# Patient Record
Sex: Female | Born: 1935
Health system: Southern US, Community
[De-identification: ages and names within clinical notes are randomized; demographics above are authoritative.]

## PROBLEM LIST (undated history)

## (undated) DIAGNOSIS — G40309 Generalized idiopathic epilepsy and epileptic syndromes, not intractable, without status epilepticus: Secondary | ICD-10-CM

## (undated) DIAGNOSIS — R000 Tachycardia, unspecified: Secondary | ICD-10-CM

## (undated) DIAGNOSIS — R42 Dizziness and giddiness: Secondary | ICD-10-CM

## (undated) DIAGNOSIS — C801 Malignant (primary) neoplasm, unspecified: Secondary | ICD-10-CM

## (undated) DIAGNOSIS — C439 Malignant melanoma of skin, unspecified: Secondary | ICD-10-CM

## (undated) DIAGNOSIS — R569 Unspecified convulsions: Secondary | ICD-10-CM

## (undated) DIAGNOSIS — G4700 Insomnia, unspecified: Secondary | ICD-10-CM

## (undated) DIAGNOSIS — F1010 Alcohol abuse, uncomplicated: Secondary | ICD-10-CM

## (undated) DIAGNOSIS — F329 Major depressive disorder, single episode, unspecified: Secondary | ICD-10-CM

## (undated) DIAGNOSIS — F17200 Nicotine dependence, unspecified, uncomplicated: Secondary | ICD-10-CM

## (undated) DIAGNOSIS — E785 Hyperlipidemia, unspecified: Secondary | ICD-10-CM

## (undated) DIAGNOSIS — C169 Malignant neoplasm of stomach, unspecified: Secondary | ICD-10-CM

## (undated) DIAGNOSIS — N951 Menopausal and female climacteric states: Secondary | ICD-10-CM

## (undated) DIAGNOSIS — R410 Disorientation, unspecified: Secondary | ICD-10-CM

## (undated) DIAGNOSIS — R609 Edema, unspecified: Secondary | ICD-10-CM

## (undated) DIAGNOSIS — I749 Embolism and thrombosis of unspecified artery: Secondary | ICD-10-CM

## (undated) DIAGNOSIS — K219 Gastro-esophageal reflux disease without esophagitis: Secondary | ICD-10-CM

## (undated) DIAGNOSIS — R3915 Urgency of urination: Secondary | ICD-10-CM

## (undated) DIAGNOSIS — J449 Chronic obstructive pulmonary disease, unspecified: Secondary | ICD-10-CM

## (undated) DIAGNOSIS — I10 Essential (primary) hypertension: Secondary | ICD-10-CM

## (undated) DIAGNOSIS — M869 Osteomyelitis, unspecified: Secondary | ICD-10-CM

## (undated) DIAGNOSIS — F419 Anxiety disorder, unspecified: Secondary | ICD-10-CM

## (undated) DIAGNOSIS — I739 Peripheral vascular disease, unspecified: Secondary | ICD-10-CM

## (undated) DIAGNOSIS — I4891 Unspecified atrial fibrillation: Secondary | ICD-10-CM

## (undated) HISTORY — DX: Chronic obstructive pulmonary disease, unspecified: J44.9

## (undated) HISTORY — DX: Tachycardia, unspecified: R00.0

## (undated) HISTORY — DX: Disorientation, unspecified: R41.0

## (undated) HISTORY — DX: Major depressive disorder, single episode, unspecified: F32.9

## (undated) HISTORY — DX: Alcohol abuse, uncomplicated: F10.10

## (undated) HISTORY — DX: Urgency of urination: R39.15

## (undated) HISTORY — DX: Generalized idiopathic epilepsy and epileptic syndromes, not intractable, without status epilepticus: G40.309

## (undated) HISTORY — PX: BLADDER REPAIR: SHX76

## (undated) HISTORY — DX: Malignant neoplasm of stomach, unspecified: C16.9

## (undated) HISTORY — DX: Menopausal and female climacteric states: N95.1

## (undated) HISTORY — DX: Insomnia, unspecified: G47.00

## (undated) HISTORY — PX: HEMORRHOID SURGERY: SHX153

## (undated) HISTORY — DX: Dizziness and giddiness: R42

## (undated) HISTORY — DX: Nicotine dependence, unspecified, uncomplicated: F17.200

## (undated) HISTORY — PX: OTHER SURGICAL HISTORY: SHX169

## (undated) HISTORY — DX: Edema, unspecified: R60.9

## (undated) HISTORY — DX: Unspecified atrial fibrillation: I48.91

## (undated) HISTORY — PX: VOCAL CORD INJECTION: SHX2663

---

## 1961-09-29 HISTORY — PX: ABDOMINAL HYSTERECTOMY: SHX81

## 1993-09-29 HISTORY — PX: OTHER SURGICAL HISTORY: SHX169

## 1993-09-29 HISTORY — PX: COLON RESECTION: SHX5231

## 1994-09-29 DIAGNOSIS — I749 Embolism and thrombosis of unspecified artery: Secondary | ICD-10-CM

## 1994-09-29 HISTORY — PX: OTHER SURGICAL HISTORY: SHX169

## 1994-09-29 HISTORY — DX: Embolism and thrombosis of unspecified artery: I74.9

## 1995-09-30 HISTORY — PX: OTHER SURGICAL HISTORY: SHX169

## 2004-05-07 HISTORY — PX: OTHER SURGICAL HISTORY: SHX169

## 2004-09-06 HISTORY — PX: OTHER SURGICAL HISTORY: SHX169

## 2004-09-29 HISTORY — PX: OTHER SURGICAL HISTORY: SHX169

## 2004-12-05 HISTORY — PX: OTHER SURGICAL HISTORY: SHX169

## 2005-03-27 ENCOUNTER — Emergency Department (HOSPITAL_COMMUNITY): Admission: EM | Admit: 2005-03-27 | Discharge: 2005-03-27 | Payer: Self-pay | Admitting: Emergency Medicine

## 2005-03-27 HISTORY — PX: OTHER SURGICAL HISTORY: SHX169

## 2005-04-09 ENCOUNTER — Encounter: Admission: RE | Admit: 2005-04-09 | Discharge: 2005-04-09 | Payer: Self-pay | Admitting: Orthopedic Surgery

## 2005-04-10 ENCOUNTER — Ambulatory Visit (HOSPITAL_BASED_OUTPATIENT_CLINIC_OR_DEPARTMENT_OTHER): Admission: RE | Admit: 2005-04-10 | Discharge: 2005-04-10 | Payer: Self-pay | Admitting: Orthopedic Surgery

## 2005-04-10 ENCOUNTER — Ambulatory Visit (HOSPITAL_COMMUNITY): Admission: RE | Admit: 2005-04-10 | Discharge: 2005-04-10 | Payer: Self-pay | Admitting: Orthopedic Surgery

## 2005-04-24 DIAGNOSIS — R42 Dizziness and giddiness: Secondary | ICD-10-CM

## 2005-04-24 HISTORY — DX: Dizziness and giddiness: R42

## 2007-10-04 HISTORY — PX: OTHER SURGICAL HISTORY: SHX169

## 2008-12-22 ENCOUNTER — Observation Stay (HOSPITAL_COMMUNITY): Admission: EM | Admit: 2008-12-22 | Discharge: 2008-12-22 | Payer: Self-pay | Admitting: Emergency Medicine

## 2008-12-22 DIAGNOSIS — M869 Osteomyelitis, unspecified: Secondary | ICD-10-CM

## 2008-12-22 HISTORY — DX: Osteomyelitis, unspecified: M86.9

## 2009-01-02 ENCOUNTER — Encounter: Admission: RE | Admit: 2009-01-02 | Discharge: 2009-01-02 | Payer: Self-pay | Admitting: Neurology

## 2009-03-21 ENCOUNTER — Emergency Department (HOSPITAL_COMMUNITY): Admission: EM | Admit: 2009-03-21 | Discharge: 2009-03-22 | Payer: Self-pay | Admitting: Emergency Medicine

## 2009-03-22 ENCOUNTER — Inpatient Hospital Stay (HOSPITAL_COMMUNITY): Admission: RE | Admit: 2009-03-22 | Discharge: 2009-03-22 | Payer: Self-pay | Admitting: *Deleted

## 2009-03-22 ENCOUNTER — Ambulatory Visit: Payer: Self-pay | Admitting: *Deleted

## 2010-02-05 ENCOUNTER — Emergency Department (HOSPITAL_COMMUNITY): Admission: EM | Admit: 2010-02-05 | Discharge: 2010-02-05 | Payer: Self-pay | Admitting: Emergency Medicine

## 2010-04-01 ENCOUNTER — Emergency Department (HOSPITAL_COMMUNITY): Admission: EM | Admit: 2010-04-01 | Discharge: 2010-04-01 | Payer: Self-pay | Admitting: Emergency Medicine

## 2010-06-08 ENCOUNTER — Emergency Department (HOSPITAL_COMMUNITY)
Admission: EM | Admit: 2010-06-08 | Discharge: 2010-06-08 | Payer: Self-pay | Source: Home / Self Care | Admitting: Emergency Medicine

## 2010-08-16 ENCOUNTER — Emergency Department (HOSPITAL_COMMUNITY): Admission: EM | Admit: 2010-08-16 | Discharge: 2010-08-16 | Payer: Self-pay | Admitting: Emergency Medicine

## 2010-08-21 ENCOUNTER — Emergency Department (HOSPITAL_COMMUNITY): Admission: EM | Admit: 2010-08-21 | Discharge: 2010-08-21 | Payer: Self-pay | Admitting: Emergency Medicine

## 2010-10-23 ENCOUNTER — Other Ambulatory Visit: Payer: Self-pay | Admitting: General Surgery

## 2010-10-23 ENCOUNTER — Other Ambulatory Visit
Admission: RE | Admit: 2010-10-23 | Discharge: 2010-10-23 | Payer: Self-pay | Source: Home / Self Care | Admitting: General Surgery

## 2010-10-29 ENCOUNTER — Other Ambulatory Visit (HOSPITAL_COMMUNITY): Payer: Self-pay | Admitting: General Surgery

## 2010-10-29 DIAGNOSIS — C439 Malignant melanoma of skin, unspecified: Secondary | ICD-10-CM

## 2010-10-31 ENCOUNTER — Other Ambulatory Visit (HOSPITAL_COMMUNITY): Payer: Self-pay | Admitting: General Surgery

## 2010-10-31 ENCOUNTER — Encounter (HOSPITAL_COMMUNITY): Payer: Self-pay

## 2010-10-31 ENCOUNTER — Ambulatory Visit (HOSPITAL_COMMUNITY)
Admission: RE | Admit: 2010-10-31 | Discharge: 2010-10-31 | Disposition: A | Discharge status: Home / Self Care | Payer: Medicare Other | Source: Ambulatory Visit | Attending: General Surgery | Admitting: General Surgery

## 2010-10-31 DIAGNOSIS — Z85038 Personal history of other malignant neoplasm of large intestine: Secondary | ICD-10-CM | POA: Insufficient documentation

## 2010-10-31 DIAGNOSIS — C439 Malignant melanoma of skin, unspecified: Secondary | ICD-10-CM

## 2010-10-31 DIAGNOSIS — C4370 Malignant melanoma of unspecified lower limb, including hip: Secondary | ICD-10-CM | POA: Insufficient documentation

## 2010-10-31 DIAGNOSIS — C774 Secondary and unspecified malignant neoplasm of inguinal and lower limb lymph nodes: Secondary | ICD-10-CM | POA: Insufficient documentation

## 2010-10-31 DIAGNOSIS — Z9221 Personal history of antineoplastic chemotherapy: Secondary | ICD-10-CM | POA: Insufficient documentation

## 2010-10-31 DIAGNOSIS — J984 Other disorders of lung: Secondary | ICD-10-CM | POA: Insufficient documentation

## 2010-10-31 HISTORY — DX: Malignant (primary) neoplasm, unspecified: C80.1

## 2010-10-31 LAB — GLUCOSE, CAPILLARY: Glucose-Capillary: 100 mg/dL — ABNORMAL HIGH (ref 70–99)

## 2010-10-31 MED ORDER — FLUDEOXYGLUCOSE F - 18 (FDG) INJECTION: 18.1000 | Freq: Once | INTRAVENOUS | Status: AC | PRN

## 2010-11-11 DIAGNOSIS — F329 Major depressive disorder, single episode, unspecified: Secondary | ICD-10-CM

## 2010-11-11 DIAGNOSIS — F1010 Alcohol abuse, uncomplicated: Secondary | ICD-10-CM

## 2010-11-11 DIAGNOSIS — C169 Malignant neoplasm of stomach, unspecified: Secondary | ICD-10-CM

## 2010-11-11 DIAGNOSIS — N951 Menopausal and female climacteric states: Secondary | ICD-10-CM

## 2010-11-11 DIAGNOSIS — I4891 Unspecified atrial fibrillation: Secondary | ICD-10-CM

## 2010-11-11 DIAGNOSIS — G4700 Insomnia, unspecified: Secondary | ICD-10-CM

## 2010-11-11 HISTORY — DX: Malignant neoplasm of stomach, unspecified: C16.9

## 2010-11-11 HISTORY — DX: Menopausal and female climacteric states: N95.1

## 2010-11-11 HISTORY — DX: Unspecified atrial fibrillation: I48.91

## 2010-11-11 HISTORY — DX: Major depressive disorder, single episode, unspecified: F32.9

## 2010-11-11 HISTORY — DX: Insomnia, unspecified: G47.00

## 2010-11-11 HISTORY — DX: Alcohol abuse, uncomplicated: F10.10

## 2010-11-15 HISTORY — PX: OTHER SURGICAL HISTORY: SHX169

## 2010-12-10 LAB — URINALYSIS, ROUTINE W REFLEX MICROSCOPIC
Glucose, UA: NEGATIVE mg/dL
Ketones, ur: NEGATIVE mg/dL
Nitrite: NEGATIVE
pH: 6.5 (ref 5.0–8.0)

## 2010-12-10 LAB — DIFFERENTIAL
Basophils Relative: 0 % (ref 0–1)
Eosinophils Absolute: 0 10*3/uL (ref 0.0–0.7)
Eosinophils Relative: 0 % (ref 0–5)
Lymphs Abs: 1.2 10*3/uL (ref 0.7–4.0)
Monocytes Absolute: 0.4 10*3/uL (ref 0.1–1.0)
Monocytes Relative: 4 % (ref 3–12)
Neutrophils Relative %: 86 % — ABNORMAL HIGH (ref 43–77)

## 2010-12-10 LAB — BASIC METABOLIC PANEL
BUN: 10 mg/dL (ref 6–23)
Chloride: 102 mEq/L (ref 96–112)
Creatinine, Ser: 0.79 mg/dL (ref 0.4–1.2)
GFR calc Af Amer: >60 mL/min (ref >60)
GFR calc non Af Amer: >60 mL/min (ref >60)
Potassium: 4.2 mEq/L (ref 3.5–5.1)

## 2010-12-10 LAB — CBC
HCT: 44.2 % (ref 36.0–46.0)
Hemoglobin: 14.8 g/dL (ref 12.0–15.0)
MCH: 30.7 pg (ref 26.0–34.0)
MCHC: 33.5 g/dL (ref 30.0–36.0)
MCV: 91.4 fL (ref 78.0–100.0)
RBC: 4.83 MIL/uL (ref 3.87–5.11)

## 2010-12-12 LAB — URINALYSIS, ROUTINE W REFLEX MICROSCOPIC
Bilirubin Urine: NEGATIVE
Glucose, UA: NEGATIVE mg/dL
Hgb urine dipstick: NEGATIVE
Ketones, ur: NEGATIVE mg/dL
Protein, ur: NEGATIVE mg/dL
Urobilinogen, UA: 1 mg/dL (ref 0.0–1.0)

## 2010-12-12 LAB — COMPREHENSIVE METABOLIC PANEL
ALT: 13 U/L (ref 0–35)
AST: 19 U/L (ref 0–37)
Albumin: 4.1 g/dL (ref 3.5–5.2)
CO2: 26 mEq/L (ref 19–32)
Calcium: 9.2 mg/dL (ref 8.4–10.5)
Chloride: 104 mEq/L (ref 96–112)
Creatinine, Ser: 0.87 mg/dL (ref 0.4–1.2)
GFR calc Af Amer: >60 mL/min (ref >60)
GFR calc non Af Amer: >60 mL/min (ref >60)
Sodium: 139 mEq/L (ref 135–145)
Total Bilirubin: 0.6 mg/dL (ref 0.3–1.2)

## 2010-12-12 LAB — CBC
Hemoglobin: 15.1 g/dL — ABNORMAL HIGH (ref 12.0–15.0)
MCH: 30.3 pg (ref 26.0–34.0)
Platelets: 265 10*3/uL (ref 150–400)
RBC: 4.98 MIL/uL (ref 3.87–5.11)

## 2010-12-12 LAB — URINE CULTURE: Colony Count: 10000

## 2010-12-12 LAB — DIFFERENTIAL
Eosinophils Absolute: 0 10*3/uL (ref 0.0–0.7)
Eosinophils Relative: 0 % (ref 0–5)
Lymphocytes Relative: 14 % (ref 12–46)
Lymphs Abs: 1.3 10*3/uL (ref 0.7–4.0)
Monocytes Absolute: 0.5 10*3/uL (ref 0.1–1.0)

## 2010-12-12 LAB — CULTURE, BLOOD (ROUTINE X 2): Culture: NO GROWTH

## 2010-12-12 LAB — LACTIC ACID, PLASMA: Lactic Acid, Venous: 1.3 mmol/L (ref 0.5–2.2)

## 2010-12-12 LAB — APTT: aPTT: 27 seconds (ref 24–37)

## 2010-12-17 LAB — POCT I-STAT, CHEM 8
BUN: 15 mg/dL (ref 6–23)
Calcium, Ion: 1.16 mmol/L (ref 1.12–1.32)
Chloride: 105 mEq/L (ref 96–112)
Creatinine, Ser: 0.9 mg/dL (ref 0.4–1.2)
Glucose, Bld: 109 mg/dL — ABNORMAL HIGH (ref 70–99)
HCT: 43 % (ref 36.0–46.0)
Potassium: 4 mEq/L (ref 3.5–5.1)

## 2011-01-06 LAB — RAPID URINE DRUG SCREEN, HOSP PERFORMED
Amphetamines: NOT DETECTED
Benzodiazepines: POSITIVE — AB
Cocaine: NOT DETECTED
Tetrahydrocannabinol: NOT DETECTED

## 2011-01-06 LAB — CBC
HCT: 45.4 % (ref 36.0–46.0)
MCV: 90.8 fL (ref 78.0–100.0)
Platelets: 282 10*3/uL (ref 150–400)
RDW: 15.2 % (ref 11.5–15.5)

## 2011-01-06 LAB — URINALYSIS, ROUTINE W REFLEX MICROSCOPIC
Glucose, UA: NEGATIVE mg/dL
Ketones, ur: NEGATIVE mg/dL
Protein, ur: NEGATIVE mg/dL
Urobilinogen, UA: 0.2 mg/dL (ref 0.0–1.0)

## 2011-01-06 LAB — DIFFERENTIAL
Basophils Absolute: 0.1 10*3/uL (ref 0.0–0.1)
Eosinophils Absolute: 0 10*3/uL (ref 0.0–0.7)
Eosinophils Relative: 0 % (ref 0–5)
Lymphocytes Relative: 23 % (ref 12–46)

## 2011-01-06 LAB — BASIC METABOLIC PANEL
BUN: 7 mg/dL (ref 6–23)
Creatinine, Ser: 1.01 mg/dL (ref 0.4–1.2)
GFR calc non Af Amer: 54 mL/min — ABNORMAL LOW (ref >60)
Glucose, Bld: 102 mg/dL — ABNORMAL HIGH (ref 70–99)
Potassium: 4 mEq/L (ref 3.5–5.1)

## 2011-01-09 LAB — CBC
Platelets: 267 10*3/uL (ref 150–400)
WBC: 9.1 10*3/uL (ref 4.0–10.5)

## 2011-01-09 LAB — DIFFERENTIAL
Lymphocytes Relative: 22 % (ref 12–46)
Lymphs Abs: 2 10*3/uL (ref 0.7–4.0)
Neutro Abs: 6.2 10*3/uL (ref 1.7–7.7)
Neutrophils Relative %: 68 % (ref 43–77)

## 2011-01-09 LAB — SEDIMENTATION RATE: Sed Rate: 11 mm/hr (ref 0–22)

## 2011-01-31 ENCOUNTER — Emergency Department (INDEPENDENT_AMBULATORY_CARE_PROVIDER_SITE_OTHER): Payer: Medicare Other

## 2011-01-31 ENCOUNTER — Emergency Department (HOSPITAL_BASED_OUTPATIENT_CLINIC_OR_DEPARTMENT_OTHER)
Admission: EM | Admit: 2011-01-31 | Discharge: 2011-01-31 | Disposition: A | Discharge status: Home / Self Care | Payer: Medicare Other | Attending: Emergency Medicine | Admitting: Emergency Medicine

## 2011-01-31 DIAGNOSIS — G319 Degenerative disease of nervous system, unspecified: Secondary | ICD-10-CM | POA: Insufficient documentation

## 2011-01-31 DIAGNOSIS — Z79899 Other long term (current) drug therapy: Secondary | ICD-10-CM | POA: Insufficient documentation

## 2011-01-31 DIAGNOSIS — R569 Unspecified convulsions: Secondary | ICD-10-CM | POA: Insufficient documentation

## 2011-01-31 LAB — BASIC METABOLIC PANEL
BUN: 10 mg/dL (ref 6–23)
CO2: 24 mEq/L (ref 19–32)
Chloride: 102 mEq/L (ref 96–112)
Creatinine, Ser: 0.7 mg/dL (ref 0.4–1.2)
Glucose, Bld: 125 mg/dL — ABNORMAL HIGH (ref 70–99)

## 2011-01-31 LAB — CBC
HCT: 39.7 % (ref 36.0–46.0)
Hemoglobin: 13.2 g/dL (ref 12.0–15.0)
MCH: 28.1 pg (ref 26.0–34.0)
MCHC: 33.2 g/dL (ref 30.0–36.0)

## 2011-01-31 LAB — URINALYSIS, ROUTINE W REFLEX MICROSCOPIC
Glucose, UA: NEGATIVE mg/dL
Hgb urine dipstick: NEGATIVE
Ketones, ur: NEGATIVE mg/dL
Protein, ur: NEGATIVE mg/dL

## 2011-01-31 LAB — GLUCOSE, CAPILLARY: Glucose-Capillary: 130 mg/dL — ABNORMAL HIGH (ref 70–99)

## 2011-01-31 LAB — DIFFERENTIAL
Basophils Absolute: 0 10*3/uL (ref 0.0–0.1)
Eosinophils Relative: 0 % (ref 0–5)
Lymphocytes Relative: 7 % — ABNORMAL LOW (ref 12–46)
Neutro Abs: 8.7 10*3/uL — ABNORMAL HIGH (ref 1.7–7.7)

## 2011-02-01 LAB — URINE CULTURE

## 2011-02-14 NOTE — Op Note (Signed)
NAME:  Jessica [name], Jessica [name]          ACCOUNT NO.:  [id number]   MEDICAL RECORD NO.:  [id number]          PATIENT TYPE:  AMB   LOCATION:  DSC                          FACILITY:  [facility]   PHYSICIAN:  John L. Rendall, M.D.  DATE OF BIRTH:  [birth year]   DATE OF PROCEDURE:  04/10/2005  DATE OF DISCHARGE:                                 OPERATIVE REPORT   PREOPERATIVE DIAGNOSIS:  Displaced greater tuberosity fracture, left  shoulder.   SURGICAL PROCEDURE:  Open reduction internal fixation greater tuberosity  fracture.   POSTOPERATIVE DIAGNOSIS:  Open reduction internal fixation greater  tuberosity fracture.   SURGEON:  John L. Rendall, M.D.   ASSISTANT:  Rexene Edison, [facility]   ANESTHESIA:  General.   PATHOLOGY:  The patient is 2 weeks status post displaced greater tuberosity  fracture with dislocation of the shoulder. The shoulder dislocation was  reduced but the dislocation remained fairly significant. Consequently after  2 weeks of cleaning up her lungs from her two pack a day cigarette habit and  getting her checked medically, she is brought to the OR for open reduction  internal fixation.   PROCEDURE:  Under general anesthesia, the patient is placed semi-sitting  with a bump under the shoulder with the shoulder at the edge of the table. C-  arm is positioned ahead of time to determine optimal viewing. The shoulder  was then prepared with DuraPrep and draped as a sterile field. An  approximate 6 cm incision was made from the anterolateral acromion distally.  Dissection was carried through 4.5 cm of deltoid muscle. This was measured  and the deltoid fibers were spread. The fragment was immediately  encountered. It is displaced, abducted and posteriorly. It is elevated where  it scarring down in place at 2 weeks and the crater from which it came was  exposed and curetted. Fragment was then reduced and held with a Ace  cannulated distally-threaded K-wire. A second distally-threaded K-wire  is  placed and the two hold the fragment nicely reduced. A screw 85 mm distally-  threaded with washer was then placed in the superior portion of the fragment  and another one approximately 65 cm is used for the more distal K-wire.  These two hold the fragment solidly in place and excellent position and  alignment are seen on the C-arm pictures. At this point the deltoid was  reapproximated with 0 Vicryl after washing out the wound. The subcu was  closed with 2-0 Vicryl and skin with clips. Operative time approximately 1  hour. The patient tolerated the procedure well and returned to recovery in  good condition.       JLR/MEDQ  D:  04/10/2005  T:  04/10/2005  Job:  [id number]

## 2011-02-14 NOTE — Discharge Summary (Signed)
NAMEMarland [name]  Jessica [name] NO.:  [id number]   MEDICAL RECORD NO.:  [id number]          PATIENT TYPE:  IPS   LOCATION:  0304                          FACILITY:  [facility]   PHYSICIAN:  Jasmine Pang, M.D. DATE OF BIRTH:  12/07/1931   DATE OF ADMISSION:  03/22/2009  DATE OF DISCHARGE:  03/22/2009                               DISCHARGE SUMMARY   IDENTIFYING INFORMATION:  This is a 77 year old female, married.  This  is a voluntary admission.   HISTORY OF PRESENT ILLNESS:  This is a brief discharge summary for this  89 year old wife and mother of three who has been living with her  husband at a local retirement center.  She was brought in after  ambulance was called yesterday out of fears that she was agitated and  may harm herself.  She reports that she has been having some chronic  difficulties with adjusting to her husband's diagnosis of Alzheimer's  disease in the early stages.  Feels that he is at times difficult to get  along, admits they had been having some stress.  On the day of  admission, she apparently got frustrated, went off in the car for a  while, drove around, returned home.  Apparently she raised her voice to  him, was rather agitated.  He called an ambulance who came to the  retirement complex.  She admitted at that point that she had taken four  1 mg tablets in order to control her anxiety and agitation, and she was  referred for admission.  She was medically cleared in the emergency  room.  Today, she does endorse some anxiety, irritability.  Adamantly  denies any suicidal thoughts.  Admits that she was upset, had not been  sleeping real well and did take 4 mg of Ativan.  It is her understanding  that she is prescribed up to 3 mg per day.  She is compliant with her  medications.  Denies any desire to harm anyone.  Is interested in  pursuing outpatient counseling that she has been attending at the  [facility] and is scheduled for individual  appointment with Isabel Caprice at 10:00 a.m. on the morning of June 25.  No homicidal thoughts.   PAST PSYCHIATRIC HISTORY:  No prior admissions.  She has been treated  for some depression by her primary care physician and is currently  taking sertraline 50 mg daily.  Is prescribed Lamictal 200 mg b.i.d. for  seizures after being transitioned from Tegretol XR 600 mg daily by her  neurologist.  No history of suicide attempts.  No prior hospitalizations  or inpatient treatment.   SOCIAL HISTORY:  Married female, 77 years of age.  She has three grown  children.  Two live out of town and one lives here in the area.  Married  52 years.  Has previously worked in the past as a Catering manager, and for a  few years while her children were in college, she worked for [employer].  Husband is independent in ADLs.  No history of  substance abuse.   FAMILY HISTORY:  She denies  a family history of mental illness or  substance abuse.   PRIMARY CARE PRACTITIONER:  Dr. Benedetto Goad, MD at [facility]  [facility]icine.  She is followed by Dr. Alvester Morin at [facility] for her  neurologist for seizure disorder.   MEDICAL PROBLEMS:  1. History of seizures.  2. Hypertension.   CURRENT MEDICATIONS:  1. Diovan 80 mg daily.  2. Rhinocort Aqua 1-2 puffs daily p.r.n. for allergies.  3. Estraderm 0.05 mg transdermal two times weekly.  4. Nexium 40 mg b.i.d.  5. Zocor 40 mg daily.  6. Patanol one drop twice a day as needed for eye allergies.  7. Tegretol has been previously discontinued.  8. She is on Lamictal 200 mg b.i.d.  9. Ativan 1 mg, one half to one tablet p.o. b.i.d. p.r.n. for anxiety.   DRUG ALLERGIES:  ASPIRIN, EFFEXOR, LEVAQUIN AND PENICILLIN.   MENTAL STATUS EXAM:  Today reveals a fully alert female, appropriate,  good eye contact, fully alert, in full contact with reality.  Speech is  normal.  Gives a coherent history.  Insight good.  Asking to go home.  Denies any  dangerous thoughts.  Admits that she got upset yesterday.  Insight is adequate.  No suicidal or homicidal thought.  Mood is neutral  somewhat ashamed of her actions yesterday.  Cognition is fully  preserved.  Memory intact.   DISCHARGE DIAGNOSES:  AXIS I:  Depressive disorder NOS.  AXIS II:  Deferred.  AXIS III:  Hypertension by history.  AXIS IV:  Moderate chronic marital and caregiver issues.  AXIS V:  Current 60, past year 78.   PLAN:  Plan is to discharge her today.  We have spoken with her husband  who will drive over here and pick her up.  He feels that she is safe to  come home, would like to have her home.  She is interested in being  discharged today so she can keep her appointment in the morning with  Isabel Caprice scheduled at 10:00 a.m. [facility], her  psychotherapist.  No medication changes made here.  Discharge  medications are the same as admitting medications.      [name], N.P.      Jasmine Pang, M.D.  Electronically Signed    MAS/MEDQ  D:  03/26/2009  T:  03/26/2009  Job:  [id number]

## 2011-09-26 ENCOUNTER — Encounter (HOSPITAL_COMMUNITY): Payer: Self-pay | Admitting: Emergency Medicine

## 2011-09-26 ENCOUNTER — Emergency Department (HOSPITAL_COMMUNITY): Payer: Medicare Other

## 2011-09-26 ENCOUNTER — Other Ambulatory Visit: Payer: Self-pay

## 2011-09-26 ENCOUNTER — Inpatient Hospital Stay (HOSPITAL_COMMUNITY)
Admission: EM | Admit: 2011-09-26 | Discharge: 2011-09-27 | DRG: 203 | Disposition: A | Discharge status: Home / Self Care | Payer: Medicare Other | Attending: Internal Medicine | Admitting: Internal Medicine

## 2011-09-26 DIAGNOSIS — Z85820 Personal history of malignant melanoma of skin: Secondary | ICD-10-CM

## 2011-09-26 DIAGNOSIS — K219 Gastro-esophageal reflux disease without esophagitis: Secondary | ICD-10-CM | POA: Diagnosis present

## 2011-09-26 DIAGNOSIS — J449 Chronic obstructive pulmonary disease, unspecified: Secondary | ICD-10-CM | POA: Diagnosis present

## 2011-09-26 DIAGNOSIS — I739 Peripheral vascular disease, unspecified: Secondary | ICD-10-CM | POA: Diagnosis present

## 2011-09-26 DIAGNOSIS — E785 Hyperlipidemia, unspecified: Secondary | ICD-10-CM | POA: Diagnosis present

## 2011-09-26 DIAGNOSIS — Z720 Tobacco use: Secondary | ICD-10-CM | POA: Diagnosis present

## 2011-09-26 DIAGNOSIS — F17200 Nicotine dependence, unspecified, uncomplicated: Secondary | ICD-10-CM | POA: Diagnosis present

## 2011-09-26 DIAGNOSIS — J40 Bronchitis, not specified as acute or chronic: Principal | ICD-10-CM | POA: Diagnosis present

## 2011-09-26 DIAGNOSIS — Z66 Do not resuscitate: Secondary | ICD-10-CM | POA: Diagnosis present

## 2011-09-26 DIAGNOSIS — I10 Essential (primary) hypertension: Secondary | ICD-10-CM | POA: Diagnosis present

## 2011-09-26 DIAGNOSIS — R0602 Shortness of breath: Secondary | ICD-10-CM

## 2011-09-26 DIAGNOSIS — R509 Fever, unspecified: Secondary | ICD-10-CM | POA: Diagnosis present

## 2011-09-26 DIAGNOSIS — J4489 Other specified chronic obstructive pulmonary disease: Secondary | ICD-10-CM | POA: Diagnosis present

## 2011-09-26 DIAGNOSIS — R197 Diarrhea, unspecified: Secondary | ICD-10-CM | POA: Diagnosis present

## 2011-09-26 DIAGNOSIS — Z85038 Personal history of other malignant neoplasm of large intestine: Secondary | ICD-10-CM

## 2011-09-26 HISTORY — DX: Anxiety disorder, unspecified: F41.9

## 2011-09-26 HISTORY — DX: Embolism and thrombosis of unspecified artery: I74.9

## 2011-09-26 HISTORY — DX: Malignant melanoma of skin, unspecified: C43.9

## 2011-09-26 HISTORY — DX: Hyperlipidemia, unspecified: E78.5

## 2011-09-26 HISTORY — DX: Osteomyelitis, unspecified: M86.9

## 2011-09-26 HISTORY — DX: Unspecified convulsions: R56.9

## 2011-09-26 HISTORY — DX: Gastro-esophageal reflux disease without esophagitis: K21.9

## 2011-09-26 HISTORY — DX: Essential (primary) hypertension: I10

## 2011-09-26 HISTORY — DX: Peripheral vascular disease, unspecified: I73.9

## 2011-09-26 LAB — PROCALCITONIN: Procalcitonin: <0.1 ng/mL

## 2011-09-26 LAB — BASIC METABOLIC PANEL
Chloride: 101 mEq/L (ref 96–112)
Creatinine, Ser: 0.7 mg/dL (ref 0.50–1.10)
GFR calc Af Amer: >90 mL/min (ref >90)
Potassium: 4.1 mEq/L (ref 3.5–5.1)

## 2011-09-26 LAB — INFLUENZA PANEL BY PCR (TYPE A & B)
Influenza A By PCR: POSITIVE — AB
Influenza B By PCR: NEGATIVE

## 2011-09-26 LAB — CBC
HCT: 42.1 % (ref 36.0–46.0)
Hemoglobin: 13.8 g/dL (ref 12.0–15.0)
RDW: 14.8 % (ref 11.5–15.5)
WBC: 6.5 10*3/uL (ref 4.0–10.5)

## 2011-09-26 LAB — EXPECTORATED SPUTUM ASSESSMENT W GRAM STAIN, RFLX TO RESP C

## 2011-09-26 LAB — PROTIME-INR: INR: 1.01 (ref 0.00–1.49)

## 2011-09-26 LAB — TROPONIN I: Troponin I: <0.3 ng/mL (ref <0.30)

## 2011-09-26 LAB — LACTIC ACID, PLASMA: Lactic Acid, Venous: 1.5 mmol/L (ref 0.5–2.2)

## 2011-09-26 MED ORDER — PANTOPRAZOLE SODIUM 40 MG PO TBEC
40.0000 mg | DELAYED_RELEASE_TABLET | Freq: Every day | ORAL | Status: DC
  Administered 2011-09-26 – 2011-09-27 (×2): 40 mg via ORAL
  Filled 2011-09-26 (×3): qty 1

## 2011-09-26 MED ORDER — VITAMIN D3 25 MCG (1000 UNIT) PO TABS
2000.0000 [IU] | ORAL_TABLET | Freq: Every day | ORAL | Status: DC
  Administered 2011-09-26: 2000 [IU] via ORAL
  Filled 2011-09-26 (×2): qty 2

## 2011-09-26 MED ORDER — LORAZEPAM 1 MG PO TABS
1.0000 mg | ORAL_TABLET | Freq: Two times a day (BID) | ORAL | Status: DC
  Administered 2011-09-26: 1 mg via ORAL
  Filled 2011-09-26 (×2): qty 1

## 2011-09-26 MED ORDER — ONDANSETRON HCL 4 MG/2ML IJ SOLN: 4.0000 mg | Freq: Four times a day (QID) | INTRAMUSCULAR | Status: DC | PRN

## 2011-09-26 MED ORDER — OSELTAMIVIR PHOSPHATE 75 MG PO CAPS
75.0000 mg | ORAL_CAPSULE | Freq: Two times a day (BID) | ORAL | Status: DC
  Filled 2011-09-26 (×5): qty 1

## 2011-09-26 MED ORDER — METHYLPREDNISOLONE SODIUM SUCC 125 MG IJ SOLR
INTRAMUSCULAR | Status: AC
  Administered 2011-09-26: 11:00:00
  Filled 2011-09-26: qty 2

## 2011-09-26 MED ORDER — IPRATROPIUM BROMIDE 0.02 % IN SOLN
RESPIRATORY_TRACT | Status: AC
  Administered 2011-09-26: 11:00:00
  Filled 2011-09-26: qty 2.5

## 2011-09-26 MED ORDER — ALBUTEROL SULFATE (5 MG/ML) 0.5% IN NEBU
INHALATION_SOLUTION | RESPIRATORY_TRACT | Status: AC
  Administered 2011-09-26: 11:00:00
  Filled 2011-09-26: qty 2

## 2011-09-26 MED ORDER — ACETAMINOPHEN 325 MG PO TABS: 650.0000 mg | ORAL_TABLET | Freq: Four times a day (QID) | ORAL | Status: DC | PRN

## 2011-09-26 MED ORDER — SODIUM CHLORIDE 0.9 % IV SOLN: INTRAVENOUS | Status: AC

## 2011-09-26 MED ORDER — CALCIUM CARBONATE 1250 (500 CA) MG PO TABS
500.0000 mg | ORAL_TABLET | Freq: Every day | ORAL | Status: DC
  Administered 2011-09-26: 500 mg via ORAL
  Filled 2011-09-26 (×2): qty 1

## 2011-09-26 MED ORDER — OLMESARTAN 10 MG HALF TABLET
10.0000 mg | ORAL_TABLET | Freq: Every day | ORAL | Status: DC
  Administered 2011-09-26 – 2011-09-27 (×2): 10 mg via ORAL
  Filled 2011-09-26 (×3): qty 1

## 2011-09-26 MED ORDER — METOPROLOL SUCCINATE ER 50 MG PO TB24
50.0000 mg | ORAL_TABLET | Freq: Every day | ORAL | Status: DC
  Administered 2011-09-26 – 2011-09-27 (×2): 50 mg via ORAL
  Filled 2011-09-26 (×3): qty 1

## 2011-09-26 MED ORDER — SIMVASTATIN 20 MG PO TABS
20.0000 mg | ORAL_TABLET | Freq: Every day | ORAL | Status: DC
  Administered 2011-09-26: 20 mg via ORAL
  Filled 2011-09-26 (×3): qty 1

## 2011-09-26 MED ORDER — OXYCODONE HCL 5 MG PO TABS: 5.0000 mg | ORAL_TABLET | ORAL | Status: DC | PRN

## 2011-09-26 MED ORDER — LEVOFLOXACIN IN D5W 500 MG/100ML IV SOLN
500.0000 mg | INTRAVENOUS | Status: DC
  Administered 2011-09-26: 500 mg via INTRAVENOUS
  Filled 2011-09-26 (×2): qty 100

## 2011-09-26 MED ORDER — LAMOTRIGINE 150 MG PO TABS
150.0000 mg | ORAL_TABLET | Freq: Every day | ORAL | Status: DC
  Administered 2011-09-27: 150 mg via ORAL
  Filled 2011-09-26 (×2): qty 1

## 2011-09-26 MED ORDER — ZONISAMIDE 100 MG PO CAPS
100.0000 mg | ORAL_CAPSULE | Freq: Every day | ORAL | Status: DC
  Administered 2011-09-26 – 2011-09-27 (×2): 100 mg via ORAL
  Filled 2011-09-26 (×3): qty 1

## 2011-09-26 MED ORDER — ONDANSETRON HCL 4 MG PO TABS: 4.0000 mg | ORAL_TABLET | Freq: Four times a day (QID) | ORAL | Status: DC | PRN

## 2011-09-26 MED ORDER — ENOXAPARIN SODIUM 40 MG/0.4ML ~~LOC~~ SOLN
40.0000 mg | SUBCUTANEOUS | Status: DC
  Filled 2011-09-26 (×3): qty 0.4

## 2011-09-26 MED ORDER — SODIUM CHLORIDE 0.9 % IV SOLN
INTRAVENOUS | Status: DC
  Administered 2011-09-26: 18:00:00 via INTRAVENOUS
  Administered 2011-09-27: 1000 mL via INTRAVENOUS

## 2011-09-26 MED ORDER — OLOPATADINE HCL 0.1 % OP SOLN
1.0000 [drp] | Freq: Every day | OPHTHALMIC | Status: DC
  Administered 2011-09-26: 1 [drp] via OPHTHALMIC
  Filled 2011-09-26 (×2): qty 5

## 2011-09-26 MED ORDER — MIRTAZAPINE 15 MG PO TABS
15.0000 mg | ORAL_TABLET | Freq: Every day | ORAL | Status: DC
  Filled 2011-09-26 (×3): qty 1

## 2011-09-26 MED ORDER — LAMOTRIGINE 200 MG PO TABS
200.0000 mg | ORAL_TABLET | Freq: Every day | ORAL | Status: DC
  Administered 2011-09-26: 200 mg via ORAL
  Filled 2011-09-26 (×3): qty 1

## 2011-09-26 MED ORDER — ACETAMINOPHEN 650 MG RE SUPP: 650.0000 mg | Freq: Four times a day (QID) | RECTAL | Status: DC | PRN

## 2011-09-26 MED ORDER — METHYLPREDNISOLONE SODIUM SUCC 40 MG IJ SOLR
40.0000 mg | Freq: Two times a day (BID) | INTRAMUSCULAR | Status: DC
  Administered 2011-09-27 (×2): 40 mg via INTRAVENOUS
  Filled 2011-09-26 (×3): qty 1

## 2011-09-26 NOTE — ED Notes (Signed)
Changed from O2- mask at 4 to [place] at 2l/m

## 2011-09-26 NOTE — H&P (Addendum)
PCP:    Chief Complaint:  Sob, cough and fever since Monday.  HPI: 77 year old lady from [facility] came in for worsening sob, cough with clear sputum, fever. Initially she was hypoxic requiring 50% of oxygen, currently she is on nasal oxygen saturating 98%. cxr does not show pneumonia. She also has diarrhea for more than a week, loose watery,non foul smelling. No associated nausea or vomiting or abdominal pain. Denies any chest pain, headache, blurry vision, dizziness. Denies any burning micturition.   Review of Systems:  The patient denies anorexia, weight loss,, vision loss, decreased hearing, hoarseness, chest pain, syncope,  peripheral edema, balance deficits, hemoptysis, abdominal pain, melena, hematochezia, severe indigestion/heartburn, hematuria, incontinence, genital sores, muscle weakness, suspicious skin lesions, transient blindness, difficulty walking, depression, unusual weight change, abnormal bleeding, enlarged lymph nodes, angioedema, and breast masses.  Past Medical History: Past Medical History  Diagnosis Date  . Seizures   . GERD (gastroesophageal reflux disease)   . Peripheral vascular disease   . Anxiety   . Hyperlipidemia   . Hypertension   . Cancer     colon cancer-hx of chemo  . Embolism - blood clot 1996    left breast  . Bone infection of left hand 12/22/2008    secondary to injury  . Melanoma     metastatic  . Hemorrhoids    Past Surgical History  Procedure Date  . C-sections 1957, 1959, 1961    x3  . Abdominal hysterectomy 1963  . Colon resection 1995  . Vocal cord injection 1996, 1997  . Breast biopsies     x2 on left, x 1 on right  . Hemorrhoid surgery   . Porta cath placement 1995  . Porta cath removal 1996  . Heart ablation 1997  . Bladder repair   . Left femoral artery stent 05/07/2004  . Melanoma removed from left leg 09/06/2004  . Melanoma removed from left groin 09/2004  . Skin graft to left leg 12/05/2004  . Orif left humerus fracture  03/27/2005  . Laser surgery to right eye 10/04/2007  . Removal of tumor in left knee 11/15/2010    Medications: Prior to Admission medications   Medication Sig Start Date End Date Taking? Authorizing Provider  cholecalciferol (VITAMIN D) 1000 UNITS tablet Take 2,000 Units by mouth daily.     Yes Historical Provider, MD  esomeprazole (NEXIUM) 40 MG capsule Take 40 mg by mouth 2 (two) times daily.     Yes Historical Provider, MD  estradiol (VIVELLE-DOT) 0.05 MG/24HR Place 1 patch onto the skin once a week.     Yes Historical Provider, MD  lamoTRIgine (LAMICTAL) 150 MG tablet Take 150-200 mg by mouth 2 (two) times daily. Take 150mg  every morning and 200mg  every afternoon.    Yes Historical Provider, MD  LORazepam (ATIVAN) 1 MG tablet Take 1 mg by mouth 2 (two) times daily.     Yes Historical Provider, MD  metoprolol (TOPROL-XL) 50 MG 24 hr tablet Take 50 mg by mouth daily.     Yes Historical Provider, MD  olopatadine (PATANOL) 0.1 % ophthalmic solution Place 1 drop into both eyes daily.     Yes Historical Provider, MD  Ethelda Chick (OYSTER CALCIUM PO) Take 1,500 mg by mouth 2 (two) times daily.     Yes Historical Provider, MD  simvastatin (ZOCOR) 20 MG tablet Take 20 mg by mouth at bedtime.     Yes Historical Provider, MD  valsartan (DIOVAN) 160 MG tablet Take 160 mg  by mouth daily.     Yes Historical Provider, MD  zonisamide (ZONEGRAN) 100 MG capsule Take 100 mg by mouth daily.     Yes Historical Provider, MD    Allergies:   Allergies  Allergen Reactions  . Aspirin   . Venlafaxine Other (See Comments)    seizures  . Penicillins Rash  . Zithromax (Azithromycin) Rash    Social History:  reports that she has been smoking.  She has never used smokeless tobacco. She reports that she does not drink alcohol or use illicit drugs.  Family History: History reviewed. No pertinent family history.  Physical Exam: Filed Vitals:   09/26/11 1125  BP: 152/68  Pulse: 108  Temp: 98.5 F (36.9 C)    TempSrc: Axillary  Resp: 28  SpO2: 100%   Constitutional: Vital signs reviewed.  Patient is a slightly malnutrition ed, in no acute distress and cooperative with exam. Alert and oriented x3.  Head: Normocephalic and atraumatic Mouth: no erythema or exudates, dry mucous membranes Eyes: PERRL, EOMI, conjunctivae normal, No scleral icterus.  Neck: Supple, Trachea midline normal ROM, No JVD, mass,  Cardiovascular: RRR, S1 normal, S2 normal, no MRG, pulses symmetric and intact bilaterally Pulmonary/Chest: bilateral rhonchi present. Abdominal: Soft. Non-tender, non-distended, bowel sounds are normal, no masses, organomegaly, or guarding present.  Musculoskeletal: No joint deformities, erythema, or stiffness, ROM full and no nontender Neurological: A&O x3, Strenght is normal and symmetric bilaterally, cranial nerve II-XII are grossly intact, no focal motor deficit, sensory intact to light touch bilaterally.  Skin: Warm, dry and intact. No rash, cyanosis, or clubbing.    Labs on Admission:    [facility] 09/26/11 1155  NA 136  K 4.1  CL 101  CO2 23  GLUCOSE 113*  BUN 18  CREATININE 0.70  CALCIUM 8.8  MG --  PHOS --   No results found for this basename: AST:2,ALT:2,ALKPHOS:2,BILITOT:2,PROT:2,ALBUMIN:2 in the last 72 hours No results found for this basename: LIPASE:2,AMYLASE:2 in the last 72 hours  Basename 09/26/11 1155  WBC 6.5  NEUTROABS --  HGB 13.8  HCT 42.1  MCV 86.8  PLT 130*    Basename 09/26/11 1255  CKTOTAL --  CKMB --  CKMBINDEX --  TROPONINI <0.30   No results found for this basename: TSH,T4TOTAL,FREET3,T3FREE,THYROIDAB in the last 72 hours No results found for this basename: VITAMINB12:2,FOLATE:2,FERRITIN:2,TIBC:2,IRON:2,RETICCTPCT:2 in the last 72 hours  Radiological Exams on Admission: Dg Chest 1 View  09/26/2011  *RADIOLOGY REPORT*  Clinical Data: Shortness of breath.  Cough.  CHEST - 1 VIEW  Comparison: Chest x-ray 08/21/2010 and PET scan 10/31/2010.   Findings: The heart size is normal.  A granuloma in the right upper lobe is stable.  Mild interstitial coarsening is chronic.  No focal airspace disease is evident.  IMPRESSION:  1.  No acute cardiopulmonary disease or significant interval change.  Original Report Authenticated By: Jamesetta Orleans. MATTERN, M.D.    Assessment/Plan Present on Admission:  .SOB/ fever/c ough with clear sputum, cxr does not show pneumonia: will treat her for copd bronchitis with levaquin and bronchodilator therapy. She is not wheezing on my exam. She received 125mg  of iv solumdrol once.  Influenza pcr is pending.  Marland KitchenCOPD (chronic obstructive pulmonary disease): nebs, oxygen, short trial of steroids.  .Hypertension: controlled. Continue with her home meds.  .Hyperlipemia: fasting lipid panel.  .Tobacco abuse: refused nicotinepatch, ordered tobacco counseling.  .Fever and chills: influenza pcr pending.  .Diarrhea: r/o c diff. Stool cultures ordered.   Advanced Directives: pt is DNR.this has  been confirmed tonight.   Riaan Toledo 09/26/2011, 5:56 PM

## 2011-09-26 NOTE — ED Notes (Addendum)
Shortness of breath started yesterday, lives in an independent  living area at [facility], Nurse checked on hr troday, found her gasping for air. On 2nd treatment from EMS-- continues to be in resp distress, wheezes audible througout,  Arrived via [facility], breathing treatment in progress, 2nd treatment, duo-neb--has also received Solu-Medrol 125mg  IV

## 2011-09-26 NOTE — ED Provider Notes (Signed)
Medical screening examination/treatment/procedure(s) were conducted as a shared visit with non-physician practitioner(s) and myself.  I personally evaluated the patient during the encounter  Nataliya Graig, MD 09/26/11 2245 

## 2011-09-26 NOTE — ED Notes (Signed)
WUJ:WJ19<JY> Expected date:09/26/11<BR> Expected time:10:59 AM<BR> Means of arrival:Ambulance<BR> Comments:<BR>

## 2011-09-26 NOTE — ED Provider Notes (Signed)
History     CSN: [id number]  Arrival date & time 09/26/11  1112   First MD Initiated Contact with Patient 09/26/11 1119      Chief Complaint  Patient presents with  . Shortness of Breath  . Fever    (Consider location/radiation/quality/duration/timing/severity/associated sxs/prior treatment) Patient is a 77 y.o. female presenting with shortness of breath. The history is provided by the patient.  Shortness of Breath  The current episode started today. The onset was gradual. The problem occurs continuously. The problem has been gradually worsening. The problem is severe. The symptoms are relieved by nothing. The symptoms are aggravated by nothing. Associated symptoms include rhinorrhea, cough, shortness of breath and wheezing. Pertinent negatives include no chest pain, no chest pressure, no fever and no sore throat. Her past medical history does not include asthma. Past medical history comments: emphysema, granulomatous lesion to right lung. Urine output has been normal. Recently, medical care has been given by the PCP.  Pt seen by PCP yesterday as she has been feeling ill with flu sx x 4 days, was dx with bronchitis and influenza. Significantly increased SOB overnight. Treated prior to arrival with albuterol/atrovent neb x 2 and solu-medrol 125 by EMS.  Past Medical History  Diagnosis Date  . Cancer   . Seizures   . GERD (gastroesophageal reflux disease)   . Peripheral vascular disease     History reviewed. No pertinent past surgical history.  History reviewed. No pertinent family history.  History  Substance Use Topics  . Smoking status: Not on file  . Smokeless tobacco: Not on file  . Alcohol Use: No     Review of Systems  Constitutional: Positive for chills. Negative for fever.  HENT: Positive for congestion and rhinorrhea. Negative for sore throat, neck pain, neck stiffness and tinnitus.   Eyes: Negative for pain and visual disturbance.  Respiratory: Positive for  cough, shortness of breath and wheezing. Negative for chest tightness.   Cardiovascular: Negative for chest pain and leg swelling.  Gastrointestinal: Negative for nausea, vomiting and abdominal pain.  Genitourinary: Negative for dysuria and hematuria.  Musculoskeletal: Negative for back pain and joint swelling.  Skin: Negative for rash and wound.  Neurological: Negative for seizures, syncope, weakness and headaches.  Psychiatric/Behavioral: Negative for confusion.    Allergies  Namenda; Venlafaxine; Penicillins; and Zithromax  Home Medications  No current outpatient prescriptions on file.  BP 152/68  Pulse 108  Temp(Src) 98.5 F (36.9 C) (Axillary)  Resp 28  SpO2 100%  Physical Exam  Nursing note and vitals reviewed. Constitutional: She is oriented to person, place, and time. She appears well-developed and well-nourished. She appears distressed.       Disheveled appearance  HENT:  Head: Normocephalic and atraumatic.  Right Ear: External ear normal.  Left Ear: External ear normal.  Mouth/Throat: Oropharynx is clear and moist.  Eyes: Conjunctivae and EOM are normal. Pupils are equal, round, and reactive to light.  Neck: Normal range of motion. Neck supple.  Cardiovascular: Regular rhythm.        tachycardia  Pulmonary/Chest: Accessory muscle usage present. Tachypnea noted. She is in respiratory distress. She has decreased breath sounds. She has wheezes.  Abdominal: Soft. Bowel sounds are normal. She exhibits no distension. There is no tenderness.  Musculoskeletal: Normal range of motion. She exhibits no edema and no tenderness.  Neurological: She is alert and oriented to person, place, and time. No cranial nerve deficit. Coordination normal.  Skin: Skin is warm and dry. Rash noted.  No pallor.    ED Course  Procedures (including critical care time)  Labs Reviewed  BASIC METABOLIC PANEL - Abnormal; Notable for the following:    Glucose, Bld 113 (*)    GFR calc non Af Amer  81 (*)    All other components within normal limits  CBC - Abnormal; Notable for the following:    Platelets 130 (*)    All other components within normal limits  BLOOD GAS, VENOUS - Abnormal; Notable for the following:    pCO2, Ven 52.7 (*)    Bicarbonate 24.8 (*)    All other components within normal limits  LACTIC ACID, PLASMA  PROCALCITONIN  TROPONIN I  CULTURE, BLOOD (ROUTINE X 2)  CULTURE, BLOOD (ROUTINE X 2)  INFLUENZA PANEL BY PCR   Dg Chest 1 View  09/26/2011  *RADIOLOGY REPORT*  Clinical Data: Shortness of breath.  Cough.  CHEST - 1 VIEW  Comparison: Chest x-ray 08/21/2010 and PET scan 10/31/2010.  Findings: The heart size is normal.  A granuloma in the right upper lobe is stable.  Mild interstitial coarsening is chronic.  No focal airspace disease is evident.  IMPRESSION:  1.  No acute cardiopulmonary disease or significant interval change.  Original Report Authenticated By: Jamesetta Orleans. MATTERN, M.D.    Date: 09/26/2011  Rate: 104  Rhythm: sinus tachycardia  QRS Axis: normal  Intervals: normal  ST/T Wave abnormalities: normal  Conduction Disutrbances:none  Narrative Interpretation:   Old EKG Reviewed: unchanged    1. Influenza   2. Shortness of breath       MDM  11:40 Pt with recent dx bronchitis and influenza, feeling generally ill x 4 days. Increased WOB on exam with wheezing and decreased air movement. Orders placed. Will continue to monitor with possible bipap as needed. Pt is full code per her request.    12:30 PM Pt with decreased work of breathing. O2 saturation on 2L via [place] is at 96%. Increased air movement with almost absent wheezes on auscultation. Awaiting further test results.    3:45 PM Labs and CXR reviewed. Patient to be admitted as inpatient with dx: influenza, shortness of breath. Based on her age and significant SOB on presentation, she is not a good candidate for outpatient management.  Elwyn Reach [place], [place] 09/26/11 [phone]

## 2011-09-26 NOTE — ED Notes (Signed)
Pt repositioned in bed to be able to eat.  Ham sandwich and coke given.  Family at b/s.

## 2011-09-27 ENCOUNTER — Inpatient Hospital Stay (HOSPITAL_COMMUNITY): Payer: Medicare Other

## 2011-09-27 LAB — URINALYSIS, ROUTINE W REFLEX MICROSCOPIC
Glucose, UA: 250 mg/dL — AB
Leukocytes, UA: NEGATIVE
Nitrite: NEGATIVE
Protein, ur: 30 mg/dL — AB
Urobilinogen, UA: 1 mg/dL (ref 0.0–1.0)

## 2011-09-27 LAB — COMPREHENSIVE METABOLIC PANEL
ALT: 13 U/L (ref 0–35)
AST: 17 U/L (ref 0–37)
Alkaline Phosphatase: 89 U/L (ref 39–117)
CO2: 27 mEq/L (ref 19–32)
GFR calc Af Amer: >90 mL/min (ref >90)
GFR calc non Af Amer: 80 mL/min — ABNORMAL LOW (ref >90)
Glucose, Bld: 135 mg/dL — ABNORMAL HIGH (ref 70–99)
Potassium: 3.8 mEq/L (ref 3.5–5.1)
Sodium: 141 mEq/L (ref 135–145)

## 2011-09-27 LAB — CBC
HCT: 38.9 % (ref 36.0–46.0)
Hemoglobin: 12.8 g/dL (ref 12.0–15.0)
MCV: 87 fL (ref 78.0–100.0)
RDW: 15 % (ref 11.5–15.5)
WBC: 6.6 10*3/uL (ref 4.0–10.5)

## 2011-09-27 LAB — URINE MICROSCOPIC-ADD ON

## 2011-09-27 MED ORDER — LEVOFLOXACIN 500 MG PO TABS: 750.0000 mg | ORAL_TABLET | Freq: Every day | ORAL | Status: DC

## 2011-09-27 MED ORDER — OSELTAMIVIR PHOSPHATE 75 MG PO CAPS: 75.0000 mg | ORAL_CAPSULE | Freq: Two times a day (BID) | ORAL | Status: AC

## 2011-09-27 MED ORDER — METHYLPREDNISOLONE SODIUM SUCC 40 MG IJ SOLR
40.0000 mg | Freq: Two times a day (BID) | INTRAMUSCULAR | Status: DC
Start: 2011-09-27 — End: 2011-09-27
  Filled 2011-09-27: qty 1

## 2011-09-27 NOTE — Plan of Care (Signed)
Problem: Discharge Progression Outcomes Goal: Dyspnea controlled Outcome: Adequate for Discharge Patients dyspnea is controlled at rest but does get dyspneic with ambulation however she is a DNR and is adamant that she wants discharge  Goal: O2 sats > or equal 90% or at baseline Outcome: Adequate for Discharge Patients sats 83% on room air, up to 88-91% on 3liters.  Home O2 ordered and set up.   Goal: Home O2 if indicated Outcome: Adequate for Discharge Home O2 set up through [facility]  Goal: Pneumonia vaccine received if indicated Outcome: Not Applicable Date Met:  09/27/11 Pneumonia vaccine up to date

## 2011-09-27 NOTE — Plan of Care (Signed)
Problem: Phase I Progression Outcomes Goal: Flu/PneumoVaccines if indicated Outcome: Not Applicable Date Met:  09/27/11 Pt refuses pneumonia and flu vaccine, states she does not believe in taking them, has never had a flu vaccine, informed pt her flu screening was positive.

## 2011-09-27 NOTE — Progress Notes (Signed)
   CARE MANAGEMENT NOTE 09/27/2011  Patient:  Jessica [name], Jessica [name]   Account Number:  [id number]  Date Initiated:  09/27/2011  Documentation initiated by:  Tera Mater  Subjective/Objective Assessment:   DX:  SOB, fever.     Action/Plan:   Pt. lives at [facility] with her spouse.   Anticipated DC Date:  09/27/2011   Anticipated DC Plan:  HOME W HOME HEALTH SERVICES      DC Planning Services  CM consult      Choice offered to / List presented to:     DME arranged  [facility]      DME agency  [facility]        Status of service:  Completed, signed off Medicare Important Message given?  NO (If response is "NO", the following Medicare IM given date fields will be blank) Date Medicare IM given:   Date Additional Medicare IM given:    Discharge Disposition:  HOME W HOME HEALTH SERVICES  Per UR Regulation:    Comments:  09/27/11 1645--Spoke with pt. about HH services and pt. stated at the [facility], where she resides, they can provide PT/OT if needed.  This CM provided this information on the discharge instructions for staff at  [facility] to provide PT/OT and to evaluate and treat.  TC to [place], with [facility] to make referral for home continuous oxygen and to make aware of discharge today.  Tera Mater, RN, BSN Case Manager [phone]

## 2011-09-27 NOTE — Plan of Care (Signed)
Problem: Phase II Progression Outcomes Goal: Progress activity as tolerated unless otherwise ordered Outcome: Progressing O2 sats dropped to 80% on 3L O2 during ambulation with PT. Pt is currently demonstrating poor activity tolerance with limited mobility.

## 2011-09-27 NOTE — Progress Notes (Signed)
Patients oxygen saturation dropped to 83% on room air at rest.  When applying Oxygen at 3 liters per nasal cannula oxygen saturation climbed to 92-93%.  On ambulation with oxygen at 3 liters applied oxygen saturation maintained 89-91%.

## 2011-09-27 NOTE — Progress Notes (Signed)
Went over discharge instructions with patient and daughter, questions answered.  Verbalized understanding of instructions.  Stressed to patient that she could not smoke with oxygen on or even in the same room as oxygen is highly flammable.  Stressed this to patient and her daughter who both verbalized understanding and the patient reassured everyone that she would not smoke with oxygen on.

## 2011-09-27 NOTE — Discharge Summary (Signed)
DISCHARGE SUMMARY  Jessica [name]  MR#: [id number]  DOB:04-Apr-1932  Date of Admission: 09/26/2011 Date of Discharge: 09/27/2011  Attending Physician:Argil Mahl  Patient's PCP:No primary provider on file.  Consults: nonE  Discharge Diagnoses: Present on Admission:  .Bronchitis .COPD (chronic obstructive pulmonary disease) .Hypertension .Hyperlipemia .Tobacco abuse .Fever and chills .Diarrhea RESOLVED.     Discharge Medication List as of 09/27/2011  5:16 PM    START taking these medications   Details  levofloxacin (LEVAQUIN) 500 MG tablet Take 1.5 tablets (750 mg total) by mouth daily., Starting 09/27/2011, Until Tue 10/07/11, Print    oseltamivir (TAMIFLU) 75 MG capsule Take 1 capsule (75 mg total) by mouth 2 (two) times daily., Starting 09/27/2011, Until Tue 10/07/11, Print      CONTINUE these medications which have NOT CHANGED   Details  cholecalciferol (VITAMIN D) 1000 UNITS tablet Take 2,000 Units by mouth daily.  , Until Discontinued, Historical Med    esomeprazole (NEXIUM) 40 MG capsule Take 40 mg by mouth 2 (two) times daily.  , Until Discontinued, Historical Med    estradiol (VIVELLE-DOT) 0.05 MG/24HR Place 1 patch onto the skin once a week.  , Until Discontinued, Historical Med    lamoTRIgine (LAMICTAL) 150 MG tablet Take 150-200 mg by mouth 2 (two) times daily. Take 150mg  every morning and 200mg  every afternoon. , Until Discontinued, Historical Med    LORazepam (ATIVAN) 1 MG tablet Take 1 mg by mouth 2 (two) times daily.  , Until Discontinued, Historical Med    metoprolol (TOPROL-XL) 50 MG 24 hr tablet Take 50 mg by mouth daily.  , Until Discontinued, Historical Med    olopatadine (PATANOL) 0.1 % ophthalmic solution Place 1 drop into both eyes daily.  , Until Discontinued, Historical Med    [employer] (OYSTER CALCIUM PO) Take 1,500 mg by mouth 2 (two) times daily.  , Until Discontinued, Historical Med    simvastatin (ZOCOR) 20 MG tablet Take 20 mg  by mouth at bedtime.  , Until Discontinued, Historical Med    valsartan (DIOVAN) 160 MG tablet Take 160 mg by mouth daily.  , Until Discontinued, Historical Med    zonisamide (ZONEGRAN) 100 MG capsule Take 100 mg by mouth daily.  , Until Discontinued, Historical Med      STOP taking these medications     mirtazapine (REMERON) 15 MG tablet       Brief Hospital Course:   77 year old lady from [facility] came in for worsening sob, cough with clear sputum, fever. Initially she was hypoxic requiring 50% of oxygen, currently she is on nasal oxygen saturating 98%. cxr does not show pneumonia. She was admitted for evaluation of iinfluenza and bronchitis.    Hospital Course: Present on Admission:  . .SOB/ fever/c ough with clear sputum, cxr does not show pneumonia:treated her for copd bronchitis with levaquin and bronchodilator therapy. She is not wheezing on my exam. She received 125mg  of iv solumdrol once in ED and 2 doses of ov solumedrol. Influenza pcr is positive, but patient refused tamiflu. She also required oxygen on ambulation. Her sats dropped to 8% on ambulation without oxygen. But patient wanted to go home and she was very adamant. Called her daughter, but they wanted to take her home anyway. I have discussed the risks of taking her home without proper treatment , they verbalised understanding and said they would come to ED if her sob gets worse. Called case manager and arranged for home o2 and home PT.  SHE  IS ALSO recommended to follow up with PCP in one week.  Marland KitchenCOPD (chronic obstructive pulmonary disease): nebs, oxygen, short trial of steroids, responded very well. She refused oral prednisone on discharge.  .Hypertension: controlled. Continue with her home meds.  .Hyperlipemia: fasting lipid panel.  .Tobacco abuse: refused nicotinepatch, .Diarrhea:resolved          Day of Discharge BP 158/65  Pulse 73  Temp(Src) 97.8 F (36.6 C) (Oral)  Resp 20  Ht 5\' 4"  (1.626 m)   Wt 62.8 kg (138 lb 7.2 oz)  BMI 23.76 kg/m2  SpO2 92%  Physical Exam:     Cardiovascular: RRR, S1 normal, S2 normal, no MRG, Pulmonary/Chest: good air entry. No wheezing. Scattered rhonchi.  Abdominal: Soft. Non-tender, non-distended, bowel sounds are normal, no masses, organomegaly, or guarding present.  Musculoskeletal: No joint deformities, erythema, or stiffness, ROM full and no nontender  Results for orders placed during the hospital encounter of 09/26/11 (from the past 24 hour(s))  URINALYSIS, ROUTINE W REFLEX MICROSCOPIC     Status: Abnormal   Collection Time   09/27/11  1:22 AM      Component Value Range   Color, Urine YELLOW  YELLOW    APPearance CLOUDY (*) CLEAR    Specific Gravity, Urine 1.028  1.005 - 1.030    pH 6.0  5.0 - 8.0    Glucose, UA 250 (*) NEGATIVE (mg/dL)   Hgb urine dipstick NEGATIVE  NEGATIVE    Bilirubin Urine NEGATIVE  NEGATIVE    Ketones, ur 40 (*) NEGATIVE (mg/dL)   Protein, ur 30 (*) NEGATIVE (mg/dL)   Urobilinogen, UA 1.0  0.0 - 1.0 (mg/dL)   Nitrite NEGATIVE  NEGATIVE    Leukocytes, UA NEGATIVE  NEGATIVE   URINE MICROSCOPIC-ADD ON     Status: Abnormal   Collection Time   09/27/11  1:22 AM      Component Value Range   Squamous Epithelial / LPF FEW (*) RARE    WBC, UA 0-2  <3 (WBC/hpf)   RBC / HPF 0-2  <3 (RBC/hpf)   Bacteria, UA MANY (*) RARE    Crystals CA OXALATE CRYSTALS (*) NEGATIVE   COMPREHENSIVE METABOLIC PANEL     Status: Abnormal   Collection Time   09/27/11  6:45 AM      Component Value Range   Sodium 141  135 - 145 (mEq/L)   Potassium 3.8  3.5 - 5.1 (mEq/L)   Chloride 107  96 - 112 (mEq/L)   CO2 27  19 - 32 (mEq/L)   Glucose, Bld 135 (*) 70 - 99 (mg/dL)   BUN 18  6 - 23 (mg/dL)   Creatinine, Ser 4.09  0.50 - 1.10 (mg/dL)   Calcium 9.3  8.4 - 81.1 (mg/dL)   Total Protein 6.3  6.0 - 8.3 (g/dL)   Albumin 3.0 (*) 3.5 - 5.2 (g/dL)   AST 17  0 - 37 (U/L)   ALT 13  0 - 35 (U/L)   Alkaline Phosphatase 89  39 - 117 (U/L)    Total Bilirubin 0.2 (*) 0.3 - 1.2 (mg/dL)   GFR calc non Af Amer 80 (*) >90 (mL/min)   GFR calc Af Amer >90  >90 (mL/min)  CBC     Status: Normal   Collection Time   09/27/11  6:45 AM      Component Value Range   WBC 6.6  4.0 - 10.5 (K/uL)   RBC 4.47  3.87 - 5.11 (MIL/uL)   Hemoglobin 12.8  12.0 - 15.0 (g/dL)   HCT 16.1  09.6 - 04.5 (%)   MCV 87.0  78.0 - 100.0 (fL)   MCH 28.6  26.0 - 34.0 (pg)   MCHC 32.9  30.0 - 36.0 (g/dL)   RDW 40.9  81.1 - 91.4 (%)   Platelets 181  150 - 400 (K/uL)    Disposition: Home with home PT   Follow-up Appts: Discharge Orders    Future Orders Please Complete By Expires   Diet - low sodium heart healthy      Increase activity slowly      Discharge instructions      Comments:   Follow up with PCP in one to two weeks.      Follow-up with Dr.pcp in one week.     Time spent in discharge (includes decision making & examination of pt): 42 min  Signed: Cynde Menard 09/27/2011, 7:52 PM

## 2011-09-27 NOTE — Progress Notes (Signed)
Pt refusing lovenox, purpose and importance explained, pt still refusing. Pt also refuses tamiflu due to seizure disorder, states she has been told not to take it.

## 2011-09-27 NOTE — Progress Notes (Signed)
Physical Therapy Evaluation Patient Details Name: Jessica [name] MRN: [id number] DOB: 03-13-1932 Today's Date: 09/27/2011 Time: 4098-1191  Eval II Problem List:  Patient Active Problem List  Diagnoses  . Bronchitis  . COPD (chronic obstructive pulmonary disease)  . Hypertension  . Hyperlipemia  . Tobacco abuse  . Fever and chills  . Diarrhea    Past Medical History:  Past Medical History  Diagnosis Date  . Seizures   . GERD (gastroesophageal reflux disease)   . Peripheral vascular disease   . Anxiety   . Hyperlipidemia   . Hypertension   . Cancer     colon cancer-hx of chemo  . Embolism - blood clot 1996    left breast  . Bone infection of left hand 12/22/2008    secondary to injury  . Melanoma     metastatic  . Hemorrhoids    Past Surgical History:  Past Surgical History  Procedure Date  . C-sections 1957, 1959, 1961    x3  . Abdominal hysterectomy 1963  . Colon resection 1995  . Vocal cord injection 1996, 1997  . Breast biopsies     x2 on left, x 1 on right  . Hemorrhoid surgery   . Porta cath placement 1995  . Porta cath removal 1996  . Heart ablation 1997  . Bladder repair   . Left femoral artery stent 05/07/2004  . Melanoma removed from left leg 09/06/2004  . Melanoma removed from left groin 09/2004  . Skin graft to left leg 12/05/2004  . Orif left humerus fracture 03/27/2005  . Laser surgery to right eye 10/04/2007  . Removal of tumor in left knee 11/15/2010    PT Assessment/Plan/Recommendation PT Assessment Clinical Impression Statement: Pt presents with diagnosis of bronchitis. Pt will benefit from skilled PT in the acute care setting to improve general strength, activity tolerance, and gait in preperation for D/C.  PT Recommendation/Assessment: Patient will need skilled PT in the acute care venue PT Problem List: Decreased strength;Decreased activity tolerance;Decreased balance;Decreased mobility Barriers to Discharge: Decreased caregiver  support PT Therapy Diagnosis : Difficulty walking;Generalized weakness PT Plan PT Frequency: Min 3X/week PT Treatment/Interventions: Gait training;Functional mobility training;Therapeutic activities;Therapeutic exercise;Patient/family education PT Recommendation Recommendations for Other Services: OT consult Follow Up Recommendations: Skilled nursing facility (unless pt progresses well with activity tolerance and can reach Mod I level) Equipment Recommended:  (to be determined) PT Goals  Acute Rehab PT Goals PT Goal Formulation: With patient Time For Goal Achievement: 7 days Pt will go Sit to Stand: with modified independence PT Goal: Sit to Stand - Progress: Not met Pt will Ambulate: 51 - 150 feet;with least restrictive assistive device PT Goal: Ambulate - Progress: Not met  PT Evaluation Precautions/Restrictions  Precautions Precautions: Fall Prior Functioning  Home Living Lives With: Spouse Type of Home: Independent living facility ([facility]) per pt Home Layout: One level Home Access: Level entry Prior Function Level of Independence: Independent with basic ADLs;Independent with gait Cognition Cognition Arousal/Alertness: Awake/[name] Overall Cognitive Status: Appears within functional limits for tasks assessed Orientation Level: Oriented X4 Sensation/Coordination Sensation Light Touch: Appears Intact Coordination Gross Motor Movements are Fluid and Coordinated: Yes Extremity Assessment RLE Assessment RLE Assessment: Within Functional Limits LLE Assessment LLE Assessment: Within Functional Limits Mobility (including Balance) Bed Mobility Bed Mobility: Yes Supine to Sit: 6: Modified independent (Device/Increase time) Transfers Transfers: Yes Sit to Stand: 5: Supervision Stand to Sit: 5: Supervision Ambulation/Gait Ambulation/Gait: Yes Ambulation/Gait Assistance: 4: Min-guard Ambulation/Gait Assistance Details (indicate cue type and reason):  Min-guard assist  with pt holding onto IV pole and handrail initially. Slow gait speed. Poor activity tolerance. Unsteady with gait. O2 sats dropped to 80% on 3L O2 during ambulation. Ambulation Distance (Feet): 90 Feet. Standing rest break needed during ambulation. Assistive device: None Gait Pattern: Step-through pattern;Trunk flexed  Posture/Postural Control Posture/Postural Control: No significant limitations Balance Balance Assessed:  (Unsteady with gait, especially initially) Exercise    End of Session PT - End of Session Equipment Utilized During Treatment: Gait belt (O2) Activity Tolerance: Patient limited by fatigue Patient left: in bed;with call bell in reach;with bed alarm set (with RN giving out meds) General Behavior During Session: [facility] for tasks performed Cognition: [facility] for tasks performed  Jessica [name] [facility] 09/27/2011, 11:38 AM [phone]

## 2011-09-28 LAB — URINE CULTURE
Colony Count: >=100000
Culture  Setup Time: 201212290140

## 2011-09-29 LAB — CULTURE, RESPIRATORY W GRAM STAIN: Culture: NORMAL

## 2011-10-01 DIAGNOSIS — J4489 Other specified chronic obstructive pulmonary disease: Secondary | ICD-10-CM

## 2011-10-01 DIAGNOSIS — J449 Chronic obstructive pulmonary disease, unspecified: Secondary | ICD-10-CM

## 2011-10-01 HISTORY — DX: Other specified chronic obstructive pulmonary disease: J44.89

## 2011-10-01 HISTORY — DX: Chronic obstructive pulmonary disease, unspecified: J44.9

## 2011-10-01 LAB — BLOOD GAS, VENOUS
Acid-base deficit: 2 mmol/L (ref 0.0–2.0)
pCO2, Ven: 52.7 mmHg — ABNORMAL HIGH (ref 45.0–50.0)
pO2, Ven: 41.1 mmHg (ref 30.0–45.0)

## 2011-10-02 LAB — CULTURE, BLOOD (ROUTINE X 2)
Culture  Setup Time: 201212281726
Culture: NO GROWTH

## 2011-10-03 ENCOUNTER — Emergency Department (HOSPITAL_COMMUNITY): Payer: Medicare Other

## 2011-10-03 ENCOUNTER — Encounter (HOSPITAL_COMMUNITY): Payer: Self-pay | Admitting: *Deleted

## 2011-10-03 ENCOUNTER — Inpatient Hospital Stay (HOSPITAL_COMMUNITY)
Admission: EM | Admit: 2011-10-03 | Discharge: 2011-10-20 | DRG: 208 | Disposition: A | Discharge status: Skilled Nursing Facility | Payer: Medicare Other | Attending: Internal Medicine | Admitting: Internal Medicine

## 2011-10-03 DIAGNOSIS — R4182 Altered mental status, unspecified: Secondary | ICD-10-CM

## 2011-10-03 DIAGNOSIS — J449 Chronic obstructive pulmonary disease, unspecified: Secondary | ICD-10-CM

## 2011-10-03 DIAGNOSIS — E876 Hypokalemia: Secondary | ICD-10-CM | POA: Diagnosis present

## 2011-10-03 DIAGNOSIS — R1310 Dysphagia, unspecified: Secondary | ICD-10-CM | POA: Diagnosis not present

## 2011-10-03 DIAGNOSIS — J441 Chronic obstructive pulmonary disease with (acute) exacerbation: Principal | ICD-10-CM | POA: Diagnosis present

## 2011-10-03 DIAGNOSIS — K219 Gastro-esophageal reflux disease without esophagitis: Secondary | ICD-10-CM | POA: Diagnosis present

## 2011-10-03 DIAGNOSIS — R197 Diarrhea, unspecified: Secondary | ICD-10-CM

## 2011-10-03 DIAGNOSIS — R7309 Other abnormal glucose: Secondary | ICD-10-CM | POA: Diagnosis not present

## 2011-10-03 DIAGNOSIS — Z9221 Personal history of antineoplastic chemotherapy: Secondary | ICD-10-CM

## 2011-10-03 DIAGNOSIS — Z720 Tobacco use: Secondary | ICD-10-CM

## 2011-10-03 DIAGNOSIS — I10 Essential (primary) hypertension: Secondary | ICD-10-CM | POA: Diagnosis present

## 2011-10-03 DIAGNOSIS — F17200 Nicotine dependence, unspecified, uncomplicated: Secondary | ICD-10-CM | POA: Diagnosis present

## 2011-10-03 DIAGNOSIS — G40309 Generalized idiopathic epilepsy and epileptic syndromes, not intractable, without status epilepticus: Secondary | ICD-10-CM | POA: Diagnosis not present

## 2011-10-03 DIAGNOSIS — E46 Unspecified protein-calorie malnutrition: Secondary | ICD-10-CM | POA: Diagnosis present

## 2011-10-03 DIAGNOSIS — F411 Generalized anxiety disorder: Secondary | ICD-10-CM | POA: Diagnosis present

## 2011-10-03 DIAGNOSIS — Z85038 Personal history of other malignant neoplasm of large intestine: Secondary | ICD-10-CM

## 2011-10-03 DIAGNOSIS — R5381 Other malaise: Secondary | ICD-10-CM | POA: Diagnosis not present

## 2011-10-03 DIAGNOSIS — T380X5A Adverse effect of glucocorticoids and synthetic analogues, initial encounter: Secondary | ICD-10-CM | POA: Diagnosis not present

## 2011-10-03 DIAGNOSIS — E785 Hyperlipidemia, unspecified: Secondary | ICD-10-CM | POA: Diagnosis present

## 2011-10-03 DIAGNOSIS — G9340 Encephalopathy, unspecified: Secondary | ICD-10-CM | POA: Diagnosis present

## 2011-10-03 DIAGNOSIS — J9600 Acute respiratory failure, unspecified whether with hypoxia or hypercapnia: Secondary | ICD-10-CM | POA: Diagnosis present

## 2011-10-03 DIAGNOSIS — R569 Unspecified convulsions: Secondary | ICD-10-CM

## 2011-10-03 DIAGNOSIS — R000 Tachycardia, unspecified: Secondary | ICD-10-CM | POA: Diagnosis not present

## 2011-10-03 DIAGNOSIS — J09X1 Influenza due to identified novel influenza A virus with pneumonia: Secondary | ICD-10-CM | POA: Diagnosis present

## 2011-10-03 DIAGNOSIS — Z86718 Personal history of other venous thrombosis and embolism: Secondary | ICD-10-CM

## 2011-10-03 DIAGNOSIS — I739 Peripheral vascular disease, unspecified: Secondary | ICD-10-CM | POA: Diagnosis present

## 2011-10-03 DIAGNOSIS — E870 Hyperosmolality and hypernatremia: Secondary | ICD-10-CM | POA: Diagnosis present

## 2011-10-03 DIAGNOSIS — D72829 Elevated white blood cell count, unspecified: Secondary | ICD-10-CM | POA: Diagnosis not present

## 2011-10-03 DIAGNOSIS — G40909 Epilepsy, unspecified, not intractable, without status epilepticus: Secondary | ICD-10-CM

## 2011-10-03 DIAGNOSIS — J40 Bronchitis, not specified as acute or chronic: Secondary | ICD-10-CM

## 2011-10-03 DIAGNOSIS — R509 Fever, unspecified: Secondary | ICD-10-CM

## 2011-10-03 LAB — DIFFERENTIAL
Basophils Relative: 0 % (ref 0–1)
Eosinophils Absolute: 0 10*3/uL (ref 0.0–0.7)
Monocytes Absolute: 1.1 10*3/uL — ABNORMAL HIGH (ref 0.1–1.0)
Monocytes Relative: 7 % (ref 3–12)

## 2011-10-03 LAB — CULTURE, BLOOD (ROUTINE X 2)
Culture  Setup Time: 201301050214
Culture: NO GROWTH

## 2011-10-03 LAB — URINALYSIS, ROUTINE W REFLEX MICROSCOPIC
Ketones, ur: 15 mg/dL — AB
Nitrite: NEGATIVE
Urobilinogen, UA: 1 mg/dL (ref 0.0–1.0)

## 2011-10-03 LAB — POCT I-STAT, CHEM 8
Calcium, Ion: 1.17 mmol/L (ref 1.12–1.32)
Glucose, Bld: 122 mg/dL — ABNORMAL HIGH (ref 70–99)
HCT: 41 % (ref 36.0–46.0)
Hemoglobin: 13.9 g/dL (ref 12.0–15.0)
Potassium: 3.3 mEq/L — ABNORMAL LOW (ref 3.5–5.1)

## 2011-10-03 LAB — CBC
HCT: 39.3 % (ref 36.0–46.0)
Hemoglobin: 13.2 g/dL (ref 12.0–15.0)
MCH: 28.1 pg (ref 26.0–34.0)
MCHC: 33.6 g/dL (ref 30.0–36.0)
MCV: 83.6 fL (ref 78.0–100.0)

## 2011-10-03 MED ORDER — OLMESARTAN MEDOXOMIL 20 MG PO TABS
20.0000 mg | ORAL_TABLET | Freq: Every day | ORAL | Status: DC
  Administered 2011-10-04 – 2011-10-20 (×14): 20 mg via ORAL
  Filled 2011-10-03 (×18): qty 1

## 2011-10-03 MED ORDER — ACETAMINOPHEN 650 MG RE SUPP: 650.0000 mg | Freq: Four times a day (QID) | RECTAL | Status: DC | PRN

## 2011-10-03 MED ORDER — METOPROLOL SUCCINATE ER 50 MG PO TB24
50.0000 mg | ORAL_TABLET | Freq: Every day | ORAL | Status: DC
  Administered 2011-10-04 – 2011-10-06 (×3): 50 mg via ORAL
  Filled 2011-10-03 (×4): qty 1

## 2011-10-03 MED ORDER — ALUM & MAG HYDROXIDE-SIMETH 200-200-20 MG/5ML PO SUSP: 30.0000 mL | Freq: Four times a day (QID) | ORAL | Status: DC | PRN

## 2011-10-03 MED ORDER — ENOXAPARIN SODIUM 40 MG/0.4ML ~~LOC~~ SOLN
40.0000 mg | Freq: Every day | SUBCUTANEOUS | Status: DC
  Administered 2011-10-03 – 2011-10-09 (×7): 40 mg via SUBCUTANEOUS
  Filled 2011-10-03 (×8): qty 0.4

## 2011-10-03 MED ORDER — PANTOPRAZOLE SODIUM 40 MG PO TBEC
40.0000 mg | DELAYED_RELEASE_TABLET | Freq: Every day | ORAL | Status: DC
  Administered 2011-10-04 – 2011-10-09 (×6): 40 mg via ORAL
  Filled 2011-10-03 (×7): qty 1

## 2011-10-03 MED ORDER — ONDANSETRON HCL 4 MG PO TABS: 4.0000 mg | ORAL_TABLET | Freq: Four times a day (QID) | ORAL | Status: DC | PRN

## 2011-10-03 MED ORDER — POTASSIUM CHLORIDE IN NACL 20-0.9 MEQ/L-% IV SOLN
INTRAVENOUS | Status: DC
  Administered 2011-10-03 – 2011-10-08 (×8): via INTRAVENOUS
  Filled 2011-10-03 (×13): qty 1000

## 2011-10-03 MED ORDER — LORAZEPAM 2 MG/ML IJ SOLN
0.5000 mg | Freq: Once | INTRAMUSCULAR | Status: DC
  Filled 2011-10-03: qty 1

## 2011-10-03 MED ORDER — ZONISAMIDE 100 MG PO CAPS
100.0000 mg | ORAL_CAPSULE | Freq: Every day | ORAL | Status: DC
  Administered 2011-10-04: 100 mg via ORAL
  Filled 2011-10-03: qty 1

## 2011-10-03 MED ORDER — LAMOTRIGINE 150 MG PO TABS: 150.0000 mg | ORAL_TABLET | Freq: Every day | ORAL | Status: DC

## 2011-10-03 MED ORDER — LAMOTRIGINE 200 MG PO TABS
200.0000 mg | ORAL_TABLET | Freq: Every day | ORAL | Status: DC
  Administered 2011-10-03: 200 mg via ORAL
  Filled 2011-10-03 (×3): qty 1

## 2011-10-03 MED ORDER — HYDROCODONE-ACETAMINOPHEN 5-325 MG PO TABS: 1.0000 | ORAL_TABLET | ORAL | Status: DC | PRN

## 2011-10-03 MED ORDER — LAMOTRIGINE 150 MG PO TABS
150.0000 mg | ORAL_TABLET | Freq: Every day | ORAL | Status: DC
  Administered 2011-10-04: 150 mg via ORAL
  Filled 2011-10-03: qty 1

## 2011-10-03 MED ORDER — SODIUM CHLORIDE 0.9 % IV SOLN: Freq: Once | INTRAVENOUS | Status: DC

## 2011-10-03 MED ORDER — SODIUM CHLORIDE 0.9 % IV BOLUS (SEPSIS)
500.0000 mL | Freq: Once | INTRAVENOUS | Status: AC
  Administered 2011-10-03: 500 mL via INTRAVENOUS

## 2011-10-03 MED ORDER — ACETAMINOPHEN 325 MG PO TABS
650.0000 mg | ORAL_TABLET | Freq: Four times a day (QID) | ORAL | Status: DC | PRN
  Administered 2011-10-08: 650 mg via ORAL
  Filled 2011-10-03: qty 2

## 2011-10-03 MED ORDER — ONDANSETRON HCL 4 MG/2ML IJ SOLN
4.0000 mg | Freq: Four times a day (QID) | INTRAMUSCULAR | Status: DC | PRN
  Administered 2011-10-04: 4 mg via INTRAVENOUS
  Filled 2011-10-03: qty 2

## 2011-10-03 MED ORDER — LAMOTRIGINE 200 MG PO TABS: 200.0000 mg | ORAL_TABLET | Freq: Every day | ORAL | Status: DC

## 2011-10-03 MED ORDER — OLOPATADINE HCL 0.1 % OP SOLN
1.0000 [drp] | Freq: Every day | OPHTHALMIC | Status: DC
  Administered 2011-10-05 – 2011-10-20 (×15): 1 [drp] via OPHTHALMIC
  Filled 2011-10-03 (×2): qty 5

## 2011-10-03 NOTE — ED Provider Notes (Signed)
History     CSN: [id number]  Arrival date & time 10/03/11  1341   First MD Initiated Contact with Patient 10/03/11 1355      No chief complaint on file.   (Consider location/radiation/quality/duration/timing/severity/associated sxs/prior treatment) Patient is a 78 y.o. female presenting with altered mental status. The history is provided by the nursing home.  Altered Mental Status This is a new problem. The current episode started today. The problem occurs constantly. The problem has been unchanged. Associated symptoms comments: She was seen on 09-26-11 and diagnosed with influenza. She refused admission at that time and was discharged by the hospitalist back to [facility] where she lives in independent living quarters. Per nursing home staff, she was moved to an infirmary bed 2 days ago when daughter advised staff she was not doing well, with persistent diarrhea, loss of appetite and refusing to drink anything. She is transported today after staff found her to have an altered mental status in that she was confused, "eyes rolling back" and began to have vomiting. Per staff she is usually alert, oriented, able to give full detailed medical history and live independently in a safe manor.. Exacerbated by: No known falls today and patient has been observed continuously.    Past Medical History  Diagnosis Date  . Seizures   . GERD (gastroesophageal reflux disease)   . Peripheral vascular disease   . Anxiety   . Hyperlipidemia   . Hypertension   . Cancer     colon cancer-hx of chemo  . Embolism - blood clot 1996    left breast  . Bone infection of left hand 12/22/2008    secondary to injury  . Melanoma     metastatic  . Hemorrhoids     Past Surgical History  Procedure Date  . C-sections 1957, 1959, 1961    x3  . Abdominal hysterectomy 1963  . Colon resection 1995  . Vocal cord injection 1996, 1997  . Breast biopsies     x2 on left, x 1 on right  . Hemorrhoid surgery   .  Porta cath placement 1995  . Porta cath removal 1996  . Heart ablation 1997  . Bladder repair   . Left femoral artery stent 05/07/2004  . Melanoma removed from left leg 09/06/2004  . Melanoma removed from left groin 09/2004  . Skin graft to left leg 12/05/2004  . Orif left humerus fracture 03/27/2005  . Laser surgery to right eye 10/04/2007  . Removal of tumor in left knee 11/15/2010    No family history on file.  History  Substance Use Topics  . Smoking status: Current Everyday Smoker -- 1.0 packs/day for 60 years  . Smokeless tobacco: Never Used  . Alcohol Use: No    OB History    Grav Para Term Preterm Abortions TAB SAB Ect Mult Living                  Review of Systems  Unable to perform ROS Psychiatric/Behavioral: Positive for altered mental status.    Allergies  Aspirin; Venlafaxine; Penicillins; and Zithromax  Home Medications   Current Outpatient Rx  Name Route Sig Dispense Refill  . VITAMIN D 1000 UNITS PO TABS Oral Take 2,000 Units by mouth daily.      Marland Kitchen ESOMEPRAZOLE MAGNESIUM 40 MG PO CPDR Oral Take 40 mg by mouth 2 (two) times daily.      Marland Kitchen ESTRADIOL 0.05 MG/24HR TD PTTW Transdermal Place 1 patch onto the skin once  a week.      Marland Kitchen LAMOTRIGINE 150 MG PO TABS Oral Take 150-200 mg by mouth daily. Take 150mg  every morning and 200mg  every afternoon.    Marland Kitchen LEVOFLOXACIN 500 MG PO TABS Oral Take 750 mg by mouth daily. Completed therapy on 10/01/11     . METOPROLOL SUCCINATE ER 50 MG PO TB24 Oral Take 50 mg by mouth daily.      . OLOPATADINE HCL 0.1 % OP SOLN Both Eyes Place 1 drop into both eyes daily.      . OSELTAMIVIR PHOSPHATE 75 MG PO CAPS Oral Take 1 capsule (75 mg total) by mouth 2 (two) times daily. 10 capsule 0  . OYSTER CALCIUM PO Oral Take 1,500 mg by mouth 2 (two) times daily.      Marland Kitchen SIMVASTATIN 20 MG PO TABS Oral Take 20 mg by mouth at bedtime.      Marland Kitchen VALSARTAN 160 MG PO TABS Oral Take 160 mg by mouth daily.      Marland Kitchen ZONISAMIDE 100 MG PO CAPS Oral Take 100 mg by  mouth daily.      Marland Kitchen LORAZEPAM 1 MG PO TABS Oral Take 0.5-1 mg by mouth 2 (two) times daily as needed. For anxiety      BP 150/65  Pulse 81  Temp(Src) 98.8 F (37.1 C) (Oral)  Resp 18  SpO2 98%  Physical Exam  Constitutional: She appears well-developed and well-nourished.       Quiet, cachectic appearing female, awake and responsive.  HENT:  Head: Normocephalic and atraumatic.       Oral mucosa extremely dry.  Eyes: Conjunctivae are normal. Pupils are equal, round, and reactive to light.  Neck: Normal range of motion. Neck supple.  Cardiovascular: Normal rate and regular rhythm.   Pulmonary/Chest: Effort normal. She has rales.       Rales bilaterally. No increased work of breathing. No respiratory distress. No active coughing.  Abdominal: Soft. Bowel sounds are normal. She exhibits no distension. There is no tenderness. There is no rebound and no guarding.  Musculoskeletal: Normal range of motion. She exhibits no edema.  Neurological: She is alert. No cranial nerve deficit.       The patient answers with "yes" or "no" only, inappropriate responses for questions asked.   Skin: Skin is warm and dry. No rash noted.  Psychiatric: She has a normal mood and affect.    ED Course  Procedures (including critical care time)   Labs Reviewed  CBC  DIFFERENTIAL  I-STAT, CHEM 8  LACTIC ACID, PLASMA  CULTURE, BLOOD (ROUTINE X 2)  CULTURE, BLOOD (ROUTINE X 2)   Dg Chest Portable 1 View  10/03/2011  *RADIOLOGY REPORT*  Clinical Data: Cough, chest pain, shortness of breath  PORTABLE CHEST - 1 VIEW  Comparison: 09/27/2011  Findings: Chronic interstitial markings/emphysematous changes. Stable calcified granuloma in the lateral right upper lobe. No pleural effusion or pneumothorax.  Cardiomediastinal silhouette is within normal limits.  Prior fixation of the left proximal humerus.  IMPRESSION: No evidence of acute cardiopulmonary disease.  Chronic interstitial markings/emphysematous changes.   Original Report Authenticated By: Charline Bills, M.D.     No diagnosis found.    MDM  On re-exam. Patient continues to be altered mentally. Lab studies are inconclusive. Discussed with [facility], Dr. Joneen Roach who will admit.        Rodena Medin, PA 10/03/11 1921

## 2011-10-03 NOTE — ED Notes (Signed)
Admission nurse at the bedside.

## 2011-10-03 NOTE — ED Notes (Signed)
Family at bedside. 

## 2011-10-03 NOTE — ED Notes (Signed)
Pt refused to have ativan administered. States that she does not want to have anything to make her "sleepy" or "relaxed". Pt alert x 3.

## 2011-10-03 NOTE — H&P (Addendum)
PCP:  unknown  Chief Complaint:  encephalopathy  HPI: This is a 78 year old female resident of independent living center, who was diagnosed with influenza 09/26/2011. At bedtime she was also Tamiflu and admission however she refused admission and went back to the independent living center. The patient has not been doing well at home and 2 days ago she was moved to a more monitored bed. Today she became confused and was transported to the ER. History obtained mainly from the ER physician. During my interview patient is alert but she is combative in her answers and refuses to give any information. There is evidence of for shortness of breath or wheezing. The patient does have a history of seizures and but is a report of any seizure activity. That being said there are some reports of her eyes rolled back of her head and vomiting.  Review of Systems: Positives bolded The patient denies anorexia, fever, weight loss,, vision loss, decreased hearing, hoarseness, chest pain, syncope, dyspnea on exertion, peripheral edema, balance deficits, hemoptysis, abdominal pain, melena, hematochezia, severe indigestion/heartburn, hematuria, incontinence, genital sores, muscle weakness, suspicious skin lesions, transient blindness, difficulty walking, depression, unusual weight change, abnormal bleeding, enlarged lymph nodes, angioedema, and breast masses.  Past Medical History: Past Medical History  Diagnosis Date  . Seizures   . GERD (gastroesophageal reflux disease)   . Peripheral vascular disease   . Anxiety   . Hyperlipidemia   . Hypertension   . Cancer     colon cancer-hx of chemo  . Embolism - blood clot 1996    left breast  . Bone infection of left hand 12/22/2008    secondary to injury  . Melanoma     metastatic  . Hemorrhoids    Past Surgical History  Procedure Date  . C-sections 1957, 1959, 1961    x3  . Abdominal hysterectomy 1963  . Colon resection 1995  . Vocal cord injection 1996, 1997    . Breast biopsies     x2 on left, x 1 on right  . Hemorrhoid surgery   . Porta cath placement 1995  . Porta cath removal 1996  . Heart ablation 1997  . Bladder repair   . Left femoral artery stent 05/07/2004  . Melanoma removed from left leg 09/06/2004  . Melanoma removed from left groin 09/2004  . Skin graft to left leg 12/05/2004  . Orif left humerus fracture 03/27/2005  . Laser surgery to right eye 10/04/2007  . Removal of tumor in left knee 11/15/2010    Medications: Prior to Admission medications   Medication Sig Start Date End Date Taking? Authorizing Provider  cholecalciferol (VITAMIN D) 1000 UNITS tablet Take 2,000 Units by mouth daily.     Yes Historical Provider, MD  esomeprazole (NEXIUM) 40 MG capsule Take 40 mg by mouth 2 (two) times daily.     Yes Historical Provider, MD  estradiol (VIVELLE-DOT) 0.05 MG/24HR Place 1 patch onto the skin once a week.     Yes Historical Provider, MD  lamoTRIgine (LAMICTAL) 150 MG tablet Take 150-200 mg by mouth daily. Take 150mg  every morning and 200mg  every afternoon.   Yes Historical Provider, MD  levofloxacin (LEVAQUIN) 500 MG tablet Take 750 mg by mouth daily. Completed therapy on 10/01/11  09/27/11 10/07/11 Yes Vijaya Akula  metoprolol (TOPROL-XL) 50 MG 24 hr tablet Take 50 mg by mouth daily.     Yes Historical Provider, MD  olopatadine (PATANOL) 0.1 % ophthalmic solution Place 1 drop into both eyes daily.  Yes Historical Provider, MD  oseltamivir (TAMIFLU) 75 MG capsule Take 1 capsule (75 mg total) by mouth 2 (two) times daily. 09/27/11 10/07/11 Yes Vijaya Akula  Oyster Shell (OYSTER CALCIUM PO) Take 1,500 mg by mouth 2 (two) times daily.     Yes Historical Provider, MD  simvastatin (ZOCOR) 20 MG tablet Take 20 mg by mouth at bedtime.     Yes Historical Provider, MD  valsartan (DIOVAN) 160 MG tablet Take 160 mg by mouth daily.     Yes Historical Provider, MD  zonisamide (ZONEGRAN) 100 MG capsule Take 100 mg by mouth daily.     Yes Historical  Provider, MD  LORazepam (ATIVAN) 1 MG tablet Take 0.5-1 mg by mouth 2 (two) times daily as needed. For anxiety    Historical Provider, MD    Allergies:   Allergies  Allergen Reactions  . Aspirin   . Venlafaxine Other (See Comments)    seizures  . Penicillins Rash  . Zithromax (Azithromycin) Rash    Social History:  reports that she has been smoking.  She has never used smokeless tobacco. She reports that she does not drink alcohol or use illicit drugs.  Family History: History reviewed. No pertinent family history.  Physical Exam: Filed Vitals:   10/03/11 1343 10/03/11 1618 10/03/11 1917  BP: 150/65 152/67 188/83  Pulse: 81 82 102  Temp: 98.8 F (37.1 C) 98.3 F (36.8 C) 98.5 F (36.9 C)  TempSrc: Oral Oral Oral  Resp: 18 21 20   SpO2: 98% 93% 95%    General:  Alert, well developed and nourished, no acute distress Eyes: PERRLA, pink conjunctiva, no scleral icterus ENT: Moist oral mucosa, neck supple, no thyromegaly Lungs: clear to ascultation, no wheeze, no crackles, no use of accessory muscles Cardiovascular: regular rate and rhythm, no regurgitation, no gallops, no murmurs. No carotid bruits, no JVD Abdomen: soft, positive BS, non-tender, non-distended, no organomegaly, not an acute abdomen GU: not examined Neuro: CN II - XII grossly intact, sensation intact Musculoskeletal: strength 5/5 all extremities, no clubbing, cyanosis or edema Skin: no rash, no subcutaneous crepitation, no decubitus Psych: Verbally combative patient   Labs on Admission:   Basename 10/03/11 1520  NA 139  K 3.3*  CL 104  CO2 --  GLUCOSE 122*  BUN 17  CREATININE 0.90  CALCIUM --  MG --  PHOS --  Results for OSHA, RANE (MRN [id number]) as of 10/03/2011 21:28  Ref. Range 10/03/2011 18:22  Color, Urine Latest Range: YELLOW  YELLOW  APPearance Latest Range: CLEAR  CLEAR  Specific Gravity, Urine Latest Range: 1.005-1.030  1.021  pH Latest Range: 5.0-8.0  6.0  Glucose, UA  Latest Range: NEGATIVE mg/dL NEGATIVE  Bilirubin Urine Latest Range: NEGATIVE  SMALL (A)  Ketones, ur Latest Range: NEGATIVE mg/dL 15 (A)  Protein Latest Range: NEGATIVE mg/dL NEGATIVE  Urobilinogen, UA Latest Range: 0.0-1.0 mg/dL 1.0  Nitrite Latest Range: NEGATIVE  NEGATIVE  Leukocytes, UA Latest Range: NEGATIVE  NEGATIVE   No results found for this basename: AST:2,ALT:2,ALKPHOS:2,BILITOT:2,PROT:2,ALBUMIN:2 in the last 72 hours No results found for this basename: LIPASE:2,AMYLASE:2 in the last 72 hours  Basename 10/03/11 1520 10/03/11 1500  WBC -- 15.0*  NEUTROABS -- 13.1*  HGB 13.9 13.2  HCT 41.0 39.3  MCV -- 83.6  PLT -- 270   No results found for this basename: CKTOTAL:3,CKMB:3,CKMBINDEX:3,TROPONINI:3 in the last 72 hours No components found with this basename: POCBNP:3 No results found for this basename: DDIMER:2 in the last 72 hours No  results found for this basename: HGBA1C:2 in the last 72 hours No results found for this basename: CHOL:2,HDL:2,LDLCALC:2,TRIG:2,CHOLHDL:2,LDLDIRECT:2 in the last 72 hours No results found for this basename: TSH,T4TOTAL,FREET3,T3FREE,THYROIDAB in the last 72 hours No results found for this basename: VITAMINB12:2,FOLATE:2,FERRITIN:2,TIBC:2,IRON:2,RETICCTPCT:2 in the last 72 hours  Micro Results: Recent Results (from the past 240 hour(s))  CULTURE, BLOOD (ROUTINE X 2)     Status: Normal   Collection Time   09/26/11 11:55 AM      Component Value Range Status Comment   Specimen Description BLOOD LEFT ANTECUBITAL   Final    Special Requests BOTTLES DRAWN AEROBIC AND ANAEROBIC 44CC   Final    Setup Time [id number]   Final    Culture NO GROWTH 5 DAYS   Final    Report Status 10/02/2011 FINAL   Final   CULTURE, BLOOD (ROUTINE X 2)     Status: Normal   Collection Time   09/26/11 12:07 PM      Component Value Range Status Comment   Specimen Description BLOOD LEFT HAND   Final    Special Requests BOTTLES DRAWN AEROBIC AND ANAEROBIC 4CC   Final     Setup Time [id number]   Final    Culture NO GROWTH 5 DAYS   Final    Report Status 10/02/2011 FINAL   Final   CULTURE, SPUTUM-ASSESSMENT     Status: Normal   Collection Time   09/26/11  6:29 PM      Component Value Range Status Comment   Specimen Description SPUTUM   Final    Special Requests NONE   Final    Sputum evaluation     Final    Value: THIS SPECIMEN IS ACCEPTABLE. RESPIRATORY CULTURE REPORT TO FOLLOW.   Report Status 09/26/2011 FINAL   Final   CULTURE, RESPIRATORY     Status: Normal   Collection Time   09/26/11  6:29 PM      Component Value Range Status Comment   Specimen Description SPUTUM   Final    Special Requests NONE   Final    Gram Stain     Final    Value: FEW WBC PRESENT, PREDOMINANTLY PMN     NO SQUAMOUS EPITHELIAL CELLS SEEN     FEW GRAM POSITIVE COCCI     IN PAIRS   Culture NORMAL OROPHARYNGEAL FLORA   Final    Report Status 09/29/2011 FINAL   Final   URINE CULTURE     Status: Normal   Collection Time   09/26/11  6:30 PM      Component Value Range Status Comment   Specimen Description URINE, CLEAN CATCH   Final    Special Requests NONE   Final    Setup Time [id number]   Final    Colony Count >=100,000 COLONIES/ML   Final    Culture ENTEROCOCCUS SPECIES   Final    Report Status 09/28/2011 FINAL   Final    Organism ID, Bacteria ENTEROCOCCUS SPECIES   Final      Radiological Exams on Admission: Ct Head Wo Contrast  10/03/2011  *RADIOLOGY REPORT*  Clinical Data: Sudden onset altered mental status.  History of hypertension.  History of melanoma.  CT HEAD WITHOUT CONTRAST  Technique:  Contiguous axial images were obtained from the base of the skull through the vertex without contrast.  Comparison: 01/31/2011.  Findings: There is no evidence for acute infarction, intracranial hemorrhage, mass lesion, hydrocephalus, or extra-axial fluid.  Mild atrophy is present.  There is  mild chronic microvascular ischemic change in the periventricular and subcortical  white matter.  I see no areas of edema suspicious for metastatic disease.  The calvarium is intact.  There is advanced vascular calcification.  Acute maxillary sinusitis on the right is observed.  Mastoid air cells are clear.  IMPRESSION: Stable appearing atrophy and small vessel disease from May 2012. No visible acute intracranial findings.  No evidence for metastatic disease on this noncontrast examination.  Original Report Authenticated By: Elsie Stain, M.D.   Dg Chest Portable 1 View  10/03/2011  *RADIOLOGY REPORT*  Clinical Data: Cough, chest pain, shortness of breath  PORTABLE CHEST - 1 VIEW  Comparison: 09/27/2011  Findings: Chronic interstitial markings/emphysematous changes. Stable calcified granuloma in the lateral right upper lobe. No pleural effusion or pneumothorax.  Cardiomediastinal silhouette is within normal limits.  Prior fixation of the left proximal humerus.  IMPRESSION: No evidence of acute cardiopulmonary disease.  Chronic interstitial markings/emphysematous changes.  Original Report Authenticated By: Charline Bills, M.D.    Assessment/Plan Present on Admission:  .Encephalopathy Influenza  Unclear etiology for encephalopathy except her recent flu We'll bring patient in for observation  UA chest x-rays are negative and no leukocytosis Will check an RPR, ammonia level, TSH, vitamin B 12 and folate levels  Patient not on any except his excessive medications that would result in encephalopathy from polypharmacy  On the 20th patient had a positive urine culture she has been on Levaquin, for UA today's negative .COPD (chronic obstructive pulmonary disease) .Hyperlipemia .Hypertension .Tobacco abuse Stable resume home medications  H/o Seizures Will order EEG for the a.m.  DVT prophylaxis Team 3/Dr. Juliann Pares, Shinika Estelle 10/03/2011, 9:24 PM

## 2011-10-03 NOTE — ED Notes (Signed)
HQI:ON62<XB> Expected date:10/03/11<BR> Expected time: 1:05 PM<BR> Means of arrival:Ambulance<BR> Comments:<BR> M62. 78 yo f. Sick. Febrile, n/v x 2 wks. Seen last week at your facility. req re-eval. 12-15 min eta.

## 2011-10-04 LAB — CBC
HCT: 39.1 % (ref 36.0–46.0)
Hemoglobin: 13 g/dL (ref 12.0–15.0)
MCH: 27.9 pg (ref 26.0–34.0)
MCHC: 33.2 g/dL (ref 30.0–36.0)
RBC: 4.66 MIL/uL (ref 3.87–5.11)

## 2011-10-04 LAB — BASIC METABOLIC PANEL
BUN: 13 mg/dL (ref 6–23)
Chloride: 103 mEq/L (ref 96–112)
GFR calc Af Amer: >90 mL/min (ref >90)
Glucose, Bld: 94 mg/dL (ref 70–99)
Potassium: 3.1 mEq/L — ABNORMAL LOW (ref 3.5–5.1)

## 2011-10-04 MED ORDER — ALBUTEROL SULFATE (5 MG/ML) 0.5% IN NEBU
2.5000 mg | INHALATION_SOLUTION | RESPIRATORY_TRACT | Status: DC | PRN
  Administered 2011-10-07: 2.5 mg via RESPIRATORY_TRACT
  Filled 2011-10-04 (×6): qty 0.5

## 2011-10-04 MED ORDER — PREDNISONE 20 MG PO TABS
40.0000 mg | ORAL_TABLET | Freq: Every day | ORAL | Status: DC
  Administered 2011-10-04 – 2011-10-05 (×2): 40 mg via ORAL
  Filled 2011-10-04 (×3): qty 2

## 2011-10-04 MED ORDER — HYDRALAZINE HCL 20 MG/ML IJ SOLN
10.0000 mg | Freq: Four times a day (QID) | INTRAMUSCULAR | Status: DC | PRN
  Administered 2011-10-13: 10 mg via INTRAVENOUS
  Filled 2011-10-04: qty 1

## 2011-10-04 MED ORDER — ZONISAMIDE 100 MG PO CAPS: 100.0000 mg | ORAL_CAPSULE | Freq: Every day | ORAL | Status: DC

## 2011-10-04 MED ORDER — ALBUTEROL SULFATE (5 MG/ML) 0.5% IN NEBU
2.5000 mg | INHALATION_SOLUTION | Freq: Four times a day (QID) | RESPIRATORY_TRACT | Status: DC
  Administered 2011-10-04 (×3): 2.5 mg via RESPIRATORY_TRACT
  Filled 2011-10-04 (×4): qty 0.5

## 2011-10-04 MED ORDER — OSELTAMIVIR PHOSPHATE 75 MG PO CAPS
75.0000 mg | ORAL_CAPSULE | Freq: Two times a day (BID) | ORAL | Status: AC
  Administered 2011-10-04 – 2011-10-06 (×6): 75 mg via ORAL
  Filled 2011-10-04 (×6): qty 1

## 2011-10-04 MED ORDER — LAMOTRIGINE 200 MG PO TABS
200.0000 mg | ORAL_TABLET | Freq: Every day | ORAL | Status: DC
  Administered 2011-10-04 – 2011-10-10 (×6): 200 mg via ORAL
  Filled 2011-10-04 (×4): qty 1

## 2011-10-04 MED ORDER — LAMOTRIGINE 150 MG PO TABS
150.0000 mg | ORAL_TABLET | Freq: Every day | ORAL | Status: DC
  Administered 2011-10-05 – 2011-10-14 (×9): 150 mg via ORAL
  Filled 2011-10-04 (×2): qty 1

## 2011-10-04 MED ORDER — POTASSIUM CHLORIDE CRYS ER 20 MEQ PO TBCR
40.0000 meq | EXTENDED_RELEASE_TABLET | Freq: Two times a day (BID) | ORAL | Status: DC
  Administered 2011-10-04: 40 meq via ORAL
  Filled 2011-10-04 (×2): qty 2

## 2011-10-04 MED ORDER — LEVOFLOXACIN 500 MG PO TABS
500.0000 mg | ORAL_TABLET | Freq: Every day | ORAL | Status: AC
  Administered 2011-10-04 – 2011-10-08 (×5): 500 mg via ORAL
  Filled 2011-10-04 (×5): qty 1

## 2011-10-04 NOTE — Progress Notes (Signed)
PCP: Pamelia Hoit, MD, MD  Lives at [facility]  Brief HPI:  This is a 78 year old female resident of independent living center, who was diagnosed with influenza 09/26/2011. She presented to the Ed at that time and she refused admission and went back to the independent living center. She apparently also refused tamiflu. But it appears it was initiated at the SNF. The patient has not been doing well and 2 days ago she was moved to a more monitored bed. On the day of admission she became confused and was transported to the ER. History obtained mainly from the ER physician. During initial interview patient was alert but she was combative in her answers and refused to give any information. The patient does have a history of seizures. That being said there are some reports of her eyes rolled back of her head and vomiting.   Past medical history: Seizures, GERD (gastroesophageal reflux disease), Peripheral vascular disease, Anxiety, Hyperlipidemia, Hypertension, Cancer colon cancer-hx of chemo, Embolism - blood clot 1996 left breast, Bone infection of left hand 12/22/2008 secondary to injury, Melanoma metastatic, Hemorrhoids   Consultants: None so far  Procedures: None so far  Subjective: Patient denies any complaints at this time. Seems to think she is at her SNF. Knew who the president was. Did not know the date or month or year. Denies SOB, N/V/D or pain.  Objective: Vital signs in last 24 hours: Temp:  [98.3 F (36.8 C)-98.8 F (37.1 C)] 98.6 F (37 C) (01/05 0500) Pulse Rate:  [81-102] 93  (01/05 0500) Resp:  [18-21] 20  (01/05 0500) BP: (150-188)/(65-97) 170/80 mmHg (01/05 0500) SpO2:  [91 %-98 %] 93 % (01/05 0500) Weight:  [58.6 kg (129 lb 3 oz)] 129 lb 3 oz (58.6 kg) (01/04 2059) Weight change:  Last BM Date: 10/03/11  Intake/Output from previous day: 01/04 0701 - 01/05 0700 In: 579 [I.V.:579] Out: 300 [Urine:300] Intake/Output this shift:    General appearance:  alert, cooperative, appears stated age and no distress Head: Normocephalic, without obvious abnormality, atraumatic Eyes: negative Throat: lips, mucosa, and tongue normal; teeth and gums normal Neck: no adenopathy, no carotid bruit, no JVD, supple, symmetrical, trachea midline and thyroid not enlarged, symmetric, no tenderness/mass/nodules Resp: Few wheezes bilaterally, no definite crackles. Cardio: regular rate and rhythm, S1, S2 normal, no murmur, click, rub or gallop GI: soft, non-tender; bowel sounds normal; no masses,  no organomegaly Extremities: extremities normal, atraumatic, no cyanosis or edema Pulses: 2+ and symmetric Skin: Skin color, texture, turgor normal. No rashes or lesions or post surgical changes see in left leg Lymph nodes: Cervical, supraclavicular, and axillary nodes normal. Neurologic: Mental status: alertness: alert, orientation: president, affect: normal Cranial nerves: normal Motor: grossly normal  Lab Results:  Basename 10/04/11 0356 10/03/11 1520 10/03/11 1500  WBC 13.1* -- 15.0*  HGB 13.0 13.9 --  HCT 39.1 41.0 --  PLT 283 -- 270   BMET  Basename 10/04/11 0356 10/03/11 1520  NA 135 139  K 3.1* 3.3*  CL 103 104  CO2 22 --  GLUCOSE 94 122*  BUN 13 17  CREATININE 0.70 0.90  CALCIUM 8.6 --    Studies/Results: Ct Head Wo Contrast  10/03/2011  *RADIOLOGY REPORT*  Clinical Data: Sudden onset altered mental status.  History of hypertension.  History of melanoma.  CT HEAD WITHOUT CONTRAST  Technique:  Contiguous axial images were obtained from the base of the skull through the vertex without contrast.  Comparison: 01/31/2011.  Findings: There is no  evidence for acute infarction, intracranial hemorrhage, mass lesion, hydrocephalus, or extra-axial fluid.  Mild atrophy is present.  There is mild chronic microvascular ischemic change in the periventricular and subcortical white matter.  I see no areas of edema suspicious for metastatic disease.  The calvarium is  intact.  There is advanced vascular calcification.  Acute maxillary sinusitis on the right is observed.  Mastoid air cells are clear.  IMPRESSION: Stable appearing atrophy and small vessel disease from May 2012. No visible acute intracranial findings.  No evidence for metastatic disease on this noncontrast examination.  Original Report Authenticated By: Elsie Stain, M.D.   Dg Chest Portable 1 View  10/03/2011  *RADIOLOGY REPORT*  Clinical Data: Cough, chest pain, shortness of breath  PORTABLE CHEST - 1 VIEW  Comparison: 09/27/2011  Findings: Chronic interstitial markings/emphysematous changes. Stable calcified granuloma in the lateral right upper lobe. No pleural effusion or pneumothorax.  Cardiomediastinal silhouette is within normal limits.  Prior fixation of the left proximal humerus.  IMPRESSION: No evidence of acute cardiopulmonary disease.  Chronic interstitial markings/emphysematous changes.  Original Report Authenticated By: Charline Bills, M.D.    Medications:  Scheduled:   . enoxaparin  40 mg Subcutaneous Daily  . lamoTRIgine  150 mg Oral Daily  . lamoTRIgine  200 mg Oral q1800  . LORazepam  0.5 mg Intravenous Once  . metoprolol  50 mg Oral Daily  . olmesartan  20 mg Oral Daily  . olopatadine  1 drop Both Eyes Daily  . pantoprazole  40 mg Oral Daily  . sodium chloride  500 mL Intravenous Once  . zonisamide  100 mg Oral Daily  . DISCONTD: sodium chloride   Intravenous Once  . DISCONTD: lamoTRIgine  150-225 mg Oral Daily  . DISCONTD: lamoTRIgine  200 mg Oral q1800    Assessment/Plan:  Principal Problem:  *Encephalopathy Active Problems:  COPD (chronic obstructive pulmonary disease)  Hypertension  Hyperlipemia  Tobacco abuse  Seizure  PVD (peripheral vascular disease)  Personal history of colon cancer    1. Acute Encephalopathy: Probably from acute illness (influenza), dehydration. Seems better now. Reorient multiple times during the day.   2. Recent Influenza:  unclear if course of tamiflu completed or not. Will give for 3 more days.  3. Wheezing/COPD: No infiltrate on CXR. Patient reports history of smoking. Will initiate nebs, steroids, abx.  4. History of Seizure: Unclear if she had seizure at [facility]. EEG pending. Continue lamictal and zonisamide.  5. Hypokalemia: Replete  6. Low TSH: Check Free T4.  7. H/O HTN: Stable  8. Hypercholesterolemia: Statin  9. H/O GERD  10. Full Code. Does have Living Will.  11. DVT Prophylaxis   LOS: 1 day   [facility] Pager [phone] 10/04/2011, 9:15 AM

## 2011-10-04 NOTE — ED Provider Notes (Signed)
Medical screening examination/treatment/procedure(s) were conducted as a shared visit with non-physician practitioner(s) and myself.  I personally evaluated the patient during the encounter  Recent ED visit 12/28 for COPD and influenza, patient was hypoxic but refused admission. Now sent from ECF with AMS, anorexia, weakness.  Alert, follows some commands, give short answers. Bilateral rhonchi.  Glynn Octave, MD 10/04/11 1032

## 2011-10-05 LAB — BASIC METABOLIC PANEL
BUN: 7 mg/dL (ref 6–23)
CO2: 23 mEq/L (ref 19–32)
GFR calc non Af Amer: 82 mL/min — ABNORMAL LOW (ref >90)
Glucose, Bld: 114 mg/dL — ABNORMAL HIGH (ref 70–99)
Potassium: 3.6 mEq/L (ref 3.5–5.1)

## 2011-10-05 LAB — CBC
HCT: 36.5 % (ref 36.0–46.0)
Hemoglobin: 12.1 g/dL (ref 12.0–15.0)
MCHC: 33.2 g/dL (ref 30.0–36.0)
RBC: 4.34 MIL/uL (ref 3.87–5.11)

## 2011-10-05 LAB — TSH: TSH: 0.641 u[IU]/mL (ref 0.350–4.500)

## 2011-10-05 MED ORDER — ZONISAMIDE 100 MG PO CAPS
100.0000 mg | ORAL_CAPSULE | Freq: Every day | ORAL | Status: DC
  Administered 2011-10-05 – 2011-10-14 (×9): 100 mg via ORAL
  Filled 2011-10-05 (×11): qty 1

## 2011-10-05 MED ORDER — AMLODIPINE BESYLATE 5 MG PO TABS
5.0000 mg | ORAL_TABLET | Freq: Every day | ORAL | Status: DC
  Administered 2011-10-05 – 2011-10-06 (×2): 5 mg via ORAL
  Filled 2011-10-05 (×4): qty 1

## 2011-10-05 MED ORDER — HALOPERIDOL LACTATE 5 MG/ML IJ SOLN
1.0000 mg | Freq: Once | INTRAMUSCULAR | Status: AC
  Administered 2011-10-05: 1 mg via INTRAVENOUS

## 2011-10-05 MED ORDER — ALBUTEROL SULFATE (5 MG/ML) 0.5% IN NEBU
2.5000 mg | INHALATION_SOLUTION | Freq: Four times a day (QID) | RESPIRATORY_TRACT | Status: DC
  Administered 2011-10-05 – 2011-10-06 (×3): 2.5 mg via RESPIRATORY_TRACT
  Filled 2011-10-05 (×2): qty 0.5

## 2011-10-05 MED ORDER — POTASSIUM CHLORIDE CRYS ER 20 MEQ PO TBCR
20.0000 meq | EXTENDED_RELEASE_TABLET | Freq: Once | ORAL | Status: AC
  Administered 2011-10-05: 20 meq via ORAL
  Filled 2011-10-05: qty 1

## 2011-10-05 MED ORDER — ENSURE IMMUNE HEALTH PO LIQD
237.0000 mL | Freq: Every day | ORAL | Status: DC
  Administered 2011-10-05 – 2011-10-11 (×6): 237 mL via ORAL

## 2011-10-05 MED ORDER — HALOPERIDOL LACTATE 5 MG/ML IJ SOLN
1.0000 mg | Freq: Four times a day (QID) | INTRAMUSCULAR | Status: DC | PRN
  Administered 2011-10-06 – 2011-10-09 (×4): 1 mg via INTRAVENOUS
  Filled 2011-10-05 (×5): qty 1

## 2011-10-05 MED ORDER — QUETIAPINE 12.5 MG HALF TABLET
12.5000 mg | ORAL_TABLET | Freq: Every day | ORAL | Status: DC
  Administered 2011-10-05 – 2011-10-10 (×5): 12.5 mg via ORAL
  Filled 2011-10-05 (×7): qty 1

## 2011-10-05 NOTE — Progress Notes (Signed)
PCP: Pamelia Hoit, MD, MD  Lives at [facility]  Brief HPI:  This is a 78 year old female resident of independent living center, who was diagnosed with influenza 09/26/2011. She presented to the Ed at that time and she refused admission and went back to the independent living center. She apparently also refused tamiflu. But it appears it was initiated at the SNF. The patient has not been doing well and 2 days ago she was moved to a more monitored bed. On the day of admission she became confused and was transported to the ER. History obtained mainly from the ER physician. During initial interview patient was alert but she was combative in her answers and refused to give any information. The patient does have a history of seizures. That being said there are some reports of her eyes rolled back of her head and vomiting.   Past medical history: Seizures, GERD (gastroesophageal reflux disease), Peripheral vascular disease, Anxiety, Hyperlipidemia, Hypertension, Cancer colon cancer-hx of chemo, Embolism - blood clot 1996 left breast, Bone infection of left hand 12/22/2008 secondary to injury, Melanoma metastatic, Hemorrhoids   Consultants: None so far  Procedures: None so far  Subjective: Patient denies any complaints at this time. Seems to be more confused this morning.   Objective: Vital signs in last 24 hours: Temp:  [98.1 F (36.7 C)-99.2 F (37.3 C)] 99.2 F (37.3 C) (01/06 0534) Pulse Rate:  [99-113] 113  (01/06 0534) Resp:  [18-20] 18  (01/06 0534) BP: (157-188)/(64-92) 188/64 mmHg (01/06 0534) SpO2:  [91 %-99 %] 91 % (01/06 0534) Weight change:  Last BM Date: 10/04/11  Intake/Output from previous day: 01/05 0701 - 01/06 0700 In: 2963.3 [P.O.:800; I.V.:2163.3] Out: 851 [Urine:850; Stool:1] Intake/Output this shift:    General appearance: alert, distracted today, appears stated age and no distress Head: Normocephalic, without obvious abnormality, atraumatic Eyes:  negative Neck: no adenopathy, no carotid bruit, no JVD, supple, symmetrical, trachea midline and thyroid not enlarged, symmetric, no tenderness/mass/nodules Resp: No wheezing today. Cardio: regular rate and rhythm, S1, S2 normal, no murmur, click, rub or gallop GI: soft, non-tender; bowel sounds normal; no masses,  no organomegaly Pulses: 2+ and symmetric Skin: Skin color, texture, turgor normal. No rashes or lesions or post surgical changes see in left leg Neurologic: Mental status: alertness: alert, orientation: none, affect: normal. Distracted. Cranial nerves: normal Motor: grossly normal  Lab Results:  Basename 10/05/11 0422 10/04/11 0356  WBC 20.9* 13.1*  HGB 12.1 13.0  HCT 36.5 39.1  PLT 258 283   BMET  Basename 10/05/11 0422 10/04/11 0356  NA 138 135  K 3.6 3.1*  CL 105 103  CO2 23 22  GLUCOSE 114* 94  BUN 7 13  CREATININE 0.68 0.70  CALCIUM 9.0 8.6    Studies/Results: Ct Head Wo Contrast  10/03/2011  *RADIOLOGY REPORT*  Clinical Data: Sudden onset altered mental status.  History of hypertension.  History of melanoma.  CT HEAD WITHOUT CONTRAST  Technique:  Contiguous axial images were obtained from the base of the skull through the vertex without contrast.  Comparison: 01/31/2011.  Findings: There is no evidence for acute infarction, intracranial hemorrhage, mass lesion, hydrocephalus, or extra-axial fluid.  Mild atrophy is present.  There is mild chronic microvascular ischemic change in the periventricular and subcortical white matter.  I see no areas of edema suspicious for metastatic disease.  The calvarium is intact.  There is advanced vascular calcification.  Acute maxillary sinusitis on the right is observed.  Mastoid air  cells are clear.  IMPRESSION: Stable appearing atrophy and small vessel disease from May 2012. No visible acute intracranial findings.  No evidence for metastatic disease on this noncontrast examination.  Original Report Authenticated By: Elsie Stain, M.D.   Dg Chest Portable 1 View  10/03/2011  *RADIOLOGY REPORT*  Clinical Data: Cough, chest pain, shortness of breath  PORTABLE CHEST - 1 VIEW  Comparison: 09/27/2011  Findings: Chronic interstitial markings/emphysematous changes. Stable calcified granuloma in the lateral right upper lobe. No pleural effusion or pneumothorax.  Cardiomediastinal silhouette is within normal limits.  Prior fixation of the left proximal humerus.  IMPRESSION: No evidence of acute cardiopulmonary disease.  Chronic interstitial markings/emphysematous changes.  Original Report Authenticated By: Charline Bills, M.D.    Medications:  Scheduled:    . albuterol  2.5 mg Nebulization Q6H WA  . enoxaparin  40 mg Subcutaneous Daily  . lamoTRIgine  150 mg Oral Daily  . lamoTRIgine  200 mg Oral QPC supper  . levofloxacin  500 mg Oral Daily  . LORazepam  0.5 mg Intravenous Once  . metoprolol  50 mg Oral Daily  . olmesartan  20 mg Oral Daily  . olopatadine  1 drop Both Eyes Daily  . oseltamivir  75 mg Oral BID  . pantoprazole  40 mg Oral Daily  . potassium chloride  40 mEq Oral BID  . predniSONE  40 mg Oral Q breakfast  . zonisamide  100 mg Oral Daily  . DISCONTD: albuterol  2.5 mg Nebulization Q6H  . DISCONTD: lamoTRIgine  150 mg Oral Daily  . DISCONTD: lamoTRIgine  200 mg Oral q1800  . DISCONTD: zonisamide  100 mg Oral Daily    Assessment/Plan:  Principal Problem:  *Encephalopathy Active Problems:  COPD (chronic obstructive pulmonary disease)  Hypertension  Hyperlipemia  Tobacco abuse  Seizure  PVD (peripheral vascular disease)  Personal history of colon cancer    1. Acute Encephalopathy: Slight worsening today probably due to steroids. Also from acute illness (influenza), dehydration. Reorient multiple times during the day. Will try to taper steroids quickly. Seroqul and haldol PRn. QTc normal. Other work up negative so far.  2. Recent Influenza: unclear if course of tamiflu completed or not.  Will give for 3 more days. Leukocytosis probably from steroids. Afebrile. Monitor.  3. Wheezing/COPD: Improvement with nebs, steroids, abx. No infiltrate on CXR. Patient reports history of smoking.   4. History of Seizure: Unclear if she had seizure at SNF. EEG pending. Continue lamictal and zonisamide.  5. Hypokalemia: Repleted  6. Low TSH: Free T4 pending.  7. H/O HTN: BP running high. ? Sec to steroids. Add norvasc.  8. Hypercholesterolemia: Statin  9. H/O GERD  10. Full Code. Does have Living Will.  11. DVT Prophylaxis  Back to SNF when stable. PT/OT.   LOS: 2 days   [facility] Pager [phone] 10/05/2011, 8:27 AM

## 2011-10-05 NOTE — Progress Notes (Signed)
INITIAL ADULT NUTRITION ASSESSMENT Date: 10/05/2011   Time: 10:07 AM Reason for Assessment: Consult for weight loss  ASSESSMENT: Female 78 y.o.  Patient in a state of confusion at time of RD visit. Patient break fast tray present, ate all of bagel and sausage but none of the oatmeal or fruit.  Spoke with RN, RN stated patient confused a lot. Discussed sending Ensure once daily, if patients likes will send more daily if PO intake remains poor.  Patient appears thin.   Dx: Encephalopathy  Hx:  Past Medical History  Diagnosis Date  . Seizures   . GERD (gastroesophageal reflux disease)   . Peripheral vascular disease   . Anxiety   . Hyperlipidemia   . Hypertension   . Cancer     colon cancer-hx of chemo  . Embolism - blood clot 1996    left breast  . Bone infection of left hand 12/22/2008    secondary to injury  . Melanoma     metastatic  . Hemorrhoids    Related Meds:  Scheduled Meds:   . albuterol  2.5 mg Nebulization Q6H WA  . amLODipine  5 mg Oral Daily  . enoxaparin  40 mg Subcutaneous Daily  . haloperidol lactate  1 mg Intravenous Once  . lamoTRIgine  150 mg Oral Daily  . lamoTRIgine  200 mg Oral QPC supper  . levofloxacin  500 mg Oral Daily  . LORazepam  0.5 mg Intravenous Once  . metoprolol  50 mg Oral Daily  . olmesartan  20 mg Oral Daily  . olopatadine  1 drop Both Eyes Daily  . oseltamivir  75 mg Oral BID  . pantoprazole  40 mg Oral Daily  . potassium chloride  20 mEq Oral Once  . predniSONE  40 mg Oral Q breakfast  . QUEtiapine  12.5 mg Oral QHS  . zonisamide  100 mg Oral Daily  . DISCONTD: albuterol  2.5 mg Nebulization Q6H  . DISCONTD: lamoTRIgine  150 mg Oral Daily  . DISCONTD: lamoTRIgine  200 mg Oral q1800  . DISCONTD: potassium chloride  40 mEq Oral BID  . DISCONTD: zonisamide  100 mg Oral Daily  . DISCONTD: zonisamide  100 mg Oral Daily   Continuous Infusions:   . 0.9 % NaCl with KCl 20 mEq / L 75 mL/hr at 10/05/11 0537   PRN  Meds:.acetaminophen, acetaminophen, albuterol, alum & mag hydroxide-simeth, haloperidol lactate, hydrALAZINE, HYDROcodone-acetaminophen, ondansetron (ZOFRAN) IV, ondansetron  Ht: 5\' 4"  (162.6 cm)  Wt: 129 lb 3 oz (58.6 kg)  Ideal Wt: 54.7 kg  % Ideal Wt: 107.5%  Body mass index is 22.18 kg/(m^2).  Food/Nutrition Related Hx: unknown  Labs:  CMP     Component Value Date/Time   NA 138 10/05/2011 0422   K 3.6 10/05/2011 0422   CL 105 10/05/2011 0422   CO2 23 10/05/2011 0422   GLUCOSE 114* 10/05/2011 0422   BUN 7 10/05/2011 0422   CREATININE 0.68 10/05/2011 0422   CALCIUM 9.0 10/05/2011 0422   PROT 6.3 09/27/2011 0645   ALBUMIN 3.0* 09/27/2011 0645   AST 17 09/27/2011 0645   ALT 13 09/27/2011 0645   ALKPHOS 89 09/27/2011 0645   BILITOT 0.2* 09/27/2011 0645   GFRNONAA 82* 10/05/2011 0422   GFRAA >90 10/05/2011 0422   CBC    Component Value Date/Time   WBC 20.9* 10/05/2011 0422   RBC 4.34 10/05/2011 0422   HGB 12.1 10/05/2011 0422   HCT 36.5 10/05/2011 0422   PLT 258  10/05/2011 0422   MCV 84.1 10/05/2011 0422   MCH 27.9 10/05/2011 0422   MCHC 33.2 10/05/2011 0422   RDW 14.7 10/05/2011 0422   LYMPHSABS 0.8 10/03/2011 1500   MONOABS 1.1* 10/03/2011 1500   EOSABS 0.0 10/03/2011 1500   BASOSABS 0.0 10/03/2011 1500    Intake/Output Summary (Last 24 hours) at 10/05/11 1010 Last data filed at 10/05/11 0537  Gross per 24 hour  Intake 2403.3 ml  Output    700 ml  Net 1703.3 ml    Diet Order: Cardiac  Supplements/Tube Feeding: None at this time  IVF:    0.9 % NaCl with KCl 20 mEq / L Last Rate: 75 mL/hr at 10/05/11 0537    Estimated Nutritional Needs:   Kcal: 1450-1750 kcal Protein: 70-82 g protein Fluid: 1 ml per kcal  NUTRITION DIAGNOSIS: -Inadequate oral intake (NI-2.1).  Status: Ongoing  RELATED TO: health and mental status affecting food intake  AS EVIDENCE BY: patient with recent unintentional weight loss greater than 10 pounds within the last month and poor PO intake documented 15% at meals.     MONITORING/EVALUATION(Goals): Weight trends, labs, PO intake  1. Promote weight maintenance/ prevent weight loss 2. PO intake greater than or equal to 75% at meals and snacks.   EDUCATION NEEDS: -Education not appropriate at this time  INTERVENTION: 1. Provide Vanilla Ensure once daily. Provides 250 kcal and 9 grams of protein.  *will increase daily amount if patient likes this supplement  Dietitian #: 03/07/34  DOCUMENTATION CODES Per approved criteria  -Not Applicable    Iven Finn [facility] 10/05/2011, 10:07 AM

## 2011-10-06 ENCOUNTER — Inpatient Hospital Stay (HOSPITAL_COMMUNITY)
Admit: 2011-10-06 | Discharge: 2011-10-06 | Disposition: A | Discharge status: Home / Self Care | Payer: Medicare Other | Attending: Family Medicine | Admitting: Family Medicine

## 2011-10-06 ENCOUNTER — Inpatient Hospital Stay (HOSPITAL_COMMUNITY): Payer: Medicare Other

## 2011-10-06 LAB — GLUCOSE, CAPILLARY: Glucose-Capillary: 126 mg/dL — ABNORMAL HIGH (ref 70–99)

## 2011-10-06 LAB — CBC
HCT: 35.9 % — ABNORMAL LOW (ref 36.0–46.0)
Hemoglobin: 12 g/dL (ref 12.0–15.0)
MCH: 28.2 pg (ref 26.0–34.0)
MCV: 84.5 fL (ref 78.0–100.0)
Platelets: 257 10*3/uL (ref 150–400)
RBC: 4.25 MIL/uL (ref 3.87–5.11)
WBC: 21 10*3/uL — ABNORMAL HIGH (ref 4.0–10.5)

## 2011-10-06 LAB — BLOOD GAS, ARTERIAL
Acid-base deficit: 1.6 mmol/L (ref 0.0–2.0)
Bicarbonate: 21.7 mEq/L (ref 20.0–24.0)
O2 Saturation: 86.8 %
pO2, Arterial: 45.8 mmHg — ABNORMAL LOW (ref 80.0–100.0)

## 2011-10-06 MED ORDER — FLUTICASONE-SALMETEROL 250-50 MCG/DOSE IN AEPB
1.0000 | INHALATION_SPRAY | Freq: Two times a day (BID) | RESPIRATORY_TRACT | Status: DC
  Administered 2011-10-06 – 2011-10-09 (×7): 1 via RESPIRATORY_TRACT
  Filled 2011-10-06: qty 14

## 2011-10-06 MED ORDER — ALBUTEROL SULFATE (5 MG/ML) 0.5% IN NEBU
2.5000 mg | INHALATION_SOLUTION | RESPIRATORY_TRACT | Status: DC
  Administered 2011-10-06 – 2011-10-11 (×26): 2.5 mg via RESPIRATORY_TRACT
  Filled 2011-10-06 (×27): qty 0.5

## 2011-10-06 MED ORDER — METHYLPREDNISOLONE SODIUM SUCC 125 MG IJ SOLR
60.0000 mg | Freq: Four times a day (QID) | INTRAMUSCULAR | Status: DC
  Administered 2011-10-06 – 2011-10-09 (×10): 60 mg via INTRAVENOUS
  Filled 2011-10-06 (×18): qty 0.96

## 2011-10-06 MED ORDER — INSULIN ASPART 100 UNIT/ML ~~LOC~~ SOLN
0.0000 [IU] | Freq: Three times a day (TID) | SUBCUTANEOUS | Status: DC
  Administered 2011-10-08: 2 [IU] via SUBCUTANEOUS
  Administered 2011-10-08: 3 [IU] via SUBCUTANEOUS
  Administered 2011-10-08: 2 [IU] via SUBCUTANEOUS
  Administered 2011-10-09: 1 [IU] via SUBCUTANEOUS
  Administered 2011-10-09 – 2011-10-10 (×2): 2 [IU] via SUBCUTANEOUS
  Filled 2011-10-06: qty 3

## 2011-10-06 NOTE — Progress Notes (Signed)
Physical Therapy Evaluation Patient Details Name: Jessica [name] MRN: [id number] DOB: 04-21-1932 Today's Date: 10/06/2011 11:50-12:15 EVII  Pt limited today by AMS and decreased O2 sats with activity.  Anticipate she will need SNF at D/C  Problem List:  Patient Active Problem List  Diagnoses  . Bronchitis  . COPD (chronic obstructive pulmonary disease)  . Hypertension  . Hyperlipemia  . Tobacco abuse  . Fever and chills  . Diarrhea  . Encephalopathy  . Seizure  . PVD (peripheral vascular disease)  . Personal history of colon cancer    Past Medical History:  Past Medical History  Diagnosis Date  . Seizures   . GERD (gastroesophageal reflux disease)   . Peripheral vascular disease   . Anxiety   . Hyperlipidemia   . Hypertension   . Cancer     colon cancer-hx of chemo  . Embolism - blood clot 1996    left breast  . Bone infection of left hand 12/22/2008    secondary to injury  . Melanoma     metastatic  . Hemorrhoids    Past Surgical History:  Past Surgical History  Procedure Date  . C-sections 1957, 1959, 1961    x3  . Abdominal hysterectomy 1963  . Colon resection 1995  . Vocal cord injection 1996, 1997  . Breast biopsies     x2 on left, x 1 on right  . Hemorrhoid surgery   . Porta cath placement 1995  . Porta cath removal 1996  . Heart ablation 1997  . Bladder repair   . Left femoral artery stent 05/07/2004  . Melanoma removed from left leg 09/06/2004  . Melanoma removed from left groin 09/2004  . Skin graft to left leg 12/05/2004  . Orif left humerus fracture 03/27/2005  . Laser surgery to right eye 10/04/2007  . Removal of tumor in left knee 11/15/2010    PT Assessment/Plan/Recommendation PT Assessment Clinical Impression Statement: Pt limited by AMS and decreased O2 sats even with 4Liters of supplemental O2. Anticipate she may have a prolonged recovery and will benefit from continued PT at SNF at D/C prior to return home with husband PT  Recommendation/Assessment: Patient will need skilled PT in the acute care venue PT Problem List: Decreased strength;Decreased activity tolerance;Decreased balance;Decreased mobility;Decreased cognition;Decreased safety awareness PT Therapy Diagnosis : Difficulty walking;Generalized weakness;Altered mental status PT Plan PT Frequency: Min 3X/week PT Treatment/Interventions: Gait training;DME instruction;Functional mobility training;Therapeutic activities;Therapeutic exercise;Balance training;Cognitive remediation PT Recommendation Recommendations for Other Services: OT consult Follow Up Recommendations: Skilled nursing facility Equipment Recommended: Defer to next venue PT Goals  Acute Rehab PT Goals PT Goal Formulation: Patient unable to participate in goal setting Time For Goal Achievement: 2 weeks Pt will go Sit to Stand: with supervision PT Goal: Sit to Stand - Progress: Partly met Pt will Ambulate: >150 feet;with supervision;with least restrictive assistive device PT Goal: Ambulate - Progress: Not met  PT Evaluation Precautions/Restrictions  Precautions Precautions: Fall Prior Functioning  Home Living Lives With: Spouse Type of Home: Independent living facility ([facility]) Home Layout: One level Home Access: Level entry Additional Comments: pt was independent priot to 12/28.  she was d/c'd to home with spouse with HHPT and was readmitted to [facility] 10/03/11 Prior Function Level of Independence: Independent with basic ADLs;Independent with gait Cognition Cognition Arousal/Alertness: Lethargic Overall Cognitive Status: Impaired Orientation Level: Disoriented X4 Following Commands: Follows one step commands inconsistently Safety/Judgement: Decreased awareness of safety precautions Decreased Safety/Judgement: Decreased awareness of need for assistance Awareness of Errors:  Decreased awareness of errors made Awareness of Deficits: Decreased awareness of deficits Executive  Functioning: unable to address problems  Sensation/Coordination   Extremity Assessment RLE Assessment RLE Assessment: Within Functional Limits LLE Assessment LLE Assessment: Within Functional Limits Mobility (including Balance) Bed Mobility Bed Mobility: Yes Supine to Sit: 4: Min assist Supine to Sit Details (indicate cue type and reason): Pt moving restlessly  in the bed, needs assist to guide patient safely to sit on edge of bed Transfers Sit to Stand: 4: Min assist Sit to Stand Details (indicate cue type and reason): multimodal cues for safe standing with RW Stand to Sit: 4: Min assist Ambulation/Gait Ambulation/Gait: Yes Ambulation/Gait Assistance: 3: Mod assist Ambulation/Gait Assistance Details (indicate cue type and reason): pt has diffuculty progressing gait with walker, unable to weight shift and advance feet, but is able to stand with RW.  Pt impulsive Assistive device: Rolling walker Gait Pattern: Decreased step length - right;Trunk flexed (pt keeps head down, not sure if she is focusing eyes) Gait velocity: slow  Posture/Postural Control Posture/Postural Control: Postural limitations Postural Limitations: thoracic kyphosis, forward head Exercise    End of Session PT - End of Session Equipment Utilized During Treatment:  (RW) Patient left: in bed (pt returned to be for EEG) Nurse Communication: Mobility status for transfers;Mobility status for ambulation (pt impusive, unsafe to be in chair unsupervised) General Behavior During Session: Restless Cognition: Impaired  Donnetta Hail 10/06/2011, 2:01 PM

## 2011-10-06 NOTE — Progress Notes (Signed)
PCP: Pamelia Hoit, MD, MD  Lives at [facility]  Brief HPI:  This is a 78 year old female resident of independent living center, who was diagnosed with influenza 09/26/2011. She presented to the Ed at that time and she refused admission and went back to the independent living center. She apparently also refused tamiflu. But it appears it was initiated at the SNF. The patient has not been doing well and 2 days ago she was moved to a more monitored bed. On the day of admission she became confused and was transported to the ER. History obtained mainly from the ER physician. During initial interview patient was alert but she was combative in her answers and refused to give any information. The patient does have a history of seizures. That being said there are some reports of her eyes rolled back of her head and vomiting.   Past medical history: Seizures, GERD (gastroesophageal reflux disease), Peripheral vascular disease, Anxiety, Hyperlipidemia, Hypertension, Cancer colon cancer-hx of chemo, Embolism - blood clot 1996 left breast, Bone infection of left hand 12/22/2008 secondary to injury, Melanoma metastatic, Hemorrhoids   Consultants: None so far  Procedures: None so far  Subjective: Overnight events noted. Patient short of breath. Confused. Denies pain.  Objective: Vital signs in last 24 hours: Temp:  [97.1 F (36.2 C)-98.3 F (36.8 C)] 97.1 F (36.2 C) (01/07 0422) Pulse Rate:  [92-129] 129  (01/07 0422) Resp:  [18-21] 21  (01/07 0422) BP: (124-178)/(60-88) 137/81 mmHg (01/07 0422) SpO2:  [93 %-95 %] 93 % (01/07 0422) Weight change:  Last BM Date: 10/04/11  Intake/Output from previous day: 01/06 0701 - 01/07 0700 In: 2127.2 [P.O.:940; I.V.:1187.2] Out: 350 [Urine:350] Intake/Output this shift:    General appearance:Confused Head: Normocephalic, without obvious abnormality, atraumatic Eyes: negative Resp: Diffuse end-exp wheezing with few crackles left base.    Cardio: Tachycardic, regular. no murmur, click, rub or gallop GI: soft, non-tender; bowel sounds normal; no masses,  no organomegaly Pulses: 2+ and symmetric Skin: Skin color, texture, turgor normal. No rashes or lesions or post surgical changes see in left leg Neurologic: Alert, awake. Confused. No focal deficits.  Lab Results:  [facility] 10/06/11 0534 10/05/11 0422  WBC 21.0* 20.9*  HGB 12.0 12.1  HCT 35.9* 36.5  PLT 257 258   BMET  Basename 10/05/11 0422 10/04/11 0356  NA 138 135  K 3.6 3.1*  CL 105 103  CO2 23 22  GLUCOSE 114* 94  BUN 7 13  CREATININE 0.68 0.70  CALCIUM 9.0 8.6    Studies/Results: No results found.  Medications:  Scheduled:    . albuterol  2.5 mg Nebulization Q4H  . amLODipine  5 mg Oral Daily  . enoxaparin  40 mg Subcutaneous Daily  . feeding supplement  237 mL Oral Q1200  . Fluticasone-Salmeterol  1 puff Inhalation BID  . haloperidol lactate  1 mg Intravenous Once  . insulin aspart  0-9 Units Subcutaneous TID WC  . lamoTRIgine  150 mg Oral Daily  . lamoTRIgine  200 mg Oral QPC supper  . levofloxacin  500 mg Oral Daily  . LORazepam  0.5 mg Intravenous Once  . methylPREDNISolone (SOLU-MEDROL) injection  60 mg Intravenous Q6H  . metoprolol  50 mg Oral Daily  . olmesartan  20 mg Oral Daily  . olopatadine  1 drop Both Eyes Daily  . oseltamivir  75 mg Oral BID  . pantoprazole  40 mg Oral Daily  . potassium chloride  20 mEq Oral Once  .  QUEtiapine  12.5 mg Oral QHS  . zonisamide  100 mg Oral Daily  . DISCONTD: albuterol  2.5 mg Nebulization Q6H WA  . DISCONTD: potassium chloride  40 mEq Oral BID  . DISCONTD: predniSONE  40 mg Oral Q breakfast  . DISCONTD: zonisamide  100 mg Oral Daily    Assessment/Plan:  Principal Problem:  *Encephalopathy Active Problems:  COPD (chronic obstructive pulmonary disease)  Hypertension  Hyperlipemia  Tobacco abuse  Seizure  PVD (peripheral vascular disease)  Personal history of colon  cancer    1. Worsening Dyspnea: Likely Acute COPD Exacerbation: Repeat CXR. Check ABG. Increase frequency of nebs. Increase steroids. Check swallow eval to r/o dysphagia/aspiration.  2. Acute Encephalopathy: Likely due to acute illness, hospital stay, meds. Monitor closely. Reorient multiple times during the day. Seroquel and haldol PRN. QTc normal. Other work up negative so far.  3. Recent Influenza: Completed course of tamiflu.   4. Leukocytosis probably from steroids. Afebrile. Monitor.  5. History of Seizure: Unclear if she had seizure at SNF. EEG pending. Continue lamictal and zonisamide.  6. Low TSH: Free T4 normal.  7. H/O HTN: BP better now. Monitor.  8. Hypercholesterolemia: Statin  9. H/O GERD  10. Full Code. Does have Living Will.  11. DVT Prophylaxis  Back to SNF when stable. PT/OT.   LOS: 3 days   [facility] Pager [phone] 10/06/2011, 8:34 AM

## 2011-10-06 NOTE — Procedures (Signed)
HISTORY:  A 78 year old female with altered mental status and history of seizures.  MEDICATIONS:  Valsartan, Toprol, Benicar, Lovenox, Lamictal, Norvasc and Apresoline.  CONDITIONS OF RECORDING:  This is a 16-channel EEG carried out with the patient in the awake and drowsy state.  DESCRIPTION:  The patient was uncooperative throughout the majority of the tracing and agitated.  Due to this fact,  follow awake muscle and movement artifact dominate the tracing.  There was some question of some underlying slowing over the right hemisphere with some polymorphic delta activity, but again muscle and movement artifact was prominent.  A mixture of frequencies was noted.  With drowse, there was noted some slowing of the background with some resolution of artifact.  No stage 2 sleep was noted.  Hypoventilation and intermittent photic stimulation were not performed.  IMPRESSION:  This is a technically difficult record secondary to the predominance of muscle and movement artifact.  No epileptiform activity was noted.  There was some question of a right hemispheric slowing, but clinical correlation is recommended, since artifact was prominent throughout the majority of the tracing.          ______________________________ Thana Farr, MD    [id number] D:  10/06/2011 18:00:13  T:  10/06/2011 21:13:38  Job #:  [id number]

## 2011-10-06 NOTE — Progress Notes (Signed)
Patient became very confused, restless, and agitated at approximately 0315 this morning. Patient trying to climb out of bed and has notable chest congestion and expiratory wheezing. PRN nebulizer give with fair effect. Haldol 1 mg IV give at approximately 0334 with fair effect. Still trying to get out of bed. O2 sats dropped to low 80's during this period. O2 currently at 2 liters but this was increased to 3 liters until o2 sats came back to the 90's after which it was decreased to 2 liters. Patient currently resting. Will closely monitor patient's oxygen level and for increased agitation.

## 2011-10-06 NOTE — Progress Notes (Signed)
*  PRELIMINARY RESULTS* EEG has been performed.  Jessica [name] 10/06/2011, 1:20 PM

## 2011-10-06 NOTE — Progress Notes (Signed)
Bed alarm on because pt continues to try and get out of bed by herself. Her mood changes frequently sometimes being friendly and cooperative to anxious and uncooperative, trying to get out of bed. She had a EEG and a swallow evaluation and CXR today.

## 2011-10-06 NOTE — Progress Notes (Signed)
Speech Language/Pathology Clinical/Bedside Swallow Evaluation Patient Details  Name: Jessica [name] MRN: [id number] DOB: [birth year] Today's Date: 10/06/2011 1401-1430 Past Medical History:  Past Medical History  Diagnosis Date  . Seizures   . GERD (gastroesophageal reflux disease)   . Peripheral vascular disease   . Anxiety   . Hyperlipidemia   . Hypertension   . Cancer     colon cancer-hx of chemo  . Embolism - blood clot 1996    left breast  . Bone infection of left hand 12/22/2008    secondary to injury  . Melanoma     metastatic  . Hemorrhoids    Past Surgical History:  Past Surgical History  Procedure Date  . C-sections 1957, 1959, 1961    x3  . Abdominal hysterectomy 1963  . Colon resection 1995  . Vocal cord injection 1996, 1997  . Breast biopsies     x2 on left, x 1 on right  . Hemorrhoid surgery   . Porta cath placement 1995  . Porta cath removal 1996  . Heart ablation 1997  . Bladder repair   . Left femoral artery stent 05/07/2004  . Melanoma removed from left leg 09/06/2004  . Melanoma removed from left groin 09/2004  . Skin graft to left leg 12/05/2004  . Orif left humerus fracture 03/27/2005  . Laser surgery to right eye 10/04/2007  . Removal of tumor in left knee 11/15/2010    Assessment/Recommendations/Treatment Plan    SLP Assessment Clinical Impression Statement: From minimal po intake SLP observed of pt consuming lunch, no clinical s/s of aspiration observed, nor increased work of breathing.  Pt does acknowledge h/o infrequent "choking" during meals requiring her to leave the meal table and expectorate secretions.  Pt denies recent pneumonias and reports not being worried about dysphagia.  Known h/o vocal fold surgery 1990, lipo injection and fasi lata injection 1997 from an otolaryngologist in [place] per pt.   Pt is on a PPI and acknowledges this manages her reflux symptoms.  SLP educated pt to tips to maximize airway protection given her COPD, h/o GERD  and vocal fold surgeries.  No further SLP warranted at this time and pt asked SLP not to come back as she "had answered all of these .  Please reorder if desire.  Thanks.  Other Related Risk Factors: History of GERD;History of pneumonia;Decreased respiratory status  Swallow Recommendations Solid Consistency: Regular (avoid particulate foods and mixed consistencies) Liquid Consistency: Thin Supervision: Patient able to self feed Compensations: Slow rate;Small sips/bites (take rest breaks if short of breathing or coughing with po) Postural Changes and/or Swallow Maneuvers: Seated upright 90 degrees;Upright 30-60 min after meal Oral Care Recommendations: Oral care BID  Treatment Plan Treatment Plan Recommendations: No treatment recommended at this time     Individuals Consulted Consulted and Agree with Results and Recommendations: Patient   Swallow Study Prior Functional Status  Type of Home: Independent living facility ([facility]) Lives With: Spouse Pt on a regular diet/thin prior to admission.   General   Pt admitted to [facility], tested + for influenza.  PMH + for COPD, seizures *pt sees Dr Sandria Manly, neuro*, melanoma metastatic, anxiety, colon ca s/p chemo, bone infection 2010, hemorrhoids.  MD ordered swallow eval due to concerns pt may be aspirating, coughing on liquids.    Dg Chest Port 1 View  10/06/2011  *RADIOLOGY REPORT*  Clinical Data: Cough.  Shortness of breath.  Confusion.  PORTABLE CHEST - 1 VIEW  Comparison: 10/03/2011  Findings: Emphysema  noted with accentuated interstitium and scattered increased nodularity particularly in the left lower lobe and left midlung.  Calcified granuloma noted above the minor fissure.  Biapical pleuroparenchymal scarring noted.  The heart size is within normal limits.  There is atherosclerotic calcification of the aorta.  Cannulated screw fixation of the left proximal humerus noted.  IMPRESSION:  1.  Increased interstitial accentuation with faint  nodularity in the left midlung and left lower lobe.  This could reflect atypical pneumonia or early pneumonia. Edema is considered less likely given the lack of significant cardiomegaly. 2.  Atherosclerosis. 3.  Emphysema.  Original Report Authenticated By: Dellia Cloud, M.D.     Oral Motor/Sensory Function  Overall Oral Motor/Sensory Function: Appears within functional limits for tasks assessed  Consistency Results  Ice Chips Ice chips: Not tested  Thin Liquid Thin Liquid: Within functional limits Presentation: Straw;Self Fed  Nectar Thick Liquid Nectar Thick Liquid: Not tested  Honey Thick Liquid Honey Thick Liquid: Not tested  Puree Puree: Not tested  Solid Solid: Within functional limits Presentation: Self Fed   Chales Abrahams 10/06/2011,2:51 PM

## 2011-10-06 NOTE — Progress Notes (Signed)
UR COMPLETED  

## 2011-10-07 DIAGNOSIS — J441 Chronic obstructive pulmonary disease with (acute) exacerbation: Secondary | ICD-10-CM | POA: Diagnosis not present

## 2011-10-07 LAB — CBC
HCT: 31.4 % — ABNORMAL LOW (ref 36.0–46.0)
Hemoglobin: 10.6 g/dL — ABNORMAL LOW (ref 12.0–15.0)
RDW: 15.1 % (ref 11.5–15.5)
WBC: 20.7 10*3/uL — ABNORMAL HIGH (ref 4.0–10.5)

## 2011-10-07 LAB — BASIC METABOLIC PANEL
BUN: 8 mg/dL (ref 6–23)
Chloride: 107 mEq/L (ref 96–112)
GFR calc Af Amer: >90 mL/min (ref >90)
Potassium: 3.6 mEq/L (ref 3.5–5.1)
Sodium: 138 mEq/L (ref 135–145)

## 2011-10-07 LAB — GLUCOSE, CAPILLARY: Glucose-Capillary: 140 mg/dL — ABNORMAL HIGH (ref 70–99)

## 2011-10-07 MED ORDER — AMLODIPINE BESYLATE 10 MG PO TABS
10.0000 mg | ORAL_TABLET | Freq: Every day | ORAL | Status: DC
  Administered 2011-10-07 – 2011-10-09 (×3): 10 mg via ORAL
  Filled 2011-10-07 (×4): qty 1

## 2011-10-07 NOTE — Progress Notes (Addendum)
Patient from [facility]. Family would like patient to go to SNF there. Patient will need auth from [facility] for SNF. Patient's Spouse lives in the Assisted living as well. When patient is cleared for discharge the social worker at the SNF is Jim Desanctis, [phone].  Jessica [name] MSW, LCSW [phone] Psychosocial assessment completed and placed in shadow chart. Fl-2 completed. CSW spoke with patients daughter, Jessica [name], she is agreeable to SNF at [facility].

## 2011-10-07 NOTE — Progress Notes (Addendum)
PCP: Pamelia Hoit, MD, MD  Lives at [facility]  Brief HPI:  This is a 78 year old female resident of independent living center, who was diagnosed with influenza 09/26/2011. She presented to the Ed at that time and she refused admission and went back to the independent living center. She apparently also refused tamiflu. But it appears it was initiated at the SNF. The patient has not been doing well and 2 days ago she was moved to a more monitored bed. On the day of admission she became confused and was transported to the ER. History obtained mainly from the ER physician. During initial interview patient was alert but she was combative in her answers and refused to give any information. The patient does have a history of seizures. That being said there were some reports of her eyes rolled back of her head and vomiting.   Past medical history: Seizures, GERD (gastroesophageal reflux disease), Peripheral vascular disease, Anxiety, Hyperlipidemia, Hypertension, Cancer colon cancer-hx of chemo, Embolism - blood clot 1996 left breast, Bone infection of left hand 12/22/2008 secondary to injury, Melanoma metastatic, Hemorrhoids   Consultants: None so far  Procedures: None so far  Subjective: Overnight events noted. Patient short of breath. Confused. Denies pain.  Objective: Vital signs in last 24 hours: Temp:  [97.5 F (36.4 C)-98.2 F (36.8 C)] 98.2 F (36.8 C) (01/07 2152) Pulse Rate:  [96-105] 105  (01/07 2152) Resp:  [18] 18  (01/07 2152) BP: (134-140)/(62-72) 138/72 mmHg (01/07 2152) SpO2:  [89 %-93 %] 92 % (01/08 0514) Weight change:  Last BM Date: 10/04/11  Intake/Output from previous day: 01/07 0701 - 01/08 0700 In: 240 [P.O.:240] Out: 900 [Urine:900] Intake/Output this shift:    General appearance:Confused but able to focus some. Head: Normocephalic, without obvious abnormality, atraumatic Eyes: negative Resp: reduced wheezing bilaterally. With few crackles left  base. Improved. Cardio: Less Tachycardic, regular. no murmur, click, rub or gallop GI: soft, non-tender; bowel sounds normal; no masses,  no organomegaly Pulses: 2+ and symmetric Skin: Skin color, texture, turgor normal. No rashes or lesions or post surgical changes see in left leg Neurologic: Alert, awake. Confused. No focal deficits.  Lab Results:  [facility] 10/07/11 0402 10/06/11 0534  WBC 20.7* 21.0*  HGB 10.6* 12.0  HCT 31.4* 35.9*  PLT 266 257   BMET  Basename 10/07/11 0402 10/05/11 0422  NA 138 138  K 3.6 3.6  CL 107 105  CO2 21 23  GLUCOSE 160* 114*  BUN 8 7  CREATININE 0.59 0.68  CALCIUM 8.9 9.0    Studies/Results: Dg Chest Port 1 View  10/06/2011  *RADIOLOGY REPORT*  Clinical Data: Cough.  Shortness of breath.  Confusion.  PORTABLE CHEST - 1 VIEW  Comparison: 10/03/2011  Findings: Emphysema noted with accentuated interstitium and scattered increased nodularity particularly in the left lower lobe and left midlung.  Calcified granuloma noted above the minor fissure.  Biapical pleuroparenchymal scarring noted.  The heart size is within normal limits.  There is atherosclerotic calcification of the aorta.  Cannulated screw fixation of the left proximal humerus noted.  IMPRESSION:  1.  Increased interstitial accentuation with faint nodularity in the left midlung and left lower lobe.  This could reflect atypical pneumonia or early pneumonia. Edema is considered less likely given the lack of significant cardiomegaly. 2.  Atherosclerosis. 3.  Emphysema.  Original Report Authenticated By: Dellia Cloud, M.D.    Medications:  Scheduled:    . albuterol  2.5 mg Nebulization Q4H  .  amLODipine  10 mg Oral Daily  . enoxaparin  40 mg Subcutaneous Daily  . feeding supplement  237 mL Oral Q1200  . Fluticasone-Salmeterol  1 puff Inhalation BID  . insulin aspart  0-9 Units Subcutaneous TID WC  . lamoTRIgine  150 mg Oral Daily  . lamoTRIgine  200 mg Oral QPC supper  .  levofloxacin  500 mg Oral Daily  . LORazepam  0.5 mg Intravenous Once  . methylPREDNISolone (SOLU-MEDROL) injection  60 mg Intravenous Q6H  . olmesartan  20 mg Oral Daily  . olopatadine  1 drop Both Eyes Daily  . oseltamivir  75 mg Oral BID  . pantoprazole  40 mg Oral Daily  . QUEtiapine  12.5 mg Oral QHS  . zonisamide  100 mg Oral Daily  . DISCONTD: amLODipine  5 mg Oral Daily  . DISCONTD: metoprolol  50 mg Oral Daily    Assessment/Plan:  Principal Problem:  *Acute exacerbation of chronic obstructive pulmonary disease (COPD) Active Problems:  COPD (chronic obstructive pulmonary disease)  Hypertension  Hyperlipemia  Tobacco abuse  Encephalopathy  Seizure  PVD (peripheral vascular disease)  Personal history of colon cancer    1. Acute COPD Exacerbation: Improved this morning. Continue current treatment for now. Can taper steroids from 1/9 if better. No evidence for dysphagia per SLP. Consider stopping metoprolol due to wheezing.  2. Acute Encephalopathy: Still with occ agitation. Likely due to acute illness, hospital stay, meds. Monitor closely. Reorient multiple times during the day. Seroquel and haldol PRN. QTc normal. Other work up negative so far. EEG: no seizure activity.  3. Recent Influenza: Completed course of tamiflu.   4. Leukocytosis probably from steroids. Afebrile. Monitor.  5. History of Seizure: Unclear if she had seizure at SNF. EEG report in Epic. Continue lamictal and zonisamide.  6. Low TSH: Free T4 normal.  7. H/O HTN: BP better now. Monitor. Increase dose of Amlodipine as I will stop BB.  8. Hypercholesterolemia: Statin  9. H/O GERD  10. Full Code. Does have Living Will.  11. DVT Prophylaxis  Back to SNF when stable. PT/OT.  Discussed with daughter Lynnell Chad at [phone]. Please call her daily with updates. She lives in [place].   LOS: 4 days   [facility] Pager [phone] 10/07/2011, 9:54  AM

## 2011-10-07 NOTE — Progress Notes (Signed)
Occupational Therapy Evaluation Patient Details Name: Jessica [name] MRN: [id number] DOB: Apr 21, 1932 Today's Date: 10/07/2011  Problem List:  Patient Active Problem List  Diagnoses  . Bronchitis  . COPD (chronic obstructive pulmonary disease)  . Hypertension  . Hyperlipemia  . Tobacco abuse  . Fever and chills  . Diarrhea  . Encephalopathy  . Seizure  . PVD (peripheral vascular disease)  . Personal history of colon cancer  . Acute exacerbation of chronic obstructive pulmonary disease (COPD)    Past Medical History:  Past Medical History  Diagnosis Date  . Seizures   . GERD (gastroesophageal reflux disease)   . Peripheral vascular disease   . Anxiety   . Hyperlipidemia   . Hypertension   . Cancer     colon cancer-hx of chemo  . Embolism - blood clot 1996    left breast  . Bone infection of left hand 12/22/2008    secondary to injury  . Melanoma     metastatic  . Hemorrhoids    Past Surgical History:  Past Surgical History  Procedure Date  . C-sections 1957, 1959, 1961    x3  . Abdominal hysterectomy 1963  . Colon resection 1995  . Vocal cord injection 1996, 1997  . Breast biopsies     x2 on left, x 1 on right  . Hemorrhoid surgery   . Porta cath placement 1995  . Porta cath removal 1996  . Heart ablation 1997  . Bladder repair   . Left femoral artery stent 05/07/2004  . Melanoma removed from left leg 09/06/2004  . Melanoma removed from left groin 09/2004  . Skin graft to left leg 12/05/2004  . Orif left humerus fracture 03/27/2005  . Laser surgery to right eye 10/04/2007  . Removal of tumor in left knee 11/15/2010    OT Assessment/Plan/Recommendation OT Assessment Clinical Impression Statement: This 78 y.o. female presents to OT with generalized weakness, balance deficits, and confusion.  Pt. will benefit from OT to maximize safety and independcnce with BADLs to allow pt. to return home with husband at supervision level after SNF level rehab OT  Recommendation/Assessment: Patient will need skilled OT in the acute care venue OT Problem List: Decreased strength;Decreased activity tolerance;Impaired balance (sitting and/or standing);Decreased cognition;Decreased safety awareness;Decreased knowledge of use of DME or AE;Cardiopulmonary status limiting activity Barriers to Discharge: None OT Therapy Diagnosis : Generalized weakness;Cognitive deficits OT Plan OT Frequency: Min 1X/week OT Treatment/Interventions: Self-care/ADL training;DME and/or AE instruction;Therapeutic activities;Cognitive remediation/compensation;Patient/family education;Balance training OT Recommendation Follow Up Recommendations: Skilled nursing facility Equipment Recommended: Defer to next venue Individuals Consulted Consulted and Agree with Results and Recommendations: Patient OT Goals Acute Rehab OT Goals OT Goal Formulation: With patient Time For Goal Achievement: 2 weeks ADL Goals Pt Will Perform Grooming: with min assist;Standing at sink ADL Goal: Grooming - Progress: Not met Pt Will Perform Upper Body Bathing: with supervision;Unsupported;Sitting, edge of bed ADL Goal: Upper Body Bathing - Progress: Not met Pt Will Perform Lower Body Bathing: with min assist;Sit to stand from bed ADL Goal: Lower Body Bathing - Progress: Not met Pt Will Transfer to Toilet: with min assist;Ambulation;Comfort height toilet ADL Goal: Toilet Transfer - Progress: Not met Pt Will Perform Toileting - Clothing Manipulation: with min assist;Standing (min guard assist) ADL Goal: Toileting - Clothing Manipulation - Progress: Not met  OT Evaluation Precautions/Restrictions  Precautions Precautions: Fall Restrictions Weight Bearing Restrictions: No Prior Functioning Home Living Lives With: Spouse Type of Home: Independent living facility Home Layout: One level Home  Access: Level entry Bathroom Shower/Tub: Health visitor: Handicapped height Bathroom  Accessibility: Yes How Accessible: Accessible via wheelchair Home Adaptive Equipment: Grab bars in shower;Grab bars around toilet Prior Function Level of Independence: Independent with basic ADLs;Independent with gait Driving: Yes ADL ADL Eating/Feeding: Simulated;Set up Where Assessed - Eating/Feeding: Bed level Grooming: Performed;Wash/dry face;Wash/dry hands;Brushing hair;Minimal assistance (for sitting balance EOB) Where Assessed - Grooming: Unsupported;Sitting, bed Upper Body Bathing: Simulated;Minimal assistance Where Assessed - Upper Body Bathing: Unsupported;Sitting, bed Lower Body Bathing: Simulated;Moderate assistance Where Assessed - Lower Body Bathing: Sit to stand from bed Upper Body Dressing: Simulated;Moderate assistance Where Assessed - Upper Body Dressing: Unsupported;Sitting, bed Lower Body Dressing: Simulated;Moderate assistance (donned socks with mod A for Lt. LE) Where Assessed - Lower Body Dressing: Sit to stand from bed Toilet Transfer: Simulated;Moderate assistance Toilet Transfer Method: Stand pivot Toilet Transfer Equipment: Bedside commode Toileting - Clothing Manipulation: Simulated;Moderate assistance Where Assessed - Toileting Clothing Manipulation: Standing Toileting - Hygiene: Simulated;Moderate assistance Where Assessed - Toileting Hygiene: Standing ADL Comments: Pt. moved to EOB with min guard assist.  Pt. loses balance to Lt. when performing ADL tasks EOB.  Pt. requires min A at times to recover.  Pt. confused throughout session.   Vision/Perception  Vision - Assessment Vision Assessment: Vision not tested Cognition Cognition Arousal/Alertness: Lethargic Overall Cognitive Status: Impaired Attention: Impaired Current Attention Level: Sustained Orientation Level: Oriented to person;Oriented to place;Disoriented to time;Disoriented to situation Following Commands: Follows one step commands consistently Safety/Judgement: Decreased safety judgement  for tasks assessed Decreased Safety/Judgement: Decreased awareness of need for assistance Awareness of Errors: Decreased awareness of errors made Decreased Awareness of Errors: Assistance required to correct errors made;Assistance required to identify errors made Awareness of Deficits: Decreased awareness of deficits Problem Solving: Requires assistance for problem solving Sensation/Coordination Sensation Light Touch: Appears Intact Coordination Gross Motor Movements are Fluid and Coordinated: Yes Fine Motor Movements are Fluid and Coordinated: Yes Extremity Assessment RUE Assessment RUE Assessment: Within Functional Limits LUE Assessment LUE Assessment: Within Functional Limits Mobility  Bed Mobility Bed Mobility: Yes Supine to Sit: 4: Min assist (min guard assist) Transfers Transfers: Yes Sit to Stand: 4: Min assist Sit to Stand Details (indicate cue type and reason): Pt. with wide BOS, and leans against bed.  Pt. with posterior bias and loss of balance posteriorly. Pt. becomes argumentative when asked to step away from bed. Stand to Sit: 4: Min assist Exercises   End of Session OT - End of Session Activity Tolerance: Patient limited by fatigue Patient left: in bed;with call bell in reach General Behavior During Session: Other (comment) (irritable) Cognition: Impaired   Seniah Lawrence, Ursula Alert M 10/07/2011, 4:05 PM

## 2011-10-08 ENCOUNTER — Inpatient Hospital Stay (HOSPITAL_COMMUNITY): Payer: Medicare Other

## 2011-10-08 LAB — GLUCOSE, CAPILLARY
Glucose-Capillary: 161 mg/dL — ABNORMAL HIGH (ref 70–99)
Glucose-Capillary: 151 mg/dL — ABNORMAL HIGH (ref 70–99)
Glucose-Capillary: 172 mg/dL — ABNORMAL HIGH (ref 70–99)
Glucose-Capillary: 158 mg/dL — ABNORMAL HIGH (ref 70–99)

## 2011-10-08 MED ORDER — SODIUM CHLORIDE 0.9 % IV SOLN
250.0000 mg | Freq: Four times a day (QID) | INTRAVENOUS | Status: DC
  Administered 2011-10-08 – 2011-10-11 (×11): 250 mg via INTRAVENOUS
  Filled 2011-10-08 (×16): qty 250

## 2011-10-08 MED ORDER — VANCOMYCIN HCL 500 MG IV SOLR
500.0000 mg | Freq: Two times a day (BID) | INTRAVENOUS | Status: DC
  Administered 2011-10-08 – 2011-10-11 (×6): 500 mg via INTRAVENOUS
  Filled 2011-10-08 (×8): qty 500

## 2011-10-08 NOTE — Progress Notes (Signed)
ANTIBIOTIC CONSULT NOTE - INITIAL  Pharmacy Consult for Primaxin/Vancomycin Indication: rule out pneumonia  Allergies  Allergen Reactions  . Aspirin   . Venlafaxine Other (See Comments)    seizures  . Penicillins Rash  . Zithromax (Azithromycin) Rash    Patient Measurements: Height: 5\' 4"  (162.6 cm) Weight: 129 lb 3 oz (58.6 kg) IBW/kg (Calculated) : 54.7   Vital Signs: Temp: 98.3 F (36.8 C) (01/09 1358) Temp src: Oral (01/09 1358) BP: 126/61 mmHg (01/09 1358) Pulse Rate: 91  (01/09 1358)  Labs:  Basename 10/07/11 0402 10/06/11 0534  WBC 20.7* 21.0*  HGB 10.6* 12.0  PLT 266 257  LABCREA -- --  CREATININE 0.59 --   Estimated Creatinine Clearance: 50.9 ml/min (by C-G formula based on Cr of 0.59).   Microbiology: Recent Results (from the past 720 hour(s))  CULTURE, BLOOD (ROUTINE X 2)     Status: Normal   Collection Time   09/26/11 11:55 AM      Component Value Range Status Comment   Specimen Description BLOOD LEFT ANTECUBITAL   Final    Special Requests BOTTLES DRAWN AEROBIC AND ANAEROBIC 44CC   Final    Setup Time [id number]   Final    Culture NO GROWTH 5 DAYS   Final    Report Status 10/02/2011 FINAL   Final   CULTURE, BLOOD (ROUTINE X 2)     Status: Normal   Collection Time   09/26/11 12:07 PM      Component Value Range Status Comment   Specimen Description BLOOD LEFT HAND   Final    Special Requests BOTTLES DRAWN AEROBIC AND ANAEROBIC 4CC   Final    Setup Time [id number]   Final    Culture NO GROWTH 5 DAYS   Final    Report Status 10/02/2011 FINAL   Final   CULTURE, SPUTUM-ASSESSMENT     Status: Normal   Collection Time   09/26/11  6:29 PM      Component Value Range Status Comment   Specimen Description SPUTUM   Final    Special Requests NONE   Final    Sputum evaluation     Final    Value: THIS SPECIMEN IS ACCEPTABLE. RESPIRATORY CULTURE REPORT TO FOLLOW.   Report Status 09/26/2011 FINAL   Final   CULTURE, RESPIRATORY     Status: Normal   Collection Time   09/26/11  6:29 PM      Component Value Range Status Comment   Specimen Description SPUTUM   Final    Special Requests NONE   Final    Gram Stain     Final    Value: FEW WBC PRESENT, PREDOMINANTLY PMN     NO SQUAMOUS EPITHELIAL CELLS SEEN     FEW GRAM POSITIVE COCCI     IN PAIRS   Culture NORMAL OROPHARYNGEAL FLORA   Final    Report Status 09/29/2011 FINAL   Final   URINE CULTURE     Status: Normal   Collection Time   09/26/11  6:30 PM      Component Value Range Status Comment   Specimen Description URINE, CLEAN CATCH   Final    Special Requests NONE   Final    Setup Time [id number]   Final    Colony Count >=100,000 COLONIES/ML   Final    Culture ENTEROCOCCUS SPECIES   Final    Report Status 09/28/2011 FINAL   Final    Organism ID, Bacteria ENTEROCOCCUS SPECIES   Final  CULTURE, BLOOD (ROUTINE X 2)     Status: Normal (Preliminary result)   Collection Time   10/03/11  2:55 PM      Component Value Range Status Comment   Specimen Description BLOOD LEFT AC   Final    Special Requests BOTTLES DRAWN AEROBIC AND ANAEROBIC 5 CC EACH   Final    Setup Time [id number]   Final    Culture     Final    Value:        BLOOD CULTURE RECEIVED NO GROWTH TO DATE CULTURE WILL BE HELD FOR 5 DAYS BEFORE ISSUING A FINAL NEGATIVE REPORT   Report Status PENDING   Incomplete   CULTURE, BLOOD (ROUTINE X 2)     Status: Normal (Preliminary result)   Collection Time   10/03/11  3:00 PM      Component Value Range Status Comment   Specimen Description BLOOD LEFT HAND   Final    Special Requests BOTTLES DRAWN AEROBIC AND ANAEROBIC 5 CC EACH   Final    Setup Time [id number]   Final    Culture     Final    Value:        BLOOD CULTURE RECEIVED NO GROWTH TO DATE CULTURE WILL BE HELD FOR 5 DAYS BEFORE ISSUING A FINAL NEGATIVE REPORT   Report Status PENDING   Incomplete     Medical History: Past Medical History  Diagnosis Date  . Seizures   . GERD (gastroesophageal reflux disease)   .  Peripheral vascular disease   . Anxiety   . Hyperlipidemia   . Hypertension   . Cancer     colon cancer-hx of chemo  . Embolism - blood clot 1996    left breast  . Bone infection of left hand 12/22/2008    secondary to injury  . Melanoma     metastatic  . Hemorrhoids     Medications:  Scheduled:    . albuterol  2.5 mg Nebulization Q4H  . amLODipine  10 mg Oral Daily  . enoxaparin  40 mg Subcutaneous Daily  . feeding supplement  237 mL Oral Q1200  . Fluticasone-Salmeterol  1 puff Inhalation BID  . insulin aspart  0-9 Units Subcutaneous TID WC  . lamoTRIgine  150 mg Oral Daily  . lamoTRIgine  200 mg Oral QPC supper  . levofloxacin  500 mg Oral Daily  . LORazepam  0.5 mg Intravenous Once  . methylPREDNISolone (SOLU-MEDROL) injection  60 mg Intravenous Q6H  . olmesartan  20 mg Oral Daily  . olopatadine  1 drop Both Eyes Daily  . pantoprazole  40 mg Oral Daily  . QUEtiapine  12.5 mg Oral QHS  . zonisamide  100 mg Oral Daily   Infusions:    . 0.9 % NaCl with KCl 20 mEq / L 75 mL/hr at 10/08/11 0416   Assessment: 79YOF to begin Primaxin and Vancomycin for suspected pneumonia.  Patient is afebrile with leukocytosis. SCr stable, CrCl(CG)~67ml/min, CrCl(N)~53 ml/ml - using SCr 1.0 for age>65yo.  1/4 Blood cultures ngtd.  Patient has a documented allergy to penicillin causing rash (severity: low).  Will continue to order for Primaxin per MD and f/u for any problems/reactions.  Goal of Therapy:  Vancomycin trough level 15-20 mcg/ml  Plan:  Vancomycin 500mg  IV q12h.  Will obtain trough at steady state. Primaxin 250mg  IV q6h. Follow SCr for dose adjustments. F/u culture results.   Clance Boll 10/08/2011,6:55 PM

## 2011-10-08 NOTE — Progress Notes (Signed)
Subjective: Patient seen and examined,confused and trying to get out of bed and pull her IV.  Objective: Vital signs in last 24 hours: Temp:  [98.1 F (36.7 C)-98.5 F (36.9 C)] 98.3 F (36.8 C) (01/09 1358) Pulse Rate:  [91-108] 91  (01/09 1358) Resp:  [20-22] 20  (01/09 1358) BP: (123-126)/(54-61) 126/61 mmHg (01/09 1358) SpO2:  [90 %-91 %] 91 % (01/09 1458) Weight change:  Last BM Date: 10/04/11  Intake/Output from previous day: 01/08 0701 - 01/09 0700 In: 120 [P.O.:120] Out: 125 [Urine:125] Total I/O In: 600 [P.O.:600] Out: -    Physical Exam: General: Alert, awake, confused,in no acute distress. HEENT: No bruits, no goiter. Heart: Regular rate and rhythm, without murmurs, rubs, gallops. Lungs:Scattered crackles,no wheezing.. Abdomen: Soft, nontender, nondistended, positive bowel sounds. Extremities: No clubbing cyanosis or edema with positive pedal pulses. Neuro: confused ,moving all extermities     Lab Results: Results for orders placed during the hospital encounter of 10/03/11 (from the past 24 hour(s))  GLUCOSE, CAPILLARY     Status: Abnormal   Collection Time   10/07/11  9:37 PM      Component Value Range   Glucose-Capillary 151 (*) 70 - 99 (mg/dL)   Comment 1 Notify RN    GLUCOSE, CAPILLARY     Status: Abnormal   Collection Time   10/08/11  7:33 AM      Component Value Range   Glucose-Capillary 172 (*) 70 - 99 (mg/dL)   Comment 1 Documented in Chart     Comment 2 Notify RN    GLUCOSE, CAPILLARY     Status: Abnormal   Collection Time   10/08/11 11:52 AM      Component Value Range   Glucose-Capillary 206 (*) 70 - 99 (mg/dL)   Comment 1 Documented in Chart     Comment 2 Notify RN    GLUCOSE, CAPILLARY     Status: Abnormal   Collection Time   10/08/11  5:26 PM      Component Value Range   Glucose-Capillary 182 (*) 70 - 99 (mg/dL)   Comment 1 Notify RN      Studies/Results: No results found.  Medications:    . albuterol  2.5 mg Nebulization Q4H  .  amLODipine  10 mg Oral Daily  . enoxaparin  40 mg Subcutaneous Daily  . feeding supplement  237 mL Oral Q1200  . Fluticasone-Salmeterol  1 puff Inhalation BID  . insulin aspart  0-9 Units Subcutaneous TID WC  . lamoTRIgine  150 mg Oral Daily  . lamoTRIgine  200 mg Oral QPC supper  . levofloxacin  500 mg Oral Daily  . LORazepam  0.5 mg Intravenous Once  . methylPREDNISolone (SOLU-MEDROL) injection  60 mg Intravenous Q6H  . olmesartan  20 mg Oral Daily  . olopatadine  1 drop Both Eyes Daily  . pantoprazole  40 mg Oral Daily  . QUEtiapine  12.5 mg Oral QHS  . zonisamide  100 mg Oral Daily    acetaminophen, acetaminophen, albuterol, alum & mag hydroxide-simeth, haloperidol lactate, hydrALAZINE, HYDROcodone-acetaminophen, ondansetron (ZOFRAN) IV, ondansetron     . 0.9 % NaCl with KCl 20 mEq / L 75 mL/hr at 10/08/11 0416    Assessment/Plan: Encephalopathy:etiology unclear  Still with occasional  agitation.  Seroquel and haldol PRN. CT head showed no acute events  Other  EEG: no seizure activity.will order MRI brain.order safety sitter Acute COPD Exacerbation: Improved.. Continue current treatment for now. taper steroids down Recent Influenza: Completed course of  tamiflu.  Leukocytosis ? from steroids. Afebrile. CXR 1/4 showed possible PNA ,will cover empirically with antibiotics and repeat CXR 2 VIEWS  History of Seizure:  Continue lamictal and zonisamide.   H/O WGN:[id number] . Disposition:Back to SNF when stable. PT/OT.    LOS: 5 days   Koleson Reifsteck 10/08/2011, 6:32 PM

## 2011-10-08 NOTE — Progress Notes (Signed)
Physical Therapy Treatment Patient Details Name: LYCIA SACHDEVA MRN: [id number] DOB: [birth year] Today's Date: 10/08/2011 Time: 7829-5621 Charge: Leonia Reeves PT Assessment/Plan  PT - Assessment/Plan Comments on Treatment Session: Pt not able to tolerate too much ambulation 2* SOB, fatigue, and decreased SaO2.  Pt required increased verbal cues to breathe in through nose.  Pt with decreased cognition today. PT Plan: Discharge plan remains appropriate;Frequency remains appropriate Follow Up Recommendations: Skilled nursing facility Equipment Recommended: Defer to next venue PT Goals  Acute Rehab PT Goals Pt will go Sit to Stand: with modified independence PT Goal: Sit to Stand - Progress: Updated due to goal met PT Goal: Ambulate - Progress: Progressing toward goal  PT Treatment Precautions/Restrictions  Precautions Precautions: Fall Restrictions Weight Bearing Restrictions: No Mobility (including Balance) Bed Mobility Bed Mobility: Yes Supine to Sit: 5: Supervision;HOB elevated (Comment degrees) Supine to Sit Details (indicate cue type and reason): verbal cues for safety Sit to Supine: 5: Supervision;HOB elevated (comment degrees) Sit to Supine - Details (indicate cue type and reason): verbal cues for safety Transfers Transfers: Yes Sit to Stand: 4: Min assist;With upper extremity assist;From bed Sit to Stand Details (indicate cue type and reason): min/guard for safety, verbal cues for technique Stand to Sit: 4: Min assist;With upper extremity assist;To bed Stand to Sit Details: min/guard, verbal cues for technique Ambulation/Gait Ambulation/Gait: Yes Ambulation/Gait Assistance: 4: Min assist Ambulation/Gait Assistance Details (indicate cue type and reason): min/guard, verbal cues to avoid objects, SaO2 on 4L pre-gait 86% so increased to 6L with ambulation and SaO2 83% upon return to sitting on bed, SaO2 89% on 4L upon leaving room, limited distance 2* fatigue and SOB, verbal cues  for posture and looking up Ambulation Distance (Feet): 50 Feet Assistive device: Rolling walker Gait Pattern: Step-through pattern;Trunk flexed    Exercise    End of Session PT - End of Session Equipment Utilized During Treatment: Gait belt Activity Tolerance: Patient limited by fatigue Patient left: in bed;with call bell in reach;with bed alarm set General Behavior During Session: [facility] for tasks performed Cognition: Impaired Cognitive Impairment: Pt with increased confusion and not orientated to person, place or situation.  Pt also talking nonsensical.  Pt trying to put [place] in nose with tubing and  already present in nose.  Pt seemed to be less confused on return to bed (less fidgeting with lines and saying "have a good rest of your day".)  Chavis Tessler,KATHrine E 10/08/2011, 4:06 PM Pager: [phone]

## 2011-10-09 ENCOUNTER — Encounter (HOSPITAL_COMMUNITY): Payer: Self-pay | Admitting: Anesthesiology

## 2011-10-09 ENCOUNTER — Inpatient Hospital Stay (HOSPITAL_COMMUNITY): Payer: Medicare Other

## 2011-10-09 DIAGNOSIS — J189 Pneumonia, unspecified organism: Secondary | ICD-10-CM

## 2011-10-09 DIAGNOSIS — J9600 Acute respiratory failure, unspecified whether with hypoxia or hypercapnia: Secondary | ICD-10-CM

## 2011-10-09 LAB — BLOOD GAS, ARTERIAL
Bicarbonate: 26.4 mEq/L — ABNORMAL HIGH (ref 20.0–24.0)
FIO2: 0.21 %
Patient temperature: 98.6
TCO2: 23.7 mmol/L (ref 0–100)
pCO2 arterial: 36.4 mmHg (ref 35.0–45.0)
pH, Arterial: 7.474 — ABNORMAL HIGH (ref 7.350–7.400)

## 2011-10-09 LAB — COMPREHENSIVE METABOLIC PANEL
Alkaline Phosphatase: 81 U/L (ref 39–117)
BUN: 20 mg/dL (ref 6–23)
CO2: 25 mEq/L (ref 19–32)
Chloride: 112 mEq/L (ref 96–112)
Creatinine, Ser: 0.61 mg/dL (ref 0.50–1.10)
GFR calc non Af Amer: 85 mL/min — ABNORMAL LOW (ref >90)
Glucose, Bld: 162 mg/dL — ABNORMAL HIGH (ref 70–99)
Potassium: 3.1 mEq/L — ABNORMAL LOW (ref 3.5–5.1)
Total Bilirubin: 0.4 mg/dL (ref 0.3–1.2)

## 2011-10-09 LAB — CBC
HCT: 30.8 % — ABNORMAL LOW (ref 36.0–46.0)
Hemoglobin: 10.5 g/dL — ABNORMAL LOW (ref 12.0–15.0)
MCV: 81.7 fL (ref 78.0–100.0)
RBC: 3.77 MIL/uL — ABNORMAL LOW (ref 3.87–5.11)
WBC: 25.4 10*3/uL — ABNORMAL HIGH (ref 4.0–10.5)

## 2011-10-09 LAB — GLUCOSE, CAPILLARY: Glucose-Capillary: 194 mg/dL — ABNORMAL HIGH (ref 70–99)

## 2011-10-09 MED ORDER — METHYLPREDNISOLONE SODIUM SUCC 125 MG IJ SOLR
60.0000 mg | Freq: Three times a day (TID) | INTRAMUSCULAR | Status: DC
  Filled 2011-10-09 (×2): qty 0.96

## 2011-10-09 MED ORDER — FUROSEMIDE 10 MG/ML IJ SOLN
20.0000 mg | Freq: Once | INTRAMUSCULAR | Status: AC
  Administered 2011-10-09: 20 mg via INTRAVENOUS
  Filled 2011-10-09: qty 2

## 2011-10-09 MED ORDER — MIDAZOLAM HCL 5 MG/ML IJ SOLN
1.0000 mg | INTRAMUSCULAR | Status: DC | PRN
  Administered 2011-10-09 – 2011-10-11 (×2): 2 mg via INTRAVENOUS
  Filled 2011-10-09 (×3): qty 1

## 2011-10-09 MED ORDER — GUAIFENESIN ER 600 MG PO TB12
600.0000 mg | ORAL_TABLET | Freq: Two times a day (BID) | ORAL | Status: DC | PRN
  Administered 2011-10-09: 600 mg via ORAL
  Filled 2011-10-09: qty 1

## 2011-10-09 MED ORDER — FENTANYL CITRATE 0.05 MG/ML IJ SOLN
25.0000 ug | INTRAMUSCULAR | Status: DC | PRN
  Administered 2011-10-09: 50 ug via INTRAVENOUS
  Administered 2011-10-09: 22:00:00 via INTRAVENOUS
  Administered 2011-10-10 (×2): 50 ug via INTRAVENOUS
  Filled 2011-10-09 (×3): qty 2

## 2011-10-09 MED ORDER — SUCCINYLCHOLINE CHLORIDE 20 MG/ML IJ SOLN
INTRAMUSCULAR | Status: DC | PRN
  Administered 2011-10-09: 80 mg via INTRAVENOUS

## 2011-10-09 MED ORDER — METHYLPREDNISOLONE SODIUM SUCC 125 MG IJ SOLR
80.0000 mg | Freq: Two times a day (BID) | INTRAMUSCULAR | Status: DC
  Administered 2011-10-10 – 2011-10-12 (×5): 80 mg via INTRAVENOUS
  Filled 2011-10-09 (×7): qty 1.28

## 2011-10-09 MED ORDER — POLYETHYLENE GLYCOL 3350 17 G PO PACK
17.0000 g | PACK | Freq: Every day | ORAL | Status: DC
  Administered 2011-10-10 – 2011-10-20 (×7): 17 g via ORAL
  Filled 2011-10-09 (×14): qty 1

## 2011-10-09 MED ORDER — PROPOFOL 10 MG/ML IV EMUL
5.0000 ug/kg/min | INTRAVENOUS | Status: DC
  Administered 2011-10-09: 5 ug/kg/min via INTRAVENOUS
  Administered 2011-10-09: 30 ug/kg/min via INTRAVENOUS
  Administered 2011-10-10: 70 ug/kg/min via INTRAVENOUS
  Administered 2011-10-10 (×2): 30 ug/kg/min via INTRAVENOUS
  Administered 2011-10-10: 70 ug/kg/min via INTRAVENOUS
  Administered 2011-10-10: 20 ug/kg/min via INTRAVENOUS
  Administered 2011-10-10: 70 ug/kg/min via INTRAVENOUS
  Administered 2011-10-11 (×2): 50 ug/kg/min via INTRAVENOUS
  Administered 2011-10-11 (×2): 60 ug/kg/min via INTRAVENOUS
  Filled 2011-10-09: qty 100

## 2011-10-09 MED ORDER — METHYLPREDNISOLONE SODIUM SUCC 125 MG IJ SOLR
60.0000 mg | Freq: Three times a day (TID) | INTRAMUSCULAR | Status: DC
  Administered 2011-10-09: 60 mg via INTRAVENOUS
  Filled 2011-10-09 (×6): qty 0.96

## 2011-10-09 MED ORDER — FENTANYL CITRATE 0.05 MG/ML IJ SOLN
50.0000 ug | Freq: Once | INTRAMUSCULAR | Status: AC
  Administered 2011-10-09: 50 ug via INTRAVENOUS

## 2011-10-09 MED ORDER — BISACODYL 10 MG RE SUPP
10.0000 mg | Freq: Once | RECTAL | Status: AC
  Administered 2011-10-09: 10 mg via RECTAL
  Filled 2011-10-09: qty 1

## 2011-10-09 MED ORDER — PANTOPRAZOLE SODIUM 40 MG PO PACK
40.0000 mg | PACK | Freq: Every day | ORAL | Status: DC
  Administered 2011-10-10 – 2011-10-11 (×2): 40 mg
  Filled 2011-10-09 (×4): qty 20

## 2011-10-09 MED ORDER — PROPOFOL 10 MG/ML IV EMUL
INTRAVENOUS | Status: AC
  Administered 2011-10-09: 5 ug/kg/min via INTRAVENOUS
  Filled 2011-10-09: qty 50

## 2011-10-09 MED ORDER — HEPARIN SODIUM (PORCINE) 5000 UNIT/ML IJ SOLN
5000.0000 [IU] | Freq: Three times a day (TID) | INTRAMUSCULAR | Status: DC
  Administered 2011-10-10 – 2011-10-20 (×32): 5000 [IU] via SUBCUTANEOUS
  Filled 2011-10-09 (×38): qty 1

## 2011-10-09 MED ORDER — POTASSIUM CHLORIDE 10 MEQ/100ML IV SOLN
10.0000 meq | INTRAVENOUS | Status: AC
  Administered 2011-10-09 (×4): 10 meq via INTRAVENOUS
  Filled 2011-10-09 (×4): qty 100

## 2011-10-09 MED ORDER — PROPOFOL 10 MG/ML IV BOLUS
INTRAVENOUS | Status: DC | PRN
  Administered 2011-10-09: 110 mg via INTRAVENOUS

## 2011-10-09 MED ORDER — FENTANYL CITRATE 0.05 MG/ML IJ SOLN
INTRAMUSCULAR | Status: AC
  Filled 2011-10-09: qty 2

## 2011-10-09 MED ORDER — PROPOFOL 10 MG/ML IV EMUL
INTRAVENOUS | Status: AC
  Filled 2011-10-09: qty 50

## 2011-10-09 NOTE — Progress Notes (Signed)
10/09/2011 At 1525 RN assessed patient saturation she was sating 87 to 88% on 4 liters [place]. RN notified MD about patient status and order were given for stat  blood gas on Room air. Biochemist, clinical.

## 2011-10-09 NOTE — Progress Notes (Addendum)
Subjective: I was informed by RN that patient is short of breath ,tachycardiac and hypoxemic with o2 sat in the 80s Rapid response was cllaed and they gave her neb treatment with improvement ,her o2 sat is now 88-89% and patient already feeling better .Her mental status is significantly better when compared to yesterday ,she is currently A,O X3 .   Objective: Vital signs in last 24 hours: Temp:  [98.1 F (36.7 C)-98.3 F (36.8 C)] 98.1 F (36.7 C) (01/10 0500) Pulse Rate:  [91-114] 109  (01/10 0500) Resp:  [16-22] 22  (01/10 0500) BP: (126-142)/(61-67) 142/67 mmHg (01/10 0500) SpO2:  [88 %-94 %] 94 % (01/10 1127) Weight change:  Last BM Date: 10/04/11  Intake/Output from previous day: 01/09 0701 - 01/10 0700 In: 600 [P.O.:600] Out: 250 [Urine:250] Total I/O In: 240 [P.O.:240] Out: -    Physical Exam: General: Alert, awake, oriented x3, in MILD  acute distress. Heart: Regular rate and rhythm, without murmurs, rubs, gallops. Lungs:coarse BS and scattered ronchi . Abdomen: Soft, nontender, nondistended, positive bowel sounds. Extremities: No clubbing cyanosis or edema with positive pedal pulses. Neuro: Grossly intact, nonfocal.    Lab Results: Results for orders placed during the hospital encounter of 10/03/11 (from the past 24 hour(s))  GLUCOSE, CAPILLARY     Status: Abnormal   Collection Time   10/08/11 11:52 AM      Component Value Range   Glucose-Capillary 206 (*) 70 - 99 (mg/dL)   Comment 1 Documented in Chart     Comment 2 Notify RN    GLUCOSE, CAPILLARY     Status: Abnormal   Collection Time   10/08/11  5:26 PM      Component Value Range   Glucose-Capillary 182 (*) 70 - 99 (mg/dL)   Comment 1 Notify RN    GLUCOSE, CAPILLARY     Status: Abnormal   Collection Time   10/08/11 10:37 PM      Component Value Range   Glucose-Capillary 158 (*) 70 - 99 (mg/dL)   Comment 1 Notify RN    CBC     Status: Abnormal   Collection Time   10/09/11  4:27 AM      Component Value  Range   WBC 25.4 (*) 4.0 - 10.5 (K/uL)   RBC 3.77 (*) 3.87 - 5.11 (MIL/uL)   Hemoglobin 10.5 (*) 12.0 - 15.0 (g/dL)   HCT 40.9 (*) 81.1 - 46.0 (%)   MCV 81.7  78.0 - 100.0 (fL)   MCH 27.9  26.0 - 34.0 (pg)   MCHC 34.1  30.0 - 36.0 (g/dL)   RDW 91.4  78.2 - 95.6 (%)   Platelets 322  150 - 400 (K/uL)  COMPREHENSIVE METABOLIC PANEL     Status: Abnormal   Collection Time   10/09/11  4:27 AM      Component Value Range   Sodium 145  135 - 145 (mEq/L)   Potassium 3.1 (*) 3.5 - 5.1 (mEq/L)   Chloride 112  96 - 112 (mEq/L)   CO2 25  19 - 32 (mEq/L)   Glucose, Bld 162 (*) 70 - 99 (mg/dL)   BUN 20  6 - 23 (mg/dL)   Creatinine, Ser 2.13  0.50 - 1.10 (mg/dL)   Calcium 8.9  8.4 - 08.6 (mg/dL)   Total Protein 5.9 (*) 6.0 - 8.3 (g/dL)   Albumin 2.2 (*) 3.5 - 5.2 (g/dL)   AST 23  0 - 37 (U/L)   ALT 31  0 - 35 (  U/L)   Alkaline Phosphatase 81  39 - 117 (U/L)   Total Bilirubin 0.4  0.3 - 1.2 (mg/dL)   GFR calc non Af Amer 85 (*) >90 (mL/min)   GFR calc Af Amer >90  >90 (mL/min)  GLUCOSE, CAPILLARY     Status: Abnormal   Collection Time   10/09/11  7:20 AM      Component Value Range   Glucose-Capillary 142 (*) 70 - 99 (mg/dL)    Studies/Results: Dg Chest 2 View  10/08/2011  *RADIOLOGY REPORT*  Clinical Data: Shortness of breath and weakness.  CHEST - 2 VIEW  Comparison: 10/06/2011  Findings: 1957 hours. The cardiopericardial silhouette is enlarged. Interstitial markings are diffusely coarsened with chronic features.  Worsening patchy airspace disease in the left mid and lower lung.  Probable small pleural effusions. Bones are diffusely demineralized.  IMPRESSION: Worsening left mid and lower lung airspace disease suggest progressing pneumonia.  Asymmetric edema is considered less likely.  Original Report Authenticated By: ERIC A. MANSELL, M.D.    Medications:    . albuterol  2.5 mg Nebulization Q4H  . amLODipine  10 mg Oral Daily  . bisacodyl  10 mg Rectal Once  . enoxaparin  40 mg  Subcutaneous Daily  . feeding supplement  237 mL Oral Q1200  . Fluticasone-Salmeterol  1 puff Inhalation BID  . furosemide  20 mg Intravenous Once  . imipenem-cilastatin  250 mg Intravenous Q6H  . insulin aspart  0-9 Units Subcutaneous TID WC  . lamoTRIgine  150 mg Oral Daily  . lamoTRIgine  200 mg Oral QPC supper  . levofloxacin  500 mg Oral Daily  . LORazepam  0.5 mg Intravenous Once  . methylPREDNISolone (SOLU-MEDROL) injection  60 mg Intravenous Q6H  . olmesartan  20 mg Oral Daily  . olopatadine  1 drop Both Eyes Daily  . pantoprazole  40 mg Oral Daily  . polyethylene glycol  17 g Oral Daily  . QUEtiapine  12.5 mg Oral QHS  . vancomycin  500 mg Intravenous Q12H  . zonisamide  100 mg Oral Daily    acetaminophen, acetaminophen, albuterol, alum & mag hydroxide-simeth, guaiFENesin, haloperidol lactate, hydrALAZINE, HYDROcodone-acetaminophen, ondansetron (ZOFRAN) IV, ondansetron     . 0.9 % NaCl with KCl 20 mEq / L 75 mL/hr at 10/08/11 2130    Assessment/Plan:  Encephalopathy:  significantly improved after starting antibiotics yesterday ,? Secondary to PNA ,patient is currently A,O X3 and neurological exam is unremarkable Acute COPD Exacerbation: hypoxemic today as above ,will  Check ABG ,continue with nebs and steroids ,I will also D/C IVF and give one dose of lasix  HCAP: continue antibiotics,nebsprn ,will also ask SPL to reevaluate since noticed by RN to have coughing episodes sometimes while taking po. .  Recent Influenza: Completed course of tamiflu.  Leukocytosis possibly secondary to PNA and  steroids ,afebrile ,cont to monitor  History of Seizure: Continue lamictal and zonisamide.  H/O AVW:[id number] .  HYPOKALEMIA:Replet,check mag Disposition:Back to SNF when stable. PT/OT. Code status: as listed in the chart as a full code ,that was clarified with patient again today and she wishes to stay as a full code . I called daughter and left voice message for her to call back  for updates.    LOS: 6 days   Micheal Murad 10/09/2011, 11:42 AM

## 2011-10-09 NOTE — Significant Event (Signed)
Rapid Response Event Note  Overview:      Initial Focused Assessment:   Interventions:   Event Summary:   at      at      [facility] to room 1342 by bedside RN, pt questionably lethargic and somewhat out of it. Pt admitted with new encephalopathy of unknown origin following a case of the flu, for which she was moved from independent living to a monitored bed at her facility.  VS 126/69 HR 114 ST RR 30 spo2 89% 4 L [place], lung sound diminished, and pt wheezing. RT initiated nebulizer treatment, and the pt began to become more alert, and stated "she did not want to live with artificial means' and requesting we remove the monitors and nebulizer.  Pt c/a/o x 3, responding more appropriately.  Vs 128/72 HR 104 RR 22, spo2 94% after nebulizer. Dr Cleotis Lema came to bedside and assessed pt and decided to leave her in her current room.    Rhina Brackett

## 2011-10-09 NOTE — Progress Notes (Signed)
Request for authorization for SNF sent to medicare blue. Evanee Lubrano C. Ran Tullis MSW, LCSW [phone]

## 2011-10-09 NOTE — Consult Note (Signed)
Name: Jessica [name] MRN: [id number] DOB: 02-01-1932    LOS: 6  PCCM CONSULTATION  NOTE  History of Present Illness: 70 yowf active smoker  [facility] with POS INFLU A rx with tamiflu and levaquin pta 1/4 with dx of pneumonia and worse mental status/ hypoxemia pm 1/10 so PCCM service asked to eval  Lines / Drains: None  Cultures: BC x 2  1/4 >>>  Antibiotics: Primaxin (?HCAP) 1/9 >>> Vanc (?HCAP) 1/9 >>>    Tests / Events:      Past Medical History  Diagnosis Date  . Seizures   . GERD (gastroesophageal reflux disease)   . Peripheral vascular disease   . Anxiety   . Hyperlipidemia   . Hypertension   . Cancer     colon cancer-hx of chemo  . Embolism - blood clot 1996    left breast  . Bone infection of left hand 12/22/2008    secondary to injury  . Melanoma     metastatic  . Hemorrhoids    Past Surgical History  Procedure Date  . C-sections 1957, 1959, 1961    x3  . Abdominal hysterectomy 1963  . Colon resection 1995  . Vocal cord injection 1996, 1997  . Breast biopsies     x2 on left, x 1 on right  . Hemorrhoid surgery   . Porta cath placement 1995  . Porta cath removal 1996  . Heart ablation 1997  . Bladder repair   . Left femoral artery stent 05/07/2004  . Melanoma removed from left leg 09/06/2004  . Melanoma removed from left groin 09/2004  . Skin graft to left leg 12/05/2004  . Orif left humerus fracture 03/27/2005  . Laser surgery to right eye 10/04/2007  . Removal of tumor in left knee 11/15/2010   Prior to Admission medications   Medication Sig Start Date End Date Taking? Authorizing Provider  cholecalciferol (VITAMIN D) 1000 UNITS tablet Take 2,000 Units by mouth daily.     Yes Historical Provider, MD  esomeprazole (NEXIUM) 40 MG capsule Take 40 mg by mouth 2 (two) times daily.     Yes Historical Provider, MD  estradiol (VIVELLE-DOT) 0.05 MG/24HR Place 1 patch onto the skin once a week.     Yes Historical Provider,  MD  lamoTRIgine (LAMICTAL) 150 MG tablet Take 150-200 mg by mouth daily. Take 150mg  every morning and 200mg  every afternoon.   Yes Historical Provider, MD  metoprolol (TOPROL-XL) 50 MG 24 hr tablet Take 50 mg by mouth daily.     Yes Historical Provider, MD  olopatadine (PATANOL) 0.1 % ophthalmic solution Place 1 drop into both eyes daily.     Yes Historical Provider, MD  Ethelda Chick (OYSTER CALCIUM PO) Take 1,500 mg by mouth 2 (two) times daily.     Yes Historical Provider, MD  simvastatin (ZOCOR) 20 MG tablet Take 20 mg by mouth at bedtime.     Yes Historical Provider, MD  valsartan (DIOVAN) 160 MG tablet Take 160 mg by mouth daily.     Yes Historical Provider, MD  zonisamide (ZONEGRAN) 100 MG capsule Take 100 mg by mouth daily.     Yes Historical Provider, MD  LORazepam (ATIVAN) 1 MG tablet Take 0.5-1 mg by mouth 2 (two) times daily as needed. For anxiety    Historical Provider, MD   Allergies Allergies  Allergen Reactions  . Aspirin   . Venlafaxine Other (See Comments)    seizures  .  Penicillins Rash  . Zithromax (Azithromycin) Rash    Family History History reviewed. No pertinent family history.  Social History  reports that she has been smoking.  She has never used smokeless tobacco. She reports that she does not drink alcohol or use illicit drugs.  Review Of Systems  11 points review of systems is negative with an exception of listed in HPI.  Vital Signs: Temp:  [97.7 F (36.5 C)-98.3 F (36.8 C)] 97.7 F (36.5 C) (01/10 1622) Pulse Rate:  [109-118] 118  (01/10 1622) Resp:  [16-22] 20  (01/10 1622) BP: (132-142)/(61-71) 137/71 mmHg (01/10 1622) SpO2:  [77 %-94 %] 85 % (01/10 1622) I/O last 3 completed shifts: In: 600 [P.O.:600] Out: 375 [Urine:375]   Physical Examination: General:  Chronically ill elderly wf moans ? Pain, not localizing Neuro: somnolent  HEENT:  Dry mucosa Neck:  Veins flat, no nodes   Cardiovascular:  St at 120, no s3 Lungs:  Rhonchi  bilaterally Abdomen:  Soft, not distended Musculoskeletal:  No deformities Skin:  No obvious rash or breakdown     CXR 10/08/11 Worsening left mid and lower lung airspace disease suggest  progressing pneumonia. Asymmetric edema is considered less likely.     Labs    Lab 10/09/11 0427 10/07/11 0402 10/05/11 0422  NA 145 138 138  K 3.1* 3.6 3.6  CL 112 107 105  CO2 25 21 23   BUN 20 8 7   CREATININE 0.61 0.59 0.68  GLUCOSE 162* 160* 114*    Lab 10/09/11 0427 10/07/11 0402 10/06/11 0534  HGB 10.5* 10.6* 12.0  HCT 30.8* 31.4* 35.9*  WBC 25.4* 20.7* 21.0*  PLT 322 266 257       Assessment and Plan:  HCAP/ acute resp failure in setting of INFLUENZA A, agree with broad coverage and 02  check pct/ esr/ probnp to be complete Stop norvasc since can theoretically make hypoxemia worse  Altered Mental status ? Baseline ? Component of TME  Best practices / Disposition:  -->full code though does have living will -->Heparin for DVT Px -->Protonix for GI Px    The patient is critically ill with multiple organ systems failure and requires high complexity decision making for assessment and support, frequent evaluation and titration of therapies, application of advanced monitoring technologies and extensive interpretation of multiple databases. Critical Care Time devoted to patient care services described in this note is 45 minutes.  Daughter Trula Ore lives in [place], [place], updated  Sandrea Hughs, MD Pulmonary and Critical Care Medicine [facility] Cell [phone]

## 2011-10-09 NOTE — Progress Notes (Signed)
eLink Physician-Brief Progress Note Patient Name: MERIAL MORITZ DOB: [birth year] MRN: [id number]  Date of Service  10/09/2011   HPI/Events of Note   WOB remains high on NIV  eICU Interventions  Proceed with intubation Hypoxia out of proportion to infiltrates, CT angio to r/o PE   Intervention Category Major Interventions: Respiratory failure - evaluation and management  Reynaldo Rossman V. 10/09/2011, 9:40 PM

## 2011-10-09 NOTE — Progress Notes (Signed)
Noitfied pt's daughter Wynona Canes and explained to her that her mother was being moved to stepdown for closer observation and management r/t her respiratory status. She verbalized her understanding and appreciation. She stated she lives in [place] and will be heading to the hospital later and to please call if status becomes more critical. Information passed off to [place], [place] in ICU.

## 2011-10-09 NOTE — Progress Notes (Addendum)
10/09/2011 RECEIVED CRITICAL BLOOD GAS FROM RESPIRATORY. RN DID NOTIFY MD ABOUT BLOOD GAS RESULT. MD DID NOT CALL BACK. RN DID LET NURSE TAKING OVER PATIENT CARE WHAT RESULT OF BLOOD GAS WAS. Kyrus Hyde RN

## 2011-10-09 NOTE — Progress Notes (Signed)
Pt continues to be arousable with touch. Unable to answer serial questions. Pt is confused. Currently on 4L/[place] with sats of 83%. Respiratory at bedside. Md aware of abg results and Orders given from Cleotis Lema, MD to transfer pt to stepdown as pt is still a full code.

## 2011-10-09 NOTE — Progress Notes (Addendum)
10/09/2011 patient was very lethargica, very sleepy this morning. At 11:00 am this morning she was still lethargic. Vital signs 145/62, 97.2 22, 84 on  4 liters [place] , 124. Rapid response and respiratory was called. Patient did received a breathing treating her saturation increase to 92% on 4 liters. MD was notified and aware.Biochemist, clinical.

## 2011-10-10 ENCOUNTER — Inpatient Hospital Stay (HOSPITAL_COMMUNITY): Payer: Medicare Other

## 2011-10-10 ENCOUNTER — Encounter (HOSPITAL_COMMUNITY): Payer: Self-pay | Admitting: Radiology

## 2011-10-10 LAB — PRO B NATRIURETIC PEPTIDE: Pro B Natriuretic peptide (BNP): 1039 pg/mL — ABNORMAL HIGH (ref 0–450)

## 2011-10-10 LAB — GLUCOSE, CAPILLARY
Glucose-Capillary: 157 mg/dL — ABNORMAL HIGH (ref 70–99)
Glucose-Capillary: 215 mg/dL — ABNORMAL HIGH (ref 70–99)
Glucose-Capillary: 130 mg/dL — ABNORMAL HIGH (ref 70–99)
Glucose-Capillary: 151 mg/dL — ABNORMAL HIGH (ref 70–99)

## 2011-10-10 LAB — BASIC METABOLIC PANEL
Chloride: 112 mEq/L (ref 96–112)
GFR calc Af Amer: >90 mL/min (ref >90)
GFR calc non Af Amer: 82 mL/min — ABNORMAL LOW (ref >90)
Potassium: 3.3 mEq/L — ABNORMAL LOW (ref 3.5–5.1)
Sodium: 147 mEq/L — ABNORMAL HIGH (ref 135–145)

## 2011-10-10 LAB — MAGNESIUM: Magnesium: 1.9 mg/dL (ref 1.5–2.5)

## 2011-10-10 LAB — BLOOD GAS, ARTERIAL
Acid-Base Excess: 1.3 mmol/L (ref 0.0–2.0)
Bicarbonate: 26.3 mEq/L — ABNORMAL HIGH (ref 20.0–24.0)
FIO2: 0.6 %
O2 Saturation: 96 %
pO2, Arterial: 84.1 mmHg (ref 80.0–100.0)

## 2011-10-10 LAB — CBC
HCT: 31.4 % — ABNORMAL LOW (ref 36.0–46.0)
Hemoglobin: 10.5 g/dL — ABNORMAL LOW (ref 12.0–15.0)
MCHC: 33.4 g/dL (ref 30.0–36.0)
RDW: 15.6 % — ABNORMAL HIGH (ref 11.5–15.5)
WBC: 24.2 10*3/uL — ABNORMAL HIGH (ref 4.0–10.5)

## 2011-10-10 LAB — LEGIONELLA ANTIGEN, URINE: Legionella Antigen, Urine: NEGATIVE

## 2011-10-10 LAB — STREP PNEUMONIAE URINARY ANTIGEN: Strep Pneumo Urinary Antigen: NEGATIVE

## 2011-10-10 LAB — PROCALCITONIN: Procalcitonin: 0.21 ng/mL

## 2011-10-10 LAB — SEDIMENTATION RATE: Sed Rate: 72 mm/hr — ABNORMAL HIGH (ref 0–22)

## 2011-10-10 MED ORDER — PROPOFOL 10 MG/ML IV EMUL
INTRAVENOUS | Status: AC
  Administered 2011-10-10: 70 ug/kg/min via INTRAVENOUS
  Filled 2011-10-10: qty 50

## 2011-10-10 MED ORDER — POTASSIUM CHLORIDE 10 MEQ/100ML IV SOLN
10.0000 meq | INTRAVENOUS | Status: AC
  Administered 2011-10-10 (×4): 10 meq via INTRAVENOUS
  Filled 2011-10-10: qty 400

## 2011-10-10 MED ORDER — CHLORHEXIDINE GLUCONATE 0.12 % MT SOLN
15.0000 mL | Freq: Two times a day (BID) | OROMUCOSAL | Status: DC
  Administered 2011-10-10 – 2011-10-16 (×14): 15 mL via OROMUCOSAL
  Filled 2011-10-10 (×16): qty 15

## 2011-10-10 MED ORDER — PROPOFOL 10 MG/ML IV EMUL
INTRAVENOUS | Status: AC
  Administered 2011-10-10: 30 ug/kg/min
  Filled 2011-10-10: qty 50

## 2011-10-10 MED ORDER — PROPOFOL 10 MG/ML IV EMUL
INTRAVENOUS | Status: AC
  Filled 2011-10-10: qty 50

## 2011-10-10 MED ORDER — SODIUM CHLORIDE 0.9 % IV SOLN
INTRAVENOUS | Status: DC
  Administered 2011-10-11: 10 mL/h via INTRAVENOUS

## 2011-10-10 MED ORDER — BIOTENE DRY MOUTH MT LIQD
15.0000 mL | Freq: Four times a day (QID) | OROMUCOSAL | Status: DC
  Administered 2011-10-10 – 2011-10-16 (×24): 15 mL via OROMUCOSAL

## 2011-10-10 MED ORDER — INSULIN ASPART 100 UNIT/ML ~~LOC~~ SOLN
0.0000 [IU] | SUBCUTANEOUS | Status: DC
  Administered 2011-10-10: 0 [IU] via SUBCUTANEOUS
  Administered 2011-10-10: 3 [IU] via SUBCUTANEOUS
  Administered 2011-10-10: 2 [IU] via SUBCUTANEOUS
  Administered 2011-10-11 – 2011-10-14 (×10): 1 [IU] via SUBCUTANEOUS
  Administered 2011-10-14: 2 [IU] via SUBCUTANEOUS

## 2011-10-10 MED ORDER — IOHEXOL 300 MG/ML  SOLN
100.0000 mL | Freq: Once | INTRAMUSCULAR | Status: AC | PRN
  Administered 2011-10-10: 100 mL via INTRAVENOUS

## 2011-10-10 NOTE — Progress Notes (Addendum)
PT Cancellation Note  _X__Treatment cancelled today due to medical issues with patient which prohibited therapy -- Pt intubated yesterday.  Will cancel tx today and check back on status of pt early next week.  ___ Treatment cancelled today due to patient receiving procedure or test   ___ Treatment cancelled today due to patient's refusal to participate   ___ Treatment cancelled today due to   Signature: Tamala Ser, PT, DPT Pager: [phone] 10/10/2011

## 2011-10-10 NOTE — Progress Notes (Signed)
eLink Physician-Brief Progress Note Patient Name: Jessica [name] DOB: [birth year] MRN: [id number]  Date of Service  10/10/2011   HPI/Events of Note  Routine Changes in Care   eICU Interventions  Change to ICU status NS at [facility]   Intervention Category Minor Interventions: Routine modifications to care plan (e.g. PRN medications for pain, fever)  Bich Mchaney 10/10/2011, 1:12 AM

## 2011-10-10 NOTE — Progress Notes (Signed)
Patient SOB and labored breathing worsened throughout the night.  Dr. Vassie Loll and Respiratory thought patient would need to be intubated.  Nurse called patients daughter and updated her on patients current condition.  Daughter came to hospital and was with patient at bedside shortly after intubation.  Daughter Jessica [name]) will try to talk with the doctors in morning.  Stephanie Coup, RN 10/10/11

## 2011-10-10 NOTE — Progress Notes (Addendum)
Nutrition Follow-up  Diet Order:  NPO  Pt has mostly remained confused with variable intake, 10-75% of meals.  She has been drinking Ensure vanilla, daily since Sunday, however yesterday developed worsening mental status, hypoxemia and was intubated. Tm:  36.9 MVe:  9.4   Diprivan @ 7 mL/hr providing 184 kcal/day  Needs re-estimated due to change in status.  New needs estimate:  1190-1320 kcal, 58-70 g protein  Meds: Scheduled Meds:   . albuterol  2.5 mg Nebulization Q4H  . antiseptic oral rinse  15 mL Mouth Rinse QID  . chlorhexidine  15 mL Mouth Rinse BID  . feeding supplement  237 mL Oral Q1200  . fentaNYL      . fentaNYL  50 mcg Intravenous Once  . furosemide  20 mg Intravenous Once  . heparin subcutaneous  5,000 Units Subcutaneous Q8H  . imipenem-cilastatin  250 mg Intravenous Q6H  . insulin aspart  0-9 Units Subcutaneous Q4H  . lamoTRIgine  150 mg Oral Daily  . lamoTRIgine  200 mg Oral QPC supper  . LORazepam  0.5 mg Intravenous Once  . methylPREDNISolone (SOLU-MEDROL) injection  80 mg Intravenous Q12H  . olmesartan  20 mg Oral Daily  . olopatadine  1 drop Both Eyes Daily  . pantoprazole sodium  40 mg Per Tube Q1200  . polyethylene glycol  17 g Oral Daily  . potassium chloride  10 mEq Intravenous Q1 Hr x 4  . potassium chloride  10 mEq Intravenous Q1 Hr x 4  . propofol      . propofol      . propofol      . QUEtiapine  12.5 mg Oral QHS  . vancomycin  500 mg Intravenous Q12H  . zonisamide  100 mg Oral Daily  . DISCONTD: amLODipine  10 mg Oral Daily  . DISCONTD: enoxaparin  40 mg Subcutaneous Daily  . DISCONTD: Fluticasone-Salmeterol  1 puff Inhalation BID  . DISCONTD: insulin aspart  0-9 Units Subcutaneous TID WC  . DISCONTD: methylPREDNISolone (SOLU-MEDROL) injection  60 mg Intravenous Q6H  . DISCONTD: methylPREDNISolone (SOLU-MEDROL) injection  60 mg Intravenous Q8H  . DISCONTD: methylPREDNISolone (SOLU-MEDROL) injection  60 mg Intravenous Q8H  . DISCONTD:  pantoprazole  40 mg Oral Daily   Continuous Infusions:   . sodium chloride 30 mL/hr at 10/10/11 0600  . propofol 20 mcg/kg/min (10/10/11 0939)  . DISCONTD: 0.9 % NaCl with KCl 20 mEq / L 75 mL/hr at 10/08/11 2130   PRN Meds:.acetaminophen, acetaminophen, albuterol, alum & mag hydroxide-simeth, fentaNYL, haloperidol lactate, hydrALAZINE, HYDROcodone-acetaminophen, iohexol, midazolam, ondansetron (ZOFRAN) IV, ondansetron, DISCONTD: guaiFENesin  Labs:  CMP     Component Value Date/Time   NA 147* 10/10/2011 0310   K 3.3* 10/10/2011 0310   CL 112 10/10/2011 0310   CO2 27 10/10/2011 0310   GLUCOSE 142* 10/10/2011 0310   BUN 22 10/10/2011 0310   CREATININE 0.68 10/10/2011 0310   CALCIUM 8.6 10/10/2011 0310   PROT 5.9* 10/09/2011 0427   ALBUMIN 2.2* 10/09/2011 0427   AST 23 10/09/2011 0427   ALT 31 10/09/2011 0427   ALKPHOS 81 10/09/2011 0427   BILITOT 0.4 10/09/2011 0427   GFRNONAA 82* 10/10/2011 0310   GFRAA >90 10/10/2011 0310     Intake/Output Summary (Last 24 hours) at 10/10/11 0953 Last data filed at 10/10/11 0600  Gross per 24 hour  Intake    953 ml  Output    595 ml  Net    358 ml    Weight  Status:  Stable, 129 lbs  Nutrition Dx:  Inadequate oral intake, ongoing  Intervention:   1.  Enteral nutrition; Initiate Jevity 1.2 @ 20 mL/hr continuous OGT.  Advance by 10 mL q 4 hrs to 40 mL/hr goal to provide 1320 kcal, 53g protein, 787 mL free water with current diprivan rate.  Monitor:   1.  Enteral nutrition; initiation with tolerance if pt to remain intubated >24 hrs.  Pt to meet 90-100% of needs while on diprivan   Hoyt Koch Pager #:  [phone]

## 2011-10-10 NOTE — Progress Notes (Signed)
Name: Jessica [name] MRN: [id number] DOB: 30-Oct-1931    LOS: 7  PCCM Progress   Note  Brief Profile:  79 yowf active smoker  [facility] with POS INFLU A rx with tamiflu and levaquin pta 1/4 with dx of pneumonia and worse mental status/ hypoxemia pm 1/10 so PCCM service asked to eval and required ET pm 1/10  Lines / Drains: Oral ET  1/10 >>>  Cultures: BC x 2  1/4 >>> MRSA Screen 1/10 >> Urine legionella 1/11>> Urine Strep 1/11 >>>  Antibiotics: Primaxin (?HCAP) 1/9 >>> Vanc (?HCAP) 1/9 >>>    Tests / Events: CT chest 1/11>> Emphysema with septal thickening compatible with pulmonary  edema and CHF.  3. Bilateral lower lobe collapse / consolidation and airspace  disease within the lingula which may represent alveolar edema or  pneumonia. Overall findings are compatible with multifocal  pneumonia.   Overnight Required et placement, no excessive secretions, sedated propofol    Vital Signs: Temp:  [97.4 F (36.3 C)-98.9 F (37.2 C)] 97.4 F (36.3 C) (01/11 0400) Pulse Rate:  [98-126] 104  (01/11 0500) Resp:  [16-35] 22  (01/11 0500) BP: (94-161)/(47-91) 122/57 mmHg (01/11 0500) SpO2:  [77 %-98 %] 91 % (01/11 0500) FiO2 (%):  [40 %-50.2 %] 50.2 % (01/11 0500) Weight:  [129 lb 3 oz (58.6 kg)] 129 lb 3 oz (58.6 kg) (01/10 2100) I/O last 3 completed shifts: In: 1493 [P.O.:240; I.V.:153; IV Piggyback:1100] Out: 845 [Urine:845]   Physical Examination: General:  Chronically ill elderly wf  Sedated on vent Neuro: moves all 4 ext spont  HEENT:  Dry mucosa Neck:  Veins flat, no nodes   Cardiovascular:  St at 120, no s3 Lungs:  Rhonchi bilaterally Abdomen:  Soft, not distended Musculoskeletal:  No deformities Skin:  No obvious rash or breakdown     CXR  1/11 Findings: Endotracheal tube and NG tube are unchanged. Mild  hyperinflation of the lungs. Patchy opacities throughout the left  lung, most confluent in the left lung base  again noted, unchanged.  The small left pleural effusion. Heart is normal size   Vent Mode:  [-] PRVC FiO2 (%):  [40 %-50.2 %] 50.2 % Set Rate:  [15 bmp] 15 bmp Vt Set:  [440 mL] 440 mL PEEP:  [5 cmH20] 5 cmH20 Plateau Pressure:  [14 cmH20] 14 cmH20  Labs    Lab 10/10/11 0310 10/09/11 0427 10/07/11 0402  NA 147* 145 138  K 3.3* 3.1* 3.6  CL 112 112 107  CO2 27 25 21   BUN 22 20 8   CREATININE 0.68 0.61 0.59  GLUCOSE 142* 162* 160*    Lab 10/10/11 0310 10/09/11 0427 10/07/11 0402  HGB 10.5* 10.5* 10.6*  HCT 31.4* 30.8* 31.4*  WBC 24.2* 25.4* 20.7*  PLT 343 322 266       Assessment and Plan:  HCAP/ acute resp failure in setting of INFLUENZA A, agree with broad coverage and 02 PCT 1/10 0.21 ESR 1/10 72   PBNP 1/10 1039 Pattern suggests post viral boop, continue solumedrol though originally started by [facility] for copd exac    Altered Mental status ? Baseline ? Component of TME  Best practices / Disposition:  -->full code though does have living will per daughter POA -->Heparin for DVT Px -->Protonix for GI Px   Hypokalemia   - Treated     The patient is critically ill with multiple organ systems failure and requires high complexity decision  making for assessment and support, frequent evaluation and titration of therapies, application of advanced monitoring technologies and extensive interpretation of multiple databases. Critical Care Time devoted to patient care services described in this note is 45 minutes.     Sandrea Hughs, MD Pulmonary and Critical Care Medicine [facility] Cell [phone]

## 2011-10-10 NOTE — Progress Notes (Signed)
eLink Physician-Brief Progress Note Patient Name: Jessica [name] DOB: [birth year] MRN: [id number]  Date of Service  10/10/2011   HPI/Events of Note   hypokalemia  eICU Interventions  Potassium replaced   Intervention Category Intermediate Interventions: Electrolyte abnormality - evaluation and management  DETERDING,ELIZABETH 10/10/2011, 5:33 AM

## 2011-10-10 NOTE — Progress Notes (Signed)
CSW met with daughter to offer support due to Pt's condition and admission to ICU. Daughter coping well, has good support to assist her. CSW provided supportive listening and will follow. Pt was to go to SNF at [facility] today.  Vennie Homans, [place] 10/10/2011 1:18 PM [phone]

## 2011-10-11 DIAGNOSIS — J441 Chronic obstructive pulmonary disease with (acute) exacerbation: Secondary | ICD-10-CM

## 2011-10-11 DIAGNOSIS — R4182 Altered mental status, unspecified: Secondary | ICD-10-CM

## 2011-10-11 LAB — GLUCOSE, CAPILLARY
Glucose-Capillary: 103 mg/dL — ABNORMAL HIGH (ref 70–99)
Glucose-Capillary: 119 mg/dL — ABNORMAL HIGH (ref 70–99)
Glucose-Capillary: 122 mg/dL — ABNORMAL HIGH (ref 70–99)
Glucose-Capillary: 124 mg/dL — ABNORMAL HIGH (ref 70–99)
Glucose-Capillary: 135 mg/dL — ABNORMAL HIGH (ref 70–99)

## 2011-10-11 MED ORDER — FENTANYL CITRATE 0.05 MG/ML IJ SOLN
25.0000 ug | INTRAMUSCULAR | Status: DC | PRN
  Administered 2011-10-11: 50 ug via INTRAVENOUS
  Filled 2011-10-11 (×2): qty 2

## 2011-10-11 MED ORDER — MIDAZOLAM HCL 5 MG/ML IJ SOLN
INTRAMUSCULAR | Status: AC
  Administered 2011-10-11: 14:00:00
  Filled 2011-10-11: qty 1

## 2011-10-11 MED ORDER — MIDAZOLAM HCL 5 MG/ML IJ SOLN
INTRAMUSCULAR | Status: AC
  Administered 2011-10-11: 2 mg via INTRAVENOUS
  Filled 2011-10-11: qty 1

## 2011-10-11 MED ORDER — PROPOFOL 10 MG/ML IV EMUL
INTRAVENOUS | Status: AC
  Administered 2011-10-11: 50 ug/kg/min via INTRAVENOUS
  Filled 2011-10-11: qty 50

## 2011-10-11 MED ORDER — HALOPERIDOL LACTATE 5 MG/ML IJ SOLN
2.0000 mg | INTRAMUSCULAR | Status: DC | PRN
  Administered 2011-10-11: 2 mg via INTRAVENOUS
  Filled 2011-10-11: qty 1

## 2011-10-11 MED ORDER — PROPOFOL 10 MG/ML IV EMUL: 5.0000 ug/kg/min | INTRAVENOUS | Status: DC

## 2011-10-11 MED ORDER — LEVOFLOXACIN IN D5W 750 MG/150ML IV SOLN
750.0000 mg | INTRAVENOUS | Status: DC
  Administered 2011-10-11 – 2011-10-14 (×4): 750 mg via INTRAVENOUS
  Filled 2011-10-11 (×5): qty 150

## 2011-10-11 MED ORDER — ALBUTEROL SULFATE (5 MG/ML) 0.5% IN NEBU
2.5000 mg | INHALATION_SOLUTION | Freq: Four times a day (QID) | RESPIRATORY_TRACT | Status: DC
  Administered 2011-10-11 – 2011-10-18 (×26): 2.5 mg via RESPIRATORY_TRACT
  Filled 2011-10-11 (×28): qty 0.5

## 2011-10-11 MED ORDER — MIDAZOLAM HCL 2 MG/2ML IJ SOLN: 1.0000 mg | INTRAMUSCULAR | Status: DC | PRN

## 2011-10-11 MED ORDER — ACETAMINOPHEN 160 MG/5ML PO SOLN: 650.0000 mg | Freq: Four times a day (QID) | ORAL | Status: DC | PRN

## 2011-10-11 NOTE — Progress Notes (Signed)
Jessica [name] is a 78 y.o. female smoker, resident of independent living center admitted on 10/03/2011 with pneumonia, hypoxemia, change in mental status.  Dx 12/28 with positive Influenza A.  Developed VDRF 1/10.  Has hx of seizures.  Line/tube: ETT 1/10 >>>  Cultures: 12/28 Influenza PCR>>Flu A positive BC x 2 1/4 >>>negative MRSA Screen 1/10 >>Negative Urine legionella 1/11>>  Urine Strep 1/11 >>>negative  Antibiotics: Primaxin (?HCAP) 1/9 >>>1/11  Vanc (?HCAP) 1/9 >>>1/11 Levaquin 1/11>>  Tests/events: 1/4 CT head>>no acute findings 1/7 EEG>>no epileptiform acitivity 1/9 MRI brain>>no acute findings 1/11 CT chest>>no PE, Lt > Rt lower lobe ASD/ATX, extensive changes of emphysema, interstitial edema pattern, mild mediastinal/hilar adenopathy 1/11 ESR>>72, BNP>>1039  SUBJECTIVE: Tolerating SBT.  Remains unresponsive.  OBJECTIVE:  Blood pressure 126/56, pulse 98, temperature 97.2 F (36.2 C), temperature source Axillary, resp. rate 16, height 5\' 4"  (1.626 m), weight 127 lb 10.3 oz (57.9 kg), SpO2 95.00%. Wt Readings from Last 3 Encounters:  10/11/11 127 lb 10.3 oz (57.9 kg)  09/26/11 138 lb 7.2 oz (62.8 kg)   Body mass index is 21.91 kg/(m^2).  I/O last 3 completed shifts: In: 1766 [I.V.:566; IV Piggyback:1200] Out: 1770 [Urine:1770]  Vent Mode:  [-] CPAP;PSV FiO2 (%):  [29.8 %-50.2 %] 30 % Set Rate:  [15 bmp] 15 bmp Vt Set:  [440 mL] 440 mL PEEP:  [4.5 cmH20-5 cmH20] 5 cmH20 Pressure Support:  [5 cmH20] 5 cmH20 Plateau Pressure:  [15 cmH20-16 cmH20] 15 cmH20  General - no distress HEENT - ETT in place Cardiac - s1s2 regular, no murmur Chest - prolonged exhalation, scattered rhonchi, no wheeze Abd - soft, nontender, + bowel sounds GU - foley in place Ext - no edema Neuro - has minimal withdrawal response to pain  BMET    Component Value Date/Time   NA 147* 10/10/2011 0310   K 3.3* 10/10/2011 0310   CL 112 10/10/2011 0310   CO2 27 10/10/2011 0310   GLUCOSE 142* 10/10/2011 0310   BUN 22 10/10/2011 0310   CREATININE 0.68 10/10/2011 0310   CALCIUM 8.6 10/10/2011 0310   GFRNONAA 82* 10/10/2011 0310   GFRAA >90 10/10/2011 0310    CBC    Component Value Date/Time   WBC 24.2* 10/10/2011 0310   RBC 3.80* 10/10/2011 0310   HGB 10.5* 10/10/2011 0310   HCT 31.4* 10/10/2011 0310   PLT 343 10/10/2011 0310   MCV 82.6 10/10/2011 0310   MCH 27.6 10/10/2011 0310   MCHC 33.4 10/10/2011 0310   RDW 15.6* 10/10/2011 0310   LYMPHSABS 0.8 10/03/2011 1500   MONOABS 1.1* 10/03/2011 1500   EOSABS 0.0 10/03/2011 1500   BASOSABS 0.0 10/03/2011 1500       Ct Angio Chest W/cm &/or Wo Cm  10/10/2011  *RADIOLOGY REPORT*  Clinical Data: Pulmonary embolism.  Hypoxemia.  Pneumonia.  CT ANGIOGRAPHY CHEST  Technique:  Multidetector CT imaging of the chest using the standard protocol during bolus administration of intravenous contrast. Multiplanar reconstructed images including MIPs were obtained and reviewed to evaluate the vascular anatomy.  Comparison: 10/09/2011.  Findings: Technically adequate study.  No pulmonary embolus.  Heart appears within normal limits.  No pericardial or pleural effusion. Coronary artery atherosclerosis is present. If office based assessment of coronary risk factors has not been performed, it is now recommended.  Incidental imaging the upper abdomen is within normal limits.  The stomach is decompressed with a nasogastric tube.  There is bilateral lower lobe collapse / consolidation.  Emphysema is present.  There is septal thickening extending to the apices, compatible with interstitial pulmonary edema superimposed on emphysema.  Mediastinal and bilateral hilar adenopathy is present which may be congestive or reactive.  Neoplasm is not completely excluded but not favored based on multiplicity of other findings.  Calcified granuloma is present in the inferior right upper lobe measuring 7 mm (image 42 series 7).  Tiny left pleural effusion is present.  Aortic  branch vessel atherosclerosis.  Thyroid gland grossly appears normal.  Hardware is present in the proximal left humerus compatible with prior ORIF for fracture.  No aggressive osseous lesions are identified.  Thoracic spondylosis with discal calcification.  IMPRESSION:  1.  Technically adequate study without pulmonary embolus.  No acute aortic abnormality. Atherosclerosis and coronary artery disease. 2.  Emphysema with septal thickening compatible with pulmonary edema and CHF. 3.  Bilateral lower lobe collapse / consolidation and airspace disease within the lingula which may represent alveolar edema or pneumonia.  Overall findings are compatible with multifocal pneumonia. 4.  Old granulomatous disease. 5.  Mediastinal and hilar adenopathy is probably reactive or congestive rather than neoplastic. 6.  Small left pleural effusion. 7.  Endotracheal tube and nasogastric tube appear in good position.  Original Report Authenticated By: Andreas Newport, M.D.   Mr Brain Wo Contrast  10/10/2011  *RADIOLOGY REPORT*  Clinical Data: 78 year old female in the ICU with encephalopathy. Altered mental status.  Pneumonia.  MRI HEAD WITHOUT CONTRAST  Technique:  Multiplanar, multiecho pulse sequences of the brain and surrounding structures were obtained according to standard protocol without intravenous contrast.  Comparison: Head CTs 10/03/2011 and earlier.  Findings: No restricted diffusion to suggest acute infarction.  No midline shift, mass effect, evidence of mass lesion, ventriculomegaly, extra-axial collection or acute intracranial hemorrhage.  Cervicomedullary junction and pituitary are within normal limits.  Major intracranial vascular flow voids are preserved.  Mild for age periventricular white matter T2 and FLAIR hyperintensity.  Mild for age T2 heterogeneity in the deep gray matter nuclei and brain stem.  Minimal T2 heterogeneity in the cerebellum. Probable incidental developmental venous anomaly in the right  cerebellar hemisphere.  Negative visualized cervical spine. Normal bone marrow signal.  Postoperative changes to the globes.  Minimal paranasal sinus mucosal thickening.  Minimal fluid signal in the mastoids. Negative scalp soft tissues.  IMPRESSION: No acute intracranial abnormality.  Mild for age nonspecific white and deep gray matter signal changes.  Original Report Authenticated By: Harley Hallmark, M.D.   Dg Chest Port 1 View  10/10/2011  *RADIOLOGY REPORT*  Clinical Data: Pneumonia.  PORTABLE CHEST - 1 VIEW  Comparison: 10/09/2011  Findings: Endotracheal tube and NG tube are unchanged.  Mild hyperinflation of the lungs.  Patchy opacities throughout the left lung, most confluent in the left lung base again noted, unchanged. The small left pleural effusion.  Heart is normal size.  IMPRESSION: No significant change.  Original Report Authenticated By: Cyndie Chime, M.D.   Dg Chest Port 1 View  10/09/2011  *RADIOLOGY REPORT*  Clinical Data: Endotracheal tube placement.  Enteric tube placement.  PORTABLE CHEST - 1 VIEW  Comparison: 10/09/2011.  Findings: Endotracheal tube is now present, with the tip 58 mm from the carina.  ORIF hardware is present in the proximal left humerus. Cardiopericardial silhouette is unchanged.  There is progressive airspace disease in the left hemithorax.  New left pleural effusion is present layering inferiorly.  The right basilar atelectasis with possible airspace disease.  IMPRESSION:  1.  Interval placement of endotracheal  tube and enteric tube. Proximal side port of the enteric tube is seen in the proximal gastric fundus. 2.  Development of left greater than right lung airspace disease and small left pleural effusion.  Original Report Authenticated By: Andreas Newport, M.D.   Dg Chest Port 1 View  10/09/2011  *RADIOLOGY REPORT*  Clinical Data: Follow-up pneumonia, weakness  PORTABLE CHEST - 1 VIEW  Comparison: Plain film 10/08/2011, PET CT 10/31/2010 of the  Findings: Stable  cardiac silhouette.  There are is a interstitial lung disease similar to prior.  There are nodules within the left upper lobe and right lower lobe may be increased compared to prior. Difficult to determine on the background of interstitial lung disease.  No acute pneumonia.  IMPRESSION:  1.  No clear acute cardiopulmonary process. 2.  Chronic interstitial lung disease. 3.  Question increased nodularity in the left upper lobe and right lower lobe.  Consider non emergent CT thorax to compared to prior PET CT scan 10/31/2010.  Original Report Authenticated By: Genevive Bi, M.D.    ASSESSMENT/PLAN:  Acute respiratory failure in setting of PNA with positive influenza A -Completed course of tamiflu -will narrow abx>>change to levaquin -wean on pressure support as tolerated>>mental status barrier to extubation -continue solumedrol for ?of post-viral BOOP -f/u CXR intermittently  AECOPD -continue solumedrol, scheduled bronchodilators  TME with hx of seizures -monitor off sedation>>change to prn sedation -if no improvement in next 24 hours, then may need neuro eval -continue lamictal, zonegran -d/c seroquel  Hx of HTN -continue meds  Protein calorie malnutrition -tube feeds while on vent  Critical care time 40 minutes  Faust Thorington Pager:  [phone] 10/11/2011, 10:41 AM

## 2011-10-11 NOTE — Progress Notes (Signed)
eLink Physician-Brief Progress Note Patient Name: Jessica [name] DOB: 10-04-1931 MRN: [id number]  Date of Service  10/11/2011   HPI/Events of Note   Unresponsive this am, weaned well, now agitated inspite of fent/ versed  eICU Interventions  Dc versed, use low dose propofol & Iv fentnayl  in hope of extubating soon   Intervention Category Minor Interventions: Agitation / anxiety - evaluation and management  Jessica [name] V. 10/11/2011, 4:23 PM

## 2011-10-11 NOTE — Progress Notes (Signed)
2220-Pt taken off BIPAP and placed on NRB mask.  Pt tolerating well at this time, RT to monitor and assess as needed.  MD aware of this change

## 2011-10-11 NOTE — Plan of Care (Signed)
Problem: Phase I Progression Outcomes Goal: Patient tolerating weaning plan Outcome: Progressing Patient weaned at 5/5 for approximately 4 hours this morning 10/11/11. She tolerated it well.

## 2011-10-12 ENCOUNTER — Inpatient Hospital Stay (HOSPITAL_COMMUNITY): Payer: Medicare Other

## 2011-10-12 DIAGNOSIS — R4182 Altered mental status, unspecified: Secondary | ICD-10-CM

## 2011-10-12 DIAGNOSIS — J9600 Acute respiratory failure, unspecified whether with hypoxia or hypercapnia: Secondary | ICD-10-CM

## 2011-10-12 DIAGNOSIS — J441 Chronic obstructive pulmonary disease with (acute) exacerbation: Secondary | ICD-10-CM

## 2011-10-12 DIAGNOSIS — J189 Pneumonia, unspecified organism: Secondary | ICD-10-CM

## 2011-10-12 LAB — BASIC METABOLIC PANEL
Chloride: 109 mEq/L (ref 96–112)
GFR calc Af Amer: >90 mL/min (ref >90)
Potassium: 3.7 mEq/L (ref 3.5–5.1)

## 2011-10-12 LAB — CBC
HCT: 34.9 % — ABNORMAL LOW (ref 36.0–46.0)
Hemoglobin: 11.4 g/dL — ABNORMAL LOW (ref 12.0–15.0)
RBC: 4.05 MIL/uL (ref 3.87–5.11)
WBC: 17.4 10*3/uL — ABNORMAL HIGH (ref 4.0–10.5)

## 2011-10-12 LAB — GLUCOSE, CAPILLARY
Glucose-Capillary: 121 mg/dL — ABNORMAL HIGH (ref 70–99)
Glucose-Capillary: 122 mg/dL — ABNORMAL HIGH (ref 70–99)
Glucose-Capillary: 103 mg/dL — ABNORMAL HIGH (ref 70–99)
Glucose-Capillary: 117 mg/dL — ABNORMAL HIGH (ref 70–99)

## 2011-10-12 MED ORDER — PANTOPRAZOLE SODIUM 40 MG IV SOLR
40.0000 mg | INTRAVENOUS | Status: DC
  Administered 2011-10-12 – 2011-10-14 (×3): 40 mg via INTRAVENOUS
  Filled 2011-10-12 (×3): qty 40

## 2011-10-12 MED ORDER — KCL IN DEXTROSE-NACL 20-5-0.45 MEQ/L-%-% IV SOLN
INTRAVENOUS | Status: DC
  Administered 2011-10-12: 11:00:00 via INTRAVENOUS
  Administered 2011-10-13: 40 mL/h via INTRAVENOUS
  Administered 2011-10-14 (×2): via INTRAVENOUS
  Administered 2011-10-15: 30 mL/h via INTRAVENOUS
  Filled 2011-10-12 (×7): qty 1000

## 2011-10-12 MED ORDER — METHYLPREDNISOLONE SODIUM SUCC 40 MG IJ SOLR
40.0000 mg | Freq: Two times a day (BID) | INTRAMUSCULAR | Status: DC
  Administered 2011-10-12 – 2011-10-14 (×4): 40 mg via INTRAVENOUS
  Filled 2011-10-12 (×6): qty 1

## 2011-10-12 NOTE — Progress Notes (Signed)
Pt assessed and is lethargic. She is able to follow commands and nods appropriately but will not speak unless prompted several times to answer questions. Her answers are one word responses and her voice remains hoarse.  Pt was oriented to person and place. Upon her daughter arriving, pt's demeanor became much more responsive although verbal communication remained limited to short sentences. Daughter at bedside. Daughter has stated that it takes her mother much longer to clear medications from her system than normal. Medications from when patient was intubated may still be on board. Will continue to monitor patient.

## 2011-10-12 NOTE — Plan of Care (Signed)
Dr. Herma Carson called and informed that pt was unable to take BP meds and anti-seizure meds today due to difficulty swallowing and being lethargic. BP is currently 160/ 67 (91) and has remained in the 160's systolic throughout the day. Dr. Herma Carson has indicated that he is aware of this and is OK with the patient having an increased BP for the time being. Will reassess pt in the morning to see if she is able to swallow medications. If pt remains unable to swallow, MD will consider placing a NG/OG tube for medication administration.

## 2011-10-12 NOTE — Progress Notes (Addendum)
Jessica [name] is a 78 y.o. female smoker, resident of independent living center admitted on 10/03/2011 with pneumonia, hypoxemia, change in mental status.  Dx 12/28 with positive Influenza A.  Developed VDRF 1/10.  Has hx of seizures.  Line/tube: ETT 1/10 >>>1/12 (self extubation)  Cultures: 12/28 Influenza PCR>>Flu A positive BC x 2 1/4 >>>negative MRSA Screen 1/10 >>Negative Urine legionella 1/11>>  Urine Strep 1/11 >>>negative  Antibiotics: Primaxin (?HCAP) 1/9 >>>1/11  Vanc (?HCAP) 1/9 >>>1/11 Levaquin 1/11>>  Tests/events: 1/4 CT head>>no acute findings 1/7 EEG>>no epileptiform acitivity 1/9 MRI brain>>no acute findings 1/11 CT chest>>no PE, Lt > Rt lower lobe ASD/ATX, extensive changes of emphysema, interstitial edema pattern, mild mediastinal/hilar adenopathy 1/11 ESR>>72, BNP>>1039  SUBJECTIVE: Respiratory status stable after extubation.  Remains somnolent, but more easily aroused.  OBJECTIVE:  Blood pressure 155/62, pulse 86, temperature 98.4 F (36.9 C), temperature source Axillary, resp. rate 18, height 5\' 4"  (1.626 m), weight 127 lb 10.3 oz (57.9 kg), SpO2 99.00%. Wt Readings from Last 3 Encounters:  10/11/11 127 lb 10.3 oz (57.9 kg)  09/26/11 138 lb 7.2 oz (62.8 kg)   Body mass index is 21.91 kg/(m^2).  I/O last 3 completed shifts: In: 1114.9 [I.V.:664.9; IV Piggyback:450] Out: 2700 [Urine:2700]  Vent Mode:  [-] BIPAP FiO2 (%):  [29.7 %-100 %] 100 % Set Rate:  [12 bmp] 12 bmp Vt Set:  [440 mL] 440 mL PEEP:  [5 cmH20] 5 cmH20 Plateau Pressure:  [13 cmH20-15 cmH20] 13 cmH20  General - no distress HEENT - dry oral mucosa Cardiac - s1s2 regular, no murmur Chest - prolonged exhalation, scattered rhonchi, no wheeze Abd - soft, nontender, + bowel sounds GU - foley in place Ext - no edema Neuro - has minimal withdrawal response to pain, tearful at times, moves extremities but not to command  BMET    Component Value Date/Time   NA 147* 10/12/2011  0322   K 3.7 10/12/2011 0322   CL 109 10/12/2011 0322   CO2 30 10/12/2011 0322   GLUCOSE 86 10/12/2011 0322   BUN 30* 10/12/2011 0322   CREATININE 0.65 10/12/2011 0322   CALCIUM 9.0 10/12/2011 0322   GFRNONAA 83* 10/12/2011 0322   GFRAA >90 10/12/2011 0322    CBC    Component Value Date/Time   WBC 17.4* 10/12/2011 0322   RBC 4.05 10/12/2011 0322   HGB 11.4* 10/12/2011 0322   HCT 34.9* 10/12/2011 0322   PLT 336 10/12/2011 0322   MCV 86.2 10/12/2011 0322   MCH 28.1 10/12/2011 0322   MCHC 32.7 10/12/2011 0322   RDW 16.4* 10/12/2011 0322   LYMPHSABS 0.8 10/03/2011 1500   MONOABS 1.1* 10/03/2011 1500   EOSABS 0.0 10/03/2011 1500   BASOSABS 0.0 10/03/2011 1500       Mr Brain Wo Contrast  10/10/2011  *RADIOLOGY REPORT*  Clinical Data: 78 year old female in the ICU with encephalopathy. Altered mental status.  Pneumonia.  MRI HEAD WITHOUT CONTRAST  Technique:  Multiplanar, multiecho pulse sequences of the brain and surrounding structures were obtained according to standard protocol without intravenous contrast.  Comparison: Head CTs 10/03/2011 and earlier.  Findings: No restricted diffusion to suggest acute infarction.  No midline shift, mass effect, evidence of mass lesion, ventriculomegaly, extra-axial collection or acute intracranial hemorrhage.  Cervicomedullary junction and pituitary are within normal limits.  Major intracranial vascular flow voids are preserved.  Mild for age periventricular white matter T2 and FLAIR hyperintensity.  Mild for age T2 heterogeneity in the deep gray matter nuclei  and brain stem.  Minimal T2 heterogeneity in the cerebellum. Probable incidental developmental venous anomaly in the right cerebellar hemisphere.  Negative visualized cervical spine. Normal bone marrow signal.  Postoperative changes to the globes.  Minimal paranasal sinus mucosal thickening.  Minimal fluid signal in the mastoids. Negative scalp soft tissues.  IMPRESSION: No acute intracranial abnormality.  Mild for age  nonspecific white and deep gray matter signal changes.  Original Report Authenticated By: Harley Hallmark, M.D.   Dg Chest Port 1 View  10/12/2011  *RADIOLOGY REPORT*  Clinical Data: Follow up of pneumonia.  PORTABLE CHEST - 1 VIEW  Comparison: 10/10/2011  Findings: Patient rotated to the left. Normal heart size.  Small layering left pleural effusion.  Interval extubation and removal of nasogastric tube. No pneumothorax.  Interstitial thickening is increased since prior exam.  Left base air space disease is similar.  IMPRESSION:  1.  Increased interstitial thickening, suspicious for progressive pulmonary venous congestion/mild interstitial edema. 2.  Layering small left pleural effusion with adjacent atelectasis or infection, unchanged. 3.  Extubation and removal of nasogastric tube.  Original Report Authenticated By: Consuello Bossier, M.D.    ASSESSMENT/PLAN:  Acute respiratory failure in setting of PNA with positive influenza A -Completed course of tamiflu -D2/7 levaquin -titrate oxygen to keep SpO2 > 92% -solumedrol started 1/10 for possible post-viral BOOP>>will decrease to 40 mg q12h -f/u CXR intermittently  AECOPD>>improved -continue solumedrol, scheduled bronchodilators  TME with hx of seizures>>mental status slowly improving.  Family reports pt is slow to recover from sedation after previous surgeries. -continue lamictal, zonegran when able to take oral medication  Hx of HTN -continue meds when able to take oral medications  Steroid induced hyperglycemia -SSI  Protein calorie malnutrition -start diet when more alert  Hypernatremia -change to 1/2 NS for IV fluid  Change to SDU status  Kaidence Sant Pager:  [phone] 10/12/2011, 9:20 AM

## 2011-10-12 NOTE — Plan of Care (Signed)
Problem: Phase II Progression Outcomes Goal: Time pt extubated/weaned off vent Outcome: Completed/Met Date Met:  10/11/11 Pt extubated herself at 1740, 10/11/11

## 2011-10-13 LAB — GLUCOSE, CAPILLARY
Glucose-Capillary: 132 mg/dL — ABNORMAL HIGH (ref 70–99)
Glucose-Capillary: 122 mg/dL — ABNORMAL HIGH (ref 70–99)

## 2011-10-13 LAB — BASIC METABOLIC PANEL
BUN: 24 mg/dL — ABNORMAL HIGH (ref 6–23)
Calcium: 8.9 mg/dL (ref 8.4–10.5)
Chloride: 104 mEq/L (ref 96–112)
Creatinine, Ser: 0.56 mg/dL (ref 0.50–1.10)
GFR calc Af Amer: >90 mL/min (ref >90)
GFR calc non Af Amer: 88 mL/min — ABNORMAL LOW (ref >90)

## 2011-10-13 LAB — CBC
MCHC: 32.4 g/dL (ref 30.0–36.0)
Platelets: 311 10*3/uL (ref 150–400)
RDW: 16.4 % — ABNORMAL HIGH (ref 11.5–15.5)
WBC: 12.4 10*3/uL — ABNORMAL HIGH (ref 4.0–10.5)

## 2011-10-13 NOTE — Progress Notes (Signed)
Pt resting. BP has decreased after a PRN hydralazine dose was given. Pt's left arm was swollen and pink on initial assessment. Swelling remains but has decreased after blood pressure cuff was switched to right arm at 20:00 and left arm was elevated with pillows. This nurse has checked the L radial pulse and cap refill Q2hr to monitor arm for any signs of blood clots or worsening perfusion.

## 2011-10-13 NOTE — Progress Notes (Signed)
Jessica [name] is a 78 y.o. female smoker, resident of independent living center admitted on 10/03/2011 with pneumonia, hypoxemia, change in mental status.  Dx 12/28 with positive Influenza A.  Developed VDRF 1/10.  Has hx of seizures.  Line/tube: ETT 1/10 >>>1/12 (self extubation)  Cultures: 12/28 Influenza PCR>>Flu A positive BC x 2 1/4 >>>negative MRSA Screen 1/10 >>Negative Urine legionella 1/11>>  Urine Strep 1/11 >>>negative  Antibiotics: Primaxin (?HCAP) 1/9 >>>1/11  Vanc (?HCAP) 1/9 >>>1/11 Levaquin 1/11>>  Tests/events: 1/4 CT head>>no acute findings 1/7 EEG>>no epileptiform acitivity 1/9 MRI brain>>no acute findings 1/11 CT chest>>no PE, Lt > Rt lower lobe ASD/ATX, extensive changes of emphysema, interstitial edema pattern, mild mediastinal/hilar adenopathy 1/11 ESR>>72, BNP>>1039  SUBJECTIVE: Mental status much improved.  Denies chest/abd pain, dyspnea.  Feels week, and has scratchy voice.  OBJECTIVE:  Blood pressure 142/67, pulse 86, temperature 97.8 F (36.6 C), temperature source Oral, resp. rate 22, height 5\' 4"  (1.626 m), weight 123 lb 14.4 oz (56.2 kg), SpO2 95.00%. Wt Readings from Last 3 Encounters:  10/13/11 123 lb 14.4 oz (56.2 kg)  09/26/11 138 lb 7.2 oz (62.8 kg)   Body mass index is 21.27 kg/(m^2).  I/O last 3 completed shifts: In: 1317.3 [I.V.:1087.3; Other:70; IV Piggyback:160] Out: 4700 [Urine:4700]     General - no distress HEENT - dry oral mucosa Cardiac - s1s2 regular, no murmur Chest - prolonged exhalation, scattered rhonchi, no wheeze Abd - soft, nontender, + bowel sounds GU - foley in place Ext - no edema Neuro - A&O x 3, follows commands, moves all extremities  BMET    Component Value Date/Time   NA 137 10/13/2011 0315   K 3.5 10/13/2011 0315   CL 104 10/13/2011 0315   CO2 26 10/13/2011 0315   GLUCOSE 100* 10/13/2011 0315   BUN 24* 10/13/2011 0315   CREATININE 0.56 10/13/2011 0315   CALCIUM 8.9 10/13/2011 0315   GFRNONAA 88*  10/13/2011 0315   GFRAA >90 10/13/2011 0315    CBC    Component Value Date/Time   WBC 12.4* 10/13/2011 0315   RBC 4.47 10/13/2011 0315   HGB 12.4 10/13/2011 0315   HCT 38.3 10/13/2011 0315   PLT 311 10/13/2011 0315   MCV 85.7 10/13/2011 0315   MCH 27.7 10/13/2011 0315   MCHC 32.4 10/13/2011 0315   RDW 16.4* 10/13/2011 0315   LYMPHSABS 0.8 10/03/2011 1500   MONOABS 1.1* 10/03/2011 1500   EOSABS 0.0 10/03/2011 1500   BASOSABS 0.0 10/03/2011 1500       Dg Chest Port 1 View  10/12/2011  *RADIOLOGY REPORT*  Clinical Data: Follow up of pneumonia.  PORTABLE CHEST - 1 VIEW  Comparison: 10/10/2011  Findings: Patient rotated to the left. Normal heart size.  Small layering left pleural effusion.  Interval extubation and removal of nasogastric tube. No pneumothorax.  Interstitial thickening is increased since prior exam.  Left base air space disease is similar.  IMPRESSION:  1.  Increased interstitial thickening, suspicious for progressive pulmonary venous congestion/mild interstitial edema. 2.  Layering small left pleural effusion with adjacent atelectasis or infection, unchanged. 3.  Extubation and removal of nasogastric tube.  Original Report Authenticated By: Consuello Bossier, M.D.    ASSESSMENT/PLAN:  Acute respiratory failure in setting of PNA with positive influenza A -Completed course of tamiflu -D3/7 levaquin -titrate oxygen to keep SpO2 > 92% -solumedrol started 1/10 for possible post-viral BOOP>>continue solumedrol 40 mg q12h, and transition to prednisone when able to swallow -f/u CXR intermittently  AECOPD>>improved -continue solumedrol, scheduled bronchodilators  TME with hx of seizures>>mental status much improved 1/14.  Family reports pt is slow to recover from sedation after previous surgeries. -continue lamictal, zonegran when able to take oral medication  Hx of HTN -continue meds when able to take oral medications  Steroid induced hyperglycemia -SSI  Dysphagia -will ask speech  therapy to assess  Protein calorie malnutrition -start diet after eval by speech  Hypernatremia>>improved -continue D5-1/2 NS IV fluid -f/u BMET  Debility -will ask physical therapy to reassess -OOB to chair  Disposition -keep foley in today -transfer to telemetry  Seraphim Trow Pager:  [phone] 10/13/2011, 10:32 AM

## 2011-10-13 NOTE — Progress Notes (Signed)
UR complete 

## 2011-10-13 NOTE — Progress Notes (Signed)
Pt lethargic but more alert than she has been throughout the night. She has been resting but has not been able to sleep. Pt just reported seeing Beethoven on the monitor in the room when the monitor was turned off. Will continue to monitor patient.

## 2011-10-13 NOTE — Progress Notes (Signed)
Speech Language/Pathology Clinical/Bedside Swallow Evaluation Patient Details  Name: Jessica [name] MRN: [id number] DOB: 03-12-1932 Today's Date: 10/13/2011 1315-1401 Past Medical History:  Past Medical History  Diagnosis Date  . Seizures   . GERD (gastroesophageal reflux disease)   . Peripheral vascular disease   . Anxiety   . Hyperlipidemia   . Hypertension   . Embolism - blood clot 1996    left breast  . Bone infection of left hand 12/22/2008    secondary to injury  . Hemorrhoids   . Cancer     colon cancer-hx of chemo  . Melanoma     metastatic   Past Surgical History:  Past Surgical History  Procedure Date  . C-sections 1957, 1959, 1961    x3  . Abdominal hysterectomy 1963  . Colon resection 1995  . Vocal cord injection 1996, 1997  . Breast biopsies     x2 on left, x 1 on right  . Hemorrhoid surgery   . Porta cath placement 1995  . Porta cath removal 1996  . Heart ablation 1997  . Bladder repair   . Left femoral artery stent 05/07/2004  . Melanoma removed from left leg 09/06/2004  . Melanoma removed from left groin 09/2004  . Skin graft to left leg 12/05/2004  . Orif left humerus fracture 03/27/2005  . Laser surgery to right eye 10/04/2007  . Removal of tumor in left knee 11/15/2010   HPI:  78 year old female admitted to [facility] 10/03/11 with pna, mental status change, hypoxemia.  Pt required intubation due to respiratory failure 10/09/11 with self-extubation on 10/11/11.  Pt had a BSE 10/06/11 without overt clinical s/s of aspiration and pt acknowledged h/o infrequent choking on intake requiring her to expectorate secretions.  Pt CT chest 10/10/11 showed no PE, left greater than right airspace disease/atx-emphysema.  Pt had previous surgery on vocal folds 1990 for nodule with injections 1995, 1997. Pt's daughter present and reports pt had a previous swallow evaluation  but not in [facility] system and pt nor daughter recalled results.     Assessment/Recommendations/Treatment  Plan Suspected Esophageal Findings Suspected Esophageal Findings: Other (comment) (known h/o GERD)  SLP Assessment Clinical Impression Statement: Pt currently is a high aspiration risk with any po secondary to gross weakness and current level of dysphagia.  Pt's  is able to verbalize single word with decr phonatory/respiratory support and poor intelligibilty.  Pt only given tsps of thin/nectar and ice chip, delayed swallow initiation and oral stasis (right sided, ? cognitive related) with weak nonprotective cough after the swallow.  Pt's daughter present and reports pt takes aporox 3 times longer to recover from illnesses than other people.  Rec pt continue npo currently except medications she must have with SMALL amount of applesauce only.  Concern for this pt's swallow prognosis in acute setting present given current level of compromised airway protection, recent recurrent illnesses, COPD and level of dysphagia .  Rec pt have an MBS to instrumentally evaluate swallow when pt's swallow function/medical status have improved.  Daughter and pt educated and agreeable.   Risk for Aspiration: Severe Other Related Risk Factors: History of pneumonia;History of dysphagia;History of GERD;Cognitive impairment;Lethargy;Decreased management of secretions;History of esophageal-related issues;Decreased respiratory status  Swallow Recommendations General Recommendation: NPO Solid Consistency: Regular (avoid particulate foods and mixed consistencies) Liquid Consistency: Thin Supervision: Patient able to self feed Compensations: Slow rate;Small sips/bites (take rest breaks if short of breathing or coughing with po) Postural Changes and/or Swallow Maneuvers: Seated upright 90 degrees;Upright  30-60 min after meal Oral Care Recommendations: Oral care QID  Treatment Plan Treatment Plan Recommendations: No treatment recommended at this time Speech Therapy Frequency: min 2x/week Treatment Duration: 2  weeks Interventions: Patient/family education  Prognosis Prognosis for Safe Diet Advancement: Guarded Barriers to Reach Goals: Cognitive deficits;Severity of dysphagia Barriers/Prognosis Comment: respiratory deficit  Individuals Consulted Consulted and Agree with Results and Recommendations: Patient;Family member/caregiver Family Member Consulted: daugher Web designer Goals  SLP Swallowing Goals Goal #3: Pt will accept po boluses of nectar consistency with swallow initiation within 5 seconds and no s/s of aspiration to aid in determining appropriateness for MBS or dietary advancement.    Swallow Study Prior Functional Status    General  HPI: 78 year old female admitted to [facility] 10/03/11 with pna, mental status change, hypoxemia.  Pt required intubation due to respiratory failure 10/09/11 with self-extubation on 10/11/11.  Pt had a BSE 10/06/11 without overt clinical s/s of aspiration and pt acknowledged h/o infrequent choking on intake requiring her to expectorate secretions.  Pt CT chest 10/10/11 showed no PE, left greater than right airspace disease/atx-emphysema.  Pt had previous surgery on vocal folds 1990 for nodule with injections 1995, 1997. Pt's daughter present and reports pt had a previous swallow evaluation  but not in [facility] system and pt nor daughter recalled results.   Type of Study: Bedside swallow evaluation Diet Prior to this Study: NPO Respiratory Status: Supplemental O2 delivered via (comment) (2 liters) Behavior/Cognition: Lethargic Oral Cavity - Dentition: Adequate natural dentition Patient Positioning: Upright in bed Baseline Vocal Quality: Aphonic;Breathy;Low vocal intensity;Hoarse;Other (comment) (able to say single words only) Volitional Cough: Weak;Wet;Congested  Oral Motor/Sensory Function  Overall Oral Motor/Sensory Function: Other (comment) (gross weakness)  Consistency Results  Ice Chips Ice chips: Impaired Oral Phase Impairments: Reduced labial  seal;Reduced lingual movement/coordination;Poor awareness of bolus;Impaired anterior to posterior transit Oral Phase Functional Implications: Right lateral sulci pocketing;Oral holding;Oral residue Pharyngeal Phase Impairments: Delayed Swallow;Cough - Delayed Other Comments: delayed swallow up to 8 seconds with overt cue  Thin Liquid Thin Liquid: Impaired Presentation: Spoon Oral Phase Impairments: Reduced lingual movement/coordination;Impaired anterior to posterior transit;Poor awareness of bolus;Reduced labial seal Oral Phase Functional Implications: Right anterior spillage;Oral residue;Oral holding Pharyngeal  Phase Impairments: Delayed Swallow;Cough - Delayed Other Comments: cue to swallow, delayed up to 9 seconds increasing asp risk  Nectar Thick Liquid Nectar Thick Liquid: Impaired Presentation: Spoon Oral Phase Impairments: Reduced labial seal;Reduced lingual movement/coordination;Impaired anterior to posterior transit;Poor awareness of bolus Oral phase functional implications: Right anterior spillage Pharyngeal Phase Impairments: Delayed Swallow;Throat Clearing - Delayed  Honey Thick Liquid Honey Thick Liquid: Not tested  Puree Puree: Not tested  Solid Solid: Not tested  Donavan Burnet, MS [facility] SLP [phone]  Chales Abrahams 10/13/2011,2:12 PM

## 2011-10-13 NOTE — Progress Notes (Signed)
Heat pack applied to left arm. Generalized swelling has decreased, making swelling at the Left [facility] more noticeable. Swelling may be due to possible IV infiltration? Will pass on to day shift to have MD look at site.

## 2011-10-13 NOTE — Progress Notes (Signed)
CSW reviewed chart. Pt improved, off vent. Plan prior to ICU was for SNF where Pt is a resident at [facility]. CSW will await PT eval and update FL2 accordingly for d/c to SNF when able.  Vennie Homans, [place] 10/13/2011 11:37 AM [phone]

## 2011-10-13 NOTE — Progress Notes (Signed)
Dr. Marchelle Gearing notified that pt is hallucinating but refuses medications at this time. Will continue to monitor pt and contact MD if needed.

## 2011-10-13 NOTE — Progress Notes (Signed)
Pt remains lethargic but communicates with abbreviated verbal responses or head nods.

## 2011-10-13 NOTE — Progress Notes (Signed)
PT NOTE  Pt was D/C'd from PT services.  Please re-order if you would like PT eval.  Thank you.

## 2011-10-14 DIAGNOSIS — R4182 Altered mental status, unspecified: Secondary | ICD-10-CM

## 2011-10-14 DIAGNOSIS — J9600 Acute respiratory failure, unspecified whether with hypoxia or hypercapnia: Secondary | ICD-10-CM

## 2011-10-14 DIAGNOSIS — J189 Pneumonia, unspecified organism: Secondary | ICD-10-CM

## 2011-10-14 DIAGNOSIS — J441 Chronic obstructive pulmonary disease with (acute) exacerbation: Secondary | ICD-10-CM

## 2011-10-14 LAB — BASIC METABOLIC PANEL
BUN: 25 mg/dL — ABNORMAL HIGH (ref 6–23)
CO2: 28 mEq/L (ref 19–32)
Chloride: 102 mEq/L (ref 96–112)
GFR calc Af Amer: >90 mL/min (ref >90)
Potassium: 3.9 mEq/L (ref 3.5–5.1)

## 2011-10-14 LAB — GLUCOSE, CAPILLARY
Glucose-Capillary: 120 mg/dL — ABNORMAL HIGH (ref 70–99)
Glucose-Capillary: 100 mg/dL — ABNORMAL HIGH (ref 70–99)
Glucose-Capillary: 151 mg/dL — ABNORMAL HIGH (ref 70–99)

## 2011-10-14 MED ORDER — PANTOPRAZOLE SODIUM 40 MG PO TBEC
40.0000 mg | DELAYED_RELEASE_TABLET | Freq: Every day | ORAL | Status: DC
  Administered 2011-10-15 – 2011-10-20 (×6): 40 mg via ORAL
  Filled 2011-10-14 (×7): qty 1

## 2011-10-14 MED ORDER — LORAZEPAM 2 MG/ML IJ SOLN
2.0000 mg | INTRAMUSCULAR | Status: DC | PRN
  Administered 2011-10-14: 2 mg via INTRAVENOUS
  Filled 2011-10-14: qty 1

## 2011-10-14 MED ORDER — INSULIN ASPART 100 UNIT/ML ~~LOC~~ SOLN: 0.0000 [IU] | Freq: Every day | SUBCUTANEOUS | Status: DC

## 2011-10-14 MED ORDER — PREDNISONE 20 MG PO TABS
20.0000 mg | ORAL_TABLET | Freq: Every day | ORAL | Status: DC
  Administered 2011-10-14 – 2011-10-20 (×6): 20 mg via ORAL
  Filled 2011-10-14 (×8): qty 1

## 2011-10-14 MED ORDER — SODIUM CHLORIDE 0.9 % IV SOLN
1000.0000 mg | Freq: Once | INTRAVENOUS | Status: AC
  Administered 2011-10-14: 1000 mg via INTRAVENOUS
  Filled 2011-10-14: qty 10

## 2011-10-14 MED ORDER — LAMOTRIGINE 150 MG PO TABS
150.0000 mg | ORAL_TABLET | Freq: Every day | ORAL | Status: DC
  Filled 2011-10-14: qty 1

## 2011-10-14 MED ORDER — ZONISAMIDE 100 MG PO CAPS
100.0000 mg | ORAL_CAPSULE | Freq: Every day | ORAL | Status: DC
  Administered 2011-10-14 – 2011-10-20 (×7): 100 mg via ORAL
  Filled 2011-10-14 (×7): qty 1

## 2011-10-14 MED ORDER — LAMOTRIGINE 150 MG PO TABS: 150.0000 mg | ORAL_TABLET | Freq: Every day | ORAL | Status: DC

## 2011-10-14 MED ORDER — INSULIN ASPART 100 UNIT/ML ~~LOC~~ SOLN
0.0000 [IU] | Freq: Three times a day (TID) | SUBCUTANEOUS | Status: DC
  Administered 2011-10-16 (×2): 3 [IU] via SUBCUTANEOUS
  Administered 2011-10-17: 7 [IU] via SUBCUTANEOUS
  Administered 2011-10-18: 3 [IU] via SUBCUTANEOUS
  Administered 2011-10-19: 4 [IU] via SUBCUTANEOUS
  Filled 2011-10-14: qty 3

## 2011-10-14 MED ORDER — METOPROLOL TARTRATE 50 MG PO TABS
50.0000 mg | ORAL_TABLET | Freq: Two times a day (BID) | ORAL | Status: DC
  Administered 2011-10-14 – 2011-10-18 (×6): 50 mg via ORAL
  Filled 2011-10-14 (×12): qty 1

## 2011-10-14 NOTE — Progress Notes (Signed)
Speech Language/Pathology Speech Pathology: Dysphagia Treatment Note [phone]  Patient was observed with : jello, icecream, cracker and Thin liquids.  RN reports pt tolerated po medicine today with water.  Voice remains weak but with improved strength compared to previous date.  Pt agitated today when answering questions, requesting red jello.    Patient was noted to have s/s of aspiration : Yes:  Weak cough observed with and without po intake, only occurred 4 times of approximately 20 swallows, Weak cough will compromise airway protection if pt aspirates.  Pt acknowledges she "self extubated before", ? If h/o intubation.    Lung Sounds: clear Temperature: afebrile  Patient required: no verbal cues required as pt takes small bites/sips independently   Clinical Impression: Much improved swallow function and phonatory ability today compared to previous date.  Pt reports productive cough.   Pt reports ability to swallow to be baseline.   Pt also reports her physician requested she does not consume ice, mints, tomato based foods due to esophageal issues/GERD.    Recommendations:  Options:  Po diet of soft/thin with ACCEPTED aspiration risk                    Or pursue MBS to instrumentally evaluate swallow ability given h/o of known dysphagia *pt and daughter unaware of previous swallow evaluation results that were completed at [facility] approximately 10 years ago.  Risk of  aspiration will be chronic in this SLPs opinion REGARDLESS OF MBSS findings due to pt's COPD, advanced age, deconditioning with weak cough, and h/o known vocal fold procedures.   Defer decision regarding diet or MBS to [facility] MD.  Pain:   none Intervention Required:   no  Goals: Goals Partially Met  Donavan Burnet, MS [facility] SLP [phone]

## 2011-10-14 NOTE — Plan of Care (Signed)
Problem: Phase I Progression Outcomes Goal: OOB as tolerated unless otherwise ordered Outcome: Progressing PT consult ordered    Goal: Initial discharge plan identified Outcome: Progressing Pt to be transferred to Telemetry floor, waiting for open bed. Goal: pt to return to [facility]

## 2011-10-14 NOTE — Progress Notes (Signed)
eLink Physician-Brief Progress Note Patient Name: ANIYA JOLICOEUR DOB: 03/30/1932 MRN: [id number]  Date of Service  10/14/2011   HPI/Events of Note   Breakthrough seizures x 3 on lamictal - ativan given  eICU Interventions  Transfer to ICU, neuro input   Intervention Category Major Interventions: Seizures - evaluation and management  ALVA,RAKESH V. 10/14/2011, 6:40 PM

## 2011-10-14 NOTE — Progress Notes (Signed)
Pt stable and transferred to room 1419 on telemetry. Sahkayla, RN received patient.

## 2011-10-14 NOTE — Progress Notes (Signed)
Jessica [name] is a 78 y.o. female smoker, resident of independent living center admitted on 10/03/2011 with pneumonia, hypoxemia, change in mental status.  Dx 12/28 with positive Influenza A.  Developed VDRF 1/10.  Has hx of seizures.  Line/tube: ETT 1/10 >>>1/12 (self extubation)  Cultures: 12/28 Influenza PCR>>Flu A positive BC x 2 1/4 >>>negative MRSA Screen 1/10 >>Negative Urine legionella 1/11>>  Urine Strep 1/11 >>>negative  Antibiotics: Primaxin (?HCAP) 1/9 >>>1/11  Vanc (?HCAP) 1/9 >>>1/11 Levaquin 1/11>>  Tests/events: 1/4 CT head>>no acute findings 1/7 EEG>>no epileptiform acitivity 1/9 MRI brain>>no acute findings 1/11 CT chest>>no PE, Lt > Rt lower lobe ASD/ATX, extensive changes of emphysema, interstitial edema pattern, mild mediastinal/hilar adenopathy 1/11 ESR>>72, BNP>>1039 1/15 Generalized tonic/clonic seizure  SUBJECTIVE: Had tonic/clonic seizure witnessed by family this AM.  Lasted 2 to 3 minutes.  OBJECTIVE:  Blood pressure 178/67, pulse 86, temperature 98.4 F (36.9 C), temperature source Oral, resp. rate 20, height 5\' 4"  (1.626 m), weight 121 lb 4.1 oz (55 kg), SpO2 93.00%. Wt Readings from Last 3 Encounters:  10/14/11 121 lb 4.1 oz (55 kg)  09/26/11 138 lb 7.2 oz (62.8 kg)   Body mass index is 20.81 kg/(m^2).  I/O last 3 completed shifts: In: 1270 [I.V.:1120; IV Piggyback:150] Out: 2340 [Urine:2340]     General - no distress HEENT - dry oral mucosa Cardiac - s1s2 regular, tachycardic, no murmur Chest - prolonged exhalation, scattered rhonchi, no wheeze Abd - soft, nontender, + bowel sounds Ext - no edema Neuro - Sluggish, follows commands, moves all extremities  BMET    Component Value Date/Time   NA 138 10/14/2011 0507   K 3.9 10/14/2011 0507   CL 102 10/14/2011 0507   CO2 28 10/14/2011 0507   GLUCOSE 119* 10/14/2011 0507   BUN 25* 10/14/2011 0507   CREATININE 0.65 10/14/2011 0507   CALCIUM 9.1 10/14/2011 0507   GFRNONAA 83*  10/14/2011 0507   GFRAA >90 10/14/2011 0507    CBC    Component Value Date/Time   WBC 12.4* 10/13/2011 0315   RBC 4.47 10/13/2011 0315   HGB 12.4 10/13/2011 0315   HCT 38.3 10/13/2011 0315   PLT 311 10/13/2011 0315   MCV 85.7 10/13/2011 0315   MCH 27.7 10/13/2011 0315   MCHC 32.4 10/13/2011 0315   RDW 16.4* 10/13/2011 0315   LYMPHSABS 0.8 10/03/2011 1500   MONOABS 1.1* 10/03/2011 1500   EOSABS 0.0 10/03/2011 1500   BASOSABS 0.0 10/03/2011 1500       No results found.  ASSESSMENT/PLAN:  Acute respiratory failure in setting of PNA with positive influenza A -Completed course of tamiflu -D4/7 levaquin -titrate oxygen to keep SpO2 > 92% -solumedrol started 1/10 for possible post-viral BOOP>>transition to prednisone -f/u CXR intermittently  AECOPD>>improved -continue prednisone, scheduled bronchodilators  TME with hx of seizures>>mental status much improved 1/14.  Family reports pt is slow to recover from sedation after previous surgeries. -seizure episode 1/15>>resume oral lamictal, zonegran and monitor  Hx of HTN -resume oral medications  Steroid induced hyperglycemia -SSI  Dysphagia -resume diet soft with thin liquids -defer MBSS for now  Protein calorie malnutrition -start diet  Hypernatremia>>improved -continue D5-1/2 NS IV fluid, but decrease rate -f/u BMET  Debility -will ask physical therapy to reassess -OOB to chair  Disposition -telemetry -PT to evaluate  Updated family at bedside about plan.  Leah Thornberry Pager:  [phone] 10/14/2011, 2:00 PM

## 2011-10-14 NOTE — Progress Notes (Signed)
Patient alert, oriented, and much more verbal than the previous night. No longer lethargic. Pt failed swallow eval today and remains NPO.

## 2011-10-14 NOTE — Progress Notes (Signed)
Attempted to call Geralynn Rile POA at 1940 to update about transfer to [place]. No answer, left message to call hospital back. Renleigh Ouellet A CN

## 2011-10-14 NOTE — Progress Notes (Signed)
10/14/11 1522 Monitor tech notified RN that patient's HR was tachycardic.HR was 150s-160s. RN witnessed seizure.The tonic clonic seizure was similar to that witnessed by family earlier today. Post seizure the patient was very lethargic and disoriented. The vital signs were: 98.46F,HR 128,RR 20, B/P 149/ 82. Will continue to monitor patient.

## 2011-10-14 NOTE — Progress Notes (Signed)
10/14/11 1752 Patient had another seizure. Ativan IV was administered. Vitals were 98.23F 118,20, 117/71 87-91% on 4 L  [place]. MD was notified. Awaiting new orders.

## 2011-10-15 ENCOUNTER — Inpatient Hospital Stay (HOSPITAL_COMMUNITY)
Admit: 2011-10-15 | Discharge: 2011-10-15 | Disposition: A | Discharge status: Home / Self Care | Payer: Medicare Other | Attending: Pulmonary Disease | Admitting: Pulmonary Disease

## 2011-10-15 DIAGNOSIS — G40309 Generalized idiopathic epilepsy and epileptic syndromes, not intractable, without status epilepticus: Secondary | ICD-10-CM

## 2011-10-15 DIAGNOSIS — J441 Chronic obstructive pulmonary disease with (acute) exacerbation: Secondary | ICD-10-CM

## 2011-10-15 DIAGNOSIS — J189 Pneumonia, unspecified organism: Secondary | ICD-10-CM

## 2011-10-15 DIAGNOSIS — R4182 Altered mental status, unspecified: Secondary | ICD-10-CM

## 2011-10-15 LAB — GLUCOSE, CAPILLARY
Glucose-Capillary: 87 mg/dL (ref 70–99)
Glucose-Capillary: 107 mg/dL — ABNORMAL HIGH (ref 70–99)
Glucose-Capillary: 114 mg/dL — ABNORMAL HIGH (ref 70–99)
Glucose-Capillary: 106 mg/dL — ABNORMAL HIGH (ref 70–99)

## 2011-10-15 MED ORDER — LAMOTRIGINE 150 MG PO TABS
150.0000 mg | ORAL_TABLET | Freq: Every day | ORAL | Status: DC
  Administered 2011-10-15 – 2011-10-20 (×6): 150 mg via ORAL
  Filled 2011-10-15 (×6): qty 1

## 2011-10-15 MED ORDER — SODIUM CHLORIDE 0.9 % IV SOLN
500.0000 mg | Freq: Two times a day (BID) | INTRAVENOUS | Status: DC
  Filled 2011-10-15: qty 5

## 2011-10-15 MED ORDER — SODIUM CHLORIDE 0.9 % IV SOLN
1000.0000 mg | Freq: Two times a day (BID) | INTRAVENOUS | Status: DC
  Administered 2011-10-15 – 2011-10-17 (×5): 1000 mg via INTRAVENOUS
  Filled 2011-10-15 (×6): qty 10

## 2011-10-15 MED ORDER — LAMOTRIGINE 150 MG PO TABS
150.0000 mg | ORAL_TABLET | Freq: Every day | ORAL | Status: DC
  Filled 2011-10-15: qty 1

## 2011-10-15 MED ORDER — ZONISAMIDE 100 MG PO CAPS: 100.0000 mg | ORAL_CAPSULE | Freq: Every day | ORAL | Status: DC

## 2011-10-15 MED ORDER — ENSURE PUDDING PO PUDG
1.0000 | Freq: Two times a day (BID) | ORAL | Status: DC
  Administered 2011-10-15 – 2011-10-20 (×5): 1 via ORAL
  Filled 2011-10-15 (×11): qty 1

## 2011-10-15 MED ORDER — LAMOTRIGINE 25 MG PO TABS
225.0000 mg | ORAL_TABLET | Freq: Every day | ORAL | Status: DC
  Administered 2011-10-15: 225 mg via ORAL
  Filled 2011-10-15 (×2): qty 1

## 2011-10-15 MED ORDER — SODIUM CHLORIDE 0.9 % IV SOLN
50.0000 mg | Freq: Two times a day (BID) | INTRAVENOUS | Status: DC
  Filled 2011-10-15: qty 5

## 2011-10-15 MED ORDER — LAMOTRIGINE 200 MG PO TABS: 200.0000 mg | ORAL_TABLET | Freq: Every day | ORAL | Status: DC

## 2011-10-15 NOTE — Progress Notes (Signed)
Nutrition Follow-up  Diet Order:  Dys 3, thin  Pt reports hunger. Asking for food.  RN assisting with small snack- applesauce and Ensure- during medication administration.  Meds: Scheduled Meds:   . albuterol  2.5 mg Nebulization Q6H  . antiseptic oral rinse  15 mL Mouth Rinse QID  . chlorhexidine  15 mL Mouth Rinse BID  . heparin subcutaneous  5,000 Units Subcutaneous Q8H  . insulin aspart  0-20 Units Subcutaneous TID WC  . insulin aspart  0-5 Units Subcutaneous QHS  . lamoTRIgine  150 mg Oral Daily  . lamoTRIgine  225 mg Oral QHS  . levetiracetam  1,000 mg Intravenous Once  . levetiracetam  1,000 mg Intravenous Q12H  . metoprolol tartrate  50 mg Oral BID  . olmesartan  20 mg Oral Daily  . olopatadine  1 drop Both Eyes Daily  . pantoprazole  40 mg Oral Q1200  . polyethylene glycol  17 g Oral Daily  . predniSONE  20 mg Oral Q breakfast  . zonisamide  100 mg Oral Daily  . DISCONTD: insulin aspart  0-9 Units Subcutaneous Q4H  . DISCONTD: lacosamide (VIMPAT) IV  50 mg Intravenous Q12H  . DISCONTD: lamoTRIgine  150 mg Oral Daily  . DISCONTD: lamoTRIgine  150 mg Oral Daily  . DISCONTD: lamoTRIgine  150 mg Oral Daily  . DISCONTD: lamoTRIgine  150 mg Per Tube Daily  . DISCONTD: lamoTRIgine  200 mg Oral QPC supper  . DISCONTD: lamoTRIgine  200 mg Per Tube QPC supper  . DISCONTD: levetiracetam  500 mg Intravenous Q12H  . DISCONTD: levofloxacin (LEVAQUIN) IV  750 mg Intravenous Q24H  . DISCONTD: methylPREDNISolone (SOLU-MEDROL) injection  40 mg Intravenous Q12H  . DISCONTD: pantoprazole (PROTONIX) IV  40 mg Intravenous Q24H  . DISCONTD: zonisamide  100 mg Oral Daily  . DISCONTD: zonisamide  100 mg Oral Daily   Continuous Infusions:   . dextrose 5 % and 0.45 % NaCl with KCl 20 mEq/L 30 mL/hr at 10/15/11 0600   PRN Meds:.acetaminophen (TYLENOL) oral liquid 160 mg/5 mL, albuterol, hydrALAZINE, LORazepam, ondansetron (ZOFRAN) IV  Labs:  CMP     Component Value Date/Time   NA 138  10/14/2011 0507   K 3.9 10/14/2011 0507   CL 102 10/14/2011 0507   CO2 28 10/14/2011 0507   GLUCOSE 119* 10/14/2011 0507   BUN 25* 10/14/2011 0507   CREATININE 0.65 10/14/2011 0507   CALCIUM 9.1 10/14/2011 0507   PROT 5.9* 10/09/2011 0427   ALBUMIN 2.2* 10/09/2011 0427   AST 23 10/09/2011 0427   ALT 31 10/09/2011 0427   ALKPHOS 81 10/09/2011 0427   BILITOT 0.4 10/09/2011 0427   GFRNONAA 83* 10/14/2011 0507   GFRAA >90 10/14/2011 0507     Intake/Output Summary (Last 24 hours) at 10/15/11 1023 Last data filed at 10/15/11 0900  Gross per 24 hour  Intake    890 ml  Output   1940 ml  Net  -1050 ml    Weight Status:   Admission wt: 129 lbs Current wt: 121 lbs  Nutrition Dx:  Inadequate oral intake, ongoing  Intervention:   1.  General diet; pt at risk for chronic aspiration and is eating with accepted risk, however remains full code.  2.  Enteral nutrition; noted MD consideration for PEG placement for medication administration.  Pt would likely benefit nutritionally from PEG placement as coughing noted when RN administered snack this am.  **Per RN, pt has had a slight cough since admission due to  pneumonia.  Pt took oral medications well with Ensure.  Quickly falls asleep afterwards.   3.  Supplements; Ensure pudding with meds. Pt does not like applesauce.  Monitor:   1.  Food/Beverage; pt to receive trays, supplements. 2.  GOC; r/t PEG placement.  Pt noted to have AMS due to seizures with questionable medication compliance.   Hoyt Koch Pager #:  [phone]

## 2011-10-15 NOTE — Progress Notes (Signed)
CSW following for SNF. Pt from [facility] in ALF and plan has been to go to their SNF. CSW called rep at [facility] and updated them on her condition. They feel they should have a bed ready for Pt when she is ready for discharge. CSW will follow for SNF and to support Pt and family.  Vennie Homans, [place] 10/15/2011 11:40 AM [phone]

## 2011-10-15 NOTE — Progress Notes (Signed)
Jessica [name] is a 78 y.o. female smoker, resident of independent living center admitted on 10/03/2011 with pneumonia, hypoxemia, change in mental status.  Dx 12/28 with positive Influenza A.  Developed VDRF 1/10.  Has hx of seizures.  Line/tube: ETT 1/10 >>>1/12 (self extubation)  Cultures: 12/28 Influenza PCR>>Flu A positive BC x 2 1/4 >>>negative MRSA Screen 1/10 >>Negative Urine legionella 1/11>>  Urine Strep 1/11 >>>negative  Antibiotics: Primaxin (?HCAP) 1/9 >>>1/11  Vanc (?HCAP) 1/9 >>>1/11 Levaquin 1/11>>16  Tests/events: 1/4 CT head>>no acute findings 1/7 EEG>>no epileptiform acitivity 1/9 MRI brain>>no acute findings 1/11 CT chest>>no PE, Lt > Rt lower lobe ASD/ATX, extensive changes of emphysema, interstitial edema pattern, mild mediastinal/hilar adenopathy 1/11 ESR>>72, BNP>>1039 1/15 Generalized tonic/clonic seizure>>transfer back to ICU  SUBJECTIVE: Had two additional seizures yesterday, and transfer to ICU.  Received ativan x 1, and IV keppra x 1.  No further seizure activity.  OBJECTIVE:  Blood pressure 92/41, pulse 67, temperature 98.4 F (36.9 C), temperature source Oral, resp. rate 21, height 5\' 3"  (1.6 m), weight 118 lb 6.2 oz (53.7 kg), SpO2 94.00%. Wt Readings from Last 3 Encounters:  10/14/11 118 lb 6.2 oz (53.7 kg)  09/26/11 138 lb 7.2 oz (62.8 kg)   Body mass index is 20.97 kg/(m^2).  I/O last 3 completed shifts: In: 1402.7 [I.V.:1252.7; IV Piggyback:150] Out: 2590 [Urine:1840; Stool:750]  Vent Mode:  [-]  FiO2 (%):  [50 %] 50 %  General - no distress HEENT - dry oral mucosa Cardiac - s1s2 regular, tachycardic, no murmur Chest - prolonged exhalation, scattered rhonchi, no wheeze Abd - soft, nontender, + bowel sounds Ext - no edema Neuro - Somnolent, arouses with stimulation, open's eyes with stimulation, moves extremities  BMET    Component Value Date/Time   NA 138 10/14/2011 0507   K 3.9 10/14/2011 0507   CL 102 10/14/2011 0507   CO2 28 10/14/2011 0507   GLUCOSE 119* 10/14/2011 0507   BUN 25* 10/14/2011 0507   CREATININE 0.65 10/14/2011 0507   CALCIUM 9.1 10/14/2011 0507   GFRNONAA 83* 10/14/2011 0507   GFRAA >90 10/14/2011 0507    CBC    Component Value Date/Time   WBC 12.4* 10/13/2011 0315   RBC 4.47 10/13/2011 0315   HGB 12.4 10/13/2011 0315   HCT 38.3 10/13/2011 0315   PLT 311 10/13/2011 0315   MCV 85.7 10/13/2011 0315   MCH 27.7 10/13/2011 0315   MCHC 32.4 10/13/2011 0315   RDW 16.4* 10/13/2011 0315   LYMPHSABS 0.8 10/03/2011 1500   MONOABS 1.1* 10/03/2011 1500   EOSABS 0.0 10/03/2011 1500   BASOSABS 0.0 10/03/2011 1500    ASSESSMENT/PLAN:  Acute respiratory failure in setting of PNA with positive influenza A -Completed course of tamiflu -Has been on Abx for 7 days>>will d/c levaquin -titrate oxygen to keep SpO2 > 92% -solumedrol started 1/10 for possible post-viral BOOP>>transitioned to prednisone -f/u CXR intermittently  AECOPD>>improved -continue prednisone, scheduled bronchodilators  TME with hx of seizures>>mental status much improved 1/14.  Family reports pt is slow to recover from sedation after previous surgeries. -seizure episode 1/15 -continue IV keppra -d/c levaquin with concern for lowering seizure threshold -will ask for neurology to assess -continue oral lamictal, zonegran if able to swallow  Hx of HTN -continue oral medications when able to swallow  Steroid induced hyperglycemia -SSI  Dysphagia/protein calorie malnutrition -mechanical soft with thin liquids -defer MBSS for now  Hypernatremia>>improved -continue D5-1/2 NS IV fluid, but decrease rate -f/u BMET  Debility -physical  therapy to reassess -OOB to chair  Darrelle Barrell Pager:  [phone] 10/15/2011, 7:33 AM

## 2011-10-15 NOTE — Progress Notes (Signed)
Notified Elink MD of pt lethargic status. MD suggested prolonged postictal phase and instructed to continue watching.  Langley Gauss, RN

## 2011-10-15 NOTE — Consult Note (Signed)
Reason for Consult: "breakthrough seizure"  HPI: Jessica [name] is an 78 y.o. female. Who has a history of COPD, pneumonia, epilepsy on Lamictal and Zonegran previously who came in with confusion, lethargy and had 3 seizures since arrival. It is believed that she may not have been able to take her meds due to her mental status. Evaluated by speech and swallow and deemed to be a chronic aspiration risk.   Past Medical History  Diagnosis Date  . Seizures   . GERD (gastroesophageal reflux disease)   . Peripheral vascular disease   . Anxiety   . Hyperlipidemia   . Hypertension   . Embolism - blood clot 1996    left breast  . Bone infection of left hand 12/22/2008    secondary to injury  . Hemorrhoids   . Cancer     colon cancer-hx of chemo  . Melanoma     metastatic   Medications: I have reviewed the patient's current medications.  Past Surgical History  Procedure Date  . C-sections 1957, 1959, 1961    x3  . Abdominal hysterectomy 1963  . Colon resection 1995  . Vocal cord injection 1996, 1997  . Breast biopsies     x2 on left, x 1 on right  . Hemorrhoid surgery   . Porta cath placement 1995  . Porta cath removal 1996  . Heart ablation 1997  . Bladder repair   . Left femoral artery stent 05/07/2004  . Melanoma removed from left leg 09/06/2004  . Melanoma removed from left groin 09/2004  . Skin graft to left leg 12/05/2004  . Orif left humerus fracture 03/27/2005  . Laser surgery to right eye 10/04/2007  . Removal of tumor in left knee 11/15/2010   History reviewed. No pertinent family history.  Social History:  reports that she has been smoking.  She has never used smokeless tobacco. She reports that she does not drink alcohol or use illicit drugs.  Allergies:  Allergies  Allergen Reactions  . Aspirin   . Venlafaxine Other (See Comments)    seizures  . Penicillins Rash  . Zithromax (Azithromycin) Rash   ROS: as above  Blood pressure 110/42, pulse 66, temperature  98.2 F (36.8 C), temperature source Oral, resp. rate 20, height 5\' 3"  (1.6 m), weight 53.7 kg (118 lb 6.2 oz), SpO2 94.00%.  Neurological exam: AAO*3. No aphasia. Followed complex commands. Mildly inattentive and slow to respond. Cranial nerves: EOMI, PERRL. Visual fields were full. Sensation to V1 through V3 areas of the face was intact and symmetric throughout. There was no facial asymmetry. Hearing to finger rub was equal and symmetrical bilaterally. Shoulder shrug was 5/5 and symmetric bilaterally. Head rotation was 5/5 bilaterally. There was no dysarthria or palatal deviation. Motor: strength was 5/5 and symmetric throughout. Sensory: was intact throughout to light touch, pinprick. Coordination: finger-to-nose were intact and symmetric bilaterally. Reflexes: were 2+ in upper extremities and 1+ at the knees and 1+ at the ankles. Plantar response was downgoing bilaterally. Gait: deferred  Results for orders placed during the hospital encounter of 10/03/11 (from the past 48 hour(s))  GLUCOSE, CAPILLARY     Status: Abnormal   Collection Time   10/13/11 11:38 AM      Component Value Range Comment   Glucose-Capillary 122 (*) 70 - 99 (mg/dL)    Comment 1 Notify RN      Comment 2 Documented in Chart     GLUCOSE, CAPILLARY     Status: Normal  Collection Time   10/13/11  3:34 PM      Component Value Range Comment   Glucose-Capillary 93  70 - 99 (mg/dL)    Comment 1 Documented in Chart      Comment 2 Notify RN     GLUCOSE, CAPILLARY     Status: Abnormal   Collection Time   10/13/11  7:25 PM      Component Value Range Comment   Glucose-Capillary 124 (*) 70 - 99 (mg/dL)    Comment 1 Notify RN     GLUCOSE, CAPILLARY     Status: Abnormal   Collection Time   10/13/11 11:14 PM      Component Value Range Comment   Glucose-Capillary 120 (*) 70 - 99 (mg/dL)    Comment 1 Notify RN     GLUCOSE, CAPILLARY     Status: Abnormal   Collection Time   10/14/11  4:42 AM      Component Value Range Comment    Glucose-Capillary 100 (*) 70 - 99 (mg/dL)    Comment 1 Documented in Chart      Comment 2 Notify RN     BASIC METABOLIC PANEL     Status: Abnormal   Collection Time   10/14/11  5:07 AM      Component Value Range Comment   Sodium 138  135 - 145 (mEq/L)    Potassium 3.9  3.5 - 5.1 (mEq/L)    Chloride 102  96 - 112 (mEq/L)    CO2 28  19 - 32 (mEq/L)    Glucose, Bld 119 (*) 70 - 99 (mg/dL)    BUN 25 (*) 6 - 23 (mg/dL)    Creatinine, Ser 1.91  0.50 - 1.10 (mg/dL)    Calcium 9.1  8.4 - 10.5 (mg/dL)    GFR calc non Af Amer 83 (*) >90 (mL/min)    GFR calc Af Amer >90  >90 (mL/min)   GLUCOSE, CAPILLARY     Status: Abnormal   Collection Time   10/14/11  8:41 AM      Component Value Range Comment   Glucose-Capillary 135 (*) 70 - 99 (mg/dL)   GLUCOSE, CAPILLARY     Status: Abnormal   Collection Time   10/14/11 12:01 PM      Component Value Range Comment   Glucose-Capillary 151 (*) 70 - 99 (mg/dL)   GLUCOSE, CAPILLARY     Status: Normal   Collection Time   10/14/11  4:13 PM      Component Value Range Comment   Glucose-Capillary 93  70 - 99 (mg/dL)   GLUCOSE, CAPILLARY     Status: Abnormal   Collection Time   10/14/11 11:32 PM      Component Value Range Comment   Glucose-Capillary 119 (*) 70 - 99 (mg/dL)    Comment 1 Notify RN     GLUCOSE, CAPILLARY     Status: Normal   Collection Time   10/15/11  8:13 AM      Component Value Range Comment   Glucose-Capillary 87  70 - 99 (mg/dL)    Comment 1 Documented in Chart      Comment 2 Notify RN      Assessment/Plan: 78 years old woman with epilepsy who came in with pneumonia and has a chronic aspiration risk due to COPD - 3 breakthrough seizures likely due to poor compliance secondary to mental status 1) I think continuing Keppra at current dose is reasonable 2) Would attempt to restart Lamictal PO given improved  mental status bearing in mind that she is a chronic aspiration risk 3) If she can take PO Lamictal, can restart PO Zonegran and stop  Keppra tomorrow 4) IF cannot take PO, would put in NGT and give Lamictal through NGT and continue Keppra with consideration regarding a PEG tube 5) Will follow  Brinsley Wence 10/15/2011, 9:58 AM

## 2011-10-15 NOTE — Progress Notes (Signed)
Patient discussed at the Long Length of Stay Marieanne Marxen Weeks 10/15/2011  

## 2011-10-15 NOTE — Progress Notes (Signed)
*  PRELIMINARY RESULTS* EEG has been performed.  Jessica [name] 10/15/2011, 11:27 AM

## 2011-10-16 ENCOUNTER — Inpatient Hospital Stay (HOSPITAL_COMMUNITY): Payer: Medicare Other

## 2011-10-16 DIAGNOSIS — J189 Pneumonia, unspecified organism: Secondary | ICD-10-CM

## 2011-10-16 DIAGNOSIS — R4182 Altered mental status, unspecified: Secondary | ICD-10-CM

## 2011-10-16 DIAGNOSIS — J441 Chronic obstructive pulmonary disease with (acute) exacerbation: Secondary | ICD-10-CM

## 2011-10-16 DIAGNOSIS — G40309 Generalized idiopathic epilepsy and epileptic syndromes, not intractable, without status epilepticus: Secondary | ICD-10-CM

## 2011-10-16 LAB — CBC
MCH: 27.5 pg (ref 26.0–34.0)
MCV: 85.8 fL (ref 78.0–100.0)
Platelets: 256 10*3/uL (ref 150–400)
RBC: 4 MIL/uL (ref 3.87–5.11)
RDW: 16.4 % — ABNORMAL HIGH (ref 11.5–15.5)
WBC: 10.2 10*3/uL (ref 4.0–10.5)

## 2011-10-16 LAB — BASIC METABOLIC PANEL
CO2: 27 mEq/L (ref 19–32)
Calcium: 8.6 mg/dL (ref 8.4–10.5)
Creatinine, Ser: 0.76 mg/dL (ref 0.50–1.10)
GFR calc non Af Amer: 79 mL/min — ABNORMAL LOW (ref >90)
Sodium: 138 mEq/L (ref 135–145)

## 2011-10-16 LAB — GLUCOSE, CAPILLARY
Glucose-Capillary: 105 mg/dL — ABNORMAL HIGH (ref 70–99)
Glucose-Capillary: 130 mg/dL — ABNORMAL HIGH (ref 70–99)

## 2011-10-16 MED ORDER — LAMOTRIGINE 200 MG PO TABS
200.0000 mg | ORAL_TABLET | Freq: Every day | ORAL | Status: DC
  Administered 2011-10-16 – 2011-10-19 (×4): 200 mg via ORAL
  Filled 2011-10-16 (×6): qty 1

## 2011-10-16 NOTE — Consult Note (Signed)
Subjective: Patient is doing well. Can take PO meds. No seizures.   Objective: Vital signs in last 24 hours: Temp:  [97.4 F (36.3 C)-98.3 F (36.8 C)] 98.3 F (36.8 C) (01/17 0400) Pulse Rate:  [57-73] 57  (01/17 0700) Resp:  [16-22] 18  (01/17 0700) BP: (86-126)/(36-56) 95/42 mmHg (01/17 0700) SpO2:  [89 %-98 %] 95 % (01/17 0700) FiO2 (%):  [40 %-50 %] 40 % (01/16 0900)  Intake/Output from previous day: 01/16 0701 - 01/17 0700 In: 800 [I.V.:690; IV Piggyback:110] Out: 631 [Urine:631] Intake/Output this shift:   Nutritional status: Dysphagia  Neurological exam: AAO*3. No aphasia. Followed complex commands. Mildly inattentive and slow to respond. Cranial nerves: EOMI, PERRL. Visual fields were full. Sensation to V1 through V3 areas of the face was intact and symmetric throughout. There was no facial asymmetry. Hearing to finger rub was equal and symmetrical bilaterally. Shoulder shrug was 5/5 and symmetric bilaterally. Head rotation was 5/5 bilaterally. There was no dysarthria or palatal deviation. Motor: strength was 5/5 and symmetric throughout. Sensory: was intact throughout to light touch, pinprick. Coordination: finger-to-nose were intact and symmetric bilaterally. Reflexes: were 2+ in upper extremities and 1+ at the knees and 1+ at the ankles. Plantar response was downgoing bilaterally. Gait: deferred   Lab Results:  Basename 10/16/11 0310 10/14/11 0507  WBC 10.2 --  HGB 11.0* --  HCT 34.3* --  PLT 256 --  NA 138 138  K 3.4* 3.9  CL 103 102  CO2 27 28  GLUCOSE 95 119*  BUN 25* 25*  CREATININE 0.76 0.65  CALCIUM 8.6 9.1  LABA1C -- --   Lipid Panel No results found for this basename: CHOL,TRIG,HDL,CHOLHDL,VLDL,LDLCALC in the last 72 hours  Studies/Results: Dg Chest Port 1 View  10/16/2011  *RADIOLOGY REPORT*  Clinical Data: Follow up of pneumonia.  COPD.  Hypertension. Tobacco use.  PORTABLE CHEST - 1 VIEW  Comparison: 10/12/2011  Findings: Underlying COPD/chronic  bronchitis. Numerous leads and wires project over the chest.  Normal heart size.  Resolution of small left pleural effusion. No pneumothorax.  Improved to resolved interstitial edema.  Right upper lobe calcified granuloma. Improved left base aeration with mild airspace disease remaining.  IMPRESSION:  1.  COPD/chronic bronchitis with improvement to resolution of interstitial edema. 2.  Resolved left pleural effusion with adjacent decreased airspace disease.  Favor atelectasis.  Original Report Authenticated By: Consuello Bossier, M.D.   Medications: I have reviewed the patient's current medications.  Assessment/Plan: 78 years old woman who came in for respiratory related problems and a as result could take her seizure medications. Respiratory issues are now improved and he swallow is better. PO Lamictal and Zonegran were restarted at previous doses. Can stop Keppra tomorrow. Call with questions.    LOS: 13 days   Jessica [name]

## 2011-10-16 NOTE — Progress Notes (Signed)
CSW submitted [place] for SNF at d/c. Pt resting. CSW to follow for SNF.  Vennie Homans,   [place]     10/16/2011 9:47 AM [phone]

## 2011-10-16 NOTE — Progress Notes (Signed)
Jessica [name] is a 78 y.o. female smoker, resident of independent living center admitted on 10/03/2011 with pneumonia, hypoxemia, change in mental status.  Dx 12/28 with positive Influenza A.  Developed VDRF 1/10.  Has hx of seizures.  Line/tube: ETT 1/10 >>>1/12 (self extubation)  Cultures: 12/28 Influenza PCR>>Flu A positive BC x 2 1/4 >>>negative MRSA Screen 1/10 >>Negative Urine Strep 1/11 >>>negative  Antibiotics: Primaxin (?HCAP) 1/9 >>>1/11  Vanc (?HCAP) 1/9 >>>1/11 Levaquin 1/11>>16  Tests/events: 1/4 CT head>>no acute findings 1/7 EEG>>no epileptiform acitivity 1/9 MRI brain>>no acute findings 1/11 CT chest>>no PE, Lt > Rt lower lobe ASD/ATX, extensive changes of emphysema, interstitial edema pattern, mild mediastinal/hilar adenopathy 1/11 ESR>>72, BNP>>1039 1/15 Generalized tonic/clonic seizure>>transfer back to ICU  SUBJECTIVE: Now fully awake, mental status is markedly improved, no distress, no further seizure activity. OBJECTIVE:  Blood pressure 96/44, pulse 64, temperature 97.8 F (36.6 C), temperature source Oral, resp. rate 18, height 5\' 3"  (1.6 m), weight 53.7 kg (118 lb 6.2 oz), SpO2 96.00%. Wt Readings from Last 3 Encounters:  10/14/11 53.7 kg (118 lb 6.2 oz)  09/26/11 62.8 kg (138 lb 7.2 oz)   Body mass index is 20.97 kg/(m^2).  I/O last 3 completed shifts: In: 1272 [I.V.:1162; IV Piggyback:110] Out: 1101 [Urine:1101]     General - no distress HEENT - dry oral mucosa Cardiac - s1s2 regular, tachycardic, no murmur Chest - prolonged exhalation, scattered rhonchi, no wheeze Abd - soft, nontender, + bowel sounds Ext - no edema Neuro - awake oriented, has no focal deficit  Lab 10/16/11 0310 10/14/11 0507 10/13/11 0315  NA 138 138 137  K 3.4* 3.9 3.5  CL 103 102 104  CO2 27 28 26   BUN 25* 25* 24*  CREATININE 0.76 0.65 0.56  GLUCOSE 95 119* 100*    Lab 10/16/11 0310 10/13/11 0315 10/12/11 0322  HGB 11.0* 12.4 11.4*  HCT 34.3* 38.3 34.9*    WBC 10.2 12.4* 17.4*  PLT 256 311 336     ASSESSMENT/PLAN:  Acute respiratory failure in setting of PNA with positive influenza A -Completed course of tamiflu and  7 days abx -titrate oxygen to keep SpO2 > 92% -solumedrol started 1/10 for possible post-viral BOOP>>transitioned to prednisone, we'll continue slow wean -f/u CXR intermittently  AECOPD>>improved -continue prednisone, scheduled bronchodilators  Encephalopathy, acute. This was due to postictal state following seizure. Acute seizures now resolved. Appreciate neurology recommendations.  Plan -continue oral lamictal, zonegran if able to swallow -Discontinue Keppra on 10/17/2011  Hx of HTN -continue oral medications  Steroid induced hyperglycemia -SSI  Dysphagia/protein calorie malnutrition -mechanical soft with thin liquids -defer MBSS for now  Debility -physical therapy to reassess -OOB to chair  Disposition We'll transfer to the ward, continue current therapy, eventually back to skilled nursing facility likely a next 72 hours.  Reviewed above, examined patient, and agree with assessment/plan.  Mental status and respiratory status improved.  No further recurrence of seizures.  If she remains seizure free, then will stop IV keppra on 1/18.  Would observe in hospital 24 hours after stopping IV AED's before transfer back to nursing home.  Coralyn Helling, MD Pager:  [phone] 10/16/2011, 2:25 PM

## 2011-10-16 NOTE — Progress Notes (Signed)
Physical Therapy Evaluation Patient Details Name: Jessica [name] MRN: [id number] DOB: 1932-06-12 Today's Date: 10/16/2011  [id number] ReEV, TA   Pt re-evaled for PT after VDRF and seizure.  She presents with increased weakness and decreased activity tolerance with O2 sats  Decreasing to the 70's with activity.  She will need continued PT at SNF at D/C  Problem List:  Patient Active Problem List  Diagnoses  . Bronchitis  . COPD (chronic obstructive pulmonary disease)  . Hypertension  . Hyperlipemia  . Tobacco abuse  . Fever and chills  . Diarrhea  . Encephalopathy  . Seizure  . PVD (peripheral vascular disease)  . Personal history of colon cancer  . Acute exacerbation of chronic obstructive pulmonary disease (COPD)    Past Medical History:  Past Medical History  Diagnosis Date  . Seizures   . GERD (gastroesophageal reflux disease)   . Peripheral vascular disease   . Anxiety   . Hyperlipidemia   . Hypertension   . Embolism - blood clot 1996    left breast  . Bone infection of left hand 12/22/2008    secondary to injury  . Hemorrhoids   . Cancer     colon cancer-hx of chemo  . Melanoma     metastatic   Past Surgical History:  Past Surgical History  Procedure Date  . C-sections 1957, 1959, 1961    x3  . Abdominal hysterectomy 1963  . Colon resection 1995  . Vocal cord injection 1996, 1997  . Breast biopsies     x2 on left, x 1 on right  . Hemorrhoid surgery   . Porta cath placement 1995  . Porta cath removal 1996  . Heart ablation 1997  . Bladder repair   . Left femoral artery stent 05/07/2004  . Melanoma removed from left leg 09/06/2004  . Melanoma removed from left groin 09/2004  . Skin graft to left leg 12/05/2004  . Orif left humerus fracture 03/27/2005  . Laser surgery to right eye 10/04/2007  . Removal of tumor in left knee 11/15/2010    PT Assessment/Plan/Recommendation PT Assessment Clinical Impression Statement: Since time of initial eval, pt has  had seizures and VDRF.  She is much weaker and deconditioned. requiring more than 5 liters of O2 for activity.  She will definitely need continued PT and SNF stay at [facility] to regain functional mobility PT Recommendation/Assessment: Patient will need skilled PT in the acute care venue PT Problem List: Decreased strength;Decreased activity tolerance;Decreased balance;Decreased mobility;Decreased safety awareness;Cardiopulmonary status limiting activity Barriers to Discharge: Decreased caregiver support PT Therapy Diagnosis : Difficulty walking;Abnormality of gait;Generalized weakness PT Plan PT Frequency: Min 3X/week PT Treatment/Interventions: DME instruction;Gait training;Functional mobility training;Therapeutic activities;Balance training PT Recommendation Recommendations for Other Services: OT consult Follow Up Recommendations: Skilled nursing facility Equipment Recommended: Defer to next venue PT Goals  Acute Rehab PT Goals PT Goal Formulation: Patient unable to participate in goal setting Time For Goal Achievement: 2 weeks Pt will go Supine/Side to Sit: with min assist PT Goal: Supine/Side to Sit - Progress: Goal set today Pt will go Sit to Stand: with min assist PT Goal: Sit to Stand - Progress: Goal set today Pt will Ambulate: >150 feet;with min assist;with least restrictive assistive device;Other (comment) (with O2 sats < 90%) PT Goal: Ambulate - Progress: Goal set today  PT Evaluation Precautions/Restrictions  Precautions Precautions: Fall Required Braces or Orthoses: No Restrictions Weight Bearing Restrictions: No Prior Functioning      Cognition Cognition Arousal/Alertness: Awake/alert Overall  Cognitive Status: Difficult to assess Difficult to assess due to: level of arousal (pt had been sleepy, but awakens to cooperate with PT) Attention: Appears overall intact for tasks assessed Current Attention Level: Focused Orientation Level: Oriented to person;Oriented to  place;Oriented to time Following Commands: Follows one step commands consistently Safety/Judgement: Decreased awareness of safety precautions Decreased Safety/Judgement: Decreased awareness of need for assistance Awareness of Errors: Decreased awareness of errors made Decreased Awareness of Errors: Assistance required to correct errors made;Assistance required to identify errors made Problem Solving: Requires assistance for problem solving Sensation/Coordination   Extremity Assessment RLE Assessment RLE Assessment: Within Functional Limits (diffuse muscle atropphy) LLE Assessment LLE Assessment: Within Functional Limits (pt with diffuse muscle atrophy) Mobility (including Balance) Bed Mobility Bed Mobility: Yes Supine to Sit: 3: Mod assist;HOB elevated (Comment degrees) (45) Supine to Sit Details (indicate cue type and reason): pt appears weaker, needs assist to get to EOB Sit to Supine: 1: +2 Total assist;Other (comment) (pt < 50%) Sit to Supine - Details (indicate cue type and reason): pt needs assist to lift legs up onto be and to move shoulders around Transfers Transfers: Yes Sit to Stand: 4: Min assist;With upper extremity assist;From bed Sit to Stand Details (indicate cue type and reason): pt needs assist to lift hips up to standing.  Pt keeps head down, O2 sats decreae with activity Stand to Sit: 1: +2 Total assist;Other (comment) (0t<50%) Stand to Sit Details: pt needs assist to guide hips Ambulation/Gait Ambulation/Gait: No Assistive device: Rolling walker  Posture/Postural Control Posture/Postural Control: Postural limitations Postural Limitations: thoracic kyphosis, forward head Balance Balance Assessed: No Exercise    End of Session PT - End of Session Equipment Utilized During Treatment: Gait belt Activity Tolerance: Treatment limited secondary to medical complications (Comment);Patient tolerated treatment well (O2 desaturation with activity) Patient left: in  bed;with call bell in reach (nurse in room) Nurse Communication: Mobility status for transfers General Behavior During Session: [facility] for tasks performed Cognition: Impaired  Donnetta Hail 10/16/2011, 4:43 PM

## 2011-10-16 NOTE — Progress Notes (Signed)
Made Elink MD aware of borderline adequate urine output.  Langley Gauss, RN

## 2011-10-16 NOTE — Procedures (Signed)
EEG ID:  [id number].  HISTORY:  A 78 year old woman with epilepsy.  MEDICATIONS:  Keppra, Lamictal.  CONDITION OF RECORDING:  This 16-lead EEG was recorded with this patient in awake and drowsy states.  Background rhythms: background patterns and wakefulness were well organized with a well-sustained posterior dominant rhythm of 9 Hz, symmetrical and reactive to eye opening and closing. Drowsiness was associated with mild attenuation of voltage and slowing frequencies.  Abnormal potentials: no epileptiform activity or focal slowing was noted.  ACTIVATION PROCEDURES:  Hyperventilation was not performed.  Photic stimulation did not activate tracing.  EKG:  Single-channel of EKG monitoring was unremarkable.  IMPRESSION:  This is a normal awake and drowsy EEG.  A normal EEG does not rule out the clinical diagnosis of epilepsy.  If clinically warranted, a repeat extended EEG or ambulatory requiring may be obtained for prolonged recording times and sleep capture, which may increase diagnostic yield.  Clinical correlation was suggested.          ______________________________ Carmell Austria, MD    MV:HQIO D:  10/15/2011 18:41:13  T:  10/16/2011 00:46:35  Job #:  [id number]

## 2011-10-17 ENCOUNTER — Telehealth: Payer: Self-pay | Admitting: Pulmonary Disease

## 2011-10-17 LAB — GLUCOSE, CAPILLARY
Glucose-Capillary: 83 mg/dL (ref 70–99)
Glucose-Capillary: 100 mg/dL — ABNORMAL HIGH (ref 70–99)

## 2011-10-17 NOTE — Progress Notes (Signed)
[id number] SLP Cancellation Note  SLP to see pt for repeat swallow evaluation per call from [facility] today.  Pt reportedly does not like current diet of dys3/thin as it limits her choices, per discussion with  RN.  Pt was transferred to the floor, spoke to ICU nurse.  Pt currently working with PT.  Will return today or next morning.  Pt currently afebrile on 3 liters oxygen.  No intake documented for several days.    From previous evaluations and given pt's h/o vocal fold surgery and injections, COPD, pt is a chronic aspiration risk.  Also suspect pt will not be compliant with diet restrictions or strategies given reported noncompliance with seizure medications.   Thanks.      Ct Angio Chest W/cm &/or Wo Cm 10/10/2011  *RADIOLOGY REPORT*  Clinical Data: Pulmonary embolism.  Hypoxemia.  Pneumonia.  CT ANGIOGRAPHY CHEST  Technique:  Multidetector CT imaging of the chest using the standard protocol during bolus administration of intravenous contrast. Multiplanar reconstructed images including MIPs were obtained and reviewed to evaluate the vascular anatomy.  Comparison: 10/09/2011.  Findings: Technically adequate study.  No pulmonary embolus.  Heart appears within normal limits.  No pericardial or pleural effusion. Coronary artery atherosclerosis is present. If office based assessment of coronary risk factors has not been performed, it is now recommended.  Incidental imaging the upper abdomen is within normal limits.  The stomach is decompressed with a nasogastric tube.  There is bilateral lower lobe collapse / consolidation.  Emphysema is present.  There is septal thickening extending to the apices, compatible with interstitial pulmonary edema superimposed on emphysema.  Mediastinal and bilateral hilar adenopathy is present which may be congestive or reactive.  Neoplasm is not completely excluded but not favored based on multiplicity of other findings.  Calcified granuloma is present in the inferior  right upper lobe measuring 7 mm (image 42 series 7).  Tiny left pleural effusion is present.  Aortic branch vessel atherosclerosis.  Thyroid gland grossly appears normal.  Hardware is present in the proximal left humerus compatible with prior ORIF for fracture.  No aggressive osseous lesions are identified.  Thoracic spondylosis with discal calcification.  IMPRESSION:  1.  Technically adequate study without pulmonary embolus.  No acute aortic abnormality. Atherosclerosis and coronary artery disease. 2.  Emphysema with septal thickening compatible with pulmonary edema and CHF. 3.  Bilateral lower lobe collapse / consolidation and airspace disease within the lingula which may represent alveolar edema or pneumonia.  Overall findings are compatible with multifocal pneumonia.   Dg Chest Port 1 View 10/16/2011  *RADIOLOGY REPORT*  Clinical Data: Follow up of pneumonia.  COPD.  Hypertension. Tobacco use.  PORTABLE CHEST - 1 VIEW  Comparison: 10/12/2011  Findings: Underlying COPD/chronic bronchitis. Numerous leads and wires project over the chest.  Normal heart size.  Resolution of small left pleural effusion. No pneumothorax.  Improved to resolved interstitial edema.  Right upper lobe calcified granuloma. Improved left base aeration with mild airspace disease remaining.  IMPRESSION:  1.  COPD/chronic bronchitis with improvement to resolution of interstitial edema. 2.  Resolved left pleural effusion with adjacent decreased airspace disease.  Favor atelectasis.  Original Report Authenticated By: Consuello Bossier, M.D.    Donavan Burnet, MS [facility] SLP [phone]

## 2011-10-17 NOTE — Progress Notes (Signed)
Physical Therapy Treatment Patient Details Name: Jessica [name] MRN: [id number] DOB: [birth year] Today's Date: 10/17/2011 1525-1550 2GT  PT Assessment/Plan  PT - Assessment/Plan Comments on Treatment Session: Patient tolerated ambulation without signs of hypoxia or SOB.  Still cautious and self limited, however. Will benefit from SNF level of care back at [facility]. PT Plan: Discharge plan remains appropriate PT Frequency: Min 3X/week Follow Up Recommendations: Skilled nursing facility Equipment Recommended: Defer to next venue PT Goals  Acute Rehab PT Goals Pt will go Supine/Side to Sit: with supervision PT Goal: Supine/Side to Sit - Progress: Updated due to goal met Pt will go Sit to Stand: with supervision PT Goal: Sit to Stand - Progress: Updated due to goal met Pt will Ambulate: >150 feet;with least restrictive assistive device;with min assist PT Goal: Ambulate - Progress: Progressing toward goal  PT Treatment Precautions/Restrictions  Precautions Precautions: Fall Required Braces or Orthoses: No Restrictions Weight Bearing Restrictions: No Mobility (including Balance) Bed Mobility Supine to Sit: 4: Min assist;With rails Sitting - Scoot to Edge of Bed: 3: Mod assist Sitting - Scoot to Edge of Bed Details (indicate cue type and reason): assisting to scoot on pad Transfers Transfers: Yes Sit to Stand: 4: Min assist;With upper extremity assist;From toilet;From bed Stand to Sit: 4: Min assist;With upper extremity assist;With armrests;To chair/3-in-1 Stand to Sit Details: cues for positioning and hand placement Ambulation/Gait Ambulation/Gait: Yes Ambulation/Gait Assistance: 3: Mod assist;4: Min assist Ambulation/Gait Assistance Details (indicate cue type and reason): min to mod assist on 3LO2 with SpO2 90-91% throughout; cues for eyes ahead and upright posture Ambulation Distance (Feet): 65 Feet Assistive device: Rolling walker Gait Pattern: Decreased stride  length;Trunk flexed  Posture/Postural Control Posture/Postural Control: Postural limitations Postural Limitations: kyphosis with forward head Exercise    End of Session PT - End of Session Equipment Utilized During Treatment: Gait belt Activity Tolerance: Patient tolerated treatment well Patient left: in chair;with call bell in reach;with family/visitor present General Behavior During Session: [facility] for tasks performed Cognition: Impaired  Kaylany Tesoriero,CYNDI 10/17/2011, 4:38 PM

## 2011-10-17 NOTE — Progress Notes (Signed)
Jessica [name] is a 78 y.o. female smoker, resident of independent living center admitted on 10/03/2011 with pneumonia, hypoxemia, change in mental status.  Dx 12/28 with positive Influenza A.  Developed VDRF 1/10.  Has hx of seizures.   Line/tube: ETT 1/10 >>>1/12 (self extubation)  Cultures: 12/28 Influenza PCR>>Flu A positive BC x 2 1/4 >>>negative MRSA Screen 1/10 >>Negative Urine Strep 1/11 >>>negative  Antibiotics: Primaxin (?HCAP) 1/9 >>>1/11  Vanc (?HCAP) 1/9 >>>1/11 Levaquin 1/11>>16  Tests/events: 1/4 CT head>>no acute findings 1/7 EEG>>no epileptiform acitivity 1/9 MRI brain>>no acute findings 1/11 CT chest>>no PE, Lt > Rt lower lobe ASD/ATX, extensive changes of emphysema, interstitial edema pattern, mild mediastinal/hilar adenopathy 1/11 ESR>>72, BNP>>1039 1/15 Generalized tonic/clonic seizure>>transfer back to ICU  SUBJECTIVE: Now fully awake, mental status is markedly improved, no distress, no further seizure activity. OBJECTIVE:  Blood pressure 120/51, pulse 70, temperature 98.4 F (36.9 C), temperature source Oral, resp. rate 16, height 5\' 3"  (1.6 m), weight 118 lb 6.2 oz (53.7 kg), SpO2 98.00%. Wt Readings from Last 3 Encounters:  10/14/11 118 lb 6.2 oz (53.7 kg)  09/26/11 138 lb 7.2 oz (62.8 kg)   Body mass index is 20.97 kg/(m^2).  I/O last 3 completed shifts: In: 780 [I.V.:450; IV Piggyback:330] Out: 971 [Urine:971]     General - no distress HEENT - dry oral mucosa Cardiac - s1s2 regular, tachycardic, no murmur Chest - resp's even/non-labored, lungs bilaterally clear Abd - soft, nontender, + bowel sounds Ext - no edema Neuro - awake oriented, has no focal deficit  Lab 10/16/11 0310 10/14/11 0507 10/13/11 0315  NA 138 138 137  K 3.4* 3.9 3.5  CL 103 102 104  CO2 27 28 26   BUN 25* 25* 24*  CREATININE 0.76 0.65 0.56  GLUCOSE 95 119* 100*    Lab 10/16/11 0310 10/13/11 0315 10/12/11 0322  HGB 11.0* 12.4 11.4*  HCT 34.3* 38.3 34.9*    WBC 10.2 12.4* 17.4*  PLT 256 311 336     ASSESSMENT/PLAN:  Acute respiratory failure in setting of PNA with positive influenza A -Completed course of tamiflu and  7 days abx -titrate oxygen to keep SpO2 > 92% -solumedrol started 1/10 for possible post-viral BOOP>>transitioned to prednisone, we'll continue slow wean -f/u CXR intermittently  AECOPD>>improved -continue prednisone, scheduled bronchodilators  Encephalopathy, acute. This was due to postictal state following seizure. Acute seizures now resolved. Appreciate neurology recommendations.  Plan -continue oral lamictal, zonegran if able to swallow -Keppra d/c 'd  1/18  Hx of HTN -continue oral medications  Steroid induced hyperglycemia -SSI  Dysphagia/protein calorie malnutrition Assessment: hx of vocal cord surgery Plan: -mechanical soft with thin liquids -MBSS per ST   Debility -physical therapy to reassess -OOB to chair  Disposition Tx  to the floor, continue current therapy, eventually back to skilled nursing facility 1/21 if no further seizures.   Canary Brim, NP-C  Pulmonary & Critical Care Pgr: [phone]  Reviewed above, examined pt and agree with assessment/plan.  No further seizure activity.  Will need to monitor off IV seizure meds.  Continue to work with PT.  Likely will be able to transfer back to NH next week.  Will ask [facility] to assume care from 1/19.  Coralyn Helling, MD 10/17/2011, 2:14 PM Pager:  [phone]

## 2011-10-17 NOTE — Progress Notes (Signed)
Report given to [facility], Pt assessment has not changed from am.

## 2011-10-17 NOTE — Progress Notes (Signed)
Speech Language/Pathology Clinical/Bedside Swallow Evaluation Patient Details  Name: Jessica [name] MRN: [id number] DOB: 1931/12/22 Today's Date: 10/17/2011 1735-1830  Past Medical History:  Past Medical History  Diagnosis Date  . Seizures   . GERD (gastroesophageal reflux disease)   . Peripheral vascular disease   . Anxiety   . Hyperlipidemia   . Hypertension   . Embolism - blood clot 1996    left breast  . Bone infection of left hand 12/22/2008    secondary to injury  . Hemorrhoids   . Cancer     colon cancer-hx of chemo  . Melanoma     metastatic   Past Surgical History:  Past Surgical History  Procedure Date  . C-sections 1957, 1959, 1961    x3  . Abdominal hysterectomy 1963  . Colon resection 1995  . Vocal cord injection 1996, 1997  . Breast biopsies     x2 on left, x 1 on right  . Hemorrhoid surgery   . Porta cath placement 1995  . Porta cath removal 1996  . Heart ablation 1997  . Bladder repair   . Left femoral artery stent 05/07/2004  . Melanoma removed from left leg 09/06/2004  . Melanoma removed from left groin 09/2004  . Skin graft to left leg 12/05/2004  . Orif left humerus fracture 03/27/2005  . Laser surgery to right eye 10/04/2007  . Removal of tumor in left knee 11/15/2010   HPI:  78 year old female admitted to [facility] 10/03/11 with pna, mental status change, hypoxemia.  Pt required intubation due to respiratory failure 10/09/11 with self-extubation on 10/11/11.  Pt had a BSE 10/06/11 without overt clinical s/s of aspiration and pt acknowledged h/o infrequent choking on intake requiring her to expectorate secretions.  Pt CT chest 10/10/11 showed no PE, left greater than right airspace disease/atx-emphysema.  Pt had previous surgery on vocal folds 1990 for nodule with injections 1995, 1997.  Pt denies dysphagia and desires regular/thin diet.  Most recent CXR  indicates improvement:  1.  COPD/chronic bronchitis with improvement to resolution of interstitial edema. 2.   Resolved left pleural effusion with adjacent decreased airspace disease.  Favor atelectasis..  Son present for today's evaluation.   Pt states vocal cord injections were not effective and head turn left posture did not improve phonation/airway closure.  Pt appears more appropriate and lucid today than any previous date.    Assessment/Recommendations/Treatment Plan Suspected Esophageal Findings Suspected Esophageal Findings: Other (comment) (known h/o GERD)  SLP Assessment Clinical Impression Statement: Pt appears with functional swallow at this time, suspect improved level of alertness/medical/respiratory status and being several days s/p self extubation has improved airway protection, decr edema.   Pt observed with 50% of meal tray with timely swalllow and clear voice.  Educated pt and son Jessica [name] to chronicity of aspiration risk d/t COPD and vocal fold injection and tips to decr amount of episodic aspiration.  As pt with tremendous improvement, recommend advance diet to regular thin with compensatory strategies.  Do not suspect MBS would change pt's outcomes as she does not demonstrate overt signs of dysphagia.  Pt and son agreeable to plan and general precautions.  SLP will sign off at this point, please reorder if desire.  Risk for Aspiration: Severe Other Related Risk Factors: History of pneumonia;History of GERD;History of esophageal-related issues;Decreased respiratory status  Swallow Evaluation Recommendations Solid Consistency: Regular Liquid Consistency: Thin Liquid Administration via: Cup;Straw Medication Administration: Other (Comment) (as pt tolerates) Supervision: Patient able to self feed  Compensations: Slow rate (rest breaks if dyspneic) Postural Changes and/or Swallow Maneuvers: Upright 30-60 min after meal;Seated upright 90 degrees Oral Care Recommendations: Oral care BID Follow up Recommendations: None  Treatment Plan Treatment Plan Recommendations: No treatment recommended at  this time   Individuals Consulted Consulted and Agree with Results and Recommendations: Patient;Family member/caregiver Family Member Consulted: son Administrator, sports Goals  SLP Swallowing Goals Goal #3: Pt will accept po boluses of nectar consistency with swallow initiation within 5 seconds and no s/s of aspiration to aid in determining appropriateness for MBS or dietary advancement.    MET   General  HPI: 78 year old female admitted [facility] 10/03/11 with pna, mental status change, hypoxemia.  Pt required intubation due to respiratory failure 10/09/11 with self-extubation on 10/11/11.  Pt had a BSE 10/06/11 without overt clinical s/s of aspiration and pt acknowledged h/o infrequent choking on intake requiring her to expectorate secretions.  Pt CT chest 10/10/11 showed no PE, left greater than right airspace disease/atx-emphysema.  Pt had previous surgery on vocal folds 1990 for nodule with injections 1995, 1997.  Pt denies dysphagia and desires regular/thin diet.  Most recent CXR  indicates improvement:  1.  COPD/chronic bronchitis with improvement to resolution of interstitial edema. 2.  Resolved left pleural effusion with adjacent decreased airspace disease.  Favor atelectasis..  Son present for today's evaluation.   Type of Study: Bedside swallow evaluation Diet Prior to this Study: Dysphagia 3 (soft);Thin liquids Respiratory Status: Supplemental O2 delivered via (comment) (3 liters) Behavior/Cognition: Alert;Cooperative;Pleasant mood Oral Cavity - Dentition: Adequate natural dentition Vision: Functional for self-feeding Patient Positioning: Upright in chair Baseline Vocal Quality: Low vocal intensity;Hoarse (voice mildly hoarse, not at baseline strength per pt/son) Volitional Cough: Weak Volitional Swallow: Able to elicit  Oral Motor/Sensory Function  Overall Oral Motor/Sensory Function: Appears within functional limits for tasks assessed  Consistency Results  Ice Chips Ice chips: Not  tested  Thin Liquid Thin Liquid: Within functional limits Presentation: Cup;Self Fed Other Comments: 2 ounces water, 2 ounces sweet tea  Nectar Thick Liquid Nectar Thick Liquid: Not tested  Honey Thick Liquid Honey Thick Liquid: Not tested  Puree Puree: Within functional limits Presentation: Self Fed;Spoon Other Comments: mashed potatoes 3 ounces  Solid Solid: Within functional limits Presentation: Self Fed Other Comments: brocolli, pork roat chopped, pear Donavan Burnet, MS [facility] SLP [phone]

## 2011-10-17 NOTE — Telephone Encounter (Signed)
[email or web address] x1 

## 2011-10-18 LAB — GLUCOSE, CAPILLARY
Glucose-Capillary: 99 mg/dL (ref 70–99)
Glucose-Capillary: 137 mg/dL — ABNORMAL HIGH (ref 70–99)
Glucose-Capillary: 128 mg/dL — ABNORMAL HIGH (ref 70–99)

## 2011-10-18 MED ORDER — ALBUTEROL SULFATE (5 MG/ML) 0.5% IN NEBU
2.5000 mg | INHALATION_SOLUTION | Freq: Two times a day (BID) | RESPIRATORY_TRACT | Status: DC
  Administered 2011-10-19 – 2011-10-20 (×3): 2.5 mg via RESPIRATORY_TRACT
  Filled 2011-10-18 (×4): qty 0.5

## 2011-10-18 NOTE — Progress Notes (Signed)
Subjective: Patient currently has no complaints, stable on 3 L of oxygen, no shortness of breath or chest pain  Objective: Vital signs in last 24 hours: Filed Vitals:   10/17/11 2220 10/18/11 0521 10/18/11 0905 10/18/11 0944  BP: 115/42 106/55  113/48  Pulse: 79 63  64  Temp: 98.1 F (36.7 C) 98.6 F (37 C)    TempSrc: Oral Oral    Resp: 16 16    Height:      Weight:      SpO2: 97% 93% 90%     Intake/Output Summary (Last 24 hours) at 10/18/11 1004 Last data filed at 10/18/11 8295  Gross per 24 hour  Intake    720 ml  Output    600 ml  Net    120 ml    Weight change:   General - no distress  HEENT - dry oral mucosa  Cardiac - s1s2 regular, tachycardic, no murmur  Chest - prolonged exhalation, scattered rhonchi, no wheeze  Abd - soft, nontender, + bowel sounds  Ext - no edema  Neuro - awake oriented, has no focal deficit   Lab Results: Results for orders placed during the hospital encounter of 10/03/11 (from the past 24 hour(s))  GLUCOSE, CAPILLARY     Status: Abnormal   Collection Time   10/17/11 11:39 AM      Component Value Range   Glucose-Capillary 100 (*) 70 - 99 (mg/dL)   Comment 1 Notify RN     Comment 2 Documented in Chart    GLUCOSE, CAPILLARY     Status: Abnormal   Collection Time   10/17/11  5:03 PM      Component Value Range   Glucose-Capillary 237 (*) 70 - 99 (mg/dL)  GLUCOSE, CAPILLARY     Status: Abnormal   Collection Time   10/17/11 10:17 PM      Component Value Range   Glucose-Capillary 101 (*) 70 - 99 (mg/dL)   Comment 1 Notify RN    GLUCOSE, CAPILLARY     Status: Abnormal   Collection Time   10/18/11  7:30 AM      Component Value Range   Glucose-Capillary 107 (*) 70 - 99 (mg/dL)   Comment 1 Documented in Chart     Comment 2 Notify RN       Micro: Recent Results (from the past 240 hour(s))  MRSA PCR SCREENING     Status: Normal   Collection Time   10/09/11  6:00 PM      Component Value Range Status Comment   MRSA by PCR NEGATIVE   NEGATIVE  Final     Studies/Results: No results found.  Medications:  Scheduled Meds:   . albuterol  2.5 mg Nebulization Q6H  . feeding supplement  1 Container Oral BID BM  . heparin subcutaneous  5,000 Units Subcutaneous Q8H  . insulin aspart  0-20 Units Subcutaneous TID WC  . insulin aspart  0-5 Units Subcutaneous QHS  . lamoTRIgine  150 mg Oral Daily  . lamoTRIgine  200 mg Oral QHS  . metoprolol tartrate  50 mg Oral BID  . olmesartan  20 mg Oral Daily  . olopatadine  1 drop Both Eyes Daily  . pantoprazole  40 mg Oral Q1200  . polyethylene glycol  17 g Oral Daily  . predniSONE  20 mg Oral Q breakfast  . zonisamide  100 mg Oral Daily  . DISCONTD: levetiracetam  1,000 mg Intravenous Q12H   Continuous Infusions:  PRN Meds:.acetaminophen (  TYLENOL) oral liquid 160 mg/5 mL, albuterol, hydrALAZINE, LORazepam, ondansetron (ZOFRAN) IV   Assessment:   Acute respiratory failure in setting of PNA with positive influenza A  -Completed course of tamiflu and 7 days abx  -titrate oxygen to keep SpO2 > 92%  -solumedrol started 1/10 for possible post-viral BOOP>>transitioned to prednisone, we'll continue slow wean  -f/u CXR intermittently   AECOPD>>improved  -continue prednisone, scheduled bronchodilators   Encephalopathy, acute. This was due to postictal state following seizure. Acute seizures now resolved. Appreciate neurology recommendations.  Plan  -continue oral lamictal, zonegran if able to swallow  -Discontinue Keppra on 10/17/2011  Hx of HTN  -continue oral medications  Steroid induced hyperglycemia  -SSI  Dysphagia/protein calorie malnutrition  -mechanical soft with thin liquids  -defer MBSS for now  Debility  -physical therapy to reassess  -OOB to chair    Disposition  Will discharge to skilled nursing facility possibly Monday  LOS: 15 days   [facility] 10/18/2011, 10:04 AM

## 2011-10-18 NOTE — Progress Notes (Signed)
Pt. Transferred to 1501 due to cardiac monitoring order.  Report given to [facility].  No change from morning assessment.

## 2011-10-19 LAB — BASIC METABOLIC PANEL
BUN: 16 mg/dL (ref 6–23)
Chloride: 106 mEq/L (ref 96–112)
GFR calc Af Amer: 68 mL/min — ABNORMAL LOW (ref >90)
GFR calc non Af Amer: 59 mL/min — ABNORMAL LOW (ref >90)
Potassium: 3.5 mEq/L (ref 3.5–5.1)
Sodium: 143 mEq/L (ref 135–145)

## 2011-10-19 LAB — GLUCOSE, CAPILLARY
Glucose-Capillary: 159 mg/dL — ABNORMAL HIGH (ref 70–99)
Glucose-Capillary: 104 mg/dL — ABNORMAL HIGH (ref 70–99)
Glucose-Capillary: 116 mg/dL — ABNORMAL HIGH (ref 70–99)
Glucose-Capillary: 99 mg/dL (ref 70–99)

## 2011-10-19 MED ORDER — METOPROLOL TARTRATE 50 MG PO TABS
50.0000 mg | ORAL_TABLET | ORAL | Status: DC
  Administered 2011-10-20: 50 mg via ORAL
  Filled 2011-10-19: qty 1

## 2011-10-19 NOTE — Discharge Summary (Addendum)
Physician Discharge Summary  WILNA PENNIE MRN: [id number] DOB/AGE: Jul 13, 1932 78 y.o.  PCP: Pamelia Hoit, MD, MD   Admit date: 10/03/2011 Discharge date: 10/19/2011  Discharge Diagnoses:     *Acute exacerbation of chronic obstructive pulmonary disease (COPD)  Self extubated on 10/11/2011  COPD (chronic obstructive pulmonary disease)  Hypertension  Hyperlipemia  Tobacco abuse  Encephalopathy  Seizure  PVD (peripheral vascular disease)  Personal history of colon cancer   Current Discharge Medication List    CONTINUE these medications which have NOT CHANGED   Details  cholecalciferol (VITAMIN D) 1000 UNITS tablet Take 2,000 Units by mouth daily.      esomeprazole (NEXIUM) 40 MG capsule Take 40 mg by mouth 2 (two) times daily.      estradiol (VIVELLE-DOT) 0.05 MG/24HR Place 1 patch onto the skin once a week.      lamoTRIgine (LAMICTAL) 150 MG tablet Take 150-200 mg by mouth daily. Take 150mg  every morning and 200mg  every afternoon.    metoprolol (TOPROL-XL) 50 MG 24 hr tablet Take 50 mg by mouth daily.      olopatadine (PATANOL) 0.1 % ophthalmic solution Place 1 drop into both eyes daily.      Oyster Shell (OYSTER CALCIUM PO) Take 1,500 mg by mouth 2 (two) times daily.      simvastatin (ZOCOR) 20 MG tablet Take 20 mg by mouth at bedtime.      valsartan (DIOVAN) 160 MG tablet Take 160 mg by mouth daily.      zonisamide (ZONEGRAN) 100 MG capsule Take 100 mg by mouth daily.      LORazepam (ATIVAN) 1 MG tablet Take 0.5-1 mg by mouth 2 (two) times daily as needed. For anxiety  Prednisone 20 mg for one week, 15 mg for one week, 10 mg for one week, 5 mg continue   DuoNeb every 6 hours   STOP taking these medications     levofloxacin (LEVAQUIN) 500 MG tablet      oseltamivir (TAMIFLU) 75 MG capsule         Discharge Condition: Stable Disposition: Skilled nursing facility   Consults: Pulmonary and critical care  Significant Diagnostic Studies: Dg  Chest 1 View  09/26/2011  *RADIOLOGY REPORT*  Clinical Data: Shortness of breath.  Cough.  CHEST - 1 VIEW  Comparison: Chest x-ray 08/21/2010 and PET scan 10/31/2010.  Findings: The heart size is normal.  A granuloma in the right upper lobe is stable.  Mild interstitial coarsening is chronic.  No focal airspace disease is evident.  IMPRESSION:  1.  No acute cardiopulmonary disease or significant interval change.  Original Report Authenticated By: Jamesetta Orleans. MATTERN, M.D.   Dg Chest 2 View  10/08/2011  *RADIOLOGY REPORT*  Clinical Data: Shortness of breath and weakness.  CHEST - 2 VIEW  Comparison: 10/06/2011  Findings: 1957 hours. The cardiopericardial silhouette is enlarged. Interstitial markings are diffusely coarsened with chronic features.  Worsening patchy airspace disease in the left mid and lower lung.  Probable small pleural effusions. Bones are diffusely demineralized.  IMPRESSION: Worsening left mid and lower lung airspace disease suggest progressing pneumonia.  Asymmetric edema is considered less likely.  Original Report Authenticated By: ERIC A. MANSELL, M.D.   X-ray Chest Pa And Lateral   09/27/2011  *RADIOLOGY REPORT*  Clinical Data: Shortness of breath.  CHEST - 2 VIEW  Comparison: 09/26/2011, 08/21/2010, and PET CT scan dated 10/31/2010  Findings: Heart size is normal.  The pulmonary vascularity is less prominent than on the  prior study.  No infiltrates or effusions. Small granuloma in the right midzone.  The lungs are hyperinflated consistent with of the C.  No acute osseous abnormality.  IMPRESSION: No acute abnormalities.  Compared to the prior study, the pulmonary vascularity is less prominent.  Emphysema.  Original Report Authenticated By: Gwynn Burly, M.D.   Ct Head Wo Contrast  10/03/2011  *RADIOLOGY REPORT*  Clinical Data: Sudden onset altered mental status.  History of hypertension.  History of melanoma.  CT HEAD WITHOUT CONTRAST  Technique:  Contiguous axial images were  obtained from the base of the skull through the vertex without contrast.  Comparison: 01/31/2011.  Findings: There is no evidence for acute infarction, intracranial hemorrhage, mass lesion, hydrocephalus, or extra-axial fluid.  Mild atrophy is present.  There is mild chronic microvascular ischemic change in the periventricular and subcortical white matter.  I see no areas of edema suspicious for metastatic disease.  The calvarium is intact.  There is advanced vascular calcification.  Acute maxillary sinusitis on the right is observed.  Mastoid air cells are clear.  IMPRESSION: Stable appearing atrophy and small vessel disease from May 2012. No visible acute intracranial findings.  No evidence for metastatic disease on this noncontrast examination.  Original Report Authenticated By: Elsie Stain, M.D.   Ct Angio Chest W/cm &/or Wo Cm  10/10/2011  *RADIOLOGY REPORT*  Clinical Data: Pulmonary embolism.  Hypoxemia.  Pneumonia.  CT ANGIOGRAPHY CHEST  Technique:  Multidetector CT imaging of the chest using the standard protocol during bolus administration of intravenous contrast. Multiplanar reconstructed images including MIPs were obtained and reviewed to evaluate the vascular anatomy.  Comparison: 10/09/2011.  Findings: Technically adequate study.  No pulmonary embolus.  Heart appears within normal limits.  No pericardial or pleural effusion. Coronary artery atherosclerosis is present. If office based assessment of coronary risk factors has not been performed, it is now recommended.  Incidental imaging the upper abdomen is within normal limits.  The stomach is decompressed with a nasogastric tube.  There is bilateral lower lobe collapse / consolidation.  Emphysema is present.  There is septal thickening extending to the apices, compatible with interstitial pulmonary edema superimposed on emphysema.  Mediastinal and bilateral hilar adenopathy is present which may be congestive or reactive.  Neoplasm is not  completely excluded but not favored based on multiplicity of other findings.  Calcified granuloma is present in the inferior right upper lobe measuring 7 mm (image 42 series 7).  Tiny left pleural effusion is present.  Aortic branch vessel atherosclerosis.  Thyroid gland grossly appears normal.  Hardware is present in the proximal left humerus compatible with prior ORIF for fracture.  No aggressive osseous lesions are identified.  Thoracic spondylosis with discal calcification.  IMPRESSION:  1.  Technically adequate study without pulmonary embolus.  No acute aortic abnormality. Atherosclerosis and coronary artery disease. 2.  Emphysema with septal thickening compatible with pulmonary edema and CHF. 3.  Bilateral lower lobe collapse / consolidation and airspace disease within the lingula which may represent alveolar edema or pneumonia.  Overall findings are compatible with multifocal pneumonia. 4.  Old granulomatous disease. 5.  Mediastinal and hilar adenopathy is probably reactive or congestive rather than neoplastic. 6.  Small left pleural effusion. 7.  Endotracheal tube and nasogastric tube appear in good position.  Original Report Authenticated By: Andreas Newport, M.D.   Mr Brain Wo Contrast  10/10/2011  *RADIOLOGY REPORT*  Clinical Data: 78 year old female in the ICU with encephalopathy. Altered mental status.  Pneumonia.  MRI HEAD WITHOUT CONTRAST  Technique:  Multiplanar, multiecho pulse sequences of the brain and surrounding structures were obtained according to standard protocol without intravenous contrast.  Comparison: Head CTs 10/03/2011 and earlier.  Findings: No restricted diffusion to suggest acute infarction.  No midline shift, mass effect, evidence of mass lesion, ventriculomegaly, extra-axial collection or acute intracranial hemorrhage.  Cervicomedullary junction and pituitary are within normal limits.  Major intracranial vascular flow voids are preserved.  Mild for age periventricular white  matter T2 and FLAIR hyperintensity.  Mild for age T2 heterogeneity in the deep gray matter nuclei and brain stem.  Minimal T2 heterogeneity in the cerebellum. Probable incidental developmental venous anomaly in the right cerebellar hemisphere.  Negative visualized cervical spine. Normal bone marrow signal.  Postoperative changes to the globes.  Minimal paranasal sinus mucosal thickening.  Minimal fluid signal in the mastoids. Negative scalp soft tissues.  IMPRESSION: No acute intracranial abnormality.  Mild for age nonspecific white and deep gray matter signal changes.  Original Report Authenticated By: Harley Hallmark, M.D.   Dg Chest Port 1 View  10/16/2011  *RADIOLOGY REPORT*  Clinical Data: Follow up of pneumonia.  COPD.  Hypertension. Tobacco use.  PORTABLE CHEST - 1 VIEW  Comparison: 10/12/2011  Findings: Underlying COPD/chronic bronchitis. Numerous leads and wires project over the chest.  Normal heart size.  Resolution of small left pleural effusion. No pneumothorax.  Improved to resolved interstitial edema.  Right upper lobe calcified granuloma. Improved left base aeration with mild airspace disease remaining.  IMPRESSION:  1.  COPD/chronic bronchitis with improvement to resolution of interstitial edema. 2.  Resolved left pleural effusion with adjacent decreased airspace disease.  Favor atelectasis.  Original Report Authenticated By: Consuello Bossier, M.D.   Dg Chest Port 1 View  10/12/2011  *RADIOLOGY REPORT*  Clinical Data: Follow up of pneumonia.  PORTABLE CHEST - 1 VIEW  Comparison: 10/10/2011  Findings: Patient rotated to the left. Normal heart size.  Small layering left pleural effusion.  Interval extubation and removal of nasogastric tube. No pneumothorax.  Interstitial thickening is increased since prior exam.  Left base air space disease is similar.  IMPRESSION:  1.  Increased interstitial thickening, suspicious for progressive pulmonary venous congestion/mild interstitial edema. 2.  Layering  small left pleural effusion with adjacent atelectasis or infection, unchanged. 3.  Extubation and removal of nasogastric tube.  Original Report Authenticated By: Consuello Bossier, M.D.   Dg Chest Port 1 View  10/10/2011  *RADIOLOGY REPORT*  Clinical Data: Pneumonia.  PORTABLE CHEST - 1 VIEW  Comparison: 10/09/2011  Findings: Endotracheal tube and NG tube are unchanged.  Mild hyperinflation of the lungs.  Patchy opacities throughout the left lung, most confluent in the left lung base again noted, unchanged. The small left pleural effusion.  Heart is normal size.  IMPRESSION: No significant change.  Original Report Authenticated By: Cyndie Chime, M.D.   Dg Chest Port 1 View  10/09/2011  *RADIOLOGY REPORT*  Clinical Data: Endotracheal tube placement.  Enteric tube placement.  PORTABLE CHEST - 1 VIEW  Comparison: 10/09/2011.  Findings: Endotracheal tube is now present, with the tip 58 mm from the carina.  ORIF hardware is present in the proximal left humerus. Cardiopericardial silhouette is unchanged.  There is progressive airspace disease in the left hemithorax.  New left pleural effusion is present layering inferiorly.  The right basilar atelectasis with possible airspace disease.  IMPRESSION:  1.  Interval placement of endotracheal tube and enteric tube. Proximal side  port of the enteric tube is seen in the proximal gastric fundus. 2.  Development of left greater than right lung airspace disease and small left pleural effusion.  Original Report Authenticated By: Andreas Newport, M.D.   Dg Chest Port 1 View  10/09/2011  *RADIOLOGY REPORT*  Clinical Data: Follow-up pneumonia, weakness  PORTABLE CHEST - 1 VIEW  Comparison: Plain film 10/08/2011, PET CT 10/31/2010 of the  Findings: Stable cardiac silhouette.  There are is a interstitial lung disease similar to prior.  There are nodules within the left upper lobe and right lower lobe may be increased compared to prior. Difficult to determine on the background of  interstitial lung disease.  No acute pneumonia.  IMPRESSION:  1.  No clear acute cardiopulmonary process. 2.  Chronic interstitial lung disease. 3.  Question increased nodularity in the left upper lobe and right lower lobe.  Consider non emergent CT thorax to compared to prior PET CT scan 10/31/2010.  Original Report Authenticated By: Genevive Bi, M.D.   Dg Chest Port 1 View  10/06/2011  *RADIOLOGY REPORT*  Clinical Data: Cough.  Shortness of breath.  Confusion.  PORTABLE CHEST - 1 VIEW  Comparison: 10/03/2011  Findings: Emphysema noted with accentuated interstitium and scattered increased nodularity particularly in the left lower lobe and left midlung.  Calcified granuloma noted above the minor fissure.  Biapical pleuroparenchymal scarring noted.  The heart size is within normal limits.  There is atherosclerotic calcification of the aorta.  Cannulated screw fixation of the left proximal humerus noted.  IMPRESSION:  1.  Increased interstitial accentuation with faint nodularity in the left midlung and left lower lobe.  This could reflect atypical pneumonia or early pneumonia. Edema is considered less likely given the lack of significant cardiomegaly. 2.  Atherosclerosis. 3.  Emphysema.  Original Report Authenticated By: Dellia Cloud, M.D.   Dg Chest Portable 1 View  10/03/2011  *RADIOLOGY REPORT*  Clinical Data: Cough, chest pain, shortness of breath  PORTABLE CHEST - 1 VIEW  Comparison: 09/27/2011  Findings: Chronic interstitial markings/emphysematous changes. Stable calcified granuloma in the lateral right upper lobe. No pleural effusion or pneumothorax.  Cardiomediastinal silhouette is within normal limits.  Prior fixation of the left proximal humerus.  IMPRESSION: No evidence of acute cardiopulmonary disease.  Chronic interstitial markings/emphysematous changes.  Original Report Authenticated By: Charline Bills, M.D.     Microbiology: Recent Results (from the past 240 hour(s))  MRSA PCR  SCREENING     Status: Normal   Collection Time   10/09/11  6:00 PM      Component Value Range Status Comment   MRSA by PCR NEGATIVE  NEGATIVE  Final      Labs: Results for orders placed during the hospital encounter of 10/03/11 (from the past 48 hour(s))  GLUCOSE, CAPILLARY     Status: Abnormal   Collection Time   10/17/11  5:03 PM      Component Value Range Comment   Glucose-Capillary 237 (*) 70 - 99 (mg/dL)   GLUCOSE, CAPILLARY     Status: Abnormal   Collection Time   10/17/11 10:17 PM      Component Value Range Comment   Glucose-Capillary 101 (*) 70 - 99 (mg/dL)    Comment 1 Notify RN     GLUCOSE, CAPILLARY     Status: Abnormal   Collection Time   10/18/11  7:30 AM      Component Value Range Comment   Glucose-Capillary 107 (*) 70 - 99 (mg/dL)  Comment 1 Documented in Chart      Comment 2 Notify RN     GLUCOSE, CAPILLARY     Status: Normal   Collection Time   10/18/11 11:23 AM      Component Value Range Comment   Glucose-Capillary 99  70 - 99 (mg/dL)   GLUCOSE, CAPILLARY     Status: Abnormal   Collection Time   10/18/11  6:25 PM      Component Value Range Comment   Glucose-Capillary 137 (*) 70 - 99 (mg/dL)    Comment 1 Notify RN     GLUCOSE, CAPILLARY     Status: Abnormal   Collection Time   10/18/11  9:39 PM      Component Value Range Comment   Glucose-Capillary 128 (*) 70 - 99 (mg/dL)   BASIC METABOLIC PANEL     Status: Abnormal   Collection Time   10/19/11  5:46 AM      Component Value Range Comment   Sodium 143  135 - 145 (mEq/L)    Potassium 3.5  3.5 - 5.1 (mEq/L)    Chloride 106  96 - 112 (mEq/L)    CO2 31  19 - 32 (mEq/L)    Glucose, Bld 93  70 - 99 (mg/dL)    BUN 16  6 - 23 (mg/dL)    Creatinine, Ser 1.61  0.50 - 1.10 (mg/dL)    Calcium 9.0  8.4 - 10.5 (mg/dL)    GFR calc non Af Amer 59 (*) >90 (mL/min)    GFR calc Af Amer 68 (*) >90 (mL/min)   GLUCOSE, CAPILLARY     Status: Abnormal   Collection Time   10/19/11  7:47 AM      Component Value Range  Comment   Glucose-Capillary 159 (*) 70 - 99 (mg/dL)   GLUCOSE, CAPILLARY     Status: Abnormal   Collection Time   10/19/11 11:46 AM      Component Value Range Comment   Glucose-Capillary 104 (*) 70 - 99 (mg/dL)      HPI :78 year old female resident of independent living center, who was diagnosed with influenza 09/26/2011. At bedtime she was also Tamiflu and admission however she refused admission and went back to the independent living center. The patient has not been doing well at home and 2 days ago she was moved to a more monitored bed. Today she became confused and was transported to the ER. History obtained mainly from the ER physician. During admission patient is alert but she is combative in her answers and refuses to give any information. There is evidence of for shortness of breath or wheezing. The patient does have a history of seizures and but is a report of any seizure activity. That being said there are some reports of her eyes rolled back of her head and vomiting.  HOSPITAL COURSE:   1. Acute Encephalopathy: Probably from acute illness (influenza), dehydration. The patient continued to have worsening encephalopathy on IV steroids. She was also initiated on; Haldol when necessary. CT of the head was found to be negative. A neurology consultation was obtained because of a history of seizure disorder and altered mental status. EEG did not show any epileptiform activity. There was right hemispheric slowing. An MRI of the brain was also obtained with results as above. ABG was done, that showed that the patient had developed acute respiratory failure. Chest x-ray on the ninth showed Worsening left mid and lower lung airspace disease suggesting progressing pneumonia. Rapid response  was called. She was found to be 88% on 4 L. She was intubated on the 10th. She self extubated on the 12th 12     2. Recent Influenza:/Pneumonia/COPD exacerbation she was treated with Primaxin for healthcare  associated pneumonia from January 9 through the 11th, She was treated with vancomycin from the ninth through the 11th She was treated with Levaquin from the 11th through January 16 She was treated with Tamiflu for a total of 5 days   3. Wheezing/COPD: Most recent chest x-ray on January 17 shows resolving left pleural effusion with decreased air space disease. She has been stable on 3 L of oxygen. Patient reports history of smoking. Her antibiotic course has been completed. She will continue with nebulizer treatments at the skilled nursing facility. She had a CT scan of the chest with results as above. It did not show any pulmonary embolism but showed bilateral infiltrates left greater than right.   4. History of Seizure: On the 15th patient had a generalized tonic-clonic seizure and was transferred back to the ICU. EEG did not show any epileptiform activity. She was continued on   lamictal and zonisamide.  5. Hypokalemia: This was repleted #6 other medical problems including hypothyroidism dyslipidemia gastroesophageal reflux disease and hypertension remained stable she was noted to have bradycardia with heart rate in of about 45 while awake. We will decrease the dose of her metoprolol to 50 mg by mouth daily.     Discharge Exam:  Blood pressure 117/45, pulse 45, temperature 98.5 F (36.9 C), temperature source Oral, resp. rate 18, height 5\' 4"  (1.626 m), weight 56 kg (123 lb 7.3 oz), SpO2 93.00%.   General - no distress  HEENT - dry oral mucosa  Cardiac - s1s2 regular, tachycardic, no murmur  Chest - resp's even/non-labored, lungs bilaterally clear  Abd - soft, nontender, + bowel sounds  Ext - no edema    Followup with PCP in 5-7 days Followup CBC and BMP in one week  Diet - low sodium heart healthy  Increase activity slowly  Call MD for: temperature >100.4  Call MD for: persistant nausea and vomiting  Call MD for: persistant dizziness or light-headedness     Signed: Richarda Overlie 10/19/2011, 1:58 PM

## 2011-10-19 NOTE — Progress Notes (Signed)
MD, pt's daughter would like for you to call her today (1/20) at [phone]. Thanks

## 2011-10-19 NOTE — Progress Notes (Signed)
Subjective: She is enjoying her diet, Objective: Vital signs in last 24 hours: Filed Vitals:   10/18/11 2154 10/19/11 0523 10/19/11 0736 10/19/11 1000  BP: 91/57 106/60  117/45  Pulse: 68 72  45  Temp: 95.7 F (35.4 C) 98.5 F (36.9 C)    TempSrc: Oral Oral    Resp: 18 18    Height:      Weight:      SpO2: 90% 93% 93%     Intake/Output Summary (Last 24 hours) at 10/19/11 1355 Last data filed at 10/19/11 1309  Gross per 24 hour  Intake    600 ml  Output    650 ml  Net    -50 ml    Weight change:   General - no distress  HEENT - dry oral mucosa  Cardiac - s1s2 regular, tachycardic, no murmur  Chest - prolonged exhalation, scattered rhonchi, no wheeze  Abd - soft, nontender, + bowel sounds  Ext - no edema  Neuro - awake oriented, has no focal deficit   Lab Results: Results for orders placed during the hospital encounter of 10/03/11 (from the past 24 hour(s))  GLUCOSE, CAPILLARY     Status: Abnormal   Collection Time   10/18/11  6:25 PM      Component Value Range   Glucose-Capillary 137 (*) 70 - 99 (mg/dL)   Comment 1 Notify RN    GLUCOSE, CAPILLARY     Status: Abnormal   Collection Time   10/18/11  9:39 PM      Component Value Range   Glucose-Capillary 128 (*) 70 - 99 (mg/dL)  BASIC METABOLIC PANEL     Status: Abnormal   Collection Time   10/19/11  5:46 AM      Component Value Range   Sodium 143  135 - 145 (mEq/L)   Potassium 3.5  3.5 - 5.1 (mEq/L)   Chloride 106  96 - 112 (mEq/L)   CO2 31  19 - 32 (mEq/L)   Glucose, Bld 93  70 - 99 (mg/dL)   BUN 16  6 - 23 (mg/dL)   Creatinine, Ser 1.61  0.50 - 1.10 (mg/dL)   Calcium 9.0  8.4 - 09.6 (mg/dL)   GFR calc non Af Amer 59 (*) >90 (mL/min)   GFR calc Af Amer 68 (*) >90 (mL/min)  GLUCOSE, CAPILLARY     Status: Abnormal   Collection Time   10/19/11  7:47 AM      Component Value Range   Glucose-Capillary 159 (*) 70 - 99 (mg/dL)  GLUCOSE, CAPILLARY     Status: Abnormal   Collection Time   10/19/11 11:46 AM   Component Value Range   Glucose-Capillary 104 (*) 70 - 99 (mg/dL)     Micro: Recent Results (from the past 240 hour(s))  MRSA PCR SCREENING     Status: Normal   Collection Time   10/09/11  6:00 PM      Component Value Range Status Comment   MRSA by PCR NEGATIVE  NEGATIVE  Final     Studies/Results: No results found.  Medications:  Scheduled Meds:   . albuterol  2.5 mg Nebulization BID  . feeding supplement  1 Container Oral BID BM  . heparin subcutaneous  5,000 Units Subcutaneous Q8H  . insulin aspart  0-20 Units Subcutaneous TID WC  . insulin aspart  0-5 Units Subcutaneous QHS  . lamoTRIgine  150 mg Oral Daily  . lamoTRIgine  200 mg Oral QHS  . metoprolol tartrate  50 mg Oral BID  . olmesartan  20 mg Oral Daily  . olopatadine  1 drop Both Eyes Daily  . pantoprazole  40 mg Oral Q1200  . polyethylene glycol  17 g Oral Daily  . predniSONE  20 mg Oral Q breakfast  . zonisamide  100 mg Oral Daily  . DISCONTD: albuterol  2.5 mg Nebulization Q6H   Continuous Infusions:  PRN Meds:.acetaminophen (TYLENOL) oral liquid 160 mg/5 mL, albuterol, hydrALAZINE, LORazepam, ondansetron (ZOFRAN) IV   Assessment:   Acute respiratory failure in setting of PNA with positive influenza A  -Completed course of tamiflu and 7 days abx  -titrate oxygen to keep SpO2 > 92%  -solumedrol started 1/10 for possible post-viral BOOP>>transitioned to prednisone, we'll continue slow wean  -f/u CXR intermittently  AECOPD>>improved  -continue prednisone, scheduled bronchodilators  Encephalopathy, acute. This was due to postictal state following seizure. Acute seizures now resolved. Appreciate neurology recommendations.  Plan  -continue oral lamictal, zonegran if able to swallow  -Discontinue Keppra on 10/17/2011  Hx of HTN  -continue oral medications  Steroid induced hyperglycemia  -SSI  Dysphagia/protein calorie malnutrition  -mechanical soft with thin liquids  -defer MBSS for now  Debility    -physical therapy to reassess  -OOB to chair    disposition discharged to SNF on Monday     LOS: 16 days   Jessica [name] 10/19/2011, 1:55 PM

## 2011-10-20 LAB — BASIC METABOLIC PANEL
BUN: 12 mg/dL (ref 6–23)
Calcium: 9.1 mg/dL (ref 8.4–10.5)
Creatinine, Ser: 0.86 mg/dL (ref 0.50–1.10)
GFR calc Af Amer: 74 mL/min — ABNORMAL LOW (ref >90)
GFR calc non Af Amer: 64 mL/min — ABNORMAL LOW (ref >90)

## 2011-10-20 MED ORDER — PREDNISONE 20 MG PO TABS: 20.0000 mg | ORAL_TABLET | Freq: Every day | ORAL | Status: AC

## 2011-10-20 MED ORDER — IPRATROPIUM-ALBUTEROL 0.5-2.5 (3) MG/3ML IN SOLN: 3.0000 mL | Freq: Four times a day (QID) | RESPIRATORY_TRACT | Status: DC

## 2011-10-20 MED ORDER — POTASSIUM CHLORIDE CRYS ER 20 MEQ PO TBCR
40.0000 meq | EXTENDED_RELEASE_TABLET | Freq: Three times a day (TID) | ORAL | Status: DC
  Administered 2011-10-20: 40 meq via ORAL
  Filled 2011-10-20 (×3): qty 2

## 2011-10-20 NOTE — Telephone Encounter (Signed)
[email or web address] Castillo, CMA  

## 2011-10-20 NOTE — Telephone Encounter (Signed)
Pt returning call Krista L Lilly  °

## 2011-10-20 NOTE — Telephone Encounter (Signed)
Spoke with Wynona Canes and she states that she has already spoken with Dr. Craige Cotta and he has answered all of her questions. Nothing further needed.

## 2011-10-20 NOTE — Progress Notes (Signed)
Discharge instructions accompanied pt, left the unit  In stable condition to SNF.

## 2011-10-20 NOTE — Progress Notes (Addendum)
Patient cleared for discharge, however, [facility] [employer] is closed today so CSW unable to obtain auth for discharge to SNF. Faxed discharge summary to [facility].  Khrystian Schauf C. Aidyn Sportsman MSW, [place] [phone] CSW received call from [facility], the spoke with their business office and can take patient without an [place] today.

## 2011-11-25 ENCOUNTER — Emergency Department (HOSPITAL_COMMUNITY)
Admission: EM | Admit: 2011-11-25 | Discharge: 2011-11-26 | Disposition: A | Discharge status: Home / Self Care | Payer: Medicare Other | Attending: Emergency Medicine | Admitting: Emergency Medicine

## 2011-11-25 ENCOUNTER — Other Ambulatory Visit: Payer: Self-pay

## 2011-11-25 DIAGNOSIS — K219 Gastro-esophageal reflux disease without esophagitis: Secondary | ICD-10-CM | POA: Insufficient documentation

## 2011-11-25 DIAGNOSIS — I739 Peripheral vascular disease, unspecified: Secondary | ICD-10-CM | POA: Insufficient documentation

## 2011-11-25 DIAGNOSIS — I4949 Other premature depolarization: Secondary | ICD-10-CM | POA: Insufficient documentation

## 2011-11-25 DIAGNOSIS — E785 Hyperlipidemia, unspecified: Secondary | ICD-10-CM | POA: Insufficient documentation

## 2011-11-25 DIAGNOSIS — R569 Unspecified convulsions: Secondary | ICD-10-CM | POA: Insufficient documentation

## 2011-11-25 DIAGNOSIS — Z9079 Acquired absence of other genital organ(s): Secondary | ICD-10-CM | POA: Insufficient documentation

## 2011-11-25 DIAGNOSIS — I10 Essential (primary) hypertension: Secondary | ICD-10-CM | POA: Insufficient documentation

## 2011-11-25 DIAGNOSIS — F17200 Nicotine dependence, unspecified, uncomplicated: Secondary | ICD-10-CM | POA: Insufficient documentation

## 2011-11-25 DIAGNOSIS — Z85820 Personal history of malignant melanoma of skin: Secondary | ICD-10-CM | POA: Insufficient documentation

## 2011-11-25 DIAGNOSIS — Z86718 Personal history of other venous thrombosis and embolism: Secondary | ICD-10-CM | POA: Insufficient documentation

## 2011-11-25 DIAGNOSIS — Z85038 Personal history of other malignant neoplasm of large intestine: Secondary | ICD-10-CM | POA: Insufficient documentation

## 2011-11-25 DIAGNOSIS — F411 Generalized anxiety disorder: Secondary | ICD-10-CM | POA: Insufficient documentation

## 2011-11-25 LAB — URINALYSIS, ROUTINE W REFLEX MICROSCOPIC
Glucose, UA: NEGATIVE mg/dL
Hgb urine dipstick: NEGATIVE
Ketones, ur: NEGATIVE mg/dL
Protein, ur: NEGATIVE mg/dL
Urobilinogen, UA: 1 mg/dL (ref 0.0–1.0)

## 2011-11-25 LAB — BASIC METABOLIC PANEL
CO2: 26 mEq/L (ref 19–32)
Calcium: 9.2 mg/dL (ref 8.4–10.5)
Creatinine, Ser: 0.65 mg/dL (ref 0.50–1.10)
GFR calc non Af Amer: 83 mL/min — ABNORMAL LOW (ref >90)
Glucose, Bld: 91 mg/dL (ref 70–99)

## 2011-11-25 LAB — CBC
HCT: 38 % (ref 36.0–46.0)
MCH: 28.8 pg (ref 26.0–34.0)
MCHC: 32.4 g/dL (ref 30.0–36.0)
MCV: 89 fL (ref 78.0–100.0)
Platelets: 244 10*3/uL (ref 150–400)
RDW: 16.4 % — ABNORMAL HIGH (ref 11.5–15.5)
WBC: 9.3 10*3/uL (ref 4.0–10.5)

## 2011-11-25 LAB — DIFFERENTIAL
Basophils Absolute: 0.1 10*3/uL (ref 0.0–0.1)
Eosinophils Absolute: 0.1 10*3/uL (ref 0.0–0.7)
Eosinophils Relative: 1 % (ref 0–5)
Lymphocytes Relative: 22 % (ref 12–46)
Monocytes Absolute: 0.7 10*3/uL (ref 0.1–1.0)

## 2011-11-25 NOTE — ED Notes (Signed)
Pt. Cleaned up of b/m and dirty underwear removed. Pt. Placed on bedpan for urine sample. Pt. Assisted off of bedpan, placed in mesh panties and repositioned in the bed for comfort. No other needs voiced at this time.

## 2011-11-25 NOTE — ED Provider Notes (Addendum)
History     CSN: [id number]  Arrival date & time 11/25/11  2136   First MD Initiated Contact with Patient 11/25/11 2150     (Consider location/radiation/quality/duration/timing/severity/associated sxs/prior treatment) HPI Patient presents to the emergency room following a seizure today. History is limited by the fact that the patient cannot remember what occurred. She has known history of seizure disorder. She takes Lamictal and zonisamide.  Patient states sometime today she had a generalized tonic-clonic seizure. The patient states pretty sure her that is what occurred because she's had them before. She does remember earlier in the day she was not feeling well.  Patient apparently was found by staff at the assisted living facility lying on the ground. Patient was sent to the hospital to be checked. Patient denies any complaints right now. She is alert and oriented. She has no headache, chest pain, fever, vomiting or diarrhea Past Medical History  Diagnosis Date  . Seizures   . GERD (gastroesophageal reflux disease)   . Peripheral vascular disease   . Anxiety   . Hyperlipidemia   . Hypertension   . Embolism - blood clot 1996    left breast  . Bone infection of left hand 12/22/2008    secondary to injury  . Hemorrhoids   . Cancer     colon cancer-hx of chemo  . Melanoma     metastatic    Past Surgical History  Procedure Date  . C-sections 1957, 1959, 1961    x3  . Abdominal hysterectomy 1963  . Colon resection 1995  . Vocal cord injection 1996, 1997  . Breast biopsies     x2 on left, x 1 on right  . Hemorrhoid surgery   . Porta cath placement 1995  . Porta cath removal 1996  . Heart ablation 1997  . Bladder repair   . Left femoral artery stent 05/07/2004  . Melanoma removed from left leg 09/06/2004  . Melanoma removed from left groin 09/2004  . Skin graft to left leg 12/05/2004  . Orif left humerus fracture 03/27/2005  . Laser surgery to right eye 10/04/2007  . Removal of  tumor in left knee 11/15/2010    No family history on file.  History  Substance Use Topics  . Smoking status: Current Everyday Smoker -- 1.0 packs/day for 60 years  . Smokeless tobacco: Never Used  . Alcohol Use: No    OB History    Grav Para Term Preterm Abortions TAB SAB Ect Mult Living                  Review of Systems  All other systems reviewed and are negative.    Allergies  Aspirin; Venlafaxine; Penicillins; and Zithromax  Home Medications   Current Outpatient Rx  Name Route Sig Dispense Refill  . CALCIUM CARBONATE 1250 MG PO TABS Oral Take 3 tablets by mouth 2 (two) times daily.    Marland Kitchen VITAMIN D 1000 UNITS PO TABS Oral Take 2,000 Units by mouth daily.      Marland Kitchen ESOMEPRAZOLE MAGNESIUM 40 MG PO CPDR Oral Take 40 mg by mouth 2 (two) times daily.      Marland Kitchen ESTRADIOL 0.05 MG/24HR TD PTTW Transdermal Place 1 patch onto the skin once a week.      Marland Kitchen LAMOTRIGINE 150 MG PO TABS Oral Take 150 mg by mouth every morning.     Marland Kitchen LAMOTRIGINE 200 MG PO TABS Oral Take 200 mg by mouth at bedtime.    Marland Kitchen LORAZEPAM 1  MG PO TABS Oral Take 1 mg by mouth 2 (two) times daily. For anxiety    . METOPROLOL SUCCINATE ER 50 MG PO TB24 Oral Take 50 mg by mouth daily.      . OLOPATADINE HCL 0.1 % OP SOLN Both Eyes Place 1 drop into both eyes daily.      Marland Kitchen SIMVASTATIN 20 MG PO TABS Oral Take 20 mg by mouth at bedtime.      Marland Kitchen VALSARTAN 160 MG PO TABS Oral Take 160 mg by mouth daily.      Marland Kitchen ZONISAMIDE 100 MG PO CAPS Oral Take 100 mg by mouth daily.        BP 164/84  Pulse 94  Temp(Src) 98.5 F (36.9 C) (Oral)  Resp 20  SpO2 96%  Physical Exam  Nursing note and vitals reviewed. Constitutional: She appears well-developed and well-nourished. No distress.  HENT:  Head: Normocephalic and atraumatic.  Right Ear: External ear normal.  Left Ear: External ear normal.  Eyes: Conjunctivae are normal. Right eye exhibits no discharge. Left eye exhibits no discharge. No scleral icterus.  Neck: Neck supple.  No tracheal deviation present.  Cardiovascular: Normal rate, regular rhythm and intact distal pulses.   Pulmonary/Chest: Effort normal and breath sounds normal. No stridor. No respiratory distress. She has no wheezes. She has no rales.  Abdominal: Soft. Bowel sounds are normal. She exhibits no distension. There is no tenderness. There is no rebound and no guarding.  Musculoskeletal: She exhibits no edema and no tenderness.       Cervical back: She exhibits no tenderness.  Neurological: She is alert. She has normal strength. No sensory deficit. Cranial nerve deficit:  no gross defecits noted. She exhibits normal muscle tone. She displays no seizure activity. Coordination normal.       Patient is oriented to person and place  Skin: Skin is warm and dry. No rash noted.  Psychiatric: She has a normal mood and affect.    ED Course  Procedures (including critical care time)  Date: 11/25/2011  Rate: 86  Rhythm: normal sinus rhythm  QRS Axis: normal  Intervals: normal  ST/T Wave abnormalities: normal  Conduction Disutrbances:none  Narrative Interpretation: PVC noted on current EKG, none noted previously  Old EKG Reviewed: none available and unchanged   Labs Reviewed  CBC - Abnormal; Notable for the following:    RDW 16.4 (*)    All other components within normal limits  BASIC METABOLIC PANEL - Abnormal; Notable for the following:    GFR calc non Af Amer 83 (*)    All other components within normal limits  DIFFERENTIAL  URINALYSIS, ROUTINE W REFLEX MICROSCOPIC   No results found.   1. Seizure       MDM  Patient is alert and oriented here in the emergency room. She appears to be at her baseline. She no longer appears to be post ictal. Although no one witnessed a seizure I suspect she did have another seizure this evening. At this time she is stable for discharge and I feel she can followup with her neurologist.        Celene Kras, MD 11/26/11 0002  Celene Kras,  MD 11/26/11 [phone]

## 2011-11-25 NOTE — ED Notes (Signed)
Pt via ems to tx rm. Pt reports having a seizure "sometime" this afternoon. Pt was found on the floor by staff(pt lives at ALF) at five pm. Pt sent at this time to ER for "lab work". Pt is alert and oriented with some mild confusion which is baseline. Pt has 20g in left forearm. Denies any injuries or pain.

## 2011-11-25 NOTE — ED Notes (Signed)
MD at bedside. 

## 2011-11-25 NOTE — ED Notes (Signed)
Pt states she feels fine and has no pain or complaints otherwise. NSR noted on monitor. VSS. Resp are unlabored. Skin warm and dry. No acute distress.

## 2011-11-26 NOTE — Discharge Instructions (Signed)
Epilepsy °A seizure (convulsion) is a sudden change in brain function that causes a change in behavior, muscle activity, or ability to remain awake and alert. If a person has recurring seizures, this is called epilepsy. °CAUSES  °Epilepsy is a disorder with many possible causes. Anything that disturbs the normal pattern of brain cell activity can lead to seizures. Seizure can be caused from illness to brain damage to abnormal brain development. Epilepsy may develop because of: °· An abnormality in brain wiring.  °· An imbalance of nerve signaling chemicals (neurotransmitters).  °· Some combination of these factors.  °Scientists are learning an increasing amount about genetic causes of seizures. °SYMPTOMS  °The symptoms of a seizure can vary greatly from one person to another. These may include: °· An aura, or warning that tells a person they are about to have a seizure.  °· Abnormal sensations, such as abnormal smell or seeing flashing lights.  °· Sudden, general body stiffness.  °· Rhythmic jerking of the face, arm, or leg - on one or both sides.  °· Sudden change in consciousness.  °· The person may appear to be awake but not responding.  °· They may appear to be asleep but cannot be awakened.  °· Grimacing, chewing, lip smacking, or drooling.  °· Often there is a period of sleepiness after a seizure.  °DIAGNOSIS  °The description you give to your caregiver about what you experienced will help them understand your problems. Equally important is the description by any witnesses to your seizure. A physical exam, including a detailed neurological exam, is necessary. An EEG (electroencephalogram) is a painless test of your brain waves. In this test a diagram is created of your brain waves. These diagrams can be interpreted by a specialist. Pictures of your brain are usually taken with: °· An MRI.  °· A CT scan.  °Lab tests may be done to look for: °· Signs of infection.  °· Abnormal blood chemistry.  °PREVENTION    °There is no way to prevent the development of epilepsy. If you have seizures that are typically triggered by an event (such as flashing lights), try to avoid the trigger. This can help you avoid a seizure.  °PROGNOSIS  °Most people with epilepsy lead outwardly normal lives. While epilepsy cannot currently be cured, for some people it does eventually go away. Most seizures do not cause brain damage. It is not uncommon for people with epilepsy, especially children, to develop behavioral and emotional problems. These problems are sometimes the consequence of medicine for seizures or social stress. For some people with epilepsy, the risk of seizures restricts their independence and recreational activities. For example, some states refuse drivers licenses to people with epilepsy. °Most women with epilepsy can become pregnant. They should discuss their epilepsy and the medicine they are taking with their caregivers. Women with epilepsy have a 90 percent or better chance of having a normal, healthy baby. °RISKS AND COMPLICATIONS  °People with epilepsy are at increased risk of falls, accidents, and injuries. People with epilepsy are at special risk for two life-threatening conditions. These are status epilepticus and sudden unexplained death (extremely rare). Status epilepticus is a long lasting, continuous seizure that is a medical emergency. °TREATMENT  °Once epilepsy is diagnosed, it is important to begin treatment as soon as possible. For about 80 percent of those diagnosed with epilepsy, seizures can be controlled with modern medicines and surgical techniques. Some antiepileptic drugs can interfere with the effectiveness of oral contraceptives. In 1997, the   FDA approved a pacemaker for the brain the (vagus nerve stimulator). This stimulator can be used for people with seizures that are not well-controlled by medicine. Studies have shown that in some cases, children may experience fewer seizures if they maintain a  strict diet. The strict diet is called the ketogenic diet. This diet is rich in fats and low in carbohydrates. °HOME CARE INSTRUCTIONS  °· Your caregiver will make recommendations about driving and safety in normal activities. Follow these carefully.  °· Take any medicine prescribed exactly as directed.  °· Do any blood tests requested to monitor the levels of your medicine.  °· The people you live and work with should know that you are prone to seizures. They should receive instructions on how to help you. In general, a witness to a seizure should:  °· Cushion your head and body.  °· Turn you on your side.  °· Avoid unnecessarily restraining you.  °· Not place anything inside your mouth.  °· Call for local emergency medical help if there is any question about what has occurred.  °· Keep a seizure diary. Record what you recall about any seizure, especially any possible trigger.  °· If your caregiver has given you a follow-up appointment, it is very important to keep that appointment. Not keeping the appointment could result in permanent injury and disability. If there is any problem keeping the appointment, you must call back to this facility for assistance.  °SEEK MEDICAL CARE IF:  °· You develop signs of infection or other illness. This might increase the risk of a seizure.  °· You seem to be having more frequent seizures.  °· Your seizure pattern is changing.  °SEEK IMMEDIATE MEDICAL CARE IF:  °· A seizure does not stop after a few moments.  °· A seizure causes any difficulty in breathing.  °· A seizure results in a very severe headache.  °· A seizure leaves you with the inability to speak or use a part of your body.  °MAKE SURE YOU:  °· Understand these instructions.  °· Will watch your condition.  °· Will get help right away if you are not doing well or get worse.  °Document Released: 09/15/2005 Document Revised: 05/28/2011 Document Reviewed: 04/21/2008 °[employer]® Patient Information ©2012 [employer]. °

## 2011-11-26 NOTE — ED Notes (Signed)
[facility] here to transport patient. Patient stable and in no acute distress at time of transport.

## 2011-11-26 NOTE — ED Notes (Signed)
Report to [facility] nurse at [facility]. Pt awaiting [facility] in no acute distress.

## 2011-11-26 NOTE — ED Notes (Signed)
Pt ambulated in hallway without difficulty.  Will call [facility] for transport.

## 2011-12-03 DIAGNOSIS — R609 Edema, unspecified: Secondary | ICD-10-CM

## 2011-12-03 HISTORY — DX: Edema, unspecified: R60.9

## 2012-10-21 HISTORY — PX: COLONOSCOPY: SHX174

## 2012-10-25 ENCOUNTER — Other Ambulatory Visit: Payer: Self-pay | Admitting: Gastroenterology

## 2013-02-03 ENCOUNTER — Non-Acute Institutional Stay: Payer: Medicare Other | Admitting: Nurse Practitioner

## 2013-02-03 VITALS — BP 188/84 | HR 76 | Ht 64.0 in | Wt 129.0 lb

## 2013-02-03 DIAGNOSIS — K219 Gastro-esophageal reflux disease without esophagitis: Secondary | ICD-10-CM

## 2013-02-03 DIAGNOSIS — R569 Unspecified convulsions: Secondary | ICD-10-CM

## 2013-02-03 DIAGNOSIS — J449 Chronic obstructive pulmonary disease, unspecified: Secondary | ICD-10-CM

## 2013-02-03 DIAGNOSIS — J4489 Other specified chronic obstructive pulmonary disease: Secondary | ICD-10-CM

## 2013-02-03 DIAGNOSIS — I10 Essential (primary) hypertension: Secondary | ICD-10-CM

## 2013-02-03 NOTE — Assessment & Plan Note (Signed)
Elevated SBP 188/84 today, she stated she has not blood pressure meds for a week-run out of prescription. She had her blood pressure medications before her appointment. She denied HA, lightheadedness, dizziness, chest pain/pressure, palpitation, or SOB. The patient was instructed to take extra dose of Metoprolol at hs if her SBP>160 and SBP>80. Check Bp daily, return in 2weeks.

## 2013-02-03 NOTE — Assessment & Plan Note (Signed)
No seizure activities, takes Zonisamide 100mg  I in am, II in pm, Lamictal 150mg  I am and II pm.

## 2013-02-03 NOTE — Assessment & Plan Note (Signed)
Flares up, took herself off Nexium, desires to resume Nexium 40mg  daily

## 2013-02-03 NOTE — Assessment & Plan Note (Signed)
Chronic cough, no plan of smoke cessation.

## 2013-02-03 NOTE — Progress Notes (Signed)
Patient ID: Jessica [name], female   DOB: 09/02/1932, 79 y.o.   MRN: [id number]  Chief Complaint:  Chief Complaint  Patient presents with  . Medical Managment of Chronic Issues    blood pressure, COPD, GERD, seizuire     HPI:   Problem List Items Addressed This Visit     ICD-9-CM   COPD (chronic obstructive pulmonary disease)     Chronic cough, no plan of smoke cessation.     Seizure     No seizure activities, takes Zonisamide 100mg  I in am, II in pm, Lamictal 150mg  I am and II pm.     GERD (gastroesophageal reflux disease) - Primary     Flares up, took herself off Nexium, desires to resume Nexium 40mg  daily    Hypertension (Chronic)     Elevated SBP 188/84 today, she stated she has not blood pressure meds for a week-run out of prescription. She had her blood pressure medications before her appointment. She denied HA, lightheadedness, dizziness, chest pain/pressure, palpitation, or SOB. The patient was instructed to take extra dose of Metoprolol at hs if her SBP>160 and SBP>80. Check Bp daily, return in 2weeks.        Review of Systems:  Review of Systems  Constitutional: Positive for weight loss. Negative for fever, chills, malaise/fatigue and diaphoresis.  HENT: Negative for hearing loss, congestion, sore throat and neck pain.   Eyes: Negative for pain, discharge and redness.  Respiratory: Positive for cough and sputum production. Negative for shortness of breath and wheezing.   Cardiovascular: Positive for leg swelling (trace in LLE). Negative for chest pain, palpitations, orthopnea and claudication.  Gastrointestinal: Positive for heartburn. Negative for nausea, vomiting, abdominal pain, diarrhea, constipation and blood in stool.  Genitourinary: Positive for frequency. Negative for dysuria, urgency, hematuria and flank pain.  Musculoskeletal: Negative for myalgias, back pain, joint pain and falls.  Skin: Negative for itching and rash.  Neurological: Negative for  dizziness, tingling, tremors, sensory change, speech change, focal weakness, seizures, weakness and headaches.  Endo/Heme/Allergies: Negative for environmental allergies and polydipsia. Does not bruise/bleed easily.  Psychiatric/Behavioral: Positive for memory loss. Negative for depression, hallucinations and substance abuse. The patient is nervous/anxious. The patient does not have insomnia.      Medications: Patient's Medications  New Prescriptions   No medications on file  Previous Medications   ESOMEPRAZOLE (NEXIUM) 40 MG CAPSULE    Take 40 mg by mouth 2 (two) times daily.     ESTRADIOL (VIVELLE-DOT) 0.05 MG/24HR    Place 1 patch onto the skin once a week.     LAMOTRIGINE (LAMICTAL) 150 MG TABLET    Take 150 mg by mouth every morning.    LAMOTRIGINE (LAMICTAL) 200 MG TABLET    Take 200 mg by mouth. Take one in evening   METOPROLOL (TOPROL-XL) 50 MG 24 HR TABLET    Take 50 mg by mouth daily.     VALSARTAN (DIOVAN) 160 MG TABLET    Take 160 mg by mouth daily.     ZONISAMIDE (ZONEGRAN) 100 MG CAPSULE    Take 100 mg by mouth daily. Take one tablet in am; 2 in pm daily for seizure  Modified Medications   No medications on file  Discontinued Medications   CALCIUM CARBONATE (CALCIUM 500) 1250 MG TABLET    Take 3 tablets by mouth 2 (two) times daily.   CELECOXIB (CELEBREX) 200 MG CAPSULE    Take 200 mg by mouth 2 (two) times daily.   CHOLECALCIFEROL (  VITAMIN D) 1000 UNITS TABLET    Take 2,000 Units by mouth daily.     LORAZEPAM (ATIVAN) 1 MG TABLET    Take 1 mg by mouth 2 (two) times daily. For anxiety   OLOPATADINE (PATANOL) 0.1 % OPHTHALMIC SOLUTION    Place 1 drop into both eyes daily.     SIMVASTATIN (ZOCOR) 20 MG TABLET    Take 20 mg by mouth at bedtime.       Physical Exam: Physical Exam  Constitutional: She is oriented to person, place, and time. She appears well-developed and well-nourished. No distress.  HENT:  Head: Normocephalic and atraumatic.  Mouth/Throat: No  oropharyngeal exudate.  Eyes: Conjunctivae and EOM are normal. Pupils are equal, round, and reactive to light. No scleral icterus.  Neck: Normal range of motion. Neck supple. No JVD present. No thyromegaly present.  Cardiovascular: Normal rate, regular rhythm and normal heart sounds.   No murmur heard. Pulmonary/Chest: Effort normal and breath sounds normal. No respiratory distress. She has no wheezes. She has no rales.  Abdominal: Soft. Bowel sounds are normal. She exhibits no distension. There is no tenderness.  Musculoskeletal: Normal range of motion. She exhibits edema (trace LLE). She exhibits no tenderness.  Lymphadenopathy:    She has no cervical adenopathy.  Neurological: She is alert and oriented to person, place, and time. She displays normal reflexes. No cranial nerve deficit. She exhibits normal muscle tone. Coordination normal.  Skin: She is not diaphoretic.  Left medial thigh surgical scar  Psychiatric: Her mood appears anxious. Her affect is blunt, labile and inappropriate. Her affect is not angry. Her speech is rapid and/or pressured. Her speech is not delayed, not tangential and not slurred. She is hyperactive. She is not agitated, not aggressive, not slowed, not withdrawn, not actively hallucinating and not combative. Thought content is not paranoid and not delusional. Cognition and memory are impaired. She expresses impulsivity and inappropriate judgment. She does not exhibit a depressed mood. She is communicative. She exhibits abnormal recent memory. She exhibits normal remote memory. She is attentive.     Filed Vitals:   02/03/13 1552  BP: 188/84  Pulse: 76  Height: 5\' 4"  (1.626 m)  Weight: 129 lb (58.514 kg)      Labs reviewed: Basic Metabolic Panel: No results found for this basename: NA, K, CL, CO2, GLUCOSE, BUN, CREATININE, CALCIUM, MG, PHOS, TSH,  in the last 8760 hours  Liver Function Tests: No results found for this basename: AST, ALT, ALKPHOS, BILITOT,  PROT, ALBUMIN,  in the last 8760 hours  CBC: No results found for this basename: WBC, NEUTROABS, HGB, HCT, MCV, PLT,  in the last 8760 hours  Anemia Panel: No results found for this basename: IRON, FOLATE, VITAMINB12,  in the last 8760 hours  Significant Diagnostic Results:     Assessment/Plan GERD (gastroesophageal reflux disease) Flares up, took herself off Nexium, desires to resume Nexium 40mg  daily  Hypertension Elevated SBP 188/84 today, she stated she has not blood pressure meds for a week-run out of prescription. She had her blood pressure medications before her appointment. She denied HA, lightheadedness, dizziness, chest pain/pressure, palpitation, or SOB. The patient was instructed to take extra dose of Metoprolol at hs if her SBP>160 and SBP>80. Check Bp daily, return in 2weeks.   Seizure No seizure activities, takes Zonisamide 100mg  I in am, II in pm, Lamictal 150mg  I am and II pm.   COPD (chronic obstructive pulmonary disease) Chronic cough, no plan of smoke cessation.  Family/ staff Communication: monitor BP   Goals of care: [place]   Labs/tests ordered none

## 2013-02-10 ENCOUNTER — Other Ambulatory Visit: Payer: Self-pay | Admitting: Internal Medicine

## 2013-02-12 ENCOUNTER — Encounter: Payer: Self-pay | Admitting: Neurology

## 2013-02-12 DIAGNOSIS — G40309 Generalized idiopathic epilepsy and epileptic syndromes, not intractable, without status epilepticus: Secondary | ICD-10-CM | POA: Insufficient documentation

## 2013-02-12 DIAGNOSIS — M869 Osteomyelitis, unspecified: Secondary | ICD-10-CM

## 2013-02-12 DIAGNOSIS — E785 Hyperlipidemia, unspecified: Secondary | ICD-10-CM | POA: Insufficient documentation

## 2013-02-12 DIAGNOSIS — R000 Tachycardia, unspecified: Secondary | ICD-10-CM | POA: Insufficient documentation

## 2013-02-12 DIAGNOSIS — R410 Disorientation, unspecified: Secondary | ICD-10-CM

## 2013-02-12 DIAGNOSIS — C801 Malignant (primary) neoplasm, unspecified: Secondary | ICD-10-CM | POA: Insufficient documentation

## 2013-02-12 DIAGNOSIS — F418 Other specified anxiety disorders: Secondary | ICD-10-CM | POA: Insufficient documentation

## 2013-02-12 DIAGNOSIS — F1010 Alcohol abuse, uncomplicated: Secondary | ICD-10-CM | POA: Insufficient documentation

## 2013-02-12 DIAGNOSIS — F419 Anxiety disorder, unspecified: Secondary | ICD-10-CM

## 2013-02-12 DIAGNOSIS — I739 Peripheral vascular disease, unspecified: Secondary | ICD-10-CM | POA: Insufficient documentation

## 2013-02-12 DIAGNOSIS — R569 Unspecified convulsions: Secondary | ICD-10-CM | POA: Insufficient documentation

## 2013-02-14 ENCOUNTER — Ambulatory Visit (INDEPENDENT_AMBULATORY_CARE_PROVIDER_SITE_OTHER): Payer: Medicare Other | Admitting: Neurology

## 2013-02-14 ENCOUNTER — Encounter: Payer: Self-pay | Admitting: Neurology

## 2013-02-14 VITALS — BP 132/74 | HR 68 | Ht 64.0 in | Wt 131.0 lb

## 2013-02-14 DIAGNOSIS — R569 Unspecified convulsions: Secondary | ICD-10-CM
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
## 2013-02-17 ENCOUNTER — Encounter: Payer: Self-pay | Admitting: Nurse Practitioner

## 2013-02-17 ENCOUNTER — Non-Acute Institutional Stay: Payer: Medicare Other | Admitting: Nurse Practitioner

## 2013-02-17 VITALS — BP 132/68 | HR 68 | Ht 64.0 in | Wt 132.0 lb

## 2013-02-17 DIAGNOSIS — F411 Generalized anxiety disorder: Secondary | ICD-10-CM

## 2013-02-17 DIAGNOSIS — F419 Anxiety disorder, unspecified: Secondary | ICD-10-CM

## 2013-02-17 DIAGNOSIS — K219 Gastro-esophageal reflux disease without esophagitis: Secondary | ICD-10-CM

## 2013-02-17 DIAGNOSIS — R569 Unspecified convulsions: Secondary | ICD-10-CM

## 2013-02-17 DIAGNOSIS — J449 Chronic obstructive pulmonary disease, unspecified: Secondary | ICD-10-CM

## 2013-02-17 DIAGNOSIS — I10 Essential (primary) hypertension: Secondary | ICD-10-CM

## 2013-02-17 NOTE — Assessment & Plan Note (Addendum)
Chronic cough, no plan of smoke cessation. She does pursed lip breathing exercise

## 2013-02-17 NOTE — Assessment & Plan Note (Signed)
Off anxiolytic agent. Her anxiety is escalated presently.

## 2013-02-17 NOTE — Assessment & Plan Note (Signed)
No seizure activities, takes Zonisamide 100mg  I in am, II in pm, Lamictal 150mg  am and 200 pm.

## 2013-02-17 NOTE — Assessment & Plan Note (Signed)
No seizure activities, takes Zonisamide 100mg I in am, II in pm, Lamictal 150mg I am and II pm. Saw Neurology 02/14/13--no medication changes.  

## 2013-02-17 NOTE — Progress Notes (Signed)
Patient ID: Jessica [name], female   DOB: 1932-01-22, 79 y.o.   MRN: [id number]  Allergies  Allergen Reactions  . Aspirin Nausea And Vomiting  . Effexor (Venlafaxine Hcl)     Seizure  . Venlafaxine Other (See Comments)    seizures  . Penicillins Rash  . Zithromax (Azithromycin) Rash    Chief Complaint  Patient presents with  . Medical Managment of Chronic Issues    blood pressure     HPI: Patient is a 79 y.o. female seen in the clinic at [facility] today for blood pressure, anxiety Problem List Items Addressed This Visit   Hypertension (Chronic)     Normalized 132/68 today, takes Metoprolol 50mg  and Valsartan 160mg .       COPD (chronic obstructive pulmonary disease) - Primary     Chronic cough, no plan of smoke cessation. She does pursed lip breathing exercise      Seizure     No seizure activities, takes Zonisamide 100mg  I in am, II in pm, Lamictal 150mg  am and 200 pm.       GERD (gastroesophageal reflux disease)     Stabilized with resumed Nexium 40mg  daily      Seizures     No seizure activities, takes Zonisamide 100mg  I in am, II in pm, Lamictal 150mg  I am and II pm. Saw Neurology 02/14/13--no medication changes.       Anxiety     Off anxiolytic agent. Her anxiety is escalated presently.        Review of Systems:  Review of Systems  Constitutional: Positive for weight loss. Negative for fever, chills, malaise/fatigue and diaphoresis.  HENT: Negative for hearing loss, congestion, sore throat and neck pain.   Eyes: Negative for pain, discharge and redness.  Respiratory: Positive for cough and sputum production. Negative for shortness of breath and wheezing.   Cardiovascular: Positive for leg swelling (trace in LLE). Negative for chest pain, palpitations, orthopnea and claudication.  Gastrointestinal: Positive for heartburn. Negative for nausea, vomiting, abdominal pain, diarrhea, constipation and blood in stool.  Genitourinary: Positive for  frequency. Negative for dysuria, urgency, hematuria and flank pain.  Musculoskeletal: Negative for myalgias, back pain, joint pain and falls.  Skin: Negative for itching and rash.  Neurological: Negative for dizziness, tingling, tremors, sensory change, speech change, focal weakness, seizures, weakness and headaches.  Endo/Heme/Allergies: Negative for environmental allergies and polydipsia. Does not bruise/bleed easily.  Psychiatric/Behavioral: Positive for memory loss. Negative for depression, hallucinations and substance abuse. The patient is nervous/anxious. The patient does not have insomnia.      Past Medical History  Diagnosis Date  . Seizures   . GERD (gastroesophageal reflux disease)   . Peripheral vascular disease   . Anxiety   . Hyperlipidemia   . Hypertension   . Embolism - blood clot 1996    left breast  . Bone infection of left hand 12/22/2008    secondary to injury  . Hemorrhoids   . Cancer     colon cancer-hx of chemo  . Melanoma     metastatic  . Edema 12/03/2011  . Chronic airway obstruction, not elsewhere classified 10/01/2011  . Malignant neoplasm of stomach, unspecified site 11/11/2010  . Major depressive disorder, single episode, unspecified 11/11/2010  . Alcohol abuse 11/11/2010  . Atrial fibrillation 11/11/2010  . Symptomatic menopausal or female climacteric states 11/11/2010  . Insomnia, unspecified 11/11/2010  . Dizziness and giddiness 04/24/2005  . Tobacco use disorder   . Confusion   . COPD (  chronic obstructive pulmonary disease)   . Alcohol abuse, unspecified   . Tachycardia, unspecified   . Generalized convulsive epilepsy without mention of intractable epilepsy    Past Surgical History  Procedure Laterality Date  . C-sections  1957, 1959, 1961    x3  . Abdominal hysterectomy  1963  . Colon resection  1995  . Vocal cord injection  1996, 1997  . Breast biopsies      x2 on left, x 1 on right  . Hemorrhoid surgery    . Porta cath placement  1995  .  Porta cath removal  1996  . Heart ablation  1997  . Bladder repair    . Left femoral artery stent  05/07/2004  . Melanoma removed from left leg  09/06/2004  . Melanoma removed from left groin  09/2004  . Skin graft to left leg  12/05/2004  . Orif left humerus fracture  03/27/2005  . Laser surgery to right eye  10/04/2007  . Removal of tumor in left knee  11/15/2010   Social History:   reports that she has been smoking Cigarettes.  She has a 60 pack-year smoking history. She has never used smokeless tobacco. She reports that she does not drink alcohol or use illicit drugs.  History reviewed. No pertinent family history.  Medications: Patient's Medications  New Prescriptions   No medications on file  Previous Medications   ESTRADIOL (VIVELLE-DOT) 0.05 MG/24HR    Place 1 patch onto the skin once a week.     LAMOTRIGINE (LAMICTAL) 150 MG TABLET    Take 150 mg by mouth every morning.    LAMOTRIGINE (LAMICTAL) 200 MG TABLET    Take 200 mg by mouth. Take one in evening   METOPROLOL (TOPROL-XL) 50 MG 24 HR TABLET    Take 50 mg by mouth daily.     NEXIUM 40 MG CAPSULE    TAKE ONE CAPSULE BY MOUTH DAILY   SIMVASTATIN (ZOCOR) 20 MG TABLET    Take 20 mg by mouth daily.   VALSARTAN (DIOVAN) 160 MG TABLET    Take 160 mg by mouth daily.     ZONISAMIDE (ZONEGRAN) 100 MG CAPSULE    Take 100 mg by mouth daily. Take one tablet in am; 2 in pm daily for seizure  Modified Medications   No medications on file  Discontinued Medications   No medications on file     Physical Exam: Physical Exam  Constitutional: She is oriented to person, place, and time. She appears well-developed and well-nourished. No distress.  HENT:  Head: Normocephalic and atraumatic.  Mouth/Throat: No oropharyngeal exudate.  Eyes: Conjunctivae and EOM are normal. Pupils are equal, round, and reactive to light. No scleral icterus.  Neck: Normal range of motion. Neck supple. No JVD present. No thyromegaly present.  Cardiovascular: Normal  rate, regular rhythm and normal heart sounds.   No murmur heard. Pulmonary/Chest: Effort normal and breath sounds normal. No respiratory distress. She has no wheezes. She has no rales.  Abdominal: Soft. Bowel sounds are normal. She exhibits no distension. There is no tenderness.  Musculoskeletal: Normal range of motion. She exhibits edema (trace LLE). She exhibits no tenderness.  Lymphadenopathy:    She has no cervical adenopathy.  Neurological: She is alert and oriented to person, place, and time. She displays normal reflexes. No cranial nerve deficit. She exhibits normal muscle tone. Coordination normal.  Skin: She is not diaphoretic.  Left medial thigh surgical scar  Psychiatric: Her mood appears anxious. Her  affect is blunt, labile and inappropriate. Her affect is not angry. Her speech is rapid and/or pressured. Her speech is not delayed, not tangential and not slurred. She is hyperactive. She is not agitated, not aggressive, not slowed, not withdrawn, not actively hallucinating and not combative. Thought content is not paranoid and not delusional. Cognition and memory are impaired. She expresses impulsivity and inappropriate judgment. She does not exhibit a depressed mood. She is communicative. She exhibits abnormal recent memory. She exhibits normal remote memory. She is attentive.    Filed Vitals:   02/17/13 1419  BP: 132/68  Pulse: 68  Height: 5\' 4"  (1.626 m)  Weight: 132 lb (59.875 kg)      Labs reviewed: Basic Metabolic Panel: No results found for this basename: NA, K, CL, CO2, GLUCOSE, BUN, CREATININE, CALCIUM, MG, PHOS, TSH,  in the last 8760 hours Liver Function Tests: No results found for this basename: AST, ALT, ALKPHOS, BILITOT, PROT, ALBUMIN,  in the last 8760 hours No results found for this basename: LIPASE, AMYLASE,  in the last 8760 hours No results found for this basename: AMMONIA,  in the last 8760 hours CBC: No results found for this basename: WBC, NEUTROABS,  HGB, HCT, MCV, PLT,  in the last 8760 hours Lipid Panel: No results found for this basename: CHOL, HDL, LDLCALC, TRIG, CHOLHDL, LDLDIRECT,  in the last 8760 hours Anemia Panel: No results found for this basename: FOLATE, IRON, VITAMINB12,  in the last 8760 hours  Past Procedures:     Assessment/Plan COPD (chronic obstructive pulmonary disease) Chronic cough, no plan of smoke cessation. She does pursed lip breathing exercise    Hypertension Normalized 132/68 today, takes Metoprolol 50mg  and Valsartan 160mg .     Seizure No seizure activities, takes Zonisamide 100mg  I in am, II in pm, Lamictal 150mg  am and 200 pm.     GERD (gastroesophageal reflux disease) Stabilized with resumed Nexium 40mg  daily    Anxiety Off anxiolytic agent. Her anxiety is escalated presently.   Seizures No seizure activities, takes Zonisamide 100mg  I in am, II in pm, Lamictal 150mg  I am and II pm. Saw Neurology 02/14/13--no medication changes.       Family/ Staff Communication:  Observe the patient  Goals of Care: IL  Labs/tests ordered: none

## 2013-02-17 NOTE — Assessment & Plan Note (Signed)
Stabilized with resumed Nexium 40mg  daily

## 2013-02-17 NOTE — Assessment & Plan Note (Signed)
Normalized 132/68 today, takes Metoprolol 50mg  and Valsartan 160mg .

## 2013-04-14 ENCOUNTER — Other Ambulatory Visit: Payer: Self-pay | Admitting: Nurse Practitioner

## 2013-04-14 ENCOUNTER — Other Ambulatory Visit: Payer: Self-pay | Admitting: Geriatric Medicine

## 2013-05-16 ENCOUNTER — Ambulatory Visit: Payer: Self-pay | Admitting: Neurology

## 2013-06-08 ENCOUNTER — Other Ambulatory Visit: Payer: Self-pay

## 2013-06-08 MED ORDER — ZONISAMIDE 100 MG PO CAPS: ORAL_CAPSULE | ORAL | Status: DC

## 2013-06-23 ENCOUNTER — Other Ambulatory Visit: Payer: Self-pay

## 2013-06-23 MED ORDER — LAMOTRIGINE 150 MG PO TABS: 150.0000 mg | ORAL_TABLET | Freq: Every morning | ORAL | Status: DC

## 2013-06-23 MED ORDER — LAMOTRIGINE 200 MG PO TABS: 200.0000 mg | ORAL_TABLET | Freq: Every evening | ORAL | Status: DC

## 2013-07-01 ENCOUNTER — Telehealth: Payer: Self-pay | Admitting: Neurology

## 2013-07-01 MED ORDER — LAMOTRIGINE 200 MG PO TABS: 200.0000 mg | ORAL_TABLET | Freq: Every evening | ORAL | Status: DC

## 2013-07-01 MED ORDER — ZONISAMIDE 100 MG PO CAPS: ORAL_CAPSULE | ORAL | Status: DC

## 2013-07-01 MED ORDER — LAMOTRIGINE 150 MG PO TABS: 150.0000 mg | ORAL_TABLET | Freq: Every morning | ORAL | Status: DC

## 2013-07-01 NOTE — Telephone Encounter (Signed)
Rx sent 

## 2013-08-17 ENCOUNTER — Encounter: Payer: Self-pay | Admitting: Neurology

## 2013-08-17 ENCOUNTER — Ambulatory Visit (INDEPENDENT_AMBULATORY_CARE_PROVIDER_SITE_OTHER): Payer: Medicare Other | Admitting: Neurology

## 2013-08-17 VITALS — BP 157/74 | HR 71 | Ht 64.0 in | Wt 132.0 lb

## 2013-08-17 DIAGNOSIS — F29 Unspecified psychosis not due to a substance or known physiological condition: Secondary | ICD-10-CM

## 2013-08-17 DIAGNOSIS — I10 Essential (primary) hypertension: Secondary | ICD-10-CM

## 2013-08-17 DIAGNOSIS — I739 Peripheral vascular disease, unspecified: Secondary | ICD-10-CM

## 2013-08-17 DIAGNOSIS — R410 Disorientation, unspecified: Secondary | ICD-10-CM

## 2013-08-17 DIAGNOSIS — G40309 Generalized idiopathic epilepsy and epileptic syndromes, not intractable, without status epilepticus: Secondary | ICD-10-CM

## 2013-08-17 DIAGNOSIS — C801 Malignant (primary) neoplasm, unspecified: Secondary | ICD-10-CM

## 2013-08-17 MED ORDER — LAMOTRIGINE 200 MG PO TABS: 200.0000 mg | ORAL_TABLET | Freq: Every evening | ORAL | Status: DC

## 2013-08-17 MED ORDER — LAMOTRIGINE 150 MG PO TABS: 150.0000 mg | ORAL_TABLET | Freq: Every morning | ORAL | Status: DC

## 2013-08-17 MED ORDER — ZONISAMIDE 100 MG PO CAPS: ORAL_CAPSULE | ORAL | Status: DC
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
## 2013-08-18 ENCOUNTER — Non-Acute Institutional Stay: Payer: Medicare Other | Admitting: Nurse Practitioner

## 2013-08-18 ENCOUNTER — Encounter: Payer: Self-pay | Admitting: Nurse Practitioner

## 2013-08-18 VITALS — BP 124/66 | HR 96 | Ht 64.0 in | Wt 134.0 lb

## 2013-08-18 DIAGNOSIS — Z720 Tobacco use: Secondary | ICD-10-CM

## 2013-08-18 DIAGNOSIS — F411 Generalized anxiety disorder: Secondary | ICD-10-CM

## 2013-08-18 DIAGNOSIS — K219 Gastro-esophageal reflux disease without esophagitis: Secondary | ICD-10-CM

## 2013-08-18 DIAGNOSIS — F17200 Nicotine dependence, unspecified, uncomplicated: Secondary | ICD-10-CM

## 2013-08-18 DIAGNOSIS — I10 Essential (primary) hypertension: Secondary | ICD-10-CM

## 2013-08-18 DIAGNOSIS — J449 Chronic obstructive pulmonary disease, unspecified: Secondary | ICD-10-CM

## 2013-08-18 DIAGNOSIS — G40309 Generalized idiopathic epilepsy and epileptic syndromes, not intractable, without status epilepticus: Secondary | ICD-10-CM

## 2013-08-18 DIAGNOSIS — F419 Anxiety disorder, unspecified: Secondary | ICD-10-CM

## 2013-08-18 NOTE — Assessment & Plan Note (Signed)
Off anxiolytic agent. Her anxiety level is high, but she stated she is able to sleep/rest at night.

## 2013-08-18 NOTE — Assessment & Plan Note (Signed)
controlled, takes Metoprolol 50mg and Valsartan 160mg.    

## 2013-08-18 NOTE — Assessment & Plan Note (Signed)
Stable

## 2013-08-18 NOTE — Assessment & Plan Note (Signed)
No plan for cessation.

## 2013-08-18 NOTE — Progress Notes (Signed)
Patient ID: Jessica [name], female   DOB: 22-May-1932, 79 y.o.   MRN: [id number]   Allergies  Allergen Reactions  . Aspirin Nausea And Vomiting  . Effexor [Venlafaxine Hcl]     Seizure  . Venlafaxine Other (See Comments)    seizures  . Penicillins Rash  . Zithromax [Azithromycin] Rash    Chief Complaint  Patient presents with  . Medical Managment of Chronic Issues    HPI: Patient is a 79 y.o. female seen in the clinic at [facility] today for blood pressure, anxiety, and other chronic medical conditions.  Problem List Items Addressed This Visit   Anxiety     Off anxiolytic agent. Her anxiety level is high, but she stated she is able to sleep/rest at night.       COPD (chronic obstructive pulmonary disease)     Stable.     Generalized convulsive epilepsy without mention of intractable epilepsy     No seizure activities since Jan 2013, takes Zonisamide 100mg  I in am, II in pm, Lamictal 150mg  I am and II pm. Saw Neurology 02/14/13--no medication changes.         GERD (gastroesophageal reflux disease)     Stabilized with resumed Nexium 40mg  daily        Hypertension - Primary (Chronic)     controlled, takes Metoprolol 50mg  and Valsartan 160mg .         Tobacco abuse     No plan for cessation.        Review of Systems:  Review of Systems  Constitutional: Positive for weight loss. Negative for fever, chills, malaise/fatigue and diaphoresis.  HENT: Negative for congestion, hearing loss and sore throat.   Eyes: Negative for pain, discharge and redness.  Respiratory: Positive for cough and sputum production. Negative for shortness of breath and wheezing.   Cardiovascular: Positive for leg swelling (trace in LLE). Negative for chest pain, palpitations, orthopnea and claudication.  Gastrointestinal: Positive for heartburn. Negative for nausea, vomiting, abdominal pain, diarrhea, constipation and blood in stool.  Genitourinary: Positive for frequency.  Negative for dysuria, urgency, hematuria and flank pain.  Musculoskeletal: Negative for back pain, falls, joint pain, myalgias and neck pain.  Skin: Negative for itching and rash.  Neurological: Negative for dizziness, tingling, tremors, sensory change, speech change, focal weakness, seizures, weakness and headaches.  Endo/Heme/Allergies: Negative for environmental allergies and polydipsia. Does not bruise/bleed easily.  Psychiatric/Behavioral: Positive for memory loss. Negative for depression, hallucinations and substance abuse. The patient is nervous/anxious. The patient does not have insomnia.      Past Medical History  Diagnosis Date  . Seizures   . GERD (gastroesophageal reflux disease)   . Peripheral vascular disease   . Anxiety   . Hyperlipidemia   . Hypertension   . Embolism - blood clot 1996    left breast  . Bone infection of left hand 12/22/2008    secondary to injury  . Hemorrhoids   . Cancer     colon cancer-hx of chemo  . Melanoma     metastatic  . Edema 12/03/2011  . Chronic airway obstruction, not elsewhere classified 10/01/2011  . Malignant neoplasm of stomach, unspecified site 11/11/2010  . Major depressive disorder, single episode, unspecified 11/11/2010  . Alcohol abuse 11/11/2010  . Atrial fibrillation 11/11/2010  . Symptomatic menopausal or female climacteric states 11/11/2010  . Insomnia, unspecified 11/11/2010  . Dizziness and giddiness 04/24/2005  . Tobacco use disorder   . Confusion   . COPD (chronic  obstructive pulmonary disease)   . Alcohol abuse, unspecified   . Tachycardia, unspecified   . Generalized convulsive epilepsy without mention of intractable epilepsy    Past Surgical History  Procedure Laterality Date  . C-sections  1957, 1959, 1961    x3  . Abdominal hysterectomy  1963  . Colon resection  1995  . Vocal cord injection  1996, 1997  . Breast biopsies      x2 on left, x 1 on right  . Hemorrhoid surgery    . Porta cath placement  1995  .  Porta cath removal  1996  . Heart ablation  1997  . Bladder repair    . Left femoral artery stent  05/07/2004  . Melanoma removed from left leg  09/06/2004  . Melanoma removed from left groin  09/2004  . Skin graft to left leg  12/05/2004  . Orif left humerus fracture  03/27/2005  . Laser surgery to right eye  10/04/2007  . Removal of tumor in left knee  11/15/2010  . Colonoscopy  10/21/2012    Dr. Dulce Sellar diverticulosis, sessile polyps biopsy    Social History:   reports that she has been smoking Cigarettes.  She has a 30 pack-year smoking history. She has never used smokeless tobacco. She reports that she does not drink alcohol or use illicit drugs.  History reviewed. No pertinent family history.  Medications: Patient's Medications  New Prescriptions   No medications on file  Previous Medications   ESTRADIOL (VIVELLE-DOT) 0.05 MG/24HR    Place 1 patch onto the skin once a week.     LAMOTRIGINE (LAMICTAL) 150 MG TABLET    Take 1 tablet (150 mg total) by mouth every morning.   LAMOTRIGINE (LAMICTAL) 200 MG TABLET    Take 1 tablet (200 mg total) by mouth every evening.   METOPROLOL (TOPROL-XL) 50 MG 24 HR TABLET    Take 50 mg by mouth daily.     NEXIUM 40 MG CAPSULE    TAKE ONE CAPSULE BY MOUTH DAILY   SIMVASTATIN (ZOCOR) 20 MG TABLET    Take 20 mg by mouth daily.   VALSARTAN (DIOVAN) 160 MG TABLET    Take 160 mg by mouth daily.     ZONISAMIDE (ZONEGRAN) 100 MG CAPSULE    Take one tablet in am and 2 in pm daily for seizure  Modified Medications   No medications on file  Discontinued Medications   No medications on file     Physical Exam: Physical Exam  Constitutional: She is oriented to person, place, and time. She appears well-developed and well-nourished. No distress.  HENT:  Head: Normocephalic and atraumatic.  Mouth/Throat: No oropharyngeal exudate.  Eyes: Conjunctivae and EOM are normal. Pupils are equal, round, and reactive to light. No scleral icterus.  Neck: Normal range of  motion. Neck supple. No JVD present. No thyromegaly present.  Cardiovascular: Normal rate, regular rhythm and normal heart sounds.   No murmur heard. Pulmonary/Chest: Effort normal and breath sounds normal. No respiratory distress. She has no wheezes. She has no rales.  Abdominal: Soft. Bowel sounds are normal. She exhibits no distension. There is no tenderness.  Musculoskeletal: Normal range of motion. She exhibits edema (trace LLE). She exhibits no tenderness.  Lymphadenopathy:    She has no cervical adenopathy.  Neurological: She is alert and oriented to person, place, and time. She displays normal reflexes. No cranial nerve deficit. She exhibits normal muscle tone. Coordination normal.  Skin: She is not diaphoretic.  Left medial thigh surgical scar  Psychiatric: Her mood appears anxious. Her affect is blunt, labile and inappropriate. Her affect is not angry. Her speech is rapid and/or pressured. Her speech is not delayed, not tangential and not slurred. She is hyperactive. She is not agitated, not aggressive, not slowed, not withdrawn, not actively hallucinating and not combative. Thought content is not paranoid and not delusional. Cognition and memory are impaired. She expresses impulsivity and inappropriate judgment. She does not exhibit a depressed mood. She is communicative. She exhibits abnormal recent memory. She exhibits normal remote memory. She is attentive.    Filed Vitals:   08/18/13 1426  BP: 124/66  Pulse: 96  Height: 5\' 4"  (1.626 m)  Weight: 134 lb (60.782 kg)      Labs reviewed: dure CBC, CMP, TSH, Hgb A1c, lipids: the patient stated her neurologist has checked.   Past Procedures:  NA   Assessment/Plan Hypertension controlled, takes Metoprolol 50mg  and Valsartan 160mg .       GERD (gastroesophageal reflux disease) Stabilized with resumed Nexium 40mg  daily      Generalized convulsive epilepsy without mention of intractable epilepsy No seizure activities  since Jan 2013, takes Zonisamide 100mg  I in am, II in pm, Lamictal 150mg  I am and II pm. Saw Neurology 02/14/13--no medication changes.       Anxiety Off anxiolytic agent. Her anxiety level is high, but she stated she is able to sleep/rest at night.     COPD (chronic obstructive pulmonary disease) Stable.   Tobacco abuse No plan for cessation.     Family/ Staff Communication:  Observe the patient  Goals of Care: IL  Labs/tests ordered: none

## 2013-08-18 NOTE — Assessment & Plan Note (Signed)
Stabilized with resumed Nexium 40mg  daily

## 2013-08-18 NOTE — Assessment & Plan Note (Signed)
No seizure activities since Jan 2013, takes Zonisamide 100mg I in am, II in pm, Lamictal 150mg I am and II pm. Saw Neurology 02/14/13--no medication changes.   

## 2013-08-22 ENCOUNTER — Telehealth: Payer: Self-pay | Admitting: *Deleted

## 2013-08-22 NOTE — Telephone Encounter (Signed)
I have called, fail to reach her, Lupita Leash, please call her, she had  current dose of lamictal 150/200mg , zonisamide 100/200mg , refilled her medications  2. RTC in 6-9 months

## 2013-08-23 MED ORDER — LAMICTAL 200 MG PO TABS: 200.0000 mg | ORAL_TABLET | Freq: Two times a day (BID) | ORAL | Status: DC

## 2013-08-23 NOTE — Telephone Encounter (Signed)
I called her, she had seizure on Friday night, 08/19/2013, she did not feel good, and she had generalized seizure afterward,  She bite her lips, I have advised her to increase lamictal to 200mg  bid.  Keep follow up

## 2013-09-26 ENCOUNTER — Other Ambulatory Visit: Payer: Self-pay | Admitting: Neurology

## 2013-09-26 MED ORDER — LAMICTAL 200 MG PO TABS: 200.0000 mg | ORAL_TABLET | Freq: Two times a day (BID) | ORAL | Status: DC

## 2013-09-26 NOTE — Telephone Encounter (Signed)
I spoke with the patient.  She simply wants a written Rx for Lamictal set to her so she can mail it to [facility] along with her application.  Her dose was increased to 200mg  BID per Dr Zannie Cove last phone note.  Dr Terrace Arabia is out of the office, forwarding request to Dr Vickey Huger, [facility]

## 2013-09-26 NOTE — Telephone Encounter (Signed)
Patient called back requesting the status of this request.

## 2013-09-26 NOTE — Telephone Encounter (Signed)
Patient called requesting patient assistance and refill for Lamictal. Patient wants script written for 200 mg daily for 3 months and wants 3 refills. She wants patient assistance mailed to her address.

## 2013-10-04 ENCOUNTER — Telehealth: Payer: Self-pay | Admitting: *Deleted

## 2013-10-05 ENCOUNTER — Other Ambulatory Visit: Payer: Self-pay

## 2013-10-05 MED ORDER — LAMICTAL 200 MG PO TABS: 200.0000 mg | ORAL_TABLET | Freq: Two times a day (BID) | ORAL | Status: DC

## 2013-10-05 NOTE — Telephone Encounter (Signed)
I spoke with the patient.  She is applying for PAP and wanted to be sure he Rx was written for 3 months plus 3 refills.  Advised patient this is how Rx was written.

## 2013-10-10 NOTE — Telephone Encounter (Signed)
Called patient to inform her that I was mailing her Rx out today and I advised the patient that if she has any other problems, questions or concerns to call the office. Patient verbalized understanding.

## 2013-10-25 ENCOUNTER — Telehealth: Payer: Self-pay | Admitting: Neurology

## 2013-10-25 ENCOUNTER — Other Ambulatory Visit: Payer: Self-pay | Admitting: Neurology

## 2013-10-25 MED ORDER — LAMICTAL 200 MG PO TABS: 200.0000 mg | ORAL_TABLET | Freq: Two times a day (BID) | ORAL | Status: DC

## 2013-10-25 NOTE — Telephone Encounter (Signed)
Patient gets her Lamictal via PAP.  She was mailed a written Rx to send to them with her application.  I called her back to see if she needs a refill at the local pharmacy.  Got no answer, no voicemail.  Will try again later.

## 2013-10-25 NOTE — Telephone Encounter (Signed)
Pt called needing a new prescription for her Lamictal.  The one she has has run out.  Please call

## 2013-10-25 NOTE — Telephone Encounter (Signed)
I spoke with the patient.  She would like the refills sent to [facility].  Says her PAP is delayed because she first has to meet her deductable before they will send meds.  Rx has been sent.   Per notes:  Marcial Pacas, MD at 08/23/2013 12:04 PM     Status: Signed        I called her, she had seizure on Friday night, 08/19/2013, she did not feel good, and she had generalized seizure afterward, She bite her lips, I have advised her to increase lamictal to 200mg  bid.

## 2013-10-26 ENCOUNTER — Ambulatory Visit: Payer: Self-pay | Admitting: Nurse Practitioner

## 2013-10-31 ENCOUNTER — Other Ambulatory Visit: Payer: Self-pay

## 2013-10-31 MED ORDER — LAMICTAL 200 MG PO TABS: 200.0000 mg | ORAL_TABLET | Freq: Two times a day (BID) | ORAL | Status: DC

## 2013-11-25 ENCOUNTER — Other Ambulatory Visit: Payer: Self-pay | Admitting: Neurology

## 2013-12-22 DIAGNOSIS — K219 Gastro-esophageal reflux disease without esophagitis: Secondary | ICD-10-CM | POA: Diagnosis not present

## 2013-12-22 DIAGNOSIS — I10 Essential (primary) hypertension: Secondary | ICD-10-CM | POA: Diagnosis not present

## 2013-12-22 DIAGNOSIS — G9001 Carotid sinus syncope: Secondary | ICD-10-CM | POA: Diagnosis not present

## 2013-12-22 DIAGNOSIS — M542 Cervicalgia: Secondary | ICD-10-CM | POA: Diagnosis not present

## 2013-12-22 DIAGNOSIS — E785 Hyperlipidemia, unspecified: Secondary | ICD-10-CM | POA: Diagnosis not present

## 2014-02-16 ENCOUNTER — Non-Acute Institutional Stay: Payer: Medicare Other | Admitting: Nurse Practitioner

## 2014-02-16 ENCOUNTER — Encounter: Payer: Self-pay | Admitting: Nurse Practitioner

## 2014-02-16 ENCOUNTER — Other Ambulatory Visit: Payer: Self-pay | Admitting: Nurse Practitioner

## 2014-02-16 VITALS — BP 124/64 | HR 60 | Wt 128.0 lb

## 2014-02-16 DIAGNOSIS — F411 Generalized anxiety disorder: Secondary | ICD-10-CM | POA: Diagnosis not present

## 2014-02-16 DIAGNOSIS — M19049 Primary osteoarthritis, unspecified hand: Secondary | ICD-10-CM | POA: Insufficient documentation

## 2014-02-16 DIAGNOSIS — F419 Anxiety disorder, unspecified: Secondary | ICD-10-CM

## 2014-02-16 DIAGNOSIS — K219 Gastro-esophageal reflux disease without esophagitis: Secondary | ICD-10-CM

## 2014-02-16 DIAGNOSIS — R569 Unspecified convulsions: Secondary | ICD-10-CM | POA: Diagnosis not present

## 2014-02-16 DIAGNOSIS — I10 Essential (primary) hypertension: Secondary | ICD-10-CM | POA: Diagnosis not present

## 2014-02-16 NOTE — Assessment & Plan Note (Signed)
controlled, takes Metoprolol 50mg  and Valsartan 160mg .

## 2014-02-16 NOTE — Assessment & Plan Note (Signed)
Stabilized with resumed Nexium daily

## 2014-02-16 NOTE — Assessment & Plan Note (Addendum)
Stabilized. Off Nexium   

## 2014-02-16 NOTE — Assessment & Plan Note (Signed)
Off anxiolytic agent. Her anxiety level is high, but she stated she is able to sleep/rest at nigh    

## 2014-02-16 NOTE — Assessment & Plan Note (Signed)
Mainly R+L 3rd fingers: OTC cream and warm compress-observe.

## 2014-02-16 NOTE — Assessment & Plan Note (Signed)
last seizure activity was 07/2013, takes Zonisamide 100mg  I in am, II in pm, Lamictal 150mg  I am and II pm. Saw Neurology 02/14/13--f/u Neurology yearly. Said her CBC, CMP, TSH, Zonisamide level will be checked there.

## 2014-02-16 NOTE — Assessment & Plan Note (Addendum)
controlled, takes Metoprolol 50mg  and Diovan 160mg .

## 2014-02-16 NOTE — Progress Notes (Signed)
Patient ID: Jessica [name], female   DOB: 05-26-1932, 80 y.o.   MRN: [id number]    Code: DNR  Allergies  Allergen Reactions  . Aspirin Nausea And Vomiting  . Effexor [Venlafaxine Hcl]     Seizure  . Venlafaxine Other (See Comments)    seizures  . Penicillins Rash  . Zithromax [Azithromycin] Rash    Chief Complaint  Patient presents with  . Medical Management of Chronic Issues    blood pressure, anxiety, COPD    HPI: Patient is a 80 y.o. female seen in the clinic at [facility] today for blood pressure, anxiety, and other chronic medical conditions.  Problem List Items Addressed This Visit   Anxiety - Primary      Off anxiolytic agent. Her anxiety level is high, but she stated she is able to sleep/rest at nigh       GERD (gastroesophageal reflux disease)     Stabilized. Off Nexium     Hypertension (Chronic)     controlled, takes Metoprolol 50mg  and Diovan 160mg .      Osteoarthritis of finger     Mainly R+L 3rd fingers: OTC cream and warm compress-observe.     Seizures     last seizure activity was 07/2013, takes Zonisamide 100mg  I in am, II in pm, Lamictal 150mg  I am and II pm. Saw Neurology 02/14/13--f/u Neurology yearly. Said her CBC, CMP, TSH, Zonisamide level will be checked there.         Review of Systems:  Review of Systems  Constitutional: Positive for weight loss. Negative for fever, chills, malaise/fatigue and diaphoresis.  HENT: Negative for congestion, hearing loss and sore throat.   Eyes: Negative for pain, discharge and redness.  Respiratory: Positive for cough and sputum production. Negative for shortness of breath and wheezing.   Cardiovascular: Positive for leg swelling (trace in LLE). Negative for chest pain, palpitations, orthopnea and claudication.  Gastrointestinal: Positive for heartburn. Negative for nausea, vomiting, abdominal pain, diarrhea, constipation and blood in stool.  Genitourinary: Positive for frequency. Negative  for dysuria, urgency, hematuria and flank pain.  Musculoskeletal: Negative for back pain, falls, joint pain, myalgias and neck pain.  Skin: Negative for itching and rash.  Neurological: Negative for dizziness, tingling, tremors, sensory change, speech change, focal weakness, seizures, weakness and headaches.  Endo/Heme/Allergies: Negative for environmental allergies and polydipsia. Does not bruise/bleed easily.  Psychiatric/Behavioral: Positive for memory loss. Negative for depression, hallucinations and substance abuse. The patient is nervous/anxious. The patient does not have insomnia.      Past Medical History  Diagnosis Date  . Seizures   . GERD (gastroesophageal reflux disease)   . Peripheral vascular disease   . Anxiety   . Hyperlipidemia   . Hypertension   . Embolism - blood clot 1996    left breast  . Bone infection of left hand 12/22/2008    secondary to injury  . Hemorrhoids   . Cancer     colon cancer-hx of chemo  . Melanoma     metastatic  . Edema 12/03/2011  . Chronic airway obstruction, not elsewhere classified 10/01/2011  . Malignant neoplasm of stomach, unspecified site 11/11/2010  . Major depressive disorder, single episode, unspecified 11/11/2010  . Alcohol abuse 11/11/2010  . Atrial fibrillation 11/11/2010  . Symptomatic menopausal or female climacteric states 11/11/2010  . Insomnia, unspecified 11/11/2010  . Dizziness and giddiness 04/24/2005  . Tobacco use disorder   . Confusion   . COPD (chronic obstructive pulmonary disease)   .  Alcohol abuse, unspecified   . Tachycardia, unspecified   . Generalized convulsive epilepsy without mention of intractable epilepsy    Past Surgical History  Procedure Laterality Date  . [place]    x3  . Abdominal hysterectomy  1963  . Colon resection  1995  . Vocal cord injection  1996, 1997  . Breast biopsies      x2 on left, x 1 on right  . Hemorrhoid surgery    . Porta cath placement  1995  . Porta cath  removal  1996  . Heart ablation  1997  . Bladder repair    . Left femoral artery stent  05/07/2004  . Melanoma removed from left leg  09/06/2004  . Melanoma removed from left groin  09/2004  . Skin graft to left leg  12/05/2004  . Orif left humerus fracture  03/27/2005  . Laser surgery to right eye  10/04/2007  . Removal of tumor in left knee  11/15/2010  . Colonoscopy  10/21/2012    Dr. Paulita Fujita diverticulosis, sessile polyps biopsy    Social History:   reports that she has been smoking Cigarettes.  She has a 30 pack-year smoking history. She has never used smokeless tobacco. She reports that she does not drink alcohol or use illicit drugs.  History reviewed. No pertinent family history.  Medications: Patient's Medications  New Prescriptions   No medications on file  Previous Medications   LAMICTAL 200 MG TABLET    Take 1 tablet (200 mg total) by mouth 2 (two) times daily.   METOPROLOL (TOPROL-XL) 50 MG 24 HR TABLET    Take 50 mg by mouth daily.     VALSARTAN (DIOVAN) 160 MG TABLET    Take 160 mg by mouth daily.     ZONISAMIDE (ZONEGRAN) 100 MG CAPSULE    Take one tablet in am and 2 in pm daily for seizure  Modified Medications   No medications on file  Discontinued Medications   ESTRADIOL (VIVELLE-DOT) 0.05 MG/24HR    Place 1 patch onto the skin once a week.     NEXIUM 40 MG CAPSULE    TAKE ONE CAPSULE BY MOUTH DAILY   SIMVASTATIN (ZOCOR) 20 MG TABLET    Take 20 mg by mouth daily.   ZONISAMIDE (ZONEGRAN) 100 MG CAPSULE    TAKE ONE CAPSULE  IN THE MORNING  AND 2 CAPSULES  IN THE EVENING EACH DAY FOR SEIZURE     Physical Exam: Physical Exam  Constitutional: She is oriented to person, place, and time. She appears well-developed and well-nourished. No distress.  HENT:  Head: Normocephalic and atraumatic.  Mouth/Throat: No oropharyngeal exudate.  Eyes: Conjunctivae and EOM are normal. Pupils are equal, round, and reactive to light. No scleral icterus.  Neck: Normal range of motion. Neck  supple. No JVD present. No thyromegaly present.  Cardiovascular: Normal rate, regular rhythm and normal heart sounds.   No murmur heard. Pulmonary/Chest: Effort normal and breath sounds normal. No respiratory distress. She has no wheezes. She has no rales.  Abdominal: Soft. Bowel sounds are normal. She exhibits no distension. There is no tenderness.  Musculoskeletal: Normal range of motion. She exhibits edema (trace LLE). She exhibits no tenderness.  Lymphadenopathy:    She has no cervical adenopathy.  Neurological: She is alert and oriented to person, place, and time. She displays normal reflexes. No cranial nerve deficit. She exhibits normal muscle tone. Coordination normal.  Skin: She is not diaphoretic.  Left medial  thigh surgical scar  Psychiatric: Her mood appears anxious. Her affect is blunt, labile and inappropriate. Her affect is not angry. Her speech is rapid and/or pressured. Her speech is not delayed, not tangential and not slurred. She is hyperactive. She is not agitated, not aggressive, not slowed, not withdrawn, not actively hallucinating and not combative. Thought content is not paranoid and not delusional. Cognition and memory are impaired. She expresses impulsivity and inappropriate judgment. She does not exhibit a depressed mood. She is communicative. She exhibits abnormal recent memory. She exhibits normal remote memory. She is attentive.    Filed Vitals:   02/16/14 1434  BP: 124/64  Pulse: 60  Weight: 128 lb (58.06 kg)  SpO2: 93%      Labs reviewed:  CBC, CMP, TSH, Zonisamide level: the patient stated her neurology will check them.   Past Procedures:  NA   Assessment/Plan Anxiety Off anxiolytic agent. Her anxiety level is high, but she stated she is able to sleep/rest at nigh     Seizures last seizure activity was 07/2013, takes Zonisamide 100mg  I in am, II in pm, Lamictal 150mg  I am and II pm. Saw Neurology 02/14/13--f/u Neurology yearly. Said her CBC, CMP,  TSH, Zonisamide level will be checked there.    Hypertension controlled, takes Metoprolol 50mg  and Diovan 160mg .    GERD (gastroesophageal reflux disease) Stabilized. Off Nexium   Osteoarthritis of finger Mainly R+L 3rd fingers: OTC cream and warm compress-observe.     Family/ Staff Communication:  Observe the patient  Goals of Care: IL  Labs/tests ordered: none

## 2014-02-16 NOTE — Assessment & Plan Note (Signed)
No seizure activities, takes Zonisamide 100mg  I in am, II in pm, Lamictal 150mg  I am and II pm. Saw Neurology 02/14/13--no medication changes.

## 2014-03-02 ENCOUNTER — Encounter: Payer: Self-pay | Admitting: Nurse Practitioner

## 2014-03-25 ENCOUNTER — Emergency Department (HOSPITAL_COMMUNITY): Payer: Medicare Other

## 2014-03-25 ENCOUNTER — Emergency Department (HOSPITAL_COMMUNITY)
Admission: EM | Admit: 2014-03-25 | Discharge: 2014-03-25 | Disposition: A | Discharge status: Home / Self Care | Payer: Medicare Other | Attending: Emergency Medicine | Admitting: Emergency Medicine

## 2014-03-25 ENCOUNTER — Encounter (HOSPITAL_COMMUNITY): Payer: Self-pay | Admitting: Emergency Medicine

## 2014-03-25 DIAGNOSIS — W100XXA Fall (on)(from) escalator, initial encounter: Secondary | ICD-10-CM | POA: Diagnosis not present

## 2014-03-25 DIAGNOSIS — Z8659 Personal history of other mental and behavioral disorders: Secondary | ICD-10-CM | POA: Diagnosis not present

## 2014-03-25 DIAGNOSIS — M25519 Pain in unspecified shoulder: Secondary | ICD-10-CM | POA: Diagnosis not present

## 2014-03-25 DIAGNOSIS — Z8679 Personal history of other diseases of the circulatory system: Secondary | ICD-10-CM | POA: Diagnosis not present

## 2014-03-25 DIAGNOSIS — Z8742 Personal history of other diseases of the female genital tract: Secondary | ICD-10-CM | POA: Diagnosis not present

## 2014-03-25 DIAGNOSIS — I10 Essential (primary) hypertension: Secondary | ICD-10-CM | POA: Insufficient documentation

## 2014-03-25 DIAGNOSIS — G40309 Generalized idiopathic epilepsy and epileptic syndromes, not intractable, without status epilepticus: Secondary | ICD-10-CM | POA: Diagnosis not present

## 2014-03-25 DIAGNOSIS — Z85028 Personal history of other malignant neoplasm of stomach: Secondary | ICD-10-CM | POA: Insufficient documentation

## 2014-03-25 DIAGNOSIS — Z85820 Personal history of malignant melanoma of skin: Secondary | ICD-10-CM | POA: Diagnosis not present

## 2014-03-25 DIAGNOSIS — F17200 Nicotine dependence, unspecified, uncomplicated: Secondary | ICD-10-CM | POA: Diagnosis not present

## 2014-03-25 DIAGNOSIS — R569 Unspecified convulsions: Secondary | ICD-10-CM | POA: Diagnosis not present

## 2014-03-25 DIAGNOSIS — Z8709 Personal history of other diseases of the respiratory system: Secondary | ICD-10-CM | POA: Insufficient documentation

## 2014-03-25 DIAGNOSIS — J4489 Other specified chronic obstructive pulmonary disease: Secondary | ICD-10-CM | POA: Diagnosis not present

## 2014-03-25 DIAGNOSIS — Z8739 Personal history of other diseases of the musculoskeletal system and connective tissue: Secondary | ICD-10-CM | POA: Diagnosis not present

## 2014-03-25 DIAGNOSIS — S42213A Unspecified displaced fracture of surgical neck of unspecified humerus, initial encounter for closed fracture: Secondary | ICD-10-CM | POA: Diagnosis not present

## 2014-03-25 DIAGNOSIS — Z85038 Personal history of other malignant neoplasm of large intestine: Secondary | ICD-10-CM | POA: Diagnosis not present

## 2014-03-25 DIAGNOSIS — Y9389 Activity, other specified: Secondary | ICD-10-CM | POA: Diagnosis not present

## 2014-03-25 DIAGNOSIS — S42201A Unspecified fracture of upper end of right humerus, initial encounter for closed fracture: Secondary | ICD-10-CM

## 2014-03-25 DIAGNOSIS — S42209A Unspecified fracture of upper end of unspecified humerus, initial encounter for closed fracture: Secondary | ICD-10-CM | POA: Diagnosis not present

## 2014-03-25 DIAGNOSIS — J449 Chronic obstructive pulmonary disease, unspecified: Secondary | ICD-10-CM | POA: Insufficient documentation

## 2014-03-25 DIAGNOSIS — Y9289 Other specified places as the place of occurrence of the external cause: Secondary | ICD-10-CM | POA: Diagnosis not present

## 2014-03-25 DIAGNOSIS — Z8639 Personal history of other endocrine, nutritional and metabolic disease: Secondary | ICD-10-CM | POA: Diagnosis not present

## 2014-03-25 DIAGNOSIS — Z8719 Personal history of other diseases of the digestive system: Secondary | ICD-10-CM | POA: Diagnosis not present

## 2014-03-25 DIAGNOSIS — S42309A Unspecified fracture of shaft of humerus, unspecified arm, initial encounter for closed fracture: Secondary | ICD-10-CM | POA: Diagnosis not present

## 2014-03-25 DIAGNOSIS — I4891 Unspecified atrial fibrillation: Secondary | ICD-10-CM | POA: Insufficient documentation

## 2014-03-25 DIAGNOSIS — Z79899 Other long term (current) drug therapy: Secondary | ICD-10-CM | POA: Insufficient documentation

## 2014-03-25 DIAGNOSIS — Z880 Allergy status to penicillin: Secondary | ICD-10-CM | POA: Insufficient documentation

## 2014-03-25 DIAGNOSIS — S46909A Unspecified injury of unspecified muscle, fascia and tendon at shoulder and upper arm level, unspecified arm, initial encounter: Secondary | ICD-10-CM | POA: Diagnosis present

## 2014-03-25 DIAGNOSIS — Z862 Personal history of diseases of the blood and blood-forming organs and certain disorders involving the immune mechanism: Secondary | ICD-10-CM | POA: Insufficient documentation

## 2014-03-25 DIAGNOSIS — S4980XA Other specified injuries of shoulder and upper arm, unspecified arm, initial encounter: Secondary | ICD-10-CM | POA: Diagnosis present

## 2014-03-25 MED ORDER — HYDROCODONE-ACETAMINOPHEN 5-325 MG PO TABS: 1.0000 | ORAL_TABLET | ORAL | Status: DC | PRN

## 2014-03-25 NOTE — ED Notes (Signed)
Bed: RESB Expected date:  Expected time:  Means of arrival:  Comments: EMS/fall right shoulder pain positive deformity

## 2014-03-25 NOTE — ED Notes (Signed)
Per [facility] pt from [place] with right shoulder deformity. Per pt was in elevator and slipped on shoes resulting in right shoulder deformity. Pt refuses IV, oxygen, and medication with [facility].

## 2014-03-25 NOTE — ED Provider Notes (Signed)
CSN: [id number]     Arrival date & time 03/25/14  1900 History   First MD Initiated Contact with Patient 03/25/14 1909     Chief Complaint  Patient presents with  . Shoulder Injury      The history is provided by the patient.   Patient presents emergency department because she fell in the elevator this evening.  She reports she slipped on her shoe.  She reports severe pain in the right shoulder region and presents with a deformity to her right proximal shoulder.  She denies numbness or weakness in her arms.  She denies head injury.  No neck pain.  No pain in her hips.  No other complaints.  Patient was in her normal state of health today.  Currently she is not on any medication for pain.  Her pain is mild to moderate in severity worse with movement and palpation of her right shoulder.    Past Medical History  Diagnosis Date  . Seizures   . GERD (gastroesophageal reflux disease)   . Peripheral vascular disease   . Anxiety   . Hyperlipidemia   . Hypertension   . Embolism - blood clot 1996    left breast  . Bone infection of left hand 12/22/2008    secondary to injury  . Hemorrhoids   . Cancer     colon cancer-hx of chemo  . Melanoma     metastatic  . Edema 12/03/2011  . Chronic airway obstruction, not elsewhere classified 10/01/2011  . Malignant neoplasm of stomach, unspecified site 11/11/2010  . Major depressive disorder, single episode, unspecified 11/11/2010  . Alcohol abuse 11/11/2010  . Atrial fibrillation 11/11/2010  . Symptomatic menopausal or female climacteric states 11/11/2010  . Insomnia, unspecified 11/11/2010  . Dizziness and giddiness 04/24/2005  . Tobacco use disorder   . Confusion   . COPD (chronic obstructive pulmonary disease)   . Alcohol abuse, unspecified   . Tachycardia, unspecified   . Generalized convulsive epilepsy without mention of intractable epilepsy    Past Surgical History  Procedure Laterality Date  . [place]    x3  . Abdominal  hysterectomy  1963  . Colon resection  1995  . Vocal cord injection  1996, 1997  . Breast biopsies      x2 on left, x 1 on right  . Hemorrhoid surgery    . Porta cath placement  1995  . Porta cath removal  1996  . Heart ablation  1997  . Bladder repair    . Left femoral artery stent  05/07/2004  . Melanoma removed from left leg  09/06/2004  . Melanoma removed from left groin  09/2004  . Skin graft to left leg  12/05/2004  . Orif left humerus fracture  03/27/2005  . Laser surgery to right eye  10/04/2007  . Removal of tumor in left knee  11/15/2010  . Colonoscopy  10/21/2012    Dr. Paulita Fujita diverticulosis, sessile polyps biopsy    No family history on file. History  Substance Use Topics  . Smoking status: Current Every Day Smoker -- 0.50 packs/day for 60 years    Types: Cigarettes  . Smokeless tobacco: Never Used  . Alcohol Use: No     Comment: quit 02-02-2010   OB History   Grav Para Term Preterm Abortions TAB SAB Ect Mult Living                 Review of Systems  All other systems  reviewed and are negative.     Allergies  Aspirin; Effexor; Venlafaxine; Penicillins; and Zithromax  Home Medications   Prior to Admission medications   Medication Sig Start Date End Date Taking? Authorizing Daylin Gruszka  Homeopathic Products [facility] STYE EYE RELIEF OP) Place 1 drop into the left eye 4 (four) times daily.   Yes Historical Emile Ringgenberg, MD  LAMICTAL 200 MG tablet Take 1 tablet (200 mg total) by mouth 2 (two) times daily. 10/31/13  Yes Marcial Pacas, MD  metoprolol (TOPROL-XL) 50 MG 24 hr tablet Take 50 mg by mouth daily.     Yes Historical Dayna Geurts, MD  valsartan (DIOVAN) 160 MG tablet Take 160 mg by mouth daily.     Yes Historical Elycia Woodside, MD  zonisamide (ZONEGRAN) 100 MG capsule Take 100-200 mg by mouth 2 (two) times daily. 100mg  in the am and 200mg  in the pm   Yes Historical Mykelti Goldenstein, MD  HYDROcodone-acetaminophen (NORCO/VICODIN) 5-325 MG per tablet Take 1 tablet by mouth every 4 (four)  hours as needed for moderate pain. 03/25/14   Hoy Morn, MD   BP 151/73  Pulse 75  Temp(Src) 97.7 F (36.5 C) (Oral)  Resp 19  SpO2 96% Physical Exam  Nursing note and vitals reviewed. Constitutional: She is oriented to person, place, and time. She appears well-developed and well-nourished. No distress.  HENT:  Head: Normocephalic and atraumatic.  Eyes: EOM are normal.  Neck: Normal range of motion.  Cardiovascular: Normal rate, regular rhythm and normal heart sounds.   Pulmonary/Chest: Effort normal and breath sounds normal.  Abdominal: Soft. She exhibits no distension. There is no tenderness.  Musculoskeletal: Normal range of motion.  Full range of motion of bilateral hips.  Full range of motion of left shoulder.  Obvious right shoulder deformity.  No tenderness of the right a.c. joint.  Normal right radial pulse.  Neurological: She is alert and oriented to person, place, and time.  Skin: Skin is warm and dry.  Psychiatric: She has a normal mood and affect. Judgment normal.    ED Course  Procedures (including critical care time) Labs Review Labs Reviewed - No data to display  Imaging Review Dg Shoulder Right  03/25/2014   CLINICAL DATA:  Right shoulder pain after fall.  EXAM: RIGHT SHOULDER - 2+ VIEW  COMPARISON:  None.  FINDINGS: Visualized ribs appear normal. Moderately displaced and comminuted fracture of the proximal right humeral neck is noted. Glenohumeral joint appears normal.  IMPRESSION: Moderately displaced and comminuted proximal right humeral neck fracture.   Electronically Signed   By: Sabino Dick M.D.   On: 03/25/2014 20:01  I personally reviewed the imaging tests through PACS system I reviewed available ER/hospitalization records through the EMR    EKG Interpretation None      MDM   Final diagnoses:  Fracture, humerus, proximal, right, closed, initial encounter    Patient placed in a right shoulder immobilizer.  Home with pain medicine.   Orthopedic followup.    Hoy Morn, MD 03/25/14 [phone]

## 2014-03-27 DIAGNOSIS — S42209A Unspecified fracture of upper end of unspecified humerus, initial encounter for closed fracture: Secondary | ICD-10-CM | POA: Diagnosis not present

## 2014-03-28 DIAGNOSIS — R29818 Other symptoms and signs involving the nervous system: Secondary | ICD-10-CM | POA: Diagnosis not present

## 2014-03-28 DIAGNOSIS — M6281 Muscle weakness (generalized): Secondary | ICD-10-CM | POA: Diagnosis not present

## 2014-04-03 DIAGNOSIS — R29818 Other symptoms and signs involving the nervous system: Secondary | ICD-10-CM | POA: Diagnosis not present

## 2014-04-03 DIAGNOSIS — M6281 Muscle weakness (generalized): Secondary | ICD-10-CM | POA: Diagnosis not present

## 2014-04-04 DIAGNOSIS — R29818 Other symptoms and signs involving the nervous system: Secondary | ICD-10-CM | POA: Diagnosis not present

## 2014-04-04 DIAGNOSIS — M6281 Muscle weakness (generalized): Secondary | ICD-10-CM | POA: Diagnosis not present

## 2014-04-05 DIAGNOSIS — S42209A Unspecified fracture of upper end of unspecified humerus, initial encounter for closed fracture: Secondary | ICD-10-CM | POA: Diagnosis not present

## 2014-04-07 DIAGNOSIS — R29818 Other symptoms and signs involving the nervous system: Secondary | ICD-10-CM | POA: Diagnosis not present

## 2014-04-07 DIAGNOSIS — M6281 Muscle weakness (generalized): Secondary | ICD-10-CM | POA: Diagnosis not present

## 2014-04-10 DIAGNOSIS — R29818 Other symptoms and signs involving the nervous system: Secondary | ICD-10-CM | POA: Diagnosis not present

## 2014-04-10 DIAGNOSIS — M6281 Muscle weakness (generalized): Secondary | ICD-10-CM | POA: Diagnosis not present

## 2014-04-12 DIAGNOSIS — M6281 Muscle weakness (generalized): Secondary | ICD-10-CM | POA: Diagnosis not present

## 2014-04-12 DIAGNOSIS — R29818 Other symptoms and signs involving the nervous system: Secondary | ICD-10-CM | POA: Diagnosis not present

## 2014-04-13 DIAGNOSIS — M6281 Muscle weakness (generalized): Secondary | ICD-10-CM | POA: Diagnosis not present

## 2014-04-13 DIAGNOSIS — R29818 Other symptoms and signs involving the nervous system: Secondary | ICD-10-CM | POA: Diagnosis not present

## 2014-04-17 DIAGNOSIS — R29818 Other symptoms and signs involving the nervous system: Secondary | ICD-10-CM | POA: Diagnosis not present

## 2014-04-17 DIAGNOSIS — M6281 Muscle weakness (generalized): Secondary | ICD-10-CM | POA: Diagnosis not present

## 2014-04-18 DIAGNOSIS — R29818 Other symptoms and signs involving the nervous system: Secondary | ICD-10-CM | POA: Diagnosis not present

## 2014-04-18 DIAGNOSIS — M6281 Muscle weakness (generalized): Secondary | ICD-10-CM | POA: Diagnosis not present

## 2014-04-19 DIAGNOSIS — S42209A Unspecified fracture of upper end of unspecified humerus, initial encounter for closed fracture: Secondary | ICD-10-CM | POA: Diagnosis not present

## 2014-04-21 DIAGNOSIS — M6281 Muscle weakness (generalized): Secondary | ICD-10-CM | POA: Diagnosis not present

## 2014-04-21 DIAGNOSIS — R29818 Other symptoms and signs involving the nervous system: Secondary | ICD-10-CM | POA: Diagnosis not present

## 2014-04-24 DIAGNOSIS — R29818 Other symptoms and signs involving the nervous system: Secondary | ICD-10-CM | POA: Diagnosis not present

## 2014-04-24 DIAGNOSIS — M6281 Muscle weakness (generalized): Secondary | ICD-10-CM | POA: Diagnosis not present

## 2014-04-25 DIAGNOSIS — M6281 Muscle weakness (generalized): Secondary | ICD-10-CM | POA: Diagnosis not present

## 2014-04-25 DIAGNOSIS — R29818 Other symptoms and signs involving the nervous system: Secondary | ICD-10-CM | POA: Diagnosis not present

## 2014-05-03 DIAGNOSIS — S42209A Unspecified fracture of upper end of unspecified humerus, initial encounter for closed fracture: Secondary | ICD-10-CM | POA: Diagnosis not present

## 2014-05-19 ENCOUNTER — Telehealth: Payer: Self-pay | Admitting: Neurology

## 2014-05-19 DIAGNOSIS — G458 Other transient cerebral ischemic attacks and related syndromes: Secondary | ICD-10-CM | POA: Diagnosis not present

## 2014-05-19 DIAGNOSIS — R29898 Other symptoms and signs involving the musculoskeletal system: Secondary | ICD-10-CM | POA: Diagnosis not present

## 2014-05-19 NOTE — Telephone Encounter (Signed)
I have talked with her primary care physician Dr. Kristie Cowman, patient had sudden onset left leg weakness in am, lasting 2 hours, now resolved.   DDx, TIA vs medication side effect.  MRI brain is ordered by Dr. Ronnald Ramp. ASA 81 mg qday. Well hydration. Close observation.  Butch Penny, give her a follow up visit with NP early next week.

## 2014-05-19 NOTE — Telephone Encounter (Signed)
Left message for patient to return call so we can schedule an appointment early next week with one of our NPs, per Dr. Krista Blue.  Told her to ask for Chauncey Reading.

## 2014-05-22 NOTE — Telephone Encounter (Signed)
Spoke to patient and she would like to get her MRI brain results back from Dr. Kristie Cowman before she proceeds with a follow up appointment.  She also relayed that she does not want to see a NP, only the doctor.  I asked her to call back once she has the MRI results, and I would make doctor be aware.

## 2014-05-24 DIAGNOSIS — L600 Ingrowing nail: Secondary | ICD-10-CM | POA: Diagnosis not present

## 2014-05-24 DIAGNOSIS — M79609 Pain in unspecified limb: Secondary | ICD-10-CM | POA: Diagnosis not present

## 2014-05-24 NOTE — Telephone Encounter (Signed)
I have called Jessica [name], failed to reach her left message.  MRI result in our system, I have asked her to bring MRI CD with her if she had it done at another facility,  Hinton Dyer: Please call  Patient with above message also give her a follow up with me in next available within 2 weeks.

## 2014-05-25 NOTE — Telephone Encounter (Signed)
Called patient and spoke with her. Patient states she had her MRI at [facility] and MRI was normal. Patient feels she does not need to make a trip and get CD if normal. Please tell Dr.Yan I don't need to come in at this time.

## 2014-05-26 NOTE — Telephone Encounter (Signed)
I have left message, for her to call back.

## 2014-06-01 DIAGNOSIS — D2370 Other benign neoplasm of skin of unspecified lower limb, including hip: Secondary | ICD-10-CM | POA: Diagnosis not present

## 2014-06-27 ENCOUNTER — Other Ambulatory Visit: Payer: Self-pay | Admitting: Neurology

## 2014-07-17 DIAGNOSIS — Z23 Encounter for immunization: Secondary | ICD-10-CM | POA: Diagnosis not present

## 2014-08-18 ENCOUNTER — Encounter: Payer: Self-pay | Admitting: Adult Health

## 2014-08-18 ENCOUNTER — Ambulatory Visit (INDEPENDENT_AMBULATORY_CARE_PROVIDER_SITE_OTHER): Payer: Medicare Other | Admitting: Adult Health

## 2014-08-18 VITALS — BP 134/75 | HR 87 | Temp 98.3°F | Ht 64.0 in | Wt 123.0 lb

## 2014-08-18 DIAGNOSIS — R569 Unspecified convulsions: Secondary | ICD-10-CM | POA: Diagnosis not present

## 2014-08-18 DIAGNOSIS — Z5181 Encounter for therapeutic drug level monitoring: Secondary | ICD-10-CM

## 2014-08-18 MED ORDER — LAMOTRIGINE 150 MG PO TABS: 150.0000 mg | ORAL_TABLET | Freq: Every morning | ORAL | Status: DC

## 2014-08-18 MED ORDER — LAMICTAL 200 MG PO TABS: 200.0000 mg | ORAL_TABLET | Freq: Every day | ORAL | Status: DC

## 2014-08-18 MED ORDER — ZONISAMIDE 100 MG PO CAPS: ORAL_CAPSULE | ORAL | Status: DC

## 2014-08-18 NOTE — Patient Instructions (Signed)
Seizure, Adult A seizure means there is unusual activity in the brain. A seizure can cause changes in attention or behavior. Seizures often cause shaking (convulsions). Seizures often last from 30 seconds to 2 minutes. HOME CARE   If you are given medicines, take them exactly as told by your doctor.  Keep all doctor visits as told.  Do not swim or drive until your doctor says it is okay.  Teach others what to do if you have a seizure. They should:  Lay you on the ground.  Put a cushion under your head.  Loosen any tight clothing around your neck.  Turn you on your side.  Stay with you until you get better. GET HELP RIGHT AWAY IF:   The seizure lasts longer than 2 to 5 minutes.  The seizure is very bad.  The person does not wake up after the seizure.  The person's attention or behavior changes. Drive the person to the emergency room or call your local emergency services (911 in [place] MAKE SURE YOU:   Understand these instructions.  Will watch your condition.  Will get help right away if you are not doing well or get worse. Document Released: 03/03/2008 Document Revised: 12/08/2011 Document Reviewed: 09/03/2011 [employer] Patient Information 2015 [employer]. This information is not intended to replace advice given to you by your health care provider. Make sure you discuss any questions you have with your health care provider.  
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
## 2014-08-20 LAB — COMPREHENSIVE METABOLIC PANEL
ALT: 7 IU/L (ref 0–32)
AST: 14 IU/L (ref 0–40)
Albumin/Globulin Ratio: 2 (ref 1.1–2.5)
Albumin: 4.4 g/dL (ref 3.5–4.7)
Alkaline Phosphatase: 102 IU/L (ref 39–117)
BUN/Creatinine Ratio: 15 (ref 11–26)
BUN: 19 mg/dL (ref 8–27)
CALCIUM: 9.8 mg/dL (ref 8.7–10.3)
CHLORIDE: 107 mmol/L (ref 97–108)
CO2: 26 mmol/L (ref 18–29)
Creatinine, Ser: 1.31 mg/dL — ABNORMAL HIGH (ref 0.57–1.00)
GFR calc Af Amer: 44 mL/min/{1.73_m2} — ABNORMAL LOW (ref >59)
GFR calc non Af Amer: 38 mL/min/{1.73_m2} — ABNORMAL LOW (ref >59)
Globulin, Total: 2.2 g/dL (ref 1.5–4.5)
Glucose: 84 mg/dL (ref 65–99)
Potassium: 5.1 mmol/L (ref 3.5–5.2)
Sodium: 146 mmol/L — ABNORMAL HIGH (ref 134–144)
Total Bilirubin: 0.2 mg/dL (ref 0.0–1.2)
Total Protein: 6.6 g/dL (ref 6.0–8.5)

## 2014-08-20 LAB — LAMOTRIGINE LEVEL: LAMOTRIGINE LVL: 13.5 ug/mL (ref 2.0–20.0)

## 2014-08-20 LAB — CBC WITH DIFFERENTIAL
Basophils Absolute: 0.1 10*3/uL (ref 0.0–0.2)
Basos: 1 %
EOS: 2 %
Eosinophils Absolute: 0.2 10*3/uL (ref 0.0–0.4)
HCT: 42.5 % (ref 34.0–46.6)
HEMOGLOBIN: 14 g/dL (ref 11.1–15.9)
IMMATURE GRANS (ABS): 0 10*3/uL (ref 0.0–0.1)
IMMATURE GRANULOCYTES: 0 %
LYMPHS: 25 %
Lymphocytes Absolute: 2 10*3/uL (ref 0.7–3.1)
MCH: 29.1 pg (ref 26.6–33.0)
MCHC: 32.9 g/dL (ref 31.5–35.7)
MCV: 88 fL (ref 79–97)
MONOCYTES: 9 %
MONOS ABS: 0.7 10*3/uL (ref 0.1–0.9)
NEUTROS PCT: 63 %
Neutrophils Absolute: 5.1 10*3/uL (ref 1.4–7.0)
PLATELETS: 283 10*3/uL (ref 150–379)
RBC: 4.81 x10E6/uL (ref 3.77–5.28)
RDW: 14.6 % (ref 12.3–15.4)
WBC: 8 10*3/uL (ref 3.4–10.8)

## 2014-08-21 ENCOUNTER — Telehealth: Payer: Self-pay | Admitting: Adult Health

## 2014-08-21 NOTE — Telephone Encounter (Signed)
I called the patient regarding her lab results. I left a message for her to call our office at her convenience.

## 2014-08-21 NOTE — Telephone Encounter (Signed)
I called the patient. Her Lamictal level was in normal range. Her creatinine is elevated. I will forward her lab work to her PCP. The patient will let us know if she has any additional seizures.

## 2014-08-21 NOTE — Telephone Encounter (Signed)
Patient returning Jinny Blossom, NP call regarding blood work results.

## 2014-08-29 NOTE — Progress Notes (Signed)
I agree above plan. 

## 2014-09-28 DIAGNOSIS — N390 Urinary tract infection, site not specified: Secondary | ICD-10-CM | POA: Diagnosis not present

## 2014-09-28 DIAGNOSIS — R350 Frequency of micturition: Secondary | ICD-10-CM | POA: Diagnosis not present

## 2014-09-28 DIAGNOSIS — N760 Acute vaginitis: Secondary | ICD-10-CM | POA: Diagnosis not present

## 2014-10-04 ENCOUNTER — Other Ambulatory Visit: Payer: Self-pay | Admitting: Neurology

## 2014-10-06 ENCOUNTER — Encounter: Payer: Self-pay | Admitting: Internal Medicine

## 2014-11-21 ENCOUNTER — Other Ambulatory Visit: Payer: Self-pay

## 2014-11-21 MED ORDER — LAMICTAL 200 MG PO TABS: 200.0000 mg | ORAL_TABLET | Freq: Every day | ORAL | Status: DC

## 2014-11-21 MED ORDER — LAMICTAL 150 MG PO TABS: 150.0000 mg | ORAL_TABLET | Freq: Every morning | ORAL | Status: DC

## 2014-12-04 ENCOUNTER — Other Ambulatory Visit: Payer: Self-pay | Admitting: Adult Health

## 2014-12-04 NOTE — Telephone Encounter (Signed)
Program requires signature from MD.  Request sent to provider.

## 2014-12-04 NOTE — Telephone Encounter (Signed)
Pt states she needs Rx for LAMICTAL 200 MG tablet and LAMICTAL 150 MG tablet faxed to [place] access fax# [phone] it needs to be for 90 days with 3 refills.  Her ID# is [id number].  Please call and advise.

## 2014-12-11 MED ORDER — LAMICTAL 150 MG PO TABS: 150.0000 mg | ORAL_TABLET | Freq: Every morning | ORAL | Status: DC

## 2014-12-11 MED ORDER — LAMICTAL 200 MG PO TABS: 200.0000 mg | ORAL_TABLET | Freq: Every day | ORAL | Status: DC

## 2015-01-17 ENCOUNTER — Other Ambulatory Visit: Payer: Self-pay | Admitting: Neurology

## 2015-01-17 NOTE — Telephone Encounter (Signed)
I called back.  Explained we are not able to mail Rx's.  Patient would like them to be faxed to [phone] Ref # [id number]

## 2015-01-17 NOTE — Telephone Encounter (Addendum)
Pt needs a written Rx for LAMICTAL 150 MG tablet and  LAMICTAL 200 MG tablet for 90 day supply and with 3 refills.  Pt would like for this to be mailed to her.  Please call and advise.

## 2015-01-18 ENCOUNTER — Encounter: Payer: Self-pay | Admitting: Nurse Practitioner

## 2015-01-18 ENCOUNTER — Non-Acute Institutional Stay: Payer: Medicare Other | Admitting: Nurse Practitioner

## 2015-01-18 VITALS — BP 142/78 | HR 72 | Temp 98.1°F | Wt 120.0 lb

## 2015-01-18 DIAGNOSIS — F419 Anxiety disorder, unspecified: Secondary | ICD-10-CM

## 2015-01-18 DIAGNOSIS — K219 Gastro-esophageal reflux disease without esophagitis: Secondary | ICD-10-CM | POA: Diagnosis not present

## 2015-01-18 DIAGNOSIS — Z299 Encounter for prophylactic measures, unspecified: Secondary | ICD-10-CM

## 2015-01-18 DIAGNOSIS — I10 Essential (primary) hypertension: Secondary | ICD-10-CM

## 2015-01-18 DIAGNOSIS — M19049 Primary osteoarthritis, unspecified hand: Secondary | ICD-10-CM

## 2015-01-18 DIAGNOSIS — Z418 Encounter for other procedures for purposes other than remedying health state: Secondary | ICD-10-CM | POA: Diagnosis not present

## 2015-01-18 DIAGNOSIS — R634 Abnormal weight loss: Secondary | ICD-10-CM

## 2015-01-18 DIAGNOSIS — R569 Unspecified convulsions: Secondary | ICD-10-CM

## 2015-01-18 MED ORDER — LAMICTAL 150 MG PO TABS: 150.0000 mg | ORAL_TABLET | Freq: Every morning | ORAL | Status: DC

## 2015-01-18 MED ORDER — LAMICTAL 200 MG PO TABS: 200.0000 mg | ORAL_TABLET | Freq: Every day | ORAL | Status: DC

## 2015-01-18 NOTE — Assessment & Plan Note (Signed)
Stabilized. Off Nexium

## 2015-01-18 NOTE — Assessment & Plan Note (Addendum)
Controlled-except Sbp 142-she is asymptomatic today-will continue f/u Bp monitoring, takes Metoprolol 50mg  and Diovan 160mg .

## 2015-01-18 NOTE — Assessment & Plan Note (Signed)
Not disabling.   

## 2015-01-18 NOTE — Assessment & Plan Note (Signed)
#   14Ibs in the past 4-5 months. The patient stated her appetite has been poor-denied stomach symptoms-will update CBC, CMP, TSH, Hgb a1c. She declined appetite stimulant today.

## 2015-01-18 NOTE — Progress Notes (Signed)
Patient ID: Jessica [name], female   DOB: 10-06-1931, 81 y.o.   MRN: [id number]   Code Status: DNR, HCPOA, and living will. Requested not to call EMS or send her to hospital when she has seizures.   Allergies  Allergen Reactions  . Aspirin Nausea And Vomiting  . Effexor [Venlafaxine Hcl]     Seizure  . Venlafaxine Other (See Comments)    seizures  . Penicillins Rash  . Zithromax [Azithromycin] Rash    Chief Complaint  Patient presents with  . Medical Management of Chronic Issues    blood pressure, anxiety. Last seen 02/16/14    HPI: Patient is a 81 y.o. female seen in the clinic at [facility] today for chronic medical conditions.  Problem List Items Addressed This Visit    Hypertension (Chronic)    Controlled-except Sbp 142-she is asymptomatic today-will continue f/u Bp monitoring, takes Metoprolol 50mg  and Diovan 160mg .         GERD (gastroesophageal reflux disease)    Stabilized. Off Nexium        Seizures    last seizure activity was 07/2013, takes Zonisamide 100mg  I in am, II in pm, Lamictal 150mg  I am and II pm. f/u Neurology yearly. Update Lamictal and Zonisamide level.         Anxiety - Primary    Off anxiolytic agent. Her anxiety level is high, but she stated she is able to sleep/rest at nigh          Osteoarthritis of finger    Not disabling.       Loss of weight    # 14Ibs in the past 4-5 months. The patient stated her appetite has been poor-denied stomach symptoms-will update CBC, CMP, TSH, Hgb a1c. She declined appetite stimulant today.        Other Visit Diagnoses    Preventive measure        Relevant Orders    DNR (Do Not Resuscitate)       Review of Systems:  Review of Systems  Constitutional: Negative for fever, chills and diaphoresis.  HENT: Negative for congestion, hearing loss and sore throat.   Eyes: Negative for pain, discharge and redness.  Respiratory: Positive for cough. Negative for shortness of breath and  wheezing.   Cardiovascular: Positive for leg swelling (trace in LLE). Negative for chest pain and palpitations.  Gastrointestinal: Negative for nausea, vomiting, abdominal pain, diarrhea, constipation and blood in stool.  Endocrine: Negative for polydipsia.  Genitourinary: Positive for frequency and enuresis. Negative for dysuria, urgency, hematuria and flank pain.  Musculoskeletal: Negative for myalgias, back pain and neck pain.  Skin: Negative for rash.  Allergic/Immunologic: Negative for environmental allergies.  Neurological: Negative for dizziness, tremors, seizures, weakness and headaches.  Hematological: Does not bruise/bleed easily.  Psychiatric/Behavioral: Negative for hallucinations. The patient is nervous/anxious.       Past Medical History  Diagnosis Date  . Seizures   . GERD (gastroesophageal reflux disease)   . Peripheral vascular disease   . Anxiety   . Hyperlipidemia   . Hypertension   . Embolism - blood clot 1996    left breast  . Bone infection of left hand 12/22/2008    secondary to injury  . Hemorrhoids   . Cancer     colon cancer-hx of chemo  . Melanoma     metastatic  . Edema 12/03/2011  . Chronic airway obstruction, not elsewhere classified 10/01/2011  . Malignant neoplasm of stomach, unspecified site 11/11/2010  .  Major depressive disorder, single episode, unspecified 11/11/2010  . Alcohol abuse 11/11/2010  . Atrial fibrillation 11/11/2010  . Symptomatic menopausal or female climacteric states 11/11/2010  . Insomnia, unspecified 11/11/2010  . Dizziness and giddiness 04/24/2005  . Tobacco use disorder   . Confusion   . COPD (chronic obstructive pulmonary disease)   . Alcohol abuse, unspecified   . Tachycardia, unspecified   . Generalized convulsive epilepsy without mention of intractable epilepsy    Past Surgical History  Procedure Laterality Date  . [place]    x3  . Abdominal hysterectomy  1963  . Colon resection  1995  . Vocal  cord injection  1996, 1997  . Breast biopsies      x2 on left, x 1 on right  . Hemorrhoid surgery    . Porta cath placement  1995  . Porta cath removal  1996  . Heart ablation  1997  . Bladder repair    . Left femoral artery stent  05/07/2004  . Melanoma removed from left leg  09/06/2004  . Melanoma removed from left groin  09/2004  . Skin graft to left leg  12/05/2004  . Orif left humerus fracture  03/27/2005  . Laser surgery to right eye  10/04/2007  . Removal of tumor in left knee  11/15/2010  . Colonoscopy  10/21/2012    Dr. Paulita Fujita diverticulosis, sessile polyps biopsy    Social History:   reports that she has been smoking Cigarettes.  She has a 30 pack-year smoking history. She has never used smokeless tobacco. She reports that she does not drink alcohol or use illicit drugs.  History reviewed. No pertinent family history.  Medications: Patient's Medications  New Prescriptions   No medications on file  Previous Medications   HOMEOPATHIC PRODUCTS (SIMILASAN STYE EYE RELIEF OP)    Place 1 drop into the left eye 4 (four) times daily.   HYDROCODONE-ACETAMINOPHEN (NORCO/VICODIN) 5-325 MG PER TABLET    Take 1 tablet by mouth every 4 (four) hours as needed for moderate pain.   LAMICTAL 150 MG TABLET    Take 1 tablet (150 mg total) by mouth every morning.   LAMICTAL 200 MG TABLET    Take 1 tablet (200 mg total) by mouth at bedtime.   METOPROLOL (TOPROL-XL) 50 MG 24 HR TABLET    Take 50 mg by mouth daily.     VALSARTAN (DIOVAN) 160 MG TABLET    Take 160 mg by mouth daily.     ZONISAMIDE (ZONEGRAN) 100 MG CAPSULE    TAKE ONE CAPSULE  IN THE MORNING  AND 2 CAPSULES  IN THE EVENING EACH DAY FOR SEIZURE  Modified Medications   No medications on file  Discontinued Medications   ESOMEPRAZOLE (NEXIUM) 40 MG CAPSULE    Take 40 mg by mouth daily at 12 noon.   ZONISAMIDE (ZONEGRAN) 100 MG CAPSULE    TAKE ONE CAPSULE  IN THE MORNING  AND 2 CAPSULES  IN THE EVENING EACH DAY FOR SEIZURE     Physical  Exam: Physical Exam  Constitutional: She is oriented to person, place, and time. She appears well-developed and well-nourished. No distress.  HENT:  Head: Normocephalic and atraumatic.  Mouth/Throat: No oropharyngeal exudate.  Eyes: Conjunctivae and EOM are normal. Pupils are equal, round, and reactive to light. No scleral icterus.  Neck: Normal range of motion. Neck supple. No JVD present. No thyromegaly present.  Cardiovascular: Normal rate, regular rhythm and normal heart sounds.  No murmur heard. Pulmonary/Chest: Effort normal and breath sounds normal. No respiratory distress. She has no wheezes. She has no rales.  Abdominal: Soft. Bowel sounds are normal. She exhibits no distension. There is no tenderness.  Musculoskeletal: Normal range of motion. She exhibits edema (trace LLE). She exhibits no tenderness.  Lymphadenopathy:    She has no cervical adenopathy.  Neurological: She is alert and oriented to person, place, and time. She displays normal reflexes. No cranial nerve deficit. She exhibits normal muscle tone. Coordination normal.  Skin: She is not diaphoretic.  Left medial thigh surgical scar  Psychiatric: Her mood appears anxious. Her affect is blunt, labile and inappropriate. Her affect is not angry. Her speech is rapid and/or pressured. Her speech is not delayed, not tangential and not slurred. She is hyperactive. She is not agitated, not aggressive, not slowed, not withdrawn, not actively hallucinating and not combative. Thought content is not paranoid and not delusional. Cognition and memory are impaired. She expresses impulsivity and inappropriate judgment. She does not exhibit a depressed mood. She is communicative. She exhibits abnormal recent memory. She exhibits normal remote memory. She is attentive.    Filed Vitals:   01/18/15 1512  BP: 142/78  Pulse: 72  Temp: 98.1 F (36.7 C)  TempSrc: Oral  Weight: 120 lb (54.432 kg)  SpO2: 95%      Labs reviewed: Basic  Metabolic Panel:  Recent Labs  08/18/14 1053  NA 146*  K 5.1  CL 107  CO2 26  GLUCOSE 84  BUN 19  CREATININE 1.31*  CALCIUM 9.8   Liver Function Tests:  Recent Labs  08/18/14 1053  AST 14  ALT 7  ALKPHOS 102  BILITOT 0.2  PROT 6.6   No results for input(s): LIPASE, AMYLASE in the last 8760 hours. No results for input(s): AMMONIA in the last 8760 hours. CBC:  Recent Labs  08/18/14 1053  WBC 8.0  NEUTROABS 5.1  HGB 14.0  HCT 42.5  MCV 88  PLT 283   Lipid Panel: No results for input(s): CHOL, HDL, LDLCALC, TRIG, CHOLHDL, LDLDIRECT in the last 8760 hours. Anemia Panel: No results for input(s): FOLATE, IRON, VITAMINB12 in the last 8760 hours.  Past Procedures:     Assessment/Plan Anxiety Off anxiolytic agent. Her anxiety level is high, but she stated she is able to sleep/rest at nigh       GERD (gastroesophageal reflux disease) Stabilized. Off Nexium     Hypertension Controlled-except Sbp 142-she is asymptomatic today-will continue f/u Bp monitoring, takes Metoprolol 50mg  and Diovan 160mg .      Osteoarthritis of finger Not disabling.    Seizures last seizure activity was 07/2013, takes Zonisamide 100mg  I in am, II in pm, Lamictal 150mg  I am and II pm. f/u Neurology yearly. Update Lamictal and Zonisamide level.      Loss of weight # 14Ibs in the past 4-5 months. The patient stated her appetite has been poor-denied stomach symptoms-will update CBC, CMP, TSH, Hgb a1c. She declined appetite stimulant today.     Family/ Staff Communication: observe the patient.   Goals of Care: [place]  Labs/tests ordered: CBC, CMP, TSH, Hgb A1c, Lamictal level, Zonisamide level

## 2015-01-18 NOTE — Assessment & Plan Note (Signed)
Off anxiolytic agent. Her anxiety level is high, but she stated she is able to sleep/rest at nigh

## 2015-01-18 NOTE — Telephone Encounter (Signed)
Rx's have been signed and faxed

## 2015-01-18 NOTE — Assessment & Plan Note (Addendum)
last seizure activity was 07/2013, takes Zonisamide 100mg  I in am, II in pm, Lamictal 150mg  I am and II pm. f/u Neurology yearly. Update Lamictal and Zonisamide level.

## 2015-03-15 DIAGNOSIS — H00012 Hordeolum externum right lower eyelid: Secondary | ICD-10-CM | POA: Diagnosis not present

## 2015-03-22 ENCOUNTER — Other Ambulatory Visit: Payer: Self-pay | Admitting: Nurse Practitioner

## 2015-03-24 ENCOUNTER — Other Ambulatory Visit: Payer: Self-pay | Admitting: Nurse Practitioner

## 2015-03-29 ENCOUNTER — Non-Acute Institutional Stay: Payer: Medicare Other | Admitting: Nurse Practitioner

## 2015-03-29 ENCOUNTER — Encounter: Payer: Self-pay | Admitting: Nurse Practitioner

## 2015-03-29 VITALS — BP 138/64 | HR 80 | Temp 98.0°F | Wt 121.0 lb

## 2015-03-29 DIAGNOSIS — R000 Tachycardia, unspecified: Secondary | ICD-10-CM

## 2015-03-29 DIAGNOSIS — I739 Peripheral vascular disease, unspecified: Secondary | ICD-10-CM | POA: Diagnosis not present

## 2015-03-29 DIAGNOSIS — M19049 Primary osteoarthritis, unspecified hand: Secondary | ICD-10-CM | POA: Diagnosis not present

## 2015-03-29 DIAGNOSIS — K219 Gastro-esophageal reflux disease without esophagitis: Secondary | ICD-10-CM | POA: Diagnosis not present

## 2015-03-29 DIAGNOSIS — R569 Unspecified convulsions: Secondary | ICD-10-CM

## 2015-03-29 DIAGNOSIS — I10 Essential (primary) hypertension: Secondary | ICD-10-CM | POA: Diagnosis not present

## 2015-03-29 DIAGNOSIS — R634 Abnormal weight loss: Secondary | ICD-10-CM | POA: Diagnosis not present

## 2015-03-29 DIAGNOSIS — J410 Simple chronic bronchitis: Secondary | ICD-10-CM

## 2015-03-29 DIAGNOSIS — F419 Anxiety disorder, unspecified: Secondary | ICD-10-CM

## 2015-03-29 NOTE — Assessment & Plan Note (Signed)
Not disabling.   

## 2015-03-29 NOTE — Assessment & Plan Note (Signed)
Stabilized. Off Nexium

## 2015-03-29 NOTE — Assessment & Plan Note (Signed)
Chronic cough, smoke cessation reinforced. Coarse rhonchi noted.

## 2015-03-29 NOTE — Assessment & Plan Note (Signed)
Mainly in the left lower leg, claudication presents with walking, delayed vascular specialist referral.

## 2015-03-29 NOTE — Assessment & Plan Note (Signed)
Permissive blood pressure control,  takes Metoprolol 50mg  and Diovan 160mg .

## 2015-03-29 NOTE — Assessment & Plan Note (Signed)
last seizure activity was 07/2013, takes Zonisamide 100mg  I in am, II in pm, Lamictal 150mg  I am and II pm. f/u Neurology yearly.

## 2015-03-29 NOTE — Assessment & Plan Note (Addendum)
Not eating and sleeping well, lost near 20Ibs over the past 3 years, especially since her husband passed away 01-30-15. Admitted she is depressed but declined any treatment given hx of seizures associated with SSIR use. Continue to observe the patient.

## 2015-03-29 NOTE — Progress Notes (Signed)
Patient ID: Jessica [name], female   DOB: 04-09-1932, 81 y.o.   MRN: [id number]   Code Status: DNR, HCPOA, and living will. Requested not to call EMS or send her to hospital when she has seizures.   Allergies  Allergen Reactions  . Aspirin Nausea And Vomiting  . Effexor [Venlafaxine Hcl]     Seizure  . Venlafaxine Other (See Comments)    seizures  . Penicillins Rash  . Zithromax [Azithromycin] Rash    Chief Complaint  Patient presents with  . Medical Management of Chronic Issues    blood pressure    HPI: Patient is a 81 y.o. female seen in the clinic at [facility] today for chronic medical conditions.  Problem List Items Addressed This Visit    Hypertension (Chronic)    Permissive blood pressure control,  takes Metoprolol 50mg  and Diovan 160mg .          COPD (chronic obstructive pulmonary disease)    Chronic cough, smoke cessation reinforced. Coarse rhonchi noted.       GERD (gastroesophageal reflux disease)    Stabilized. Off Nexium        Seizures    last seizure activity was 07/2013, takes Zonisamide 100mg  I in am, II in pm, Lamictal 150mg  I am and II pm. f/u Neurology yearly.        Peripheral vascular disease    Mainly in the left lower leg, claudication presents with walking, delayed vascular specialist referral.       Anxiety - Primary    Off anxiolytic agent. Her anxiety level is high, but she stated she is able to sleep/rest at nigh         Tachycardia    Hear rate is in control       Osteoarthritis of finger    Not disabling.        Loss of weight    Not eating and sleeping well, lost near 20Ibs over the past 3 years, especially since her husband passed away Feb 05, 2015. Admitted she is depressed but declined any treatment given hx of seizures associated with SSIR use. Continue to observe the patient.          Review of Systems:  Review of Systems  Constitutional: Positive for unexpected weight change. Negative for  fever, chills and diaphoresis.       Gradual weight loss over the past 3 years.   HENT: Negative for congestion, hearing loss and sore throat.   Eyes: Negative for pain, discharge and redness.  Respiratory: Positive for cough. Negative for shortness of breath and wheezing.   Cardiovascular: Positive for leg swelling (trace in LLE). Negative for chest pain and palpitations.       Left leg pain after walking a certain amount of distance.   Gastrointestinal: Negative for nausea, vomiting, abdominal pain, diarrhea, constipation and blood in stool.  Endocrine: Negative for polydipsia.  Genitourinary: Positive for frequency and enuresis. Negative for dysuria, urgency, hematuria and flank pain.  Musculoskeletal: Negative for myalgias, back pain and neck pain.  Skin: Negative for rash.  Allergic/Immunologic: Negative for environmental allergies.  Neurological: Negative for dizziness, tremors, seizures, weakness and headaches.  Hematological: Does not bruise/bleed easily.  Psychiatric/Behavioral: Negative for hallucinations. The patient is nervous/anxious.       Past Medical History  Diagnosis Date  . Seizures   . GERD (gastroesophageal reflux disease)   . Peripheral vascular disease   . Anxiety   . Hyperlipidemia   . Hypertension   .  Embolism - blood clot 1996    left breast  . Bone infection of left hand 12/22/2008    secondary to injury  . Hemorrhoids   . Cancer     colon cancer-hx of chemo  . Melanoma     metastatic  . Edema 12/03/2011  . Chronic airway obstruction, not elsewhere classified 10/01/2011  . Malignant neoplasm of stomach, unspecified site 11/11/2010  . Major depressive disorder, single episode, unspecified 11/11/2010  . Alcohol abuse 11/11/2010  . Atrial fibrillation 11/11/2010  . Symptomatic menopausal or female climacteric states 11/11/2010  . Insomnia, unspecified 11/11/2010  . Dizziness and giddiness 04/24/2005  . Tobacco use disorder   . Confusion   . COPD (chronic  obstructive pulmonary disease)   . Alcohol abuse, unspecified   . Tachycardia, unspecified   . Generalized convulsive epilepsy without mention of intractable epilepsy    Past Surgical History  Procedure Laterality Date  . [place]    x3  . Abdominal hysterectomy  1963  . Colon resection  1995  . Vocal cord injection  1996, 1997  . Breast biopsies      x2 on left, x 1 on right  . Hemorrhoid surgery    . Porta cath placement  1995  . Porta cath removal  1996  . Heart ablation  1997  . Bladder repair    . Left femoral artery stent  05/07/2004  . Melanoma removed from left leg  09/06/2004  . Melanoma removed from left groin  09/2004  . Skin graft to left leg  12/05/2004  . Orif left humerus fracture  03/27/2005  . Laser surgery to right eye  10/04/2007  . Removal of tumor in left knee  11/15/2010  . Colonoscopy  10/21/2012    Dr. Paulita Fujita diverticulosis, sessile polyps biopsy    Social History:   reports that she has been smoking Cigarettes.  She has a 30 pack-year smoking history. She has never used smokeless tobacco. She reports that she does not drink alcohol or use illicit drugs.  History reviewed. No pertinent family history.  Medications: Patient's Medications  New Prescriptions   No medications on file  Previous Medications   HOMEOPATHIC PRODUCTS (SIMILASAN STYE EYE RELIEF OP)    Place 1 drop into the left eye 4 (four) times daily.   LAMICTAL 150 MG TABLET    Take 1 tablet (150 mg total) by mouth every morning.   LAMICTAL 200 MG TABLET    Take 1 tablet (200 mg total) by mouth at bedtime.   METOPROLOL (TOPROL-XL) 50 MG 24 HR TABLET    Take 50 mg by mouth daily.     VALSARTAN (DIOVAN) 160 MG TABLET    TAKE 1 TABLET BY MOUTH DAILY FOR BLOOD PRESSURE   ZONISAMIDE (ZONEGRAN) 100 MG CAPSULE    TAKE ONE CAPSULE  IN THE MORNING  AND 2 CAPSULES  IN THE EVENING EACH DAY FOR SEIZURE  Modified Medications   No medications on file  Discontinued Medications    HYDROCODONE-ACETAMINOPHEN (NORCO/VICODIN) 5-325 MG PER TABLET    Take 1 tablet by mouth every 4 (four) hours as needed for moderate pain.     Physical Exam: Physical Exam  Constitutional: She is oriented to person, place, and time. She appears well-developed and well-nourished. No distress.  HENT:  Head: Normocephalic and atraumatic.  Mouth/Throat: No oropharyngeal exudate.  Eyes: Conjunctivae and EOM are normal. Pupils are equal, round, and reactive to light. No scleral icterus.  Neck:  Normal range of motion. Neck supple. No JVD present. No thyromegaly present.  Cardiovascular: Normal rate, regular rhythm and normal heart sounds.   No murmur heard. Left leg claudication   Pulmonary/Chest: Effort normal and breath sounds normal. No respiratory distress. She has no wheezes. She has no rales.  Abdominal: Soft. Bowel sounds are normal. She exhibits no distension. There is no tenderness.  Musculoskeletal: Normal range of motion. She exhibits edema (trace LLE). She exhibits no tenderness.  Lymphadenopathy:    She has no cervical adenopathy.  Neurological: She is alert and oriented to person, place, and time. She displays normal reflexes. No cranial nerve deficit. She exhibits normal muscle tone. Coordination normal.  Skin: She is not diaphoretic.  Left medial thigh surgical scar  Psychiatric: Her mood appears anxious. Her affect is blunt, labile and inappropriate. Her affect is not angry. Her speech is rapid and/or pressured. Her speech is not delayed, not tangential and not slurred. She is hyperactive. She is not agitated, not aggressive, not slowed, not withdrawn, not actively hallucinating and not combative. Thought content is not paranoid and not delusional. Cognition and memory are impaired. She expresses impulsivity and inappropriate judgment. She does not exhibit a depressed mood. She is communicative. She exhibits abnormal recent memory. She exhibits normal remote memory. She is attentive.      Filed Vitals:   03/29/15 1508  BP: 138/64  Pulse: 80  Temp: 98 F (36.7 C)  TempSrc: Oral  Weight: 121 lb (54.885 kg)  SpO2: 97%      Labs reviewed: Basic Metabolic Panel:  Recent Labs  08/18/14 1053  NA 146*  K 5.1  CL 107  CO2 26  GLUCOSE 84  BUN 19  CREATININE 1.31*  CALCIUM 9.8   Liver Function Tests:  Recent Labs  08/18/14 1053  AST 14  ALT 7  ALKPHOS 102  BILITOT 0.2  PROT 6.6   No results for input(s): LIPASE, AMYLASE in the last 8760 hours. No results for input(s): AMMONIA in the last 8760 hours. CBC:  Recent Labs  08/18/14 1053  WBC 8.0  NEUTROABS 5.1  HGB 14.0  HCT 42.5  MCV 88  PLT 283   Lipid Panel: No results for input(s): CHOL, HDL, LDLCALC, TRIG, CHOLHDL, LDLDIRECT in the last 8760 hours. Anemia Panel: No results for input(s): FOLATE, IRON, VITAMINB12 in the last 8760 hours.  Past Procedures:  None recently  Assessment/Plan Anxiety Off anxiolytic agent. Her anxiety level is high, but she stated she is able to sleep/rest at nigh     GERD (gastroesophageal reflux disease) Stabilized. Off Nexium    Hypertension Permissive blood pressure control,  takes Metoprolol 50mg  and Diovan 160mg .      Loss of weight Not eating and sleeping well, lost near 20Ibs over the past 3 years, especially since her husband passed away Feb 21, 2015. Admitted she is depressed but declined any treatment given hx of seizures associated with SSIR use. Continue to observe the patient.   Seizures last seizure activity was 07/2013, takes Zonisamide 100mg  I in am, II in pm, Lamictal 150mg  I am and II pm. f/u Neurology yearly.    Osteoarthritis of finger Not disabling.    Peripheral vascular disease Mainly in the left lower leg, claudication presents with walking, delayed vascular specialist referral.   Tachycardia Hear rate is in control   COPD (chronic obstructive pulmonary disease) Chronic cough, smoke cessation reinforced. Coarse  rhonchi noted.    Family/ Staff Communication: observe the patient.   Goals  of Care: IL  Labs/tests ordered: none

## 2015-03-29 NOTE — Assessment & Plan Note (Signed)
Off anxiolytic agent. Her anxiety level is high, but she stated she is able to sleep/rest at nigh

## 2015-03-29 NOTE — Assessment & Plan Note (Signed)
Hear rate is in control.  

## 2015-05-09 ENCOUNTER — Other Ambulatory Visit: Payer: Self-pay

## 2015-05-09 MED ORDER — METOPROLOL SUCCINATE ER 50 MG PO TB24: 50.0000 mg | ORAL_TABLET | Freq: Every day | ORAL | Status: DC

## 2015-07-17 DIAGNOSIS — I4891 Unspecified atrial fibrillation: Secondary | ICD-10-CM | POA: Diagnosis not present

## 2015-07-17 DIAGNOSIS — I10 Essential (primary) hypertension: Secondary | ICD-10-CM | POA: Diagnosis not present

## 2015-07-17 DIAGNOSIS — C801 Malignant (primary) neoplasm, unspecified: Secondary | ICD-10-CM | POA: Diagnosis not present

## 2015-07-17 LAB — TSH: TSH: 2.07 u[IU]/mL (ref 0.41–5.90)

## 2015-07-17 LAB — HEPATIC FUNCTION PANEL
ALT: 8 U/L (ref 7–35)
AST: 15 U/L (ref 13–35)
Alkaline Phosphatase: 101 U/L (ref 25–125)
Bilirubin, Total: 0.4 mg/dL

## 2015-07-17 LAB — BASIC METABOLIC PANEL
BUN: 13 mg/dL (ref 4–21)
Creatinine: 1 mg/dL (ref 0.5–1.1)
GLUCOSE: 99 mg/dL
Potassium: 4 mmol/L (ref 3.4–5.3)
SODIUM: 141 mmol/L (ref 137–147)

## 2015-07-17 LAB — CBC AND DIFFERENTIAL
HEMATOCRIT: 44 % (ref 36–46)
Hemoglobin: 14.6 g/dL (ref 12.0–16.0)
PLATELETS: 283 10*3/uL (ref 150–399)
WBC: 8.8 10*3/mL

## 2015-07-18 ENCOUNTER — Encounter: Payer: Self-pay | Admitting: *Deleted

## 2015-07-19 ENCOUNTER — Encounter: Payer: Self-pay | Admitting: Nurse Practitioner

## 2015-07-19 ENCOUNTER — Non-Acute Institutional Stay: Payer: Medicare Other | Admitting: Nurse Practitioner

## 2015-07-19 VITALS — BP 144/80 | HR 80 | Temp 98.1°F | Wt 119.0 lb

## 2015-07-19 DIAGNOSIS — R569 Unspecified convulsions: Secondary | ICD-10-CM | POA: Diagnosis not present

## 2015-07-19 DIAGNOSIS — F419 Anxiety disorder, unspecified: Secondary | ICD-10-CM

## 2015-07-19 DIAGNOSIS — I10 Essential (primary) hypertension: Secondary | ICD-10-CM

## 2015-07-19 DIAGNOSIS — J410 Simple chronic bronchitis: Secondary | ICD-10-CM | POA: Diagnosis not present

## 2015-07-19 DIAGNOSIS — K219 Gastro-esophageal reflux disease without esophagitis: Secondary | ICD-10-CM | POA: Diagnosis not present

## 2015-07-19 DIAGNOSIS — R634 Abnormal weight loss: Secondary | ICD-10-CM

## 2015-07-19 NOTE — Assessment & Plan Note (Signed)
last seizure activity was 07/2013, continue Zonisamide 100mg  I in am, II in pm, Lamictal 150mg  I am and II pm. f/u Neurology yearly.

## 2015-07-19 NOTE — Assessment & Plan Note (Addendum)
Stated I don't feel good, continue loss weight, CBC, CMP, TSH 07/17/15 unremarkable, will obtain UA C/S, CXR

## 2015-07-19 NOTE — Assessment & Plan Note (Signed)
Permissive blood pressure control,  Continue Metoprolol 50mg  and Diovan 160mg .

## 2015-07-19 NOTE — Assessment & Plan Note (Signed)
Off anxiolytic agent. Her anxiety level is high, but she stated she is able to sleep/rest at nigh    

## 2015-07-19 NOTE — Assessment & Plan Note (Signed)
Chronic cough, smoke cessation reinforced. Coarse rhonchi noted.  

## 2015-07-19 NOTE — Assessment & Plan Note (Signed)
Stabilized. Off Nexium   

## 2015-07-19 NOTE — Progress Notes (Signed)
Patient ID: Jessica [name], female   DOB: 09-Nov-1931, 81 y.o.   MRN: [id number]  Location:  clinic [place] Provider:  Marlana Latus NP  Code Status:  DNR Goals of care: Advanced Directive information    Chief Complaint  Patient presents with  . Weight Loss    weak, decrease appetite for several months     HPI: Patient is a 81 y.o. female seen in the clinic at [facility] today for evaluation of hx of hypertension, well controlled, no recent seizures, asymptomatic GERD, off meds. Still smoking w/o attempts of cessation. Chronic cough but no exacerbation of her COPE  Review of Systems:  Review of Systems  Constitutional: Negative for fever, chills and diaphoresis.       Gradual weight loss over the past 3 years.   HENT: Negative for congestion, hearing loss and sore throat.   Eyes: Negative for pain, discharge and redness.  Respiratory: Positive for cough. Negative for shortness of breath and wheezing.   Cardiovascular: Positive for leg swelling (trace in LLE). Negative for chest pain and palpitations.       Left leg pain after walking a certain amount of distance.   Gastrointestinal: Negative for nausea, vomiting, abdominal pain, diarrhea, constipation and blood in stool.  Genitourinary: Positive for frequency. Negative for dysuria, urgency, hematuria and flank pain.  Musculoskeletal: Negative for myalgias, back pain and neck pain.  Skin: Negative for rash.  Neurological: Negative for dizziness, tremors, seizures, weakness and headaches.  Endo/Heme/Allergies: Negative for environmental allergies and polydipsia. Does not bruise/bleed easily.  Psychiatric/Behavioral: Negative for hallucinations. The patient is nervous/anxious.     Past Medical History  Diagnosis Date  . Seizures ([place])   . GERD (gastroesophageal reflux disease)   . Peripheral vascular disease ([place])   . Anxiety   . Hyperlipidemia   . Hypertension   . Embolism - blood clot 1996    left breast  . Bone  infection of left hand ([place]) 12/22/2008    secondary to injury  . Hemorrhoids   . Cancer ([place])     colon cancer-hx of chemo  . Melanoma ([place])     metastatic  . Edema 12/03/2011  . Chronic airway obstruction, not elsewhere classified 10/01/2011  . Malignant neoplasm of stomach, unspecified site 11/11/2010  . Major depressive disorder, single episode, unspecified ([place]) 11/11/2010  . Alcohol abuse 11/11/2010  . Atrial fibrillation ([place]) 11/11/2010  . Symptomatic menopausal or female climacteric states 11/11/2010  . Insomnia, unspecified 11/11/2010  . Dizziness and giddiness 04/24/2005  . Tobacco use disorder   . Confusion   . COPD (chronic obstructive pulmonary disease) ([place])   . Alcohol abuse, unspecified   . Tachycardia, unspecified   . Generalized convulsive epilepsy without mention of intractable epilepsy     Patient Active Problem List   Diagnosis Date Noted  . Loss of weight 01/18/2015  . Osteoarthritis of finger 02/16/2014  . Seizures ([place])   . Peripheral vascular disease ([place])   . Anxiety   . Hyperlipidemia   . Cancer ([place])   . Confusion   . Alcohol abuse, unspecified   . Tachycardia   . Generalized convulsive epilepsy without mention of intractable epilepsy   . GERD (gastroesophageal reflux disease) 02/03/2013  . Acute exacerbation of chronic obstructive pulmonary disease (COPD) ([place]) 10/07/2011  . Encephalopathy 10/03/2011  . PVD (peripheral vascular disease) ([place]) 10/03/2011  . Personal history of colon cancer 10/03/2011  . Bronchitis 09/26/2011  . COPD (chronic obstructive pulmonary disease) ([place]) 09/26/2011  .  Hypertension 09/26/2011  . Hyperlipemia 09/26/2011  . Tobacco abuse 09/26/2011  . Diarrhea 09/26/2011  . Bone infection of left hand ([place]) 12/22/2008    Allergies  Allergen Reactions  . Aspirin Nausea And Vomiting  . Effexor [Venlafaxine Hcl]     Seizure  . Venlafaxine Other (See Comments)    seizures  . Penicillins Rash  . Zithromax [Azithromycin] Rash      Medications: Patient's Medications  New Prescriptions   No medications on file  Previous Medications   LAMICTAL 150 MG TABLET    Take 1 tablet (150 mg total) by mouth every morning.   LAMICTAL 200 MG TABLET    Take 1 tablet (200 mg total) by mouth at bedtime.   METOPROLOL SUCCINATE (TOPROL-XL) 50 MG 24 HR TABLET    Take 1 tablet (50 mg total) by mouth daily.   VALSARTAN (DIOVAN) 160 MG TABLET    TAKE 1 TABLET BY MOUTH DAILY FOR BLOOD PRESSURE   ZONISAMIDE (ZONEGRAN) 100 MG CAPSULE    TAKE ONE CAPSULE  IN THE MORNING  AND 2 CAPSULES  IN THE EVENING EACH DAY FOR SEIZURE  Modified Medications   No medications on file  Discontinued Medications   HOMEOPATHIC PRODUCTS (SIMILASAN STYE EYE RELIEF OP)    Place 1 drop into the left eye 4 (four) times daily.    Physical Exam: Filed Vitals:   07/19/15 1625  BP: 144/80  Pulse: 80  Temp: 98.1 F (36.7 C)  TempSrc: Oral  Weight: 119 lb (53.978 kg)  SpO2: 94%   Body mass index is 20.42 kg/(m^2).  Physical Exam  Constitutional: She is oriented to person, place, and time. She appears well-developed and well-nourished. No distress.  HENT:  Head: Normocephalic and atraumatic.  Mouth/Throat: No oropharyngeal exudate.  Eyes: Conjunctivae and EOM are normal. Pupils are equal, round, and reactive to light. No scleral icterus.  Neck: Normal range of motion. Neck supple. No JVD present. No thyromegaly present.  Cardiovascular: Normal rate, regular rhythm and normal heart sounds.   No murmur heard. Left leg claudication   Pulmonary/Chest: Effort normal and breath sounds normal. No respiratory distress. She has no wheezes. She has no rales.  Abdominal: Soft. Bowel sounds are normal. She exhibits no distension. There is no tenderness.  Musculoskeletal: Normal range of motion. She exhibits edema (trace LLE). She exhibits no tenderness.  Lymphadenopathy:    She has no cervical adenopathy.  Neurological: She is alert and oriented to person, place,  and time. She displays normal reflexes. No cranial nerve deficit. She exhibits normal muscle tone. Coordination normal.  Skin: She is not diaphoretic.  Left medial thigh surgical scar  Psychiatric: Her mood appears anxious. Her affect is blunt, labile and inappropriate. Her affect is not angry. Her speech is rapid and/or pressured. Her speech is not delayed, not tangential and not slurred. She is hyperactive. She is not agitated, not aggressive, not slowed, not withdrawn, not actively hallucinating and not combative. Thought content is not paranoid and not delusional. Cognition and memory are impaired. She expresses impulsivity and inappropriate judgment. She does not exhibit a depressed mood. She is communicative. She exhibits abnormal recent memory. She exhibits normal remote memory. She is attentive.    Labs reviewed: Basic Metabolic Panel:  Recent Labs  08/18/14 1053 07/17/15  NA 146* 141  K 5.1 4.0  CL 107  --   CO2 26  --   GLUCOSE 84  --   BUN 19 13  CREATININE 1.31* 1.0  CALCIUM  9.8  --     Liver Function Tests:  Recent Labs  08/18/14 1053 07/17/15  AST 14 15  ALT 7 8  ALKPHOS 102 101  BILITOT 0.2  --   PROT 6.6  --   ALBUMIN 4.4  --     CBC:  Recent Labs  08/18/14 1053 07/17/15  WBC 8.0 8.8  NEUTROABS 5.1  --   HGB 14.0 14.6  HCT 42.5 44  MCV 88  --   PLT 283 283    Lab Results  Component Value Date   TSH 2.07 07/17/2015   No results found for: HGBA1C No results found for: CHOL, HDL, LDLCALC, LDLDIRECT, TRIG, CHOLHDL  Significant Diagnostic Results since last visit: none  Patient Care Team: Christain Sacramento, MD as PCP - General (Family Medicine)  Assessment/Plan Problem List Items Addressed This Visit    Hypertension - Primary (Chronic)    Permissive blood pressure control,  Continue Metoprolol 50mg  and Diovan 160mg .       COPD (chronic obstructive pulmonary disease) (HCC)    Chronic cough, smoke cessation reinforced. Coarse rhonchi noted.         Relevant Orders   DG Chest 2 View   GERD (gastroesophageal reflux disease)    Stabilized. Off Nexium       Seizures ([place])    last seizure activity was 07/2013, continue Zonisamide 100mg  I in am, II in pm, Lamictal 150mg  I am and II pm. f/u Neurology yearly.      Anxiety    Off anxiolytic agent. Her anxiety level is high, but she stated she is able to sleep/rest at nigh      Loss of weight    Stated I don't feel good, continue loss weight, CBC, CMP, TSH 07/17/15 unremarkable, will obtain UA C/S, CXR          Family/ staff Communication: continue IL  Labs/tests ordered: CBC, CMP, TSH done 07/17/15 , UA C/S and CXR  [facility] Ezequiel Macauley NP Geriatrics [facility] [address], [place] [address] On Call:  [phone] & follow prompts after 5pm & weekends Office Phone:  [phone] Office Fax:  [phone]

## 2015-07-24 ENCOUNTER — Ambulatory Visit
Admission: RE | Admit: 2015-07-24 | Discharge: 2015-07-24 | Disposition: A | Discharge status: Home / Self Care | Payer: Medicare Other | Source: Ambulatory Visit | Attending: Nurse Practitioner | Admitting: Nurse Practitioner

## 2015-07-24 DIAGNOSIS — J410 Simple chronic bronchitis: Secondary | ICD-10-CM

## 2015-07-24 DIAGNOSIS — R300 Dysuria: Secondary | ICD-10-CM | POA: Diagnosis not present

## 2015-07-24 DIAGNOSIS — R05 Cough: Secondary | ICD-10-CM | POA: Diagnosis not present

## 2015-07-26 ENCOUNTER — Telehealth: Payer: Self-pay

## 2015-07-26 ENCOUNTER — Other Ambulatory Visit: Payer: Self-pay

## 2015-07-26 MED ORDER — LEVOFLOXACIN 250 MG PO TABS: ORAL_TABLET | ORAL | Status: DC

## 2015-07-26 NOTE — Telephone Encounter (Signed)
Fax received from facility with results of Urine Sample, patient with positive UTI. Janett Billow (NP) was given report to advise.  Keflex was prescribed, per patient she can not take Keflex due to PCN allergy. Report was to [place] to rx alternate

## 2015-07-26 NOTE — Telephone Encounter (Signed)
If she can not take Keflex this needs to be added to allergy list.  Please call pt and start Levaquin 250 mg daily for 3 days

## 2015-07-27 ENCOUNTER — Telehealth: Payer: Self-pay

## 2015-07-27 NOTE — Telephone Encounter (Signed)
Called the patient again this morning to make sure she got the message I left her last night.  No answer left message on voice mail.

## 2015-07-27 NOTE — Telephone Encounter (Signed)
Message was left per Alan Mulder informing patient alternative ABX called in

## 2015-07-31 NOTE — Telephone Encounter (Signed)
Called patient to verify that she received the message to pick up her medication at pharmacy no answer. Left message on machine.

## 2015-08-02 ENCOUNTER — Encounter: Payer: Self-pay | Admitting: Nurse Practitioner

## 2015-08-02 ENCOUNTER — Non-Acute Institutional Stay: Payer: Medicare Other | Admitting: Nurse Practitioner

## 2015-08-02 VITALS — BP 138/76 | HR 72 | Temp 97.6°F | Wt 119.0 lb

## 2015-08-02 DIAGNOSIS — I10 Essential (primary) hypertension: Secondary | ICD-10-CM

## 2015-08-02 DIAGNOSIS — K219 Gastro-esophageal reflux disease without esophagitis: Secondary | ICD-10-CM

## 2015-08-02 DIAGNOSIS — F419 Anxiety disorder, unspecified: Secondary | ICD-10-CM | POA: Diagnosis not present

## 2015-08-02 DIAGNOSIS — R634 Abnormal weight loss: Secondary | ICD-10-CM

## 2015-08-02 DIAGNOSIS — J410 Simple chronic bronchitis: Secondary | ICD-10-CM

## 2015-08-02 DIAGNOSIS — R569 Unspecified convulsions: Secondary | ICD-10-CM | POA: Diagnosis not present

## 2015-08-02 NOTE — Progress Notes (Signed)
Patient ID: Jessica [name], female   DOB: June 14, 1932, 81 y.o.   MRN: [id number]  Location:  clinic [facility] Provider:  Marlana Latus NP  Code Status:  DNR Goals of care: Advanced Directive information Does patient have an advance directive?: Yes, Type of Advance Directive: [place];Out of facility DNR (pink MOST or yellow form), Pre-existing out of facility DNR order (yellow form or pink MOST form): Yellow form placed in chart (order not valid for inpatient use)  Chief Complaint  Patient presents with  . Weakness    weight loss, UTI follow-up, review chest x-ray, labs     HPI: Patient is a 81 y.o. female seen in the clinic at [facility] today for evaluation of weight loss, anxiety/depression, stopped Mirtazapine herself, hx of hypertension, well controlled, no recent seizures, asymptomatic GERD, off meds. Still smoking w/o attempts of cessation. Chronic cough but no exacerbation of her COPE  Review of Systems:  Review of Systems  Constitutional: Negative for fever, chills and diaphoresis.       Gradual weight loss over the past 3 years.   HENT: Negative for congestion, hearing loss and sore throat.   Eyes: Negative for pain, discharge and redness.  Respiratory: Positive for cough. Negative for shortness of breath and wheezing.   Cardiovascular: Positive for leg swelling (trace in LLE). Negative for chest pain and palpitations.       Left leg pain after walking a certain amount of distance.   Gastrointestinal: Negative for nausea, vomiting, abdominal pain, diarrhea, constipation and blood in stool.  Genitourinary: Positive for frequency. Negative for dysuria, urgency, hematuria and flank pain.  Musculoskeletal: Negative for myalgias, back pain and neck pain.  Skin: Negative for rash.  Neurological: Negative for dizziness, tremors, seizures, weakness and headaches.  Endo/Heme/Allergies: Negative for environmental allergies and polydipsia. Does not  bruise/bleed easily.  Psychiatric/Behavioral: Negative for hallucinations. The patient is nervous/anxious.     Past Medical History  Diagnosis Date  . Seizures ([place])   . GERD (gastroesophageal reflux disease)   . Peripheral vascular disease ([place])   . Anxiety   . Hyperlipidemia   . Hypertension   . Embolism - blood clot 1996    left breast  . Bone infection of left hand ([place]) 12/22/2008    secondary to injury  . Hemorrhoids   . Cancer ([place])     colon cancer-hx of chemo  . Melanoma ([place])     metastatic  . Edema 12/03/2011  . Chronic airway obstruction, not elsewhere classified 10/01/2011  . Malignant neoplasm of stomach, unspecified site 11/11/2010  . Major depressive disorder, single episode, unspecified ([place]) 11/11/2010  . Alcohol abuse 11/11/2010  . Atrial fibrillation ([place]) 11/11/2010  . Symptomatic menopausal or female climacteric states 11/11/2010  . Insomnia, unspecified 11/11/2010  . Dizziness and giddiness 04/24/2005  . Tobacco use disorder   . Confusion   . COPD (chronic obstructive pulmonary disease) ([place])   . Alcohol abuse, unspecified   . Tachycardia, unspecified   . Generalized convulsive epilepsy without mention of intractable epilepsy   . Urgency of urination     Patient Active Problem List   Diagnosis Date Noted  . Loss of weight 01/18/2015  . Osteoarthritis of finger 02/16/2014  . Seizures ([place])   . Peripheral vascular disease ([place])   . Anxiety   . Hyperlipidemia   . Cancer ([place])   . Confusion   . Alcohol abuse, unspecified   . Tachycardia   . Generalized convulsive epilepsy without  mention of intractable epilepsy   . GERD (gastroesophageal reflux disease) 02/03/2013  . Acute exacerbation of chronic obstructive pulmonary disease (COPD) ([place]) 10/07/2011  . Encephalopathy 10/03/2011  . PVD (peripheral vascular disease) ([place]) 10/03/2011  . Personal history of colon cancer 10/03/2011  . Bronchitis 09/26/2011  . COPD (chronic obstructive pulmonary disease)  ([place]) 09/26/2011  . Hypertension 09/26/2011  . Hyperlipemia 09/26/2011  . Tobacco abuse 09/26/2011  . Diarrhea 09/26/2011  . Bone infection of left hand ([place]) 12/22/2008    Allergies  Allergen Reactions  . Aspirin Nausea And Vomiting  . Effexor [Venlafaxine Hcl]     Seizure  . Keflex [Cephalexin]   . Venlafaxine Other (See Comments)    seizures  . Penicillins Rash  . Zithromax [Azithromycin] Rash    Medications: Patient's Medications  New Prescriptions   No medications on file  Previous Medications   LAMICTAL 150 MG TABLET    Take 1 tablet (150 mg total) by mouth every morning.   LAMICTAL 200 MG TABLET    Take 1 tablet (200 mg total) by mouth at bedtime.   LEVOFLOXACIN (LEVAQUIN) 250 MG TABLET    Take 1 tablet ( 250 mg ) by mouth daily for 3 days   METOPROLOL SUCCINATE (TOPROL-XL) 50 MG 24 HR TABLET    Take 1 tablet (50 mg total) by mouth daily.   VALSARTAN (DIOVAN) 160 MG TABLET    TAKE 1 TABLET BY MOUTH DAILY FOR BLOOD PRESSURE   ZONISAMIDE (ZONEGRAN) 100 MG CAPSULE    TAKE ONE CAPSULE  IN THE MORNING  AND 2 CAPSULES  IN THE EVENING EACH DAY FOR SEIZURE  Modified Medications   No medications on file  Discontinued Medications   No medications on file    Physical Exam: Filed Vitals:   08/02/15 1428  BP: 138/76  Pulse: 72  Temp: 97.6 F (36.4 C)  TempSrc: Oral  Weight: 119 lb (53.978 kg)  SpO2: 93%   Body mass index is 20.42 kg/(m^2).  Physical Exam  Constitutional: She is oriented to person, place, and time. She appears well-developed and well-nourished. No distress.  HENT:  Head: Normocephalic and atraumatic.  Mouth/Throat: No oropharyngeal exudate.  Eyes: Conjunctivae and EOM are normal. Pupils are equal, round, and reactive to light. No scleral icterus.  Neck: Normal range of motion. Neck supple. No JVD present. No thyromegaly present.  Cardiovascular: Normal rate, regular rhythm and normal heart sounds.   No murmur heard. Left leg claudication     Pulmonary/Chest: Effort normal and breath sounds normal. No respiratory distress. She has no wheezes. She has no rales.  Abdominal: Soft. Bowel sounds are normal. She exhibits no distension. There is no tenderness.  Musculoskeletal: Normal range of motion. She exhibits edema (trace LLE). She exhibits no tenderness.  Lymphadenopathy:    She has no cervical adenopathy.  Neurological: She is alert and oriented to person, place, and time. She displays normal reflexes. No cranial nerve deficit. She exhibits normal muscle tone. Coordination normal.  Skin: She is not diaphoretic.  Left medial thigh surgical scar  Psychiatric: Her mood appears anxious. Her affect is blunt, labile and inappropriate. Her affect is not angry. Her speech is rapid and/or pressured. Her speech is not delayed, not tangential and not slurred. She is hyperactive. She is not agitated, not aggressive, not slowed, not withdrawn, not actively hallucinating and not combative. Thought content is not paranoid and not delusional. Cognition and memory are impaired. She expresses impulsivity and inappropriate judgment. She does not exhibit  a depressed mood. She is communicative. She exhibits abnormal recent memory. She exhibits normal remote memory. She is attentive.    Labs reviewed: Basic Metabolic Panel:  Recent Labs  08/18/14 1053 07/17/15  NA 146* 141  K 5.1 4.0  CL 107  --   CO2 26  --   GLUCOSE 84  --   BUN 19 13  CREATININE 1.31* 1.0  CALCIUM 9.8  --     Liver Function Tests:  Recent Labs  08/18/14 1053 07/17/15  AST 14 15  ALT 7 8  ALKPHOS 102 101  BILITOT 0.2  --   PROT 6.6  --   ALBUMIN 4.4  --     CBC:  Recent Labs  08/18/14 1053 07/17/15  WBC 8.0 8.8  NEUTROABS 5.1  --   HGB 14.0 14.6  HCT 42.5 44  MCV 88  --   PLT 283 283    Lab Results  Component Value Date   TSH 2.07 07/17/2015   No results found for: HGBA1C No results found for: CHOL, HDL, LDLCALC, LDLDIRECT, TRIG,  CHOLHDL  Significant Diagnostic Results since last visit: none  Patient Care Team: Christain Sacramento, MD as PCP - General (Family Medicine)  Assessment/Plan Problem List Items Addressed This Visit    Seizures ([place])    last seizure activity was 07/2013, continue Zonisamide 100mg  I in am, II in pm, Lamictal 150mg  I am and II pm. f/u Neurology yearly.      Loss of weight    Stated I don't feel good, continue loss weight, CBC, CMP, TSH 07/17/15 unremarkable, UA and CXR unremarkable. resume Mirtazapine at  7.5mg  daily, observe      Hypertension (Chronic)    Permissive blood pressure control,  Continue Metoprolol 50mg  and Diovan 160mg .       GERD (gastroesophageal reflux disease)    Stabilized. Off Nexium       COPD (chronic obstructive pulmonary disease) (HCC)    Chronic cough, smoke cessation reinforced. Coarse rhonchi noted.       Anxiety - Primary    she stated she is able to sleep/rest at nigh. Resume  Mirtazapine at 7.5mg  for anxiety and weight           Family/ staff Communication: continue IL  Labs/tests ordered: none  [facility] Maurene Hollin NP Geriatrics [facility] [address], [place] [address] On Call:  [phone] & follow prompts after 5pm & weekends Office Phone:  [phone] Office Fax:  [phone]

## 2015-08-02 NOTE — Assessment & Plan Note (Signed)
last seizure activity was 07/2013, continue Zonisamide 100mg  I in am, II in pm, Lamictal 150mg  I am and II pm. f/u Neurology yearly.

## 2015-08-02 NOTE — Assessment & Plan Note (Signed)
Chronic cough, smoke cessation reinforced. Coarse rhonchi noted.  

## 2015-08-02 NOTE — Assessment & Plan Note (Addendum)
Stated I don't feel good, continue loss weight, CBC, CMP, TSH 07/17/15 unremarkable, UA and CXR unremarkable. resume Mirtazapine at  7.5mg  daily, observe

## 2015-08-02 NOTE — Assessment & Plan Note (Addendum)
she stated she is able to sleep/rest at nigh. Resume  Mirtazapine at 7.5mg  for anxiety and weight

## 2015-08-02 NOTE — Assessment & Plan Note (Signed)
Permissive blood pressure control,  Continue Metoprolol 50mg  and Diovan 160mg .

## 2015-08-02 NOTE — Assessment & Plan Note (Signed)
Stabilized. Off Nexium   

## 2015-08-05 ENCOUNTER — Other Ambulatory Visit: Payer: Self-pay | Admitting: Nurse Practitioner

## 2015-08-16 DIAGNOSIS — H5713 Ocular pain, bilateral: Secondary | ICD-10-CM | POA: Diagnosis not present

## 2015-08-20 ENCOUNTER — Ambulatory Visit (INDEPENDENT_AMBULATORY_CARE_PROVIDER_SITE_OTHER): Payer: Medicare Other | Admitting: Adult Health

## 2015-08-20 ENCOUNTER — Encounter: Payer: Self-pay | Admitting: Adult Health

## 2015-08-20 VITALS — BP 167/85 | HR 78 | Ht 64.0 in | Wt 116.0 lb

## 2015-08-20 DIAGNOSIS — G40309 Generalized idiopathic epilepsy and epileptic syndromes, not intractable, without status epilepticus: Secondary | ICD-10-CM | POA: Diagnosis not present

## 2015-08-20 DIAGNOSIS — Z5181 Encounter for therapeutic drug level monitoring: Secondary | ICD-10-CM | POA: Diagnosis not present
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
## 2015-08-20 NOTE — Patient Instructions (Addendum)
Continue Lamictal and Zonegran If you have any seizure events please let us know.  Follow up with PCP regarding head cold- Allegra does not interfere with seizure medication If your symptoms worsen or you develop new symptoms please let us know.

## 2015-08-22 ENCOUNTER — Telehealth: Payer: Self-pay

## 2015-08-22 LAB — COMPREHENSIVE METABOLIC PANEL
ALT: 7 IU/L (ref 0–32)
AST: 11 IU/L (ref 0–40)
Albumin/Globulin Ratio: 1.7 (ref 1.1–2.5)
Albumin: 4.3 g/dL (ref 3.5–4.7)
Alkaline Phosphatase: 100 IU/L (ref 39–117)
BILIRUBIN TOTAL: 0.2 mg/dL (ref 0.0–1.2)
BUN/Creatinine Ratio: 15 (ref 11–26)
BUN: 15 mg/dL (ref 8–27)
CHLORIDE: 102 mmol/L (ref 97–106)
CO2: 25 mmol/L (ref 18–29)
CREATININE: 1.01 mg/dL — AB (ref 0.57–1.00)
Calcium: 10.1 mg/dL (ref 8.7–10.3)
GFR calc Af Amer: 60 mL/min/{1.73_m2} (ref >59)
GFR calc non Af Amer: 52 mL/min/{1.73_m2} — ABNORMAL LOW (ref >59)
GLUCOSE: 103 mg/dL — AB (ref 65–99)
Globulin, Total: 2.6 g/dL (ref 1.5–4.5)
Potassium: 4.3 mmol/L (ref 3.5–5.2)
Sodium: 142 mmol/L (ref 136–144)
TOTAL PROTEIN: 6.9 g/dL (ref 6.0–8.5)

## 2015-08-22 LAB — CBC WITH DIFFERENTIAL/PLATELET
BASOS ABS: 0.1 10*3/uL (ref 0.0–0.2)
Basos: 1 %
EOS (ABSOLUTE): 0.1 10*3/uL (ref 0.0–0.4)
Eos: 1 %
HEMOGLOBIN: 14.3 g/dL (ref 11.1–15.9)
Hematocrit: 43.7 % (ref 34.0–46.6)
IMMATURE GRANULOCYTES: 0 %
Immature Grans (Abs): 0 10*3/uL (ref 0.0–0.1)
Lymphocytes Absolute: 1.3 10*3/uL (ref 0.7–3.1)
Lymphs: 14 %
MCH: 29.2 pg (ref 26.6–33.0)
MCHC: 32.7 g/dL (ref 31.5–35.7)
MCV: 89 fL (ref 79–97)
Monocytes Absolute: 0.7 10*3/uL (ref 0.1–0.9)
Monocytes: 7 %
Neutrophils Absolute: 7.4 10*3/uL — ABNORMAL HIGH (ref 1.4–7.0)
Neutrophils: 77 %
Platelets: 275 10*3/uL (ref 150–379)
RBC: 4.9 x10E6/uL (ref 3.77–5.28)
RDW: 14.2 % (ref 12.3–15.4)
WBC: 9.6 10*3/uL (ref 3.4–10.8)

## 2015-08-22 LAB — LAMOTRIGINE LEVEL: LAMOTRIGINE LVL: 17.2 ug/mL (ref 2.0–20.0)

## 2015-08-22 NOTE — Progress Notes (Signed)
I have reviewed and agreed above plan. 

## 2015-08-22 NOTE — Telephone Encounter (Signed)
-----   Message from Ward Givens, NP sent at 08/22/2015  7:46 AM EST ----- Lab work is unremarkable. Please call patient.

## 2015-08-22 NOTE — Telephone Encounter (Signed)
I called the patient and relayed results. 

## 2015-09-27 ENCOUNTER — Encounter: Payer: Self-pay | Admitting: Nurse Practitioner

## 2015-09-27 ENCOUNTER — Non-Acute Institutional Stay: Payer: Medicare Other | Admitting: Nurse Practitioner

## 2015-09-27 VITALS — BP 146/88 | HR 61 | Temp 97.4°F | Ht 64.0 in | Wt 116.8 lb

## 2015-09-27 DIAGNOSIS — R634 Abnormal weight loss: Secondary | ICD-10-CM

## 2015-09-27 DIAGNOSIS — K219 Gastro-esophageal reflux disease without esophagitis: Secondary | ICD-10-CM | POA: Diagnosis not present

## 2015-09-27 DIAGNOSIS — J449 Chronic obstructive pulmonary disease, unspecified: Secondary | ICD-10-CM | POA: Diagnosis not present

## 2015-09-27 DIAGNOSIS — R569 Unspecified convulsions: Secondary | ICD-10-CM | POA: Diagnosis not present

## 2015-09-27 DIAGNOSIS — I10 Essential (primary) hypertension: Secondary | ICD-10-CM

## 2015-09-27 DIAGNOSIS — F419 Anxiety disorder, unspecified: Secondary | ICD-10-CM

## 2015-09-27 NOTE — Assessment & Plan Note (Addendum)
Stated feels better, weights stable #116Ibs, CBC, CMP, TSH 07/17/15 unremarkable, UA and CXR unremarkable. off Mirtazapine at  7.5mg  daily, observe

## 2015-09-27 NOTE — Assessment & Plan Note (Signed)
Chronic cough, smoke cessation reinforced. Coarse rhonchi noted.  

## 2015-09-27 NOTE — Assessment & Plan Note (Signed)
Permissive blood pressure control,  Continue Metoprolol 50mg and Diovan 160mg.  

## 2015-09-27 NOTE — Assessment & Plan Note (Signed)
Stabilized. Off Nexium   

## 2015-09-27 NOTE — Assessment & Plan Note (Addendum)
she stated she is able to sleep/rest at night. off Mirtazapine at 7.5mg  per the patient.

## 2015-09-27 NOTE — Progress Notes (Signed)
Patient ID: Jessica [name], female   DOB: [birth year], 81 y.o.   MRN: [id number]  Location:  clinic [place] Provider:  Marlana Latus NP  Code Status:  DNR Goals of care: Advanced Directive information    Chief Complaint  Patient presents with  . Medical Management of Chronic Issues    Medical Management of Chronic Issues. 6 Month follow up     HPI: Patient is a 81 y.o. female seen in the clinic at [facility] today for evaluation of weight loss, anxiety/depression, stopped Mirtazapine herself, hx of hypertension, well controlled, no recent seizures, asymptomatic GERD, off meds. Still smoking w/o attempts of cessation. Chronic cough but no exacerbation of her COPE  Review of Systems  Constitutional: Negative for fever, chills, weight loss and diaphoresis.       Gradual weight loss over the past 3 years. Stable in the past 2 months, # 116 Ibs.   HENT: Negative for congestion, hearing loss and sore throat.   Eyes: Negative for pain, discharge and redness.  Respiratory: Positive for cough. Negative for shortness of breath and wheezing.   Cardiovascular: Positive for leg swelling (trace in LLE). Negative for chest pain and palpitations.       Left leg pain after walking a certain amount of distance.   Gastrointestinal: Negative for nausea, vomiting, abdominal pain, diarrhea, constipation and blood in stool.  Genitourinary: Positive for frequency. Negative for dysuria, urgency, hematuria and flank pain.  Musculoskeletal: Negative for myalgias, back pain and neck pain.  Skin: Negative for rash.  Neurological: Negative for dizziness, tremors, seizures, weakness and headaches.  Endo/Heme/Allergies: Negative for environmental allergies and polydipsia. Does not bruise/bleed easily.  Psychiatric/Behavioral: Negative for hallucinations. The patient is nervous/anxious.     Past Medical History  Diagnosis Date  . Seizures ([place])   . GERD (gastroesophageal reflux disease)   . Peripheral  vascular disease ([place])   . Anxiety   . Hyperlipidemia   . Hypertension   . Embolism - blood clot 1996    left breast  . Bone infection of left hand ([place]) 12/22/2008    secondary to injury  . Hemorrhoids   . Cancer ([place])     colon cancer-hx of chemo  . Melanoma ([place])     metastatic  . Edema 12/03/2011  . Chronic airway obstruction, not elsewhere classified 10/01/2011  . Malignant neoplasm of stomach, unspecified site 11/11/2010  . Major depressive disorder, single episode, unspecified ([place]) 11/11/2010  . Alcohol abuse 11/11/2010  . Atrial fibrillation ([place]) 11/11/2010  . Symptomatic menopausal or female climacteric states 11/11/2010  . Insomnia, unspecified 11/11/2010  . Dizziness and giddiness 04/24/2005  . Tobacco use disorder   . Confusion   . COPD (chronic obstructive pulmonary disease) ([place])   . Alcohol abuse, unspecified   . Tachycardia, unspecified   . Generalized convulsive epilepsy without mention of intractable epilepsy   . Urgency of urination     Patient Active Problem List   Diagnosis Date Noted  . Loss of weight 01/18/2015  . Osteoarthritis of finger 02/16/2014  . Seizures ([place])   . Peripheral vascular disease ([place])   . Anxiety   . Hyperlipidemia   . Cancer ([place])   . Confusion   . Alcohol abuse, unspecified   . Tachycardia   . Generalized convulsive epilepsy ([place])   . GERD (gastroesophageal reflux disease) 02/03/2013  . Acute exacerbation of chronic obstructive pulmonary disease (COPD) ([place]) 10/07/2011  . Encephalopathy 10/03/2011  . PVD (peripheral vascular disease) ([place]) 10/03/2011  .  Personal history of colon cancer 10/03/2011  . Bronchitis 09/26/2011  . COPD (chronic obstructive pulmonary disease) ([place]) 09/26/2011  . Hypertension 09/26/2011  . Hyperlipemia 09/26/2011  . Tobacco abuse 09/26/2011  . Diarrhea 09/26/2011  . Bone infection of left hand ([place]) 12/22/2008    Allergies  Allergen Reactions  . Aspirin Nausea And Vomiting  . Effexor [Venlafaxine  Hcl]     Seizure  . Keflex [Cephalexin]   . Venlafaxine Other (See Comments)    seizures  . Penicillins Rash  . Zithromax [Azithromycin] Rash    Medications: Patient's Medications  New Prescriptions   No medications on file  Previous Medications   LAMICTAL 150 MG TABLET    Take 1 tablet (150 mg total) by mouth every morning.   LAMICTAL 200 MG TABLET    Take 1 tablet (200 mg total) by mouth at bedtime.   METOPROLOL SUCCINATE (TOPROL-XL) 50 MG 24 HR TABLET    TAKE 1 TABLET (50 MG TOTAL) BY MOUTH DAILY.   VALSARTAN (DIOVAN) 160 MG TABLET    TAKE 1 TABLET BY MOUTH DAILY FOR BLOOD PRESSURE   ZONISAMIDE (ZONEGRAN) 100 MG CAPSULE    TAKE ONE CAPSULE  IN THE MORNING  AND 2 CAPSULES  IN THE EVENING EACH DAY FOR SEIZURE  Modified Medications   No medications on file  Discontinued Medications   LEVOFLOXACIN (LEVAQUIN) 250 MG TABLET    Take 1 tablet ( 250 mg ) by mouth daily for 3 days    Physical Exam: Filed Vitals:   09/27/15 1345  BP: 146/88  Pulse: 61  Temp: 97.4 F (36.3 C)  TempSrc: Oral  Height: 5\' 4"  (1.626 m)  Weight: 116 lb 12.8 oz (52.98 kg)   Body mass index is 20.04 kg/(m^2).  Physical Exam  Constitutional: She is oriented to person, place, and time. She appears well-developed and well-nourished. No distress.  HENT:  Head: Normocephalic and atraumatic.  Mouth/Throat: No oropharyngeal exudate.  Eyes: Conjunctivae and EOM are normal. Pupils are equal, round, and reactive to light. No scleral icterus.  Neck: Normal range of motion. Neck supple. No JVD present. No thyromegaly present.  Cardiovascular: Normal rate, regular rhythm and normal heart sounds.   No murmur heard. Left leg claudication   Pulmonary/Chest: Effort normal and breath sounds normal. No respiratory distress. She has no wheezes. She has no rales.  Abdominal: Soft. Bowel sounds are normal. She exhibits no distension. There is no tenderness.  Musculoskeletal: Normal range of motion. She exhibits edema  (trace LLE). She exhibits no tenderness.  Lymphadenopathy:    She has no cervical adenopathy.  Neurological: She is alert and oriented to person, place, and time. She displays normal reflexes. No cranial nerve deficit. She exhibits normal muscle tone. Coordination normal.  Skin: She is not diaphoretic.  Left medial thigh surgical scar  Psychiatric: Her mood appears anxious. Her affect is blunt, labile and inappropriate. Her affect is not angry. Her speech is rapid and/or pressured. Her speech is not delayed, not tangential and not slurred. She is hyperactive. She is not agitated, not aggressive, not slowed, not withdrawn, not actively hallucinating and not combative. Thought content is not paranoid and not delusional. Cognition and memory are impaired. She expresses impulsivity and inappropriate judgment. She does not exhibit a depressed mood. She is communicative. She exhibits abnormal recent memory. She exhibits normal remote memory. She is attentive.    Labs reviewed: Basic Metabolic Panel:  Recent Labs  07/17/15 08/20/15 1009  NA 141 142  K  4.0 4.3  CL  --  102  CO2  --  25  GLUCOSE  --  103*  BUN 13 15  CREATININE 1.0 1.01*  CALCIUM  --  10.1    Liver Function Tests:  Recent Labs  07/17/15 08/20/15 1009  AST 15 11  ALT 8 7  ALKPHOS 101 100  BILITOT  --  0.2  PROT  --  6.9  ALBUMIN  --  4.3    CBC:  Recent Labs  07/17/15 08/20/15 1009  WBC 8.8 9.6  NEUTROABS  --  7.4*  HGB 14.6  --   HCT 44 43.7  PLT 283  --     Lab Results  Component Value Date   TSH 2.07 07/17/2015   No results found for: HGBA1C No results found for: CHOL, HDL, LDLCALC, LDLDIRECT, TRIG, CHOLHDL  Significant Diagnostic Results since last visit: none  Patient Care Team: Murdis Flitton X, NP as PCP - General (Internal Medicine)  Assessment/Plan Problem List Items Addressed This Visit    Seizures ([place]) - Primary    last seizure activity was 07/2013, continue Zonisamide 100mg  I in am, II in  pm, Lamictal 150mg  I am and II pm. f/u Neurology yearly      Loss of weight    Stated feels better, weights stable #116Ibs, CBC, CMP, TSH 07/17/15 unremarkable, UA and CXR unremarkable. off Mirtazapine at  7.5mg  daily, observe      Hypertension (Chronic)    Permissive blood pressure control,  Continue Metoprolol 50mg  and Diovan 160mg       GERD (gastroesophageal reflux disease)    Stabilized. Off Nexium       COPD (chronic obstructive pulmonary disease) (HCC)    Chronic cough, smoke cessation reinforced. Coarse rhonchi noted.       Anxiety    she stated she is able to sleep/rest at night. off Mirtazapine at 7.5mg  per the patient.           Family/ staff Communication: continue IL  Labs/tests ordered: none  [facility] Maria Coin NP Geriatrics [facility] [address], [place] [address] On Call:  [phone] & follow prompts after 5pm & weekends Office Phone:  [phone] Office Fax:  [phone]

## 2015-09-27 NOTE — Assessment & Plan Note (Signed)
last seizure activity was 07/2013, continue Zonisamide 100mg I in am, II in pm, Lamictal 150mg I am and II pm. f/u Neurology yearly. 

## 2015-10-04 ENCOUNTER — Other Ambulatory Visit: Payer: Self-pay | Admitting: Adult Health

## 2015-10-04 ENCOUNTER — Other Ambulatory Visit: Payer: Self-pay | Admitting: Nurse Practitioner

## 2015-12-03 ENCOUNTER — Telehealth: Payer: Self-pay | Admitting: Neurology

## 2015-12-03 MED ORDER — LAMICTAL 200 MG PO TABS: 200.0000 mg | ORAL_TABLET | Freq: Every day | ORAL | Status: DC

## 2015-12-03 MED ORDER — LAMICTAL 150 MG PO TABS: 150.0000 mg | ORAL_TABLET | Freq: Every morning | ORAL | Status: DC

## 2015-12-03 NOTE — Telephone Encounter (Signed)
Patient called to request refill of LAMICTAL 150 MG tablet, requests Rx for (2) month supply, due to Rx running out and has to submit a new application to local pharmacy [facility] on [address] [place]) and it takes time to process that. Patient requests (1) month supply of LAMICTAL 200 MG tablet.

## 2015-12-03 NOTE — Telephone Encounter (Signed)
Spoke to patient - she needs refills for Lamictal 150mg  and Lamictal 200mg  sent in with refills to [facility].  She is in the process of completing a renewal application for patient assistance.  She is going to keep her pending appt with Dr. Krista Blue.

## 2015-12-03 NOTE — Telephone Encounter (Signed)
Patient also requests to switch Doctor's, doesn't know which Doctor to switch to.

## 2015-12-07 ENCOUNTER — Encounter: Payer: Self-pay | Admitting: *Deleted

## 2015-12-11 ENCOUNTER — Encounter: Payer: Self-pay | Admitting: *Deleted

## 2015-12-31 ENCOUNTER — Telehealth: Payer: Self-pay | Admitting: Neurology

## 2015-12-31 MED ORDER — LAMICTAL 200 MG PO TABS: 200.0000 mg | ORAL_TABLET | Freq: Every day | ORAL | Status: DC

## 2015-12-31 MED ORDER — LAMICTAL 150 MG PO TABS: 150.0000 mg | ORAL_TABLET | Freq: Every morning | ORAL | Status: DC

## 2015-12-31 NOTE — Telephone Encounter (Signed)
Pt request refill for zonisamide (ZONEGRAN) 100 MG capsule please send to [facility]

## 2015-12-31 NOTE — Telephone Encounter (Signed)
Called Jessica [name] - they have a new prescription already on hold for Zonegran and will get it ready for the patient.  Her Lamictal prescriptions have been printed, signed and faxed to [employer] [facility] to the requested number below.

## 2015-12-31 NOTE — Telephone Encounter (Signed)
Patient called to request a prescription be sent to manufacturer [place] fax# [phone] for LAMICTAL 200 MG tablet and LAMICTAL 150 MG tablet, states we did this last year for her. Manufacturer will cover the costs of this medication for her once we fax in Rx.

## 2015-12-31 NOTE — Telephone Encounter (Signed)
Pt called said she wanted to change providers. She said it was unsatisfactory treatment, said Dr Krista Blue did not always return phone calls and was unavailable. Pt is concerned with having seizures her treatment needs to be followed closely. Patient would need recommendation which provider she should see in the future.

## 2015-12-31 NOTE — Telephone Encounter (Signed)
Dr. Krista Blue last saw this patient 07/2013 - she follows Megan. Spoke to patient - stated she had an incident two years ago in which a prescription had to be sent more than once to the pharmacy.  She said the office did not return her call.  I apologized and told her that was not how we operated.  Unfortunately with the incident being two years ago, I was unable to provide her with the explanation that she was requesting from me.  I assured her we would take care of her prescription request she placed today (see other encounter) and she was satisfied.  Her annual follow up appt was scheduled with her while we were on the phone.

## 2016-01-17 ENCOUNTER — Non-Acute Institutional Stay: Payer: Medicare Other | Admitting: Nurse Practitioner

## 2016-01-17 VITALS — HR 80 | Temp 97.8°F | Ht 64.0 in | Wt 118.2 lb

## 2016-01-17 DIAGNOSIS — G40309 Generalized idiopathic epilepsy and epileptic syndromes, not intractable, without status epilepticus: Secondary | ICD-10-CM

## 2016-01-17 DIAGNOSIS — I10 Essential (primary) hypertension: Secondary | ICD-10-CM

## 2016-01-17 DIAGNOSIS — R634 Abnormal weight loss: Secondary | ICD-10-CM | POA: Diagnosis not present

## 2016-01-17 DIAGNOSIS — J441 Chronic obstructive pulmonary disease with (acute) exacerbation: Secondary | ICD-10-CM | POA: Diagnosis not present

## 2016-01-17 DIAGNOSIS — I739 Peripheral vascular disease, unspecified: Secondary | ICD-10-CM

## 2016-01-17 DIAGNOSIS — F419 Anxiety disorder, unspecified: Secondary | ICD-10-CM | POA: Diagnosis not present

## 2016-01-17 NOTE — Assessment & Plan Note (Signed)
she stated she is able to sleep/rest at night. off Mirtazapine at 7.5mg  per the patient. Eats better and weights are stable.

## 2016-01-17 NOTE — Progress Notes (Signed)
Patient ID: Jessica [name], female   DOB: June 09, 1932, 82 y.o.   MRN: [id number]   Location:  [facility] (12) Provider: Marlana Latus NP  Code Status: DNR Goals of Care:  Advanced Directives 01/17/2016  Does patient have an advance directive? Yes  Type of Advance Directive [place]  Does patient want to make changes to advanced directive? No - Patient declined  Copy of advanced directive(s) in chart? Yes  Pre-existing out of facility DNR order (yellow form or pink MOST form) -     Chief Complaint  Patient presents with  . Medical Management of Chronic Issues    Routine Visit    HPI: Patient is a 82 y.o. female seen today for medical management of chronic diseases.  Hx of chronic smoker, no plan of cessation. No active seizure activities since last visit, taking Lamictal 150mg  am 200mg  night, Zonegran 100mg  am 200mg  night. Blood pressure is controlled while on Diovan 160mg  and Metoprolol 50mg  daily. Her anxiety and depression are improved as evidenced sleeps and eats better, her weights has been stable.    Past Medical History  Diagnosis Date  . Seizures ([place])   . GERD (gastroesophageal reflux disease)   . Peripheral vascular disease ([place])   . Anxiety   . Hyperlipidemia   . Hypertension   . Embolism - blood clot 1996    left breast  . Bone infection of left hand ([place]) 12/22/2008    secondary to injury  . Hemorrhoids   . Cancer ([place])     colon cancer-hx of chemo  . Melanoma ([place])     metastatic  . Edema 12/03/2011  . Chronic airway obstruction, not elsewhere classified 10/01/2011  . Malignant neoplasm of stomach, unspecified site 11/11/2010  . Major depressive disorder, single episode, unspecified ([place]) 11/11/2010  . Alcohol abuse 11/11/2010  . Atrial fibrillation ([place]) 11/11/2010  . Symptomatic menopausal or female climacteric states 11/11/2010  . Insomnia, unspecified 11/11/2010  . Dizziness and giddiness 04/24/2005    . Tobacco use disorder   . Confusion   . COPD (chronic obstructive pulmonary disease) ([place])   . Alcohol abuse, unspecified   . Tachycardia, unspecified   . Generalized convulsive epilepsy without mention of intractable epilepsy   . Urgency of urination     Past Surgical History  Procedure Laterality Date  . [place]    x3  . Abdominal hysterectomy  1963  . Colon resection  1995  . Vocal cord injection  1996, 1997  . Breast biopsies      x2 on left, x 1 on right  . Hemorrhoid surgery    . Porta cath placement  1995  . Porta cath removal  1996  . Heart ablation  1997  . Bladder repair    . Left femoral artery stent  05/07/2004  . Melanoma removed from left leg  09/06/2004  . Melanoma removed from left groin  09/2004  . Skin graft to left leg  12/05/2004  . Orif left humerus fracture  03/27/2005  . Laser surgery to right eye  10/04/2007  . Removal of tumor in left knee  11/15/2010  . Colonoscopy  10/21/2012    Dr. Paulita Fujita diverticulosis, sessile polyps biopsy     Allergies  Allergen Reactions  . Aspirin Nausea And Vomiting  . Effexor [Venlafaxine Hcl]     Seizure  . Keflex [Cephalexin]   . Venlafaxine Other (See Comments)  seizures  . Penicillins Rash  . Zithromax [Azithromycin] Rash      Medication List       This list is accurate as of: 01/17/16  3:49 PM.  Always use your most recent med list.               LAMICTAL 150 MG tablet  Generic drug:  lamoTRIgine  Take 1 tablet (150 mg total) by mouth every morning.     LAMICTAL 200 MG tablet  Generic drug:  lamoTRIgine  Take 1 tablet (200 mg total) by mouth at bedtime.     metoprolol succinate 50 MG 24 hr tablet  Commonly known as:  TOPROL-XL  TAKE 1 TABLET (50 MG TOTAL) BY MOUTH DAILY.     valsartan 160 MG tablet  Commonly known as:  DIOVAN  TAKE 1 TABLET BY MOUTH DAILY FOR BLOOD PRESSURE     zonisamide 100 MG capsule  Commonly known as:  ZONEGRAN  TAKE ONE CAPSULE  IN THE MORNING  AND 2  CAPSULES  IN THE EVENING EACH DAY FOR SEIZURE        Review of Systems:  Review of Systems  Constitutional: Negative for fever, chills and diaphoresis.       Gradual weight loss over the past 3 years. Stable in the past 2 months, # 116 Ibs.   HENT: Negative for congestion, hearing loss and sore throat.   Eyes: Negative for pain, discharge and redness.  Respiratory: Positive for cough. Negative for shortness of breath and wheezing.   Cardiovascular: Positive for leg swelling (trace in LLE). Negative for chest pain and palpitations.       Left leg pain after walking a certain amount of distance.   Gastrointestinal: Negative for nausea, vomiting, abdominal pain, diarrhea, constipation and blood in stool.  Endocrine: Negative for polydipsia.  Genitourinary: Positive for frequency. Negative for dysuria, urgency, hematuria and flank pain.  Musculoskeletal: Negative for myalgias, back pain and neck pain.  Skin: Negative for rash.  Allergic/Immunologic: Negative for environmental allergies.  Neurological: Negative for dizziness, tremors, seizures, weakness and headaches.  Hematological: Does not bruise/bleed easily.  Psychiatric/Behavioral: Negative for hallucinations. The patient is nervous/anxious.     Health Maintenance  Topic Date Due  . TETANUS/TDAP  05/28/1953  . ZOSTAVAX  05/28/1994  . PNA vac Low Risk Adult (1 of 2 - PCV13) 05/29/1999  . INFLUENZA VACCINE  06/13/2016 (Originally 04/29/2016)  . DEXA SCAN  Completed    Physical Exam: Filed Vitals:   01/17/16 1350  Pulse: 80  Temp: 97.8 F (36.6 C)  TempSrc: Oral  Height: 5\' 4"  (1.626 m)  Weight: 118 lb 3.2 oz (53.615 kg)  SpO2: 98%   Body mass index is 20.28 kg/(m^2). Physical Exam  Constitutional: She is oriented to person, place, and time. She appears well-developed and well-nourished. No distress.  HENT:  Head: Normocephalic and atraumatic.  Mouth/Throat: No oropharyngeal exudate.  Eyes: Conjunctivae and EOM are  normal. Pupils are equal, round, and reactive to light. No scleral icterus.  Neck: Normal range of motion. Neck supple. No JVD present. No thyromegaly present.  Cardiovascular: Normal rate, regular rhythm and normal heart sounds.   No murmur heard. Left leg claudication   Pulmonary/Chest: Effort normal and breath sounds normal. No respiratory distress. She has no wheezes. She has no rales.  Abdominal: Soft. Bowel sounds are normal. She exhibits no distension. There is no tenderness.  Musculoskeletal: Normal range of motion. She exhibits edema (trace LLE). She exhibits no tenderness.  Lymphadenopathy:    She has no cervical adenopathy.  Neurological: She is alert and oriented to person, place, and time. She displays normal reflexes. No cranial nerve deficit. She exhibits normal muscle tone. Coordination normal.  Skin: She is not diaphoretic.  Left medial thigh surgical scar  Psychiatric: Her mood appears anxious. Her affect is blunt, labile and inappropriate. Her affect is not angry. Her speech is rapid and/or pressured. Her speech is not delayed, not tangential and not slurred. She is hyperactive. She is not agitated, not aggressive, not slowed, not withdrawn, not actively hallucinating and not combative. Thought content is not paranoid and not delusional. Cognition and memory are impaired. She expresses impulsivity and inappropriate judgment. She does not exhibit a depressed mood. She is communicative. She exhibits abnormal recent memory. She exhibits normal remote memory. She is attentive.    Labs reviewed: Basic Metabolic Panel:  Recent Labs  07/17/15 08/20/15 1009  NA 141 142  K 4.0 4.3  CL  --  102  CO2  --  25  GLUCOSE  --  103*  BUN 13 15  CREATININE 1.0 1.01*  CALCIUM  --  10.1  TSH 2.07  --    Liver Function Tests:  Recent Labs  07/17/15 08/20/15 1009  AST 15 11  ALT 8 7  ALKPHOS 101 100  BILITOT  --  0.2  PROT  --  6.9  ALBUMIN  --  4.3   No results for input(s):  LIPASE, AMYLASE in the last 8760 hours. No results for input(s): AMMONIA in the last 8760 hours. CBC:  Recent Labs  07/17/15 08/20/15 1009  WBC 8.8 9.6  NEUTROABS  --  7.4*  HGB 14.6  --   HCT 44 43.7  MCV  --  89  PLT 283 275   Lipid Panel: No results for input(s): CHOL, HDL, LDLCALC, TRIG, CHOLHDL, LDLDIRECT in the last 8760 hours. No results found for: HGBA1C  Procedures since last visit: No results found.  Assessment/Plan  Acute exacerbation of chronic obstructive pulmonary disease (COPD) Chronic smoker, diffused rhonchi, cough, no plan of cessation.   Anxiety she stated she is able to sleep/rest at night. off Mirtazapine at 7.5mg  per the patient. Eats better and weights are stable.   Generalized convulsive epilepsy ([place]) No seizure activities since Jan 2013, takes Zonisamide 100mg  I in am, II in pm, Lamictal 150mg  I am and II pm. Saw Neurology 02/14/13--no medication changes.    Hypertension Permissive blood pressure control,  Continue Metoprolol 50mg  and Diovan 160mg    Loss of weight Weight stable.   PVD (peripheral vascular disease) Stable, trace edema in the left lower leg since the melanoma surgery of the left thigh.     Labs/tests ordered:  @ORDERS @ CBC, CMP, TSH prior to next appointment Next appt:  Visit date not found

## 2016-01-17 NOTE — Assessment & Plan Note (Signed)
Weight stable.

## 2016-01-17 NOTE — Assessment & Plan Note (Signed)
No seizure activities since Jan 2013, takes Zonisamide 100mg  I in am, II in pm, Lamictal 150mg  I am and II pm. Saw Neurology 02/14/13--no medication changes.

## 2016-01-17 NOTE — Assessment & Plan Note (Signed)
Chronic smoker, diffused rhonchi, cough, no plan of cessation.

## 2016-01-17 NOTE — Assessment & Plan Note (Signed)
Permissive blood pressure control,  Continue Metoprolol 50mg and Diovan 160mg.  

## 2016-01-17 NOTE — Assessment & Plan Note (Signed)
Stable, trace edema in the left lower leg since the melanoma surgery of the left thigh.   

## 2016-05-14 ENCOUNTER — Other Ambulatory Visit: Payer: Self-pay | Admitting: Nurse Practitioner

## 2016-05-14 NOTE — Telephone Encounter (Signed)
Jessica [name] Jessica [name] 

## 2016-06-10 DIAGNOSIS — H0011 Chalazion right upper eyelid: Secondary | ICD-10-CM | POA: Diagnosis not present

## 2016-06-10 DIAGNOSIS — Z961 Presence of intraocular lens: Secondary | ICD-10-CM | POA: Diagnosis not present

## 2016-06-10 DIAGNOSIS — Z0100 Encounter for examination of eyes and vision without abnormal findings: Secondary | ICD-10-CM | POA: Diagnosis not present

## 2016-07-10 ENCOUNTER — Other Ambulatory Visit: Payer: Self-pay | Admitting: Nurse Practitioner

## 2016-07-11 ENCOUNTER — Other Ambulatory Visit: Payer: Self-pay | Admitting: Adult Health

## 2016-08-13 ENCOUNTER — Other Ambulatory Visit: Payer: Self-pay | Admitting: Internal Medicine

## 2016-08-19 ENCOUNTER — Ambulatory Visit: Payer: Medicare Other | Admitting: Neurology

## 2016-08-25 ENCOUNTER — Encounter: Payer: Self-pay | Admitting: Neurology

## 2016-08-25 ENCOUNTER — Ambulatory Visit (INDEPENDENT_AMBULATORY_CARE_PROVIDER_SITE_OTHER): Payer: Medicare Other | Admitting: Neurology

## 2016-08-25 VITALS — BP 127/75 | HR 74 | Ht 64.0 in | Wt 113.0 lb

## 2016-08-25 DIAGNOSIS — G40309 Generalized idiopathic epilepsy and epileptic syndromes, not intractable, without status epilepticus: Secondary | ICD-10-CM | POA: Diagnosis not present
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
## 2016-09-24 ENCOUNTER — Other Ambulatory Visit: Payer: Self-pay | Admitting: *Deleted

## 2016-09-24 DIAGNOSIS — I10 Essential (primary) hypertension: Secondary | ICD-10-CM

## 2016-09-24 DIAGNOSIS — E038 Other specified hypothyroidism: Secondary | ICD-10-CM

## 2016-09-26 ENCOUNTER — Other Ambulatory Visit: Payer: Self-pay

## 2016-09-26 DIAGNOSIS — I10 Essential (primary) hypertension: Secondary | ICD-10-CM

## 2016-09-26 DIAGNOSIS — E038 Other specified hypothyroidism: Secondary | ICD-10-CM

## 2016-09-30 DIAGNOSIS — E038 Other specified hypothyroidism: Secondary | ICD-10-CM | POA: Diagnosis not present

## 2016-09-30 DIAGNOSIS — I10 Essential (primary) hypertension: Secondary | ICD-10-CM | POA: Diagnosis not present

## 2016-09-30 LAB — CBC WITH DIFFERENTIAL/PLATELET
BASOS PCT: 1 %
Basophils Absolute: 92 cells/uL (ref 0–200)
EOS PCT: 3 %
Eosinophils Absolute: 276 cells/uL (ref 15–500)
HCT: 44.7 % (ref 35.0–45.0)
Hemoglobin: 14.3 g/dL (ref 11.7–15.5)
LYMPHS PCT: 31 %
Lymphs Abs: 2852 cells/uL (ref 850–3900)
MCH: 29.2 pg (ref 27.0–33.0)
MCHC: 32 g/dL (ref 32.0–36.0)
MCV: 91.4 fL (ref 80.0–100.0)
MONOS PCT: 10 %
MPV: 10.2 fL (ref 7.5–12.5)
Monocytes Absolute: 920 cells/uL (ref 200–950)
NEUTROS ABS: 5060 {cells}/uL (ref 1500–7800)
Neutrophils Relative %: 55 %
PLATELETS: 301 10*3/uL (ref 140–400)
RBC: 4.89 MIL/uL (ref 3.80–5.10)
RDW: 14.5 % (ref 11.0–15.0)
WBC: 9.2 10*3/uL (ref 3.8–10.8)

## 2016-09-30 LAB — COMPREHENSIVE METABOLIC PANEL
ALBUMIN: 4.1 g/dL (ref 3.6–5.1)
ALK PHOS: 87 U/L (ref 33–130)
ALT: 7 U/L (ref 6–29)
AST: 12 U/L (ref 10–35)
BILIRUBIN TOTAL: 0.3 mg/dL (ref 0.2–1.2)
BUN: 16 mg/dL (ref 7–25)
CALCIUM: 9.5 mg/dL (ref 8.6–10.4)
CO2: 25 mmol/L (ref 20–31)
CREATININE: 1.16 mg/dL — AB (ref 0.60–0.88)
Chloride: 109 mmol/L (ref 98–110)
Glucose, Bld: 112 mg/dL — ABNORMAL HIGH (ref 65–99)
Potassium: 4.1 mmol/L (ref 3.5–5.3)
Sodium: 142 mmol/L (ref 135–146)
TOTAL PROTEIN: 6.9 g/dL (ref 6.1–8.1)

## 2016-09-30 LAB — TSH: TSH: 2.17 m[IU]/L

## 2016-10-08 ENCOUNTER — Other Ambulatory Visit: Payer: Self-pay | Admitting: Nurse Practitioner

## 2016-10-09 ENCOUNTER — Encounter: Payer: Medicare Other | Admitting: Nurse Practitioner

## 2016-10-23 ENCOUNTER — Non-Acute Institutional Stay: Payer: Medicare Other | Admitting: Nurse Practitioner

## 2016-10-23 ENCOUNTER — Encounter: Payer: Self-pay | Admitting: Nurse Practitioner

## 2016-10-23 DIAGNOSIS — J449 Chronic obstructive pulmonary disease, unspecified: Secondary | ICD-10-CM

## 2016-10-23 DIAGNOSIS — I10 Essential (primary) hypertension: Secondary | ICD-10-CM | POA: Diagnosis not present

## 2016-10-23 DIAGNOSIS — I739 Peripheral vascular disease, unspecified: Secondary | ICD-10-CM | POA: Diagnosis not present

## 2016-10-23 DIAGNOSIS — G40309 Generalized idiopathic epilepsy and epileptic syndromes, not intractable, without status epilepticus: Secondary | ICD-10-CM

## 2016-10-23 DIAGNOSIS — F419 Anxiety disorder, unspecified: Secondary | ICD-10-CM | POA: Diagnosis not present

## 2016-10-23 DIAGNOSIS — Z720 Tobacco use: Secondary | ICD-10-CM

## 2016-10-23 NOTE — Assessment & Plan Note (Signed)
Chronic cough, smoke cessation reinforced. Coarse rhonchi noted.  

## 2016-10-23 NOTE — Progress Notes (Signed)
Patient ID: Jessica [name], female   DOB: 11/15/1931, 83 y.o.   MRN: [id number]   Location:   [facility]   Place of Service:  Clinic (12)   Provider: Marlana Latus NP  Code Status: DNR  Goals of Care: [place]  Advanced Directives 10/23/2016  Does Patient Have a Medical Advance Directive? Yes  Type of Paramedic of [place];Out of facility DNR (pink MOST or yellow form)  Does patient want to make changes to medical advance directive? No - Patient declined  Copy of [place] in Chart? Yes  Pre-existing out of facility DNR order (yellow form or pink MOST form) Yellow form placed in chart (order not valid for inpatient use)     Chief Complaint  Patient presents with  . Acute Visit    strong odor to her urine, no pain when she urinates    HPI: Patient is a 83 y.o. female seen today for medical management of chronic diseases.   Hx of chronic smoker, no plan of cessation. No active seizure activities since last visit, taking Lamictal 150mg  am 200mg  night, Zonegran 100mg  am 200mg  night. Blood pressure is controlled while on Diovan 160mg  and Metoprolol 50mg  daily. Her anxiety and depression are improved as evidenced sleeps and eats better, her weights has been stable.    Past Medical History:  Diagnosis Date  . Alcohol abuse 11/11/2010  . Alcohol abuse, unspecified   . Anxiety   . Atrial fibrillation ([place]) 11/11/2010  . Bone infection of left hand ([place]) 12/22/2008   secondary to injury  . Cancer ([place])    colon cancer-hx of chemo  . Chronic airway obstruction, not elsewhere classified 10/01/2011  . Confusion   . COPD (chronic obstructive pulmonary disease) ([place])   . Dizziness and giddiness 04/24/2005  . Edema 12/03/2011  . Embolism - blood clot 1996   left breast  . Generalized convulsive epilepsy without mention of intractable epilepsy   . GERD (gastroesophageal reflux disease)   . Hemorrhoids   . Hyperlipidemia   . Hypertension   . Insomnia,  unspecified 11/11/2010  . Major depressive disorder, single episode, unspecified 11/11/2010  . Malignant neoplasm of stomach, unspecified site 11/11/2010  . Melanoma ([place])    metastatic  . Peripheral vascular disease ([place])   . Seizures ([place])   . Symptomatic menopausal or female climacteric states 11/11/2010  . Tachycardia, unspecified   . Tobacco use disorder   . Urgency of urination     Past Surgical History:  Procedure Laterality Date  . ABDOMINAL HYSTERECTOMY  1963  . BLADDER REPAIR    . breast biopsies     x2 on left, x 1 on right  . c-sections  1957, 1959, 1961   x3  . COLON RESECTION  1995  . COLONOSCOPY  10/21/2012   Dr. Paulita Fujita diverticulosis, sessile polyps biopsy   . heart ablation  1997  . HEMORRHOID SURGERY    . laser surgery to right eye  10/04/2007  . left femoral artery stent  05/07/2004  . melanoma removed from left groin  09/2004  . melanoma removed from left leg  09/06/2004  . orif left humerus fracture  03/27/2005  . porta cath placement  1995  . porta cath removal  1996  . removal of tumor in left knee  11/15/2010  . skin graft to left leg  12/05/2004  . VOCAL CORD INJECTION  1996, 1997    Allergies  Allergen Reactions  . Aspirin Nausea And Vomiting  .  Effexor [Venlafaxine Hcl]     Seizure  . Keflex [Cephalexin]   . Venlafaxine Other (See Comments)    seizures  . Penicillins Rash  . Zithromax [Azithromycin] Rash    Allergies as of 10/23/2016      Reactions   Aspirin Nausea And Vomiting   Effexor [venlafaxine Hcl]    Seizure   Keflex [cephalexin]    Venlafaxine Other (See Comments)   seizures   Penicillins Rash   Zithromax [azithromycin] Rash      Medication List       Accurate as of 10/23/16  2:54 PM. Always use your most recent med list.          LAMICTAL 150 MG tablet Generic drug:  lamoTRIgine Take 1 tablet (150 mg total) by mouth every morning.   LAMICTAL 200 MG tablet Generic drug:  lamoTRIgine Take 1 tablet (200 mg total) by mouth at  bedtime.   metoprolol succinate 50 MG 24 hr tablet Commonly known as:  TOPROL-XL TAKE ONE TABLET BY MOUTH DAILY   valsartan 160 MG tablet Commonly known as:  DIOVAN TAKE ONE TABLET BY MOUTH DAILY FOR BLOOD PRESSURE   zonisamide 100 MG capsule Commonly known as:  ZONEGRAN TAKE ONE CAPSULE  IN THE MORNING  AND 2 CAPSULES  IN THE EVENING EACH DAY FOR SEIZURE       Review of Systems:  Review of Systems  Constitutional: Negative for chills, diaphoresis and fever.       Gradual weight loss over the past 3 years. Stable in the past 2 months, # 116 Ibs.   HENT: Negative for congestion, hearing loss and sore throat.   Eyes: Negative for pain, discharge and redness.  Respiratory: Positive for cough. Negative for shortness of breath and wheezing.   Cardiovascular: Positive for leg swelling (trace in LLE). Negative for chest pain and palpitations.       Left leg pain after walking a certain amount of distance.   Gastrointestinal: Negative for abdominal pain, blood in stool, constipation, diarrhea, nausea and vomiting.  Endocrine: Negative for polydipsia.  Genitourinary: Positive for frequency. Negative for dysuria, flank pain, hematuria and urgency.  Musculoskeletal: Negative for back pain, myalgias and neck pain.  Skin: Negative for rash.  Allergic/Immunologic: Negative for environmental allergies.  Neurological: Negative for dizziness, tremors, seizures, weakness and headaches.  Hematological: Does not bruise/bleed easily.  Psychiatric/Behavioral: Negative for hallucinations. The patient is nervous/anxious.     Health Maintenance  Topic Date Due  . TETANUS/TDAP  05/28/1953  . ZOSTAVAX  05/28/1994  . PNA vac Low Risk Adult (1 of 2 - PCV13) 05/29/1999  . INFLUENZA VACCINE  04/29/2016  . DEXA SCAN  Completed    Physical Exam: Vitals:   10/23/16 1401  BP: 140/80  Pulse: (!) 57  Resp: 20  Temp: 97.9 F (36.6 C)  TempSrc: Oral  Weight: 112 lb (50.8 kg)  Height: 5\' 7"  (1.702 m)    Body mass index is 17.54 kg/m. Physical Exam  Constitutional: She is oriented to person, place, and time. She appears well-developed and well-nourished. No distress.  HENT:  Head: Normocephalic and atraumatic.  Mouth/Throat: No oropharyngeal exudate.  Eyes: Conjunctivae and EOM are normal. Pupils are equal, round, and reactive to light. No scleral icterus.  Neck: Normal range of motion. Neck supple. No JVD present. No thyromegaly present.  Cardiovascular: Normal rate, regular rhythm and normal heart sounds.   No murmur heard. Left leg claudication   Pulmonary/Chest: Effort normal and breath sounds normal.  No respiratory distress. She has no wheezes. She has no rales.  Abdominal: Soft. Bowel sounds are normal. She exhibits no distension. There is no tenderness.  Genitourinary:  Genitourinary Comments: Odorous urine  Musculoskeletal: Normal range of motion. She exhibits edema (trace LLE). She exhibits no tenderness.  Lymphadenopathy:    She has no cervical adenopathy.  Neurological: She is alert and oriented to person, place, and time. She displays normal reflexes. No cranial nerve deficit. She exhibits normal muscle tone. Coordination normal.  Skin: She is not diaphoretic.  Left medial thigh surgical scar  Psychiatric: Her mood appears anxious. Her affect is blunt, labile and inappropriate. Her affect is not angry. Her speech is rapid and/or pressured. Her speech is not delayed, not tangential and not slurred. She is hyperactive. She is not agitated, not aggressive, not slowed, not withdrawn, not actively hallucinating and not combative. Thought content is not paranoid and not delusional. Cognition and memory are impaired. She expresses impulsivity and inappropriate judgment. She does not exhibit a depressed mood. She is communicative. She exhibits abnormal recent memory. She exhibits normal remote memory. She is attentive.    Labs reviewed: Basic Metabolic Panel:  Recent Labs   09/30/16 0650  NA 142  K 4.1  CL 109  CO2 25  GLUCOSE 112*  BUN 16  CREATININE 1.16*  CALCIUM 9.5  TSH 2.17   Liver Function Tests:  Recent Labs  09/30/16 0650  AST 12  ALT 7  ALKPHOS 87  BILITOT 0.3  PROT 6.9  ALBUMIN 4.1   No results for input(s): LIPASE, AMYLASE in the last 8760 hours. No results for input(s): AMMONIA in the last 8760 hours. CBC:  Recent Labs  09/30/16 0650  WBC 9.2  NEUTROABS 5,060  HGB 14.3  HCT 44.7  MCV 91.4  PLT 301   Lipid Panel: No results for input(s): CHOL, HDL, LDLCALC, TRIG, CHOLHDL, LDLDIRECT in the last 8760 hours. No results found for: HGBA1C  Procedures since last visit: No results found.  Assessment/Plan  Hypertension Controlled, Continue Metoprolol 50mg  and Diovan 160mg     Peripheral vascular disease Stable, trace edema in the left lower leg since the melanoma surgery of the left thigh.    COPD (chronic obstructive pulmonary disease) (HCC) Chronic cough, smoke cessation reinforced. Coarse rhonchi noted.    Generalized convulsive epilepsy ([place]) No seizure activities since Jan 2013, takes Zonisamide 100mg  I in am, II in pm, Lamictal 150mg  I am and II pm. Saw Neurology 07/2016--no medication changes. C/o of odorous urine, denied dysuria.    Tobacco abuse Willing to reduce the amount to stop it  Anxiety Stable.    Labs/tests ordered: 09/30/16 CBC CMP TSH done  Next appt:  Visit date not found

## 2016-10-23 NOTE — Assessment & Plan Note (Signed)
Stable

## 2016-10-23 NOTE — Assessment & Plan Note (Addendum)
No seizure activities since Jan 2013, takes Zonisamide 100mg  I in am, II in pm, Lamictal 150mg  I am and II pm. Saw Neurology 07/2016--no medication changes. C/o of odorous urine, denied dysuria.

## 2016-10-23 NOTE — Assessment & Plan Note (Signed)
Controlled, Continue Metoprolol 50mg  and Diovan 160mg 

## 2016-10-23 NOTE — Assessment & Plan Note (Signed)
Stable, trace edema in the left lower leg since the melanoma surgery of the left thigh.

## 2016-10-23 NOTE — Assessment & Plan Note (Signed)
Willing to reduce the amount to stop it

## 2016-11-20 ENCOUNTER — Other Ambulatory Visit: Payer: Self-pay | Admitting: Neurology

## 2016-11-20 ENCOUNTER — Other Ambulatory Visit: Payer: Self-pay | Admitting: *Deleted

## 2016-11-24 ENCOUNTER — Other Ambulatory Visit: Payer: Self-pay | Admitting: Neurology

## 2016-11-24 ENCOUNTER — Other Ambulatory Visit: Payer: Self-pay | Admitting: *Deleted

## 2016-11-24 MED ORDER — LAMICTAL 200 MG PO TABS: 200.0000 mg | ORAL_TABLET | Freq: Every day | ORAL | 11 refills | Status: DC

## 2016-11-24 MED ORDER — LAMICTAL 150 MG PO TABS: 150.0000 mg | ORAL_TABLET | Freq: Every morning | ORAL | 11 refills | Status: DC

## 2016-12-03 ENCOUNTER — Other Ambulatory Visit: Payer: Self-pay | Admitting: *Deleted

## 2016-12-03 ENCOUNTER — Telehealth: Payer: Self-pay | Admitting: Neurology

## 2016-12-03 MED ORDER — LAMICTAL 150 MG PO TABS: 150.0000 mg | ORAL_TABLET | Freq: Every morning | ORAL | 3 refills | Status: DC

## 2016-12-03 MED ORDER — LAMICTAL 200 MG PO TABS: 200.0000 mg | ORAL_TABLET | Freq: Every day | ORAL | 3 refills | Status: DC

## 2016-12-03 NOTE — Telephone Encounter (Signed)
90-day supply for both strengths faxed and confirmed to [facility].

## 2016-12-03 NOTE — Telephone Encounter (Signed)
Estill Bamberg with [facility] is calling in reference to patient requesting a 90 day supply for LAMICTAL 150 MG tablet and LAMICTAL 200 MG tablet.  Please call

## 2016-12-24 ENCOUNTER — Other Ambulatory Visit: Payer: Self-pay | Admitting: *Deleted

## 2016-12-24 MED ORDER — LAMICTAL 150 MG PO TABS: 150.0000 mg | ORAL_TABLET | ORAL | 3 refills | Status: DC

## 2016-12-24 MED ORDER — LAMICTAL 200 MG PO TABS
200.0000 mg | ORAL_TABLET | Freq: Every day | ORAL | 3 refills | Status: DC
Start: 2016-12-24 — End: 2017-11-12

## 2016-12-24 NOTE — Telephone Encounter (Signed)
Her Lamictal prescriptions have been faxed and confirmed to [facility].  She gets her generic Zonegran from [facility].  Called patient and she is aware this has been taken care of for her.

## 2016-12-24 NOTE — Telephone Encounter (Signed)
Pt called request new RX for both meds sent to [facility] @ [place] (p) [phone]. She is wanting refill to last thru the end of the year.

## 2017-02-11 ENCOUNTER — Other Ambulatory Visit: Payer: Self-pay | Admitting: Nurse Practitioner

## 2017-04-20 ENCOUNTER — Other Ambulatory Visit: Payer: Self-pay | Admitting: Nurse Practitioner

## 2017-04-24 ENCOUNTER — Other Ambulatory Visit: Payer: Self-pay

## 2017-05-06 ENCOUNTER — Other Ambulatory Visit: Payer: Self-pay | Admitting: Adult Health

## 2017-05-07 ENCOUNTER — Telehealth: Payer: Self-pay | Admitting: *Deleted

## 2017-05-07 NOTE — Telephone Encounter (Signed)
Received fax from [facility] stating Lamictal 150mg  tablet needing PA. I called [facility] and spoke with [place]. She states request started yesterday. Unsure if patient requested refill or not.  I called pt. She stated she went and asked for refill for zonisamide from [facility] yesterday, not Lamictal. But she verified she will need refill soon for Lamictal.  I verified she is receiving rx from patient assistance. I recommended she call [place] patient assistance to see if they can send refills. We sent rx back on 12/24/16 qty 90, 3 refills. She should have refills. She is going to call [phone]. She will call back if she has further questions or concerns.   Faxed notice to [facility] that pt receiving rx from patient assistance.

## 2017-05-11 ENCOUNTER — Other Ambulatory Visit: Payer: Self-pay | Admitting: *Deleted

## 2017-05-11 DIAGNOSIS — I10 Essential (primary) hypertension: Secondary | ICD-10-CM

## 2017-05-11 MED ORDER — LOSARTAN POTASSIUM 100 MG PO TABS: 100.0000 mg | ORAL_TABLET | Freq: Every day | ORAL | 0 refills | Status: DC

## 2017-05-11 NOTE — Progress Notes (Signed)
Patient left note stating that her medication Valsartan had been recalled, she was concerned that she didn't have a medication to replace it. Spoke to patient by telephone, informed her that [place] NP has sent a new medication to the pharmacy and that she would like for her to keep a log of BP readings, bring to appointment on 05/14/2017 @ 2:00pm

## 2017-05-14 ENCOUNTER — Encounter: Payer: Self-pay | Admitting: Nurse Practitioner

## 2017-05-14 ENCOUNTER — Non-Acute Institutional Stay: Payer: Medicare Other | Admitting: Nurse Practitioner

## 2017-05-14 DIAGNOSIS — G40309 Generalized idiopathic epilepsy and epileptic syndromes, not intractable, without status epilepticus: Secondary | ICD-10-CM

## 2017-05-14 DIAGNOSIS — Z720 Tobacco use: Secondary | ICD-10-CM

## 2017-05-14 DIAGNOSIS — I10 Essential (primary) hypertension: Secondary | ICD-10-CM

## 2017-05-14 NOTE — Assessment & Plan Note (Signed)
Gradual reduction.

## 2017-05-14 NOTE — Assessment & Plan Note (Signed)
Controlled, Continue Metoprolol 50mg , dc Diovan 160mg  when current supplies exhausted, will start Losartan 100mg  daily, monitor blood pressure

## 2017-05-14 NOTE — Progress Notes (Signed)
trtPatient ID: Jessica [name], female   DOB: Mar 22, 1932, 83 y.o.   MRN: [id number]   Location:   [facility]   Place of Service:  Clinic (12)   Provider: Marlana Latus NP  Code Status: DNR  Goals of Care: [place]  Advanced Directives 05/14/2017  Does Patient Have a Medical Advance Directive? Yes  Type of Paramedic of [place];Living will;Out of facility DNR (pink MOST or yellow form)  Does patient want to make changes to medical advance directive? No - Patient declined  Copy of [place] in Chart? Yes  Pre-existing out of facility DNR order (yellow form or pink MOST form) Yellow form placed in chart (order not valid for inpatient use)     Chief Complaint  Patient presents with  . Medical Management of Chronic Issues    HPI: Patient is a 83 y.o. female seen today for evaluation of blood pressure, recently Latrelle Dodrill was recalled, she needs to switch to a different ARBs for blood pressure control. She is a chronic, now 1/2 pack a day(was 2 packs a day). No active seizure activities since last visit, taking Lamictal 150mg  am 200mg  night, Zonegran 100mg  am 200mg  night. Blood pressure is controlled while on Diovan 160mg  and Metoprolol 50mg  daily.   Past Medical History:  Diagnosis Date  . Alcohol abuse 11/11/2010  . Alcohol abuse, unspecified   . Anxiety   . Atrial fibrillation ([place]) 11/11/2010  . Bone infection of left hand ([place]) 12/22/2008   secondary to injury  . Cancer ([place])    colon cancer-hx of chemo  . Chronic airway obstruction, not elsewhere classified 10/01/2011  . Confusion   . COPD (chronic obstructive pulmonary disease) ([place])   . Dizziness and giddiness 04/24/2005  . Edema 12/03/2011  . Embolism - blood clot 1996   left breast  . Generalized convulsive epilepsy without mention of intractable epilepsy   . GERD (gastroesophageal reflux disease)   . Hemorrhoids   . Hyperlipidemia   . Hypertension   . Insomnia, unspecified 11/11/2010  .  Major depressive disorder, single episode, unspecified 11/11/2010  . Malignant neoplasm of stomach, unspecified site 11/11/2010  . Melanoma ([place])    metastatic  . Peripheral vascular disease ([place])   . Seizures ([place])   . Symptomatic menopausal or female climacteric states 11/11/2010  . Tachycardia, unspecified   . Tobacco use disorder   . Urgency of urination     Past Surgical History:  Procedure Laterality Date  . ABDOMINAL HYSTERECTOMY  1963  . BLADDER REPAIR    . breast biopsies     x2 on left, x 1 on right  . c-sections  1957, 1959, 1961   x3  . COLON RESECTION  1995  . COLONOSCOPY  10/21/2012   Dr. Paulita Fujita diverticulosis, sessile polyps biopsy   . heart ablation  1997  . HEMORRHOID SURGERY    . laser surgery to right eye  10/04/2007  . left femoral artery stent  05/07/2004  . melanoma removed from left groin  09/2004  . melanoma removed from left leg  09/06/2004  . orif left humerus fracture  03/27/2005  . porta cath placement  1995  . porta cath removal  1996  . removal of tumor in left knee  11/15/2010  . skin graft to left leg  12/05/2004  . VOCAL CORD INJECTION  1996, 1997    Allergies  Allergen Reactions  . Aspirin Nausea And Vomiting  . Effexor [Venlafaxine Hcl]  Seizure  . Keflex [Cephalexin]   . Venlafaxine Other (See Comments)    seizures  . Penicillins Rash  . Zithromax [Azithromycin] Rash    Allergies as of 05/14/2017      Reactions   Aspirin Nausea And Vomiting   Effexor [venlafaxine Hcl]    Seizure   Keflex [cephalexin]    Venlafaxine Other (See Comments)   seizures   Penicillins Rash   Zithromax [azithromycin] Rash      Medication List       Accurate as of 05/14/17  2:56 PM. Always use your most recent med list.          LAMICTAL 150 MG tablet Generic drug:  lamoTRIgine Take 1 tablet (150 mg total) by mouth every morning.   LAMICTAL 200 MG tablet Generic drug:  lamoTRIgine Take 1 tablet (200 mg total) by mouth at bedtime.   losartan 100  MG tablet Commonly known as:  COZAAR Take 1 tablet (100 mg total) by mouth daily.   metoprolol succinate 50 MG 24 hr tablet Commonly known as:  TOPROL-XL TAKE 1 TABLET BY MOUTH DAILY   zonisamide 100 MG capsule Commonly known as:  ZONEGRAN TAKE ONE CAPSULE BY MOUTH EVERY MORNING AND TAKE TWO CAPSULES BY MOUTH EVERY EVENING FOR SEIZURE CONTROL       Review of Systems:  Review of Systems  Constitutional: Negative for activity change and appetite change.       Now weight about 100Ibs, frail.   HENT: Positive for hearing loss. Negative for congestion.   Respiratory: Positive for cough. Negative for shortness of breath and wheezing.   Cardiovascular: Positive for leg swelling (trace in LLE). Negative for chest pain and palpitations.       Left leg pain after walking a certain amount of distance.   Gastrointestinal: Negative for abdominal distention and abdominal pain.  Genitourinary: Positive for frequency. Negative for dysuria, flank pain, hematuria and urgency.  Skin:       Left inner thigh surgical scar from previous melanoma removal.   Neurological: Positive for numbness. Negative for dizziness, seizures and headaches.       Some numbness in the left leg since the melanoma surgery   Psychiatric/Behavioral: Negative for hallucinations and sleep disturbance.       Admitted she is depressed but stated she is not going to take medication for it.     Health Maintenance  Topic Date Due  . TETANUS/TDAP  05/28/1953  . PNA vac Low Risk Adult (1 of 2 - PCV13) 05/29/1999  . INFLUENZA VACCINE  04/29/2017  . DEXA SCAN  Completed    Physical Exam: Vitals:   05/14/17 1357  BP: 130/68  Pulse: 73  Temp: 98 F (36.7 C)  SpO2: 94%  Height: 5\' 7"  (1.702 m)   There is no height or weight on file to calculate BMI. Physical Exam  Constitutional: She is oriented to person, place, and time. She appears well-developed and well-nourished.  HENT:  Mouth/Throat: No oropharyngeal exudate.    Eyes: Conjunctivae are normal. No scleral icterus.  Cardiovascular: Normal rate, regular rhythm and normal heart sounds.   No murmur heard. Left leg claudication   Pulmonary/Chest: Effort normal and breath sounds normal. No respiratory distress. She has no wheezes. She has no rales.  Abdominal: Soft. Bowel sounds are normal. She exhibits no distension. There is no tenderness.  Musculoskeletal: Normal range of motion. She exhibits no edema (trace LLE) or tenderness.  Neurological: She is alert and oriented to person,  place, and time. She exhibits normal muscle tone.  Skin:  Left medial thigh surgical scar  Psychiatric: Her mood appears anxious. Her affect is blunt, labile and inappropriate. Her affect is not angry. Her speech is rapid and/or pressured. Her speech is not delayed, not tangential and not slurred. She is hyperactive. She is not agitated, not aggressive, not slowed, not withdrawn, not actively hallucinating and not combative. Thought content is not paranoid and not delusional. Cognition and memory are impaired. She expresses impulsivity and inappropriate judgment. She does not exhibit a depressed mood. She is communicative. She exhibits abnormal recent memory. She exhibits normal remote memory. She is attentive.    Labs reviewed: Basic Metabolic Panel:  Recent Labs  09/30/16 0650  NA 142  K 4.1  CL 109  CO2 25  GLUCOSE 112*  BUN 16  CREATININE 1.16*  CALCIUM 9.5  TSH 2.17   Liver Function Tests:  Recent Labs  09/30/16 0650  AST 12  ALT 7  ALKPHOS 87  BILITOT 0.3  PROT 6.9  ALBUMIN 4.1   No results for input(s): LIPASE, AMYLASE in the last 8760 hours. No results for input(s): AMMONIA in the last 8760 hours. CBC:  Recent Labs  09/30/16 0650  WBC 9.2  NEUTROABS 5,060  HGB 14.3  HCT 44.7  MCV 91.4  PLT 301   Lipid Panel: No results for input(s): CHOL, HDL, LDLCALC, TRIG, CHOLHDL, LDLDIRECT in the last 8760 hours. No results found for:  HGBA1C  Procedures since last visit: No results found.  Assessment/Plan  Hypertension Controlled, Continue Metoprolol 50mg , dc Diovan 160mg  when current supplies exhausted, will start Losartan 100mg  daily, monitor blood pressure    Generalized convulsive epilepsy ([place]) No seizure activities since Jan 2013, takes Zonisamide 100mg  I in am, II in pm, Lamictal 150mg  I am and II pm.  Tobacco abuse Gradual reduction.    Labs/tests ordered: CBC CMP TSH prior to the next appointment.   Next appt:  A year

## 2017-05-14 NOTE — Assessment & Plan Note (Signed)
No seizure activities since Jan 2013, takes Zonisamide 100mg  I in am, II in pm, Lamictal 150mg  I am and II pm.

## 2017-05-14 NOTE — Patient Instructions (Signed)
Labs/tests ordered: CBC CMP TSH prior to the next appointment. Next appt:  A year

## 2017-05-20 ENCOUNTER — Encounter (HOSPITAL_COMMUNITY): Payer: Self-pay | Admitting: Emergency Medicine

## 2017-05-20 ENCOUNTER — Emergency Department (HOSPITAL_COMMUNITY): Payer: Medicare Other

## 2017-05-20 ENCOUNTER — Observation Stay (HOSPITAL_COMMUNITY): Payer: Medicare Other

## 2017-05-20 ENCOUNTER — Inpatient Hospital Stay (HOSPITAL_COMMUNITY)
Admission: EM | Admit: 2017-05-20 | Discharge: 2017-05-22 | DRG: 100 | Disposition: A | Discharge status: Home Health | Payer: Medicare Other | Attending: Internal Medicine | Admitting: Internal Medicine

## 2017-05-20 DIAGNOSIS — R569 Unspecified convulsions: Secondary | ICD-10-CM

## 2017-05-20 DIAGNOSIS — Z720 Tobacco use: Secondary | ICD-10-CM | POA: Diagnosis present

## 2017-05-20 DIAGNOSIS — G40409 Other generalized epilepsy and epileptic syndromes, not intractable, without status epilepticus: Secondary | ICD-10-CM | POA: Diagnosis not present

## 2017-05-20 DIAGNOSIS — F419 Anxiety disorder, unspecified: Secondary | ICD-10-CM | POA: Diagnosis not present

## 2017-05-20 DIAGNOSIS — J449 Chronic obstructive pulmonary disease, unspecified: Secondary | ICD-10-CM | POA: Diagnosis not present

## 2017-05-20 DIAGNOSIS — F329 Major depressive disorder, single episode, unspecified: Secondary | ICD-10-CM | POA: Diagnosis present

## 2017-05-20 DIAGNOSIS — Z90710 Acquired absence of both cervix and uterus: Secondary | ICD-10-CM

## 2017-05-20 DIAGNOSIS — E785 Hyperlipidemia, unspecified: Secondary | ICD-10-CM | POA: Diagnosis not present

## 2017-05-20 DIAGNOSIS — Z85820 Personal history of malignant melanoma of skin: Secondary | ICD-10-CM

## 2017-05-20 DIAGNOSIS — R410 Disorientation, unspecified: Secondary | ICD-10-CM

## 2017-05-20 DIAGNOSIS — I10 Essential (primary) hypertension: Secondary | ICD-10-CM | POA: Diagnosis not present

## 2017-05-20 DIAGNOSIS — Z681 Body mass index (BMI) 19 or less, adult: Secondary | ICD-10-CM

## 2017-05-20 DIAGNOSIS — Z888 Allergy status to other drugs, medicaments and biological substances status: Secondary | ICD-10-CM

## 2017-05-20 DIAGNOSIS — T148XXA Other injury of unspecified body region, initial encounter: Secondary | ICD-10-CM | POA: Diagnosis not present

## 2017-05-20 DIAGNOSIS — I739 Peripheral vascular disease, unspecified: Secondary | ICD-10-CM | POA: Diagnosis present

## 2017-05-20 DIAGNOSIS — R9401 Abnormal electroencephalogram [EEG]: Secondary | ICD-10-CM | POA: Diagnosis present

## 2017-05-20 DIAGNOSIS — I517 Cardiomegaly: Secondary | ICD-10-CM | POA: Diagnosis not present

## 2017-05-20 DIAGNOSIS — G9349 Other encephalopathy: Secondary | ICD-10-CM | POA: Diagnosis present

## 2017-05-20 DIAGNOSIS — E43 Unspecified severe protein-calorie malnutrition: Secondary | ICD-10-CM | POA: Diagnosis present

## 2017-05-20 DIAGNOSIS — I4891 Unspecified atrial fibrillation: Secondary | ICD-10-CM | POA: Diagnosis present

## 2017-05-20 DIAGNOSIS — Z886 Allergy status to analgesic agent status: Secondary | ICD-10-CM

## 2017-05-20 DIAGNOSIS — W19XXXA Unspecified fall, initial encounter: Secondary | ICD-10-CM

## 2017-05-20 DIAGNOSIS — G9340 Encephalopathy, unspecified: Secondary | ICD-10-CM | POA: Diagnosis not present

## 2017-05-20 DIAGNOSIS — R55 Syncope and collapse: Secondary | ICD-10-CM | POA: Diagnosis not present

## 2017-05-20 DIAGNOSIS — K219 Gastro-esophageal reflux disease without esophagitis: Secondary | ICD-10-CM

## 2017-05-20 DIAGNOSIS — S51011A Laceration without foreign body of right elbow, initial encounter: Secondary | ICD-10-CM | POA: Diagnosis not present

## 2017-05-20 DIAGNOSIS — Z9221 Personal history of antineoplastic chemotherapy: Secondary | ICD-10-CM

## 2017-05-20 DIAGNOSIS — F418 Other specified anxiety disorders: Secondary | ICD-10-CM | POA: Diagnosis present

## 2017-05-20 DIAGNOSIS — Z9049 Acquired absence of other specified parts of digestive tract: Secondary | ICD-10-CM

## 2017-05-20 DIAGNOSIS — F1010 Alcohol abuse, uncomplicated: Secondary | ICD-10-CM | POA: Diagnosis present

## 2017-05-20 DIAGNOSIS — R627 Adult failure to thrive: Secondary | ICD-10-CM | POA: Diagnosis present

## 2017-05-20 DIAGNOSIS — Z66 Do not resuscitate: Secondary | ICD-10-CM | POA: Diagnosis present

## 2017-05-20 DIAGNOSIS — Z85038 Personal history of other malignant neoplasm of large intestine: Secondary | ICD-10-CM

## 2017-05-20 DIAGNOSIS — Z85028 Personal history of other malignant neoplasm of stomach: Secondary | ICD-10-CM

## 2017-05-20 DIAGNOSIS — Z880 Allergy status to penicillin: Secondary | ICD-10-CM

## 2017-05-20 DIAGNOSIS — I119 Hypertensive heart disease without heart failure: Secondary | ICD-10-CM | POA: Diagnosis present

## 2017-05-20 DIAGNOSIS — E876 Hypokalemia: Secondary | ICD-10-CM | POA: Diagnosis present

## 2017-05-20 DIAGNOSIS — Z79899 Other long term (current) drug therapy: Secondary | ICD-10-CM

## 2017-05-20 DIAGNOSIS — F17210 Nicotine dependence, cigarettes, uncomplicated: Secondary | ICD-10-CM | POA: Diagnosis present

## 2017-05-20 DIAGNOSIS — Z881 Allergy status to other antibiotic agents status: Secondary | ICD-10-CM

## 2017-05-20 LAB — COMPREHENSIVE METABOLIC PANEL
ALK PHOS: 82 U/L (ref 38–126)
ALT: 12 U/L — AB (ref 14–54)
AST: 18 U/L (ref 15–41)
Albumin: 3.9 g/dL (ref 3.5–5.0)
Anion gap: 9 (ref 5–15)
BUN: 17 mg/dL (ref 6–20)
CALCIUM: 9.5 mg/dL (ref 8.9–10.3)
CO2: 22 mmol/L (ref 22–32)
CREATININE: 1.13 mg/dL — AB (ref 0.44–1.00)
Chloride: 109 mmol/L (ref 101–111)
GFR, EST AFRICAN AMERICAN: 51 mL/min — AB (ref >60)
GFR, EST NON AFRICAN AMERICAN: 44 mL/min — AB (ref >60)
Glucose, Bld: 105 mg/dL — ABNORMAL HIGH (ref 65–99)
Potassium: 3.8 mmol/L (ref 3.5–5.1)
SODIUM: 140 mmol/L (ref 135–145)
TOTAL PROTEIN: 6.8 g/dL (ref 6.5–8.1)
Total Bilirubin: 0.6 mg/dL (ref 0.3–1.2)

## 2017-05-20 LAB — CBC WITH DIFFERENTIAL/PLATELET
BASOS ABS: 0.1 10*3/uL (ref 0.0–0.1)
Basophils Relative: 1 %
EOS PCT: 0 %
Eosinophils Absolute: 0 10*3/uL (ref 0.0–0.7)
HCT: 42.1 % (ref 36.0–46.0)
Hemoglobin: 13.7 g/dL (ref 12.0–15.0)
Lymphocytes Relative: 11 %
Lymphs Abs: 1 10*3/uL (ref 0.7–4.0)
MCH: 29 pg (ref 26.0–34.0)
MCHC: 32.5 g/dL (ref 30.0–36.0)
MCV: 89 fL (ref 78.0–100.0)
Monocytes Absolute: 0.7 10*3/uL (ref 0.1–1.0)
Monocytes Relative: 8 %
Neutro Abs: 7 10*3/uL (ref 1.7–7.7)
Neutrophils Relative %: 80 %
PLATELETS: 234 10*3/uL (ref 150–400)
RBC: 4.73 MIL/uL (ref 3.87–5.11)
RDW: 14.4 % (ref 11.5–15.5)
WBC: 8.8 10*3/uL (ref 4.0–10.5)

## 2017-05-20 LAB — URINALYSIS, ROUTINE W REFLEX MICROSCOPIC
Bilirubin Urine: NEGATIVE
GLUCOSE, UA: NEGATIVE mg/dL
Hgb urine dipstick: NEGATIVE
Ketones, ur: 20 mg/dL — AB
Leukocytes, UA: NEGATIVE
Nitrite: NEGATIVE
PROTEIN: 30 mg/dL — AB
SPECIFIC GRAVITY, URINE: 1.015 (ref 1.005–1.030)
pH: 6 (ref 5.0–8.0)

## 2017-05-20 LAB — ETHANOL

## 2017-05-20 LAB — HEPATIC FUNCTION PANEL
ALBUMIN: 3.9 g/dL (ref 3.5–5.0)
ALT: 12 U/L — ABNORMAL LOW (ref 14–54)
AST: 19 U/L (ref 15–41)
Alkaline Phosphatase: 85 U/L (ref 38–126)
Bilirubin, Direct: 0.1 mg/dL (ref 0.1–0.5)
Indirect Bilirubin: 0.7 mg/dL (ref 0.3–0.9)
TOTAL PROTEIN: 7.2 g/dL (ref 6.5–8.1)
Total Bilirubin: 0.8 mg/dL (ref 0.3–1.2)

## 2017-05-20 LAB — RAPID URINE DRUG SCREEN, HOSP PERFORMED
Amphetamines: NOT DETECTED
BENZODIAZEPINES: NOT DETECTED
Barbiturates: NOT DETECTED
Cocaine: NOT DETECTED
Opiates: NOT DETECTED
Tetrahydrocannabinol: NOT DETECTED

## 2017-05-20 LAB — MAGNESIUM: MAGNESIUM: 1.9 mg/dL (ref 1.7–2.4)

## 2017-05-20 LAB — CK: CK TOTAL: 81 U/L (ref 38–234)

## 2017-05-20 LAB — AMMONIA: Ammonia: 36 umol/L — ABNORMAL HIGH (ref 9–35)

## 2017-05-20 LAB — VITAMIN B12: VITAMIN B 12: 238 pg/mL (ref 180–914)

## 2017-05-20 LAB — TSH: TSH: 1.553 u[IU]/mL (ref 0.350–4.500)

## 2017-05-20 MED ORDER — SORBITOL 70 % SOLN
30.0000 mL | Freq: Every day | Status: DC | PRN
  Filled 2017-05-20: qty 30

## 2017-05-20 MED ORDER — SODIUM CHLORIDE 0.9% FLUSH
3.0000 mL | Freq: Two times a day (BID) | INTRAVENOUS | Status: DC
  Administered 2017-05-20 – 2017-05-22 (×4): 3 mL via INTRAVENOUS

## 2017-05-20 MED ORDER — ASPIRIN EC 81 MG PO TBEC
81.0000 mg | DELAYED_RELEASE_TABLET | Freq: Every day | ORAL | Status: DC
  Administered 2017-05-20 – 2017-05-22 (×3): 81 mg via ORAL
  Filled 2017-05-20 (×3): qty 1

## 2017-05-20 MED ORDER — LORAZEPAM 2 MG/ML IJ SOLN
0.5000 mg | Freq: Once | INTRAMUSCULAR | Status: AC
  Administered 2017-05-20: 0.5 mg via INTRAVENOUS
  Filled 2017-05-20: qty 1

## 2017-05-20 MED ORDER — ONDANSETRON HCL 4 MG PO TABS: 4.0000 mg | ORAL_TABLET | Freq: Four times a day (QID) | ORAL | Status: DC | PRN

## 2017-05-20 MED ORDER — ONDANSETRON HCL 4 MG/2ML IJ SOLN: 4.0000 mg | Freq: Four times a day (QID) | INTRAMUSCULAR | Status: DC | PRN

## 2017-05-20 MED ORDER — LOSARTAN POTASSIUM 50 MG PO TABS
100.0000 mg | ORAL_TABLET | Freq: Every day | ORAL | Status: DC
  Administered 2017-05-21 – 2017-05-22 (×2): 100 mg via ORAL
  Filled 2017-05-20 (×2): qty 2

## 2017-05-20 MED ORDER — SODIUM CHLORIDE 0.9 % IV SOLN
INTRAVENOUS | Status: DC
  Administered 2017-05-20 – 2017-05-22 (×4): via INTRAVENOUS

## 2017-05-20 MED ORDER — ACETAMINOPHEN 650 MG RE SUPP: 650.0000 mg | Freq: Four times a day (QID) | RECTAL | Status: DC | PRN

## 2017-05-20 MED ORDER — ENOXAPARIN SODIUM 40 MG/0.4ML ~~LOC~~ SOLN
40.0000 mg | SUBCUTANEOUS | Status: DC
  Administered 2017-05-20 – 2017-05-21 (×2): 40 mg via SUBCUTANEOUS
  Filled 2017-05-20 (×2): qty 0.4

## 2017-05-20 MED ORDER — ACETAMINOPHEN 325 MG PO TABS: 650.0000 mg | ORAL_TABLET | Freq: Four times a day (QID) | ORAL | Status: DC | PRN

## 2017-05-20 MED ORDER — NICOTINE 14 MG/24HR TD PT24
14.0000 mg | MEDICATED_PATCH | Freq: Every day | TRANSDERMAL | Status: DC
  Filled 2017-05-20 (×3): qty 1

## 2017-05-20 MED ORDER — ZONISAMIDE 100 MG PO CAPS
100.0000 mg | ORAL_CAPSULE | Freq: Every day | ORAL | Status: DC
  Administered 2017-05-21 – 2017-05-22 (×2): 100 mg via ORAL
  Filled 2017-05-20 (×2): qty 1

## 2017-05-20 MED ORDER — ATORVASTATIN CALCIUM 20 MG PO TABS
20.0000 mg | ORAL_TABLET | Freq: Every day | ORAL | Status: DC
  Administered 2017-05-20 – 2017-05-21 (×2): 20 mg via ORAL
  Filled 2017-05-20 (×3): qty 1

## 2017-05-20 MED ORDER — LORAZEPAM 2 MG/ML IJ SOLN: 0.5000 mg | Freq: Two times a day (BID) | INTRAMUSCULAR | Status: DC | PRN

## 2017-05-20 MED ORDER — TIOTROPIUM BROMIDE MONOHYDRATE 18 MCG IN CAPS
18.0000 ug | ORAL_CAPSULE | Freq: Every day | RESPIRATORY_TRACT | Status: DC
  Administered 2017-05-21 – 2017-05-22 (×2): 18 ug via RESPIRATORY_TRACT
  Filled 2017-05-20: qty 5

## 2017-05-20 MED ORDER — FLEET ENEMA 7-19 GM/118ML RE ENEM: 1.0000 | ENEMA | Freq: Once | RECTAL | Status: DC | PRN

## 2017-05-20 MED ORDER — LORAZEPAM 1 MG PO TABS: 0.5000 mg | ORAL_TABLET | Freq: Once | ORAL | Status: DC

## 2017-05-20 MED ORDER — LAMOTRIGINE 200 MG PO TABS
200.0000 mg | ORAL_TABLET | Freq: Every day | ORAL | Status: DC
  Administered 2017-05-20 – 2017-05-21 (×2): 200 mg via ORAL
  Filled 2017-05-20: qty 2
  Filled 2017-05-20 (×2): qty 1
  Filled 2017-05-20: qty 2

## 2017-05-20 MED ORDER — IBUPROFEN 400 MG PO TABS: 400.0000 mg | ORAL_TABLET | Freq: Four times a day (QID) | ORAL | Status: DC | PRN

## 2017-05-20 MED ORDER — METOPROLOL SUCCINATE ER 50 MG PO TB24
50.0000 mg | ORAL_TABLET | Freq: Every day | ORAL | Status: DC
  Administered 2017-05-21 – 2017-05-22 (×2): 50 mg via ORAL
  Filled 2017-05-20 (×2): qty 1

## 2017-05-20 MED ORDER — HYDRALAZINE HCL 20 MG/ML IJ SOLN: 5.0000 mg | Freq: Four times a day (QID) | INTRAMUSCULAR | Status: DC | PRN

## 2017-05-20 MED ORDER — LAMOTRIGINE 150 MG PO TABS
150.0000 mg | ORAL_TABLET | ORAL | Status: DC
  Administered 2017-05-21 – 2017-05-22 (×2): 150 mg via ORAL
  Filled 2017-05-20 (×2): qty 1

## 2017-05-20 MED ORDER — LEVALBUTEROL HCL 0.63 MG/3ML IN NEBU: 0.6300 mg | INHALATION_SOLUTION | RESPIRATORY_TRACT | Status: DC | PRN

## 2017-05-20 MED ORDER — SENNOSIDES-DOCUSATE SODIUM 8.6-50 MG PO TABS: 1.0000 | ORAL_TABLET | Freq: Every evening | ORAL | Status: DC | PRN

## 2017-05-20 MED ORDER — MOMETASONE FURO-FORMOTEROL FUM 100-5 MCG/ACT IN AERO
2.0000 | INHALATION_SPRAY | Freq: Two times a day (BID) | RESPIRATORY_TRACT | Status: DC
  Administered 2017-05-21 – 2017-05-22 (×3): 2 via RESPIRATORY_TRACT
  Filled 2017-05-20: qty 8.8

## 2017-05-20 MED ORDER — ZONISAMIDE 100 MG PO CAPS
200.0000 mg | ORAL_CAPSULE | Freq: Every day | ORAL | Status: DC
  Administered 2017-05-20 – 2017-05-21 (×2): 200 mg via ORAL
  Filled 2017-05-20 (×2): qty 2

## 2017-05-20 MED ORDER — SODIUM CHLORIDE 0.9 % IV BOLUS (SEPSIS): 1000.0000 mL | Freq: Once | INTRAVENOUS | Status: DC

## 2017-05-20 MED ORDER — LABETALOL HCL 200 MG PO TABS
100.0000 mg | ORAL_TABLET | Freq: Once | ORAL | Status: AC
  Administered 2017-05-20: 100 mg via ORAL
  Filled 2017-05-20: qty 1

## 2017-05-20 NOTE — ED Notes (Signed)
Patient transported to MRI 

## 2017-05-20 NOTE — ED Notes (Signed)
MRI called a third time by this nurse regarding transport for pt. Per MRI, order placed for transport to get pt, transport is on the way.

## 2017-05-20 NOTE — ED Notes (Signed)
Attempted report x1. 

## 2017-05-20 NOTE — ED Notes (Signed)
Admitting provider at the bedside.

## 2017-05-20 NOTE — ED Notes (Signed)
This RN spoke with MRI, updated about pt ativan order. Pt ready for scan. MRI aware.

## 2017-05-20 NOTE — H&P (Signed)
History and Physical    CHARMAGNE BUHL YTK:[id number] DOB: 07/04/1932 DOA: 05/20/2017  PCP: Mast, Man X, NP Patient coming from: Independent living at [facility]  I have personally briefly reviewed patient's old medical records in [place]  Chief Complaint: Confusion  HPI: Jessica [name] is a 83 y.o. female with medical history significant of COPD with ongoing tobacco use, anxiety, seizure disorder, history of colon cancer status post chemotherapy, history of melanoma status post treatment, gastroesophageal reflux disease, hyperlipidemia, hypertension, major depressive disorder, peripheral vascular disease who lives in independent living at [facility] home presented to the ED with altered mental status. Per ED report and per daughter staff at [facility] found patient on the floor in her laundry room. Patient unable to give a full history she states she does not know how she ended up on the floor and Y she ended up on the floor. Patient not sure whether this she had a syncopal episode or not. Patient not sure that she had a seizure or not. Patient was last seen normal the night prior to admission at dinner per daughter. Per daughter patient's baseline is alert oriented 4. Note no noted tongue biting on no noted urinary incontinence per ED report. Patient denies any fevers, no chills, no chest pain, no shortness of breath, no nausea, no vomiting, no abdominal pain, no dysuria, no weakness, no numbness, no melena, no hematemesis, no hematochezia, no cough, no closed patient, no diarrhea, no emesis. Per daughter patient has had decreased oral intake and decreased appetite over the past 6 months with a 20 pound weight loss as well as decreased energy. Patient also noted to have some depression. It is noted per daughter that patient's son died approximately a year ago around this time. Patient's daughter states patient does get fatigued with exertion. No other associated symptoms. It  was noted the patient CBG on EMS arrival was 131 at the facility. Patient also noted to be repetitive with her answers and alert to self and place. Patient thinks his 51. Patient pains Elyn Peers is the president. When asked what patient's last name is she keeps repeating her first name.  ED Course: Patient seen in the ED, comprehensive metabolic profile done was unremarkable. CBC done was unremarkable. Urinalysis was nitrite negative leukocytes -0-5 WBCs. Chest x-ray-cardiomegaly, no pulmonary venous congestion, chronic interstitial lung disease, no focal infiltrate. EKG done showed normal sinus rhythm with LVH. CT head and CT C-spine negative for any acute abnormalities. Tried hospitalists were called to admit the patient for further evaluation and management.  Review of Systems: As per HPI otherwise 10 point review of systems negative.   Past Medical History:  Diagnosis Date  . Alcohol abuse 11/11/2010  . Alcohol abuse, unspecified   . Anxiety   . Atrial fibrillation ([place]) 11/11/2010  . Bone infection of left hand ([place]) 12/22/2008   secondary to injury  . Cancer ([place])    colon cancer-hx of chemo  . Chronic airway obstruction, not elsewhere classified 10/01/2011  . Confusion   . COPD (chronic obstructive pulmonary disease) ([name])   . Dizziness and giddiness 04/24/2005  . Edema 12/03/2011  . Embolism - blood clot 1996   left breast  . Generalized convulsive epilepsy without mention of intractable epilepsy   . GERD (gastroesophageal reflux disease)   . Hemorrhoids   . Hyperlipidemia   . Hypertension   . Insomnia, unspecified 11/11/2010  . Major depressive disorder, single episode, unspecified 11/11/2010  . Malignant neoplasm of  stomach, unspecified site 11/11/2010  . Melanoma ([place])    metastatic  . Peripheral vascular disease ([place])   . Seizures ([place])   . Symptomatic menopausal or female climacteric states 11/11/2010  . Tachycardia, unspecified   . Tobacco use disorder   . Urgency of  urination     Past Surgical History:  Procedure Laterality Date  . ABDOMINAL HYSTERECTOMY  1963  . BLADDER REPAIR    . breast biopsies     x2 on left, x 1 on right  . c-sections  1957, 1959, 1961   x3  . COLON RESECTION  1995  . COLONOSCOPY  10/21/2012   Dr. Paulita Fujita diverticulosis, sessile polyps biopsy   . heart ablation  1997  . HEMORRHOID SURGERY    . laser surgery to right eye  10/04/2007  . left femoral artery stent  05/07/2004  . melanoma removed from left groin  09/2004  . melanoma removed from left leg  09/06/2004  . orif left humerus fracture  03/27/2005  . porta cath placement  1995  . porta cath removal  1996  . removal of tumor in left knee  11/15/2010  . skin graft to left leg  12/05/2004  . [place]     reports that she has been smoking Cigarettes.  She has a 30.00 pack-year smoking history. She has never used smokeless tobacco. She reports that she does not drink alcohol or use drugs.  Allergies  Allergen Reactions  . Aspirin Nausea And Vomiting  . Effexor [Venlafaxine Hcl]     Seizure  . Keflex [Cephalexin]   . Venlafaxine Other (See Comments)    seizures  . Penicillins Rash  . Zithromax [Azithromycin] Rash    History reviewed. No pertinent family history. Family history reviewed and not pertinent.  Prior to Admission medications   Medication Sig Start Date End Date Taking? Authorizing Provider  LAMICTAL 150 MG tablet Take 1 tablet (150 mg total) by mouth every morning. 12/24/16  Yes Marcial Pacas, MD  LAMICTAL 200 MG tablet Take 1 tablet (200 mg total) by mouth at bedtime. 12/24/16  Yes Marcial Pacas, MD  losartan (COZAAR) 100 MG tablet Take 1 tablet (100 mg total) by mouth daily. 05/11/17  Yes Mast, Man X, NP  metoprolol succinate (TOPROL-XL) 50 MG 24 hr tablet TAKE 1 TABLET BY MOUTH DAILY Patient taking differently: TAKE 50 mg TABLET BY MOUTH DAILY 02/11/17  Yes Mast, Man X, NP  zonisamide (ZONEGRAN) 100 MG capsule TAKE ONE CAPSULE BY MOUTH  EVERY MORNING AND TAKE TWO CAPSULES BY MOUTH EVERY EVENING FOR SEIZURE CONTROL Patient taking differently: TAKE 100 mg CAPSULE BY MOUTH EVERY MORNING AND TAKE 200 mg CAPSULES BY MOUTH EVERY EVENING FOR SEIZURE CONTROL 05/06/17  Yes Marcial Pacas, MD    Physical Exam: Vitals:   05/20/17 1600 05/20/17 1615 05/20/17 1700 05/20/17 1715  BP: (!) 190/88 (!) 171/82 (!) 161/83 (!) 178/85  Pulse: 84 82 80 90  Resp: (!) 26 (!) 25 (!) 21 (!) 24  Temp:      TempSrc:      SpO2: 94% 94% 94% 96%    Constitutional: NAD, calm, comfortable Vitals:   05/20/17 1600 05/20/17 1615 05/20/17 1700 05/20/17 1715  BP: (!) 190/88 (!) 171/82 (!) 161/83 (!) 178/85  Pulse: 84 82 80 90  Resp: (!) 26 (!) 25 (!) 21 (!) 24  Temp:      TempSrc:      SpO2: 94% 94% 94% 96%  Eyes: PERRLA, eomi, lids and conjunctivae normal ENMT: Mucous membranes are dry. Posterior pharynx clear of any exudate or lesions.Normal dentition.  Neck: normal, supple, no masses, no thyromegaly Respiratory: clear to auscultation bilaterally, no wheezing, no crackles. Normal respiratory effort. No accessory muscle use.  Cardiovascular: Regular rate and rhythm, no murmurs / rubs / gallops. No extremity edema. 2+ pedal pulses. No carotid bruits.  Abdomen: no tenderness, no masses palpated. No hepatosplenomegaly. Bowel sounds positive.  Musculoskeletal: no clubbing / cyanosis. No joint deformity upper and lower extremities. Good ROM, no contractures. Normal muscle tone.  Skin: no rashes, lesions, ulcers. No induration Neurologic: CN 2-12 grossly intact. Sensation intact, Strength 5/5 in all 4.  Psychiatric: Normal judgment and poor insight. Alert and oriented x 2. Normal mood.   Labs on Admission: I have personally reviewed following labs and imaging studies  CBC:  Recent Labs Lab 05/20/17 1252  WBC 8.8  NEUTROABS 7.0  HGB 13.7  HCT 42.1  MCV 89.0  PLT 993   Basic Metabolic Panel:  Recent Labs Lab 05/20/17 1252  NA 140  K 3.8  CL  109  CO2 22  GLUCOSE 105*  BUN 17  CREATININE 1.13*  CALCIUM 9.5   GFR: CrCl cannot be calculated (Unknown ideal weight.). Liver Function Tests:  Recent Labs Lab 05/20/17 1252  AST 18  ALT 12*  ALKPHOS 82  BILITOT 0.6  PROT 6.8  ALBUMIN 3.9   No results for input(s): LIPASE, AMYLASE in the last 168 hours. No results for input(s): AMMONIA in the last 168 hours. Coagulation Profile: No results for input(s): INR, PROTIME in the last 168 hours. Cardiac Enzymes: No results for input(s): CKTOTAL, CKMB, CKMBINDEX, TROPONINI in the last 168 hours. BNP (last 3 results) No results for input(s): PROBNP in the last 8760 hours. HbA1C: No results for input(s): HGBA1C in the last 72 hours. CBG: No results for input(s): GLUCAP in the last 168 hours. Lipid Profile: No results for input(s): CHOL, HDL, LDLCALC, TRIG, CHOLHDL, LDLDIRECT in the last 72 hours. Thyroid Function Tests: No results for input(s): TSH, T4TOTAL, FREET4, T3FREE, THYROIDAB in the last 72 hours. Anemia Panel: No results for input(s): VITAMINB12, FOLATE, FERRITIN, TIBC, IRON, RETICCTPCT in the last 72 hours. Urine analysis:    Component Value Date/Time   COLORURINE YELLOW 05/20/2017 1449   APPEARANCEUR HAZY (A) 05/20/2017 1449   LABSPEC 1.015 05/20/2017 1449   PHURINE 6.0 05/20/2017 1449   GLUCOSEU NEGATIVE 05/20/2017 1449   HGBUR NEGATIVE 05/20/2017 1449   BILIRUBINUR NEGATIVE 05/20/2017 1449   KETONESUR 20 (A) 05/20/2017 1449   PROTEINUR 30 (A) 05/20/2017 1449   UROBILINOGEN 1.0 11/25/2011 2313   NITRITE NEGATIVE 05/20/2017 1449   LEUKOCYTESUR NEGATIVE 05/20/2017 1449    Radiological Exams on Admission: Ct Head Wo Contrast  Result Date: 05/20/2017 CLINICAL DATA:  Last seen normal yesterday afternoon, seizure patient, possible fall EXAM: CT HEAD WITHOUT CONTRAST CT CERVICAL SPINE WITHOUT CONTRAST TECHNIQUE: Multidetector CT imaging of the head and cervical spine was performed following the standard  protocol without intravenous contrast. Multiplanar CT image reconstructions of the cervical spine were also generated. COMPARISON:  MRI brain dated 10/10/2011.  CT head dated 10/03/2011. FINDINGS: CT HEAD FINDINGS Brain: No evidence of acute infarction, hemorrhage, hydrocephalus, extra-axial collection or mass lesion/mass effect. Subcortical white matter and periventricular small vessel ischemic changes. Vascular: Intracranial atherosclerosis. Skull: Normal. Negative for fracture or focal lesion. Sinuses/Orbits: Minimal partial opacification of the right maxillary sinus. Mastoid air cells are clear. Other: None. CT  CERVICAL SPINE FINDINGS Alignment: Normal cervical lordosis. Skull base and vertebrae: No acute fracture. No primary bone lesion or focal pathologic process. Soft tissues and spinal canal: No prevertebral fluid or swelling. No visible canal hematoma. Disc levels: Mild degenerative changes of the mid/lower cervical spine. Spinal canal is patent. Upper chest: Moderate centrilobular emphysematous changes with biapical pleural-parenchymal scarring. Other: Visualized thyroid is unremarkable IMPRESSION: No evidence of acute intracranial abnormality. Mild small vessel ischemic changes. No evidence of traumatic injury to the cervical spine. Electronically Signed   By: Julian Hy M.D.   On: 05/20/2017 14:07   Ct Cervical Spine Wo Contrast  Result Date: 05/20/2017 CLINICAL DATA:  Last seen normal yesterday afternoon, seizure patient, possible fall EXAM: CT HEAD WITHOUT CONTRAST CT CERVICAL SPINE WITHOUT CONTRAST TECHNIQUE: Multidetector CT imaging of the head and cervical spine was performed following the standard protocol without intravenous contrast. Multiplanar CT image reconstructions of the cervical spine were also generated. COMPARISON:  MRI brain dated 10/10/2011.  CT head dated 10/03/2011. FINDINGS: CT HEAD FINDINGS Brain: No evidence of acute infarction, hemorrhage, hydrocephalus, extra-axial  collection or mass lesion/mass effect. Subcortical white matter and periventricular small vessel ischemic changes. Vascular: Intracranial atherosclerosis. Skull: Normal. Negative for fracture or focal lesion. Sinuses/Orbits: Minimal partial opacification of the right maxillary sinus. Mastoid air cells are clear. Other: None. CT CERVICAL SPINE FINDINGS Alignment: Normal cervical lordosis. Skull base and vertebrae: No acute fracture. No primary bone lesion or focal pathologic process. Soft tissues and spinal canal: No prevertebral fluid or swelling. No visible canal hematoma. Disc levels: Mild degenerative changes of the mid/lower cervical spine. Spinal canal is patent. Upper chest: Moderate centrilobular emphysematous changes with biapical pleural-parenchymal scarring. Other: Visualized thyroid is unremarkable IMPRESSION: No evidence of acute intracranial abnormality. Mild small vessel ischemic changes. No evidence of traumatic injury to the cervical spine. Electronically Signed   By: Julian Hy M.D.   On: 05/20/2017 14:07   Dg Chest Port 1 View  Addendum Date: 05/20/2017   ADDENDUM REPORT: 05/20/2017 14:17 ADDENDUM: The last sentence in the impression should read:No focal infiltrate. Electronically Signed   By: Marcello Moores  Register   On: 05/20/2017 14:17   Result Date: 05/20/2017 CLINICAL DATA:  07/24/2015. EXAM: PORTABLE CHEST 1 VIEW COMPARISON:  07/24/2015. FINDINGS: Mediastinum hilar structures are normal. Cardiomegaly with normal pulmonary vascularity. No focal infiltrate. Punctate calcific density over the right mid lung consistent granuloma again noted. Chronic interstitial changes noted. Biapical pleuroparenchymal thickening consistent scarring No pleural effusion or pneumothorax. Skin fold noted on the left. Surgical screws left humerus and old right proximal humeral fracture again noted. IMPRESSION: 1. Cardiomegaly.  No pulmonary venous congestion. 2. Chronic interstitial lung disease. Biapical  pleural-parenchymal thickening consistent scarring. A focal infiltrate. Electronically Signed: ByMarcello Moores  Register On: 05/20/2017 13:34    EKG: Independently reviewed. Normal sinus rhythm with LVH.  Assessment/Plan Principal Problem:   Encephalopathy acute Active Problems:   COPD (chronic obstructive pulmonary disease) (HCC)   Hypertension   Hyperlipemia   Tobacco abuse   PVD (peripheral vascular disease) (HCC)   GERD (gastroesophageal reflux disease)   Seizures ([facility])   Peripheral vascular disease ([facility])   Anxiety   Hyperlipidemia   Alcohol abuse   #1 acute encephalopathy Questionable etiology. Patient is afebrile. CBC is unremarkable. Urinalysis and chest x-ray are unremarkable. Patient afebrile and as such without an infectious etiology. Head CT negative for any acute infarct. Patient does have a prior history of colon cancer as well as melanoma. Patient does not  have any focal neurological deficits. Will get a MRI of the head. Check a EEG. Check a RPR, B-12, RBC folate, TSH, HIV, ammonia levels. Place on IV fluids. PT/OT/ST. Follow.  #2 COPD Stable. Patient with ongoing tobacco use. Place on [place]. Nebs as needed.  #3 gastroesophageal reflux disease PPI.  #4 history of seizure disorder Patient with no noted seizures. Patient with no urinary or bowel incontinence. Patient with no tongue biting noted on examination. Patient however is acutely confused. Check an EEG to rule out seizures. Check MRI head. Continue home regimen of Lamictal and zonegran. Follow.  #5 anxiety Ativan as needed.  #6 hyperlipidemia Check a fasting lipid panel. Follow.  #7 hypertension Resume home regimen of losartan and Toprol and adjust as needed. Hydralazine when necessary.  #8 tobacco abuse  tobacco cessation. Placed on a nicotine patch.  #9 history of alcohol use Per daughter patient with no recent R, will use and has been without alcohol for a few years.  #10 peripheral  vascular disease Stable. Place on aspirin 81 mg daily. Check a fasting lipid panel. May need to be placed on a statin.  DVT prophylaxis: Lovenox Code Status: DO NOT RESUSCITATE Family Communication: Updated patient and daughter who is healthcare power of attorney at bedside. Disposition Plan: Likely back to [facility] home when medically stable and pending PT OT evaluation. Consults called: None Admission status: Place in observation.   [facility] MD [facility] Pager [phone]  If 7PM-7AM, please contact night-coverage [email or web address] Password [facility]  05/20/2017, 6:26 PM

## 2017-05-20 NOTE — ED Notes (Signed)
Pt to be transported to MRI before going upstairs. Admitting paged for order for medication for MRI.

## 2017-05-20 NOTE — ED Notes (Signed)
5W aware pt is on the way upstairs.

## 2017-05-20 NOTE — ED Triage Notes (Signed)
Patient  Presents from [facility]. Per EMS patient was found in the Laundry room with abrasion to right elbow. EMS reports staff is unaware if patient had seizure or fell or had syncope. Per EMS patient alert and oriented x4 at baseline. On arrival patient only alert and oriented to person. Patient is able to follow commands. Per EMS patient was last seen yesterday afternoon.

## 2017-05-20 NOTE — ED Provider Notes (Signed)
[facility] DEPT Provider Note   CSN: [id number] Arrival date & time: 05/20/17  1238     History   Chief Complaint Chief Complaint  Patient presents with  . Fall    HPI Jessica [name] is a 83 y.o. female who presents by EMS for being found on the floor in her laundry room. She lives in independent living and was last seen normal last night.  According to EMS they were told patient at baseline is a and O 4. Patient currently denies any pain, headache, she is unable to provide further history of present illness secondary to confusion  HPI  Past Medical History:  Diagnosis Date  . Alcohol abuse 11/11/2010  . Alcohol abuse, unspecified   . Anxiety   . Atrial fibrillation ([place]) 11/11/2010  . Bone infection of left hand ([place]) 12/22/2008   secondary to injury  . Cancer ([place])    colon cancer-hx of chemo  . Chronic airway obstruction, not elsewhere classified 10/01/2011  . Confusion   . COPD (chronic obstructive pulmonary disease) ([place])   . Dizziness and giddiness 04/24/2005  . Edema 12/03/2011  . Embolism - blood clot 1996   left breast  . Generalized convulsive epilepsy without mention of intractable epilepsy   . GERD (gastroesophageal reflux disease)   . Hemorrhoids   . Hyperlipidemia   . Hypertension   . Insomnia, unspecified 11/11/2010  . Major depressive disorder, single episode, unspecified 11/11/2010  . Malignant neoplasm of stomach, unspecified site 11/11/2010  . Melanoma ([place])    metastatic  . Peripheral vascular disease ([place])   . Seizures ([place])   . Symptomatic menopausal or female climacteric states 11/11/2010  . Tachycardia, unspecified   . Tobacco use disorder   . Urgency of urination     Patient Active Problem List   Diagnosis Date Noted  . Encephalopathy acute 05/20/2017  . Loss of weight 01/18/2015  . Osteoarthritis of finger 02/16/2014  . Seizures ([place])   . Peripheral vascular disease ([place])   . Anxiety   . Hyperlipidemia   . Cancer ([place])   .  Confusion   . Alcohol abuse   . Tachycardia   . Generalized convulsive epilepsy ([place])   . GERD (gastroesophageal reflux disease) 02/03/2013  . Acute exacerbation of chronic obstructive pulmonary disease (COPD) ([place]) 10/07/2011  . Encephalopathy 10/03/2011  . PVD (peripheral vascular disease) ([place]) 10/03/2011  . Personal history of colon cancer 10/03/2011  . Bronchitis 09/26/2011  . COPD (chronic obstructive pulmonary disease) ([place]) 09/26/2011  . Hypertension 09/26/2011  . Hyperlipemia 09/26/2011  . Tobacco abuse 09/26/2011  . Diarrhea 09/26/2011  . Bone infection of left hand ([place]) 12/22/2008    Past Surgical History:  Procedure Laterality Date  . ABDOMINAL HYSTERECTOMY  1963  . BLADDER REPAIR    . breast biopsies     x2 on left, x 1 on right  . c-sections  1957, 1959, 1961   x3  . COLON RESECTION  1995  . COLONOSCOPY  10/21/2012   Dr. Paulita Fujita diverticulosis, sessile polyps biopsy   . heart ablation  1997  . HEMORRHOID SURGERY    . laser surgery to right eye  10/04/2007  . left femoral artery stent  05/07/2004  . melanoma removed from left groin  09/2004  . melanoma removed from left leg  09/06/2004  . orif left humerus fracture  03/27/2005  . porta cath placement  1995  . porta cath removal  1996  . removal of tumor in left knee  11/15/2010  . skin graft to left leg  12/05/2004  . [place]    OB History    No data available       Home Medications    Prior to Admission medications   Medication Sig Start Date End Date Taking? Authorizing Provider  LAMICTAL 150 MG tablet Take 1 tablet (150 mg total) by mouth every morning. 12/24/16  Yes Marcial Pacas, MD  LAMICTAL 200 MG tablet Take 1 tablet (200 mg total) by mouth at bedtime. 12/24/16  Yes Marcial Pacas, MD  losartan (COZAAR) 100 MG tablet Take 1 tablet (100 mg total) by mouth daily. 05/11/17  Yes Mast, Man X, NP  metoprolol succinate (TOPROL-XL) 50 MG 24 hr tablet TAKE 1 TABLET BY MOUTH DAILY Patient taking  differently: TAKE 50 mg TABLET BY MOUTH DAILY 02/11/17  Yes Mast, Man X, NP  zonisamide (ZONEGRAN) 100 MG capsule TAKE ONE CAPSULE BY MOUTH EVERY MORNING AND TAKE TWO CAPSULES BY MOUTH EVERY EVENING FOR SEIZURE CONTROL Patient taking differently: TAKE 100 mg CAPSULE BY MOUTH EVERY MORNING AND TAKE 200 mg CAPSULES BY MOUTH EVERY EVENING FOR SEIZURE CONTROL 05/06/17  Yes Marcial Pacas, MD    Family History History reviewed. No pertinent family history.  Social History Social History  Substance Use Topics  . Smoking status: Current Every Day Smoker    Packs/day: 0.50    Years: 60.00    Types: Cigarettes  . Smokeless tobacco: Never Used  . Alcohol use No     Comment: quit 02-02-2010     Allergies   Aspirin; Effexor [venlafaxine hcl]; Keflex [cephalexin]; Venlafaxine; Penicillins; and Zithromax [azithromycin]   Review of Systems Review of Systems  Unable to perform ROS: Mental status change     Physical Exam Updated Vital Signs BP (!) 157/92   Pulse 90   Temp 98.5 F (36.9 C) (Oral)   Resp (!) 25   SpO2 96%   Physical Exam  Constitutional: She appears well-developed and well-nourished. No distress.  HENT:  Head: Normocephalic and atraumatic.  Right Ear: External ear normal.  Left Ear: External ear normal.  Mouth/Throat: Oropharynx is clear and moist.  Eyes: Conjunctivae are normal. Right eye exhibits no discharge. Left eye exhibits no discharge. No scleral icterus.  Neck: Normal range of motion. Neck supple.  Cardiovascular: Normal rate, regular rhythm, normal heart sounds, intact distal pulses and normal pulses.   No murmur heard. Pulmonary/Chest: Effort normal. No accessory muscle usage or stridor. No respiratory distress. She has rhonchi in the right upper field, the right middle field, the right lower field, the left upper field, the left middle field and the left lower field.  Abdominal: Soft. Bowel sounds are normal. She exhibits no distension. There is no tenderness.  There is no guarding.  Musculoskeletal: She exhibits no edema or deformity.  All extremities palpated without obvious edema, deformity, patient does not react in pain with extremity palpation. All compartments are soft.  Neurological: She is alert. She exhibits normal muscle tone.  Patient is alert and oriented to person and place, not to time or previous events.  She is intermittently able to follow one-step commands however needs repeat prompting.  5/5 grip strength bilaterally and bilateral ankles/dorsiflexion of ankles.  Patient does not have facial droop, symmetrical smile, unable to adequately follow instructions to check pronator drift.  Skin: Skin is warm and dry. She is not diaphoretic.  Psychiatric: She exhibits abnormal recent memory. She is inattentive.  Nursing note and  vitals reviewed.    ED Treatments / Results  Labs (all labs ordered are listed, but only abnormal results are displayed) Labs Reviewed  URINALYSIS, ROUTINE W REFLEX MICROSCOPIC - Abnormal; Notable for the following:       Result Value   APPearance HAZY (*)    Ketones, ur 20 (*)    Protein, ur 30 (*)    Bacteria, UA MANY (*)    Squamous Epithelial / LPF 0-5 (*)    All other components within normal limits  COMPREHENSIVE METABOLIC PANEL - Abnormal; Notable for the following:    Glucose, Bld 105 (*)    Creatinine, Ser 1.13 (*)    ALT 12 (*)    GFR calc non Af Amer 44 (*)    GFR calc Af Amer 51 (*)    All other components within normal limits  AMMONIA - Abnormal; Notable for the following:    Ammonia 36 (*)    All other components within normal limits  CBC WITH DIFFERENTIAL/PLATELET  ETHANOL  RAPID URINE DRUG SCREEN, HOSP PERFORMED  CK  LIPID PANEL  CBG MONITORING, ED    EKG  EKG Interpretation  Date/Time:  Wednesday May 20 2017 12:50:11 EDT Ventricular Rate:  83 PR Interval:    QRS Duration: 88 QT Interval:  405 QTC Calculation: 476 R Axis:   54 Text Interpretation:  Sinus rhythm Left  ventricular hypertrophy Confirmed by Veryl Speak [phone]) on 05/20/2017 4:57:59 PM       Radiology Ct Head Wo Contrast  Result Date: 05/20/2017 CLINICAL DATA:  Last seen normal yesterday afternoon, seizure patient, possible fall EXAM: CT HEAD WITHOUT CONTRAST CT CERVICAL SPINE WITHOUT CONTRAST TECHNIQUE: Multidetector CT imaging of the head and cervical spine was performed following the standard protocol without intravenous contrast. Multiplanar CT image reconstructions of the cervical spine were also generated. COMPARISON:  MRI brain dated 10/10/2011.  CT head dated 10/03/2011. FINDINGS: CT HEAD FINDINGS Brain: No evidence of acute infarction, hemorrhage, hydrocephalus, extra-axial collection or mass lesion/mass effect. Subcortical white matter and periventricular small vessel ischemic changes. Vascular: Intracranial atherosclerosis. Skull: Normal. Negative for fracture or focal lesion. Sinuses/Orbits: Minimal partial opacification of the right maxillary sinus. Mastoid air cells are clear. Other: None. CT CERVICAL SPINE FINDINGS Alignment: Normal cervical lordosis. Skull base and vertebrae: No acute fracture. No primary bone lesion or focal pathologic process. Soft tissues and spinal canal: No prevertebral fluid or swelling. No visible canal hematoma. Disc levels: Mild degenerative changes of the mid/lower cervical spine. Spinal canal is patent. Upper chest: Moderate centrilobular emphysematous changes with biapical pleural-parenchymal scarring. Other: Visualized thyroid is unremarkable IMPRESSION: No evidence of acute intracranial abnormality. Mild small vessel ischemic changes. No evidence of traumatic injury to the cervical spine. Electronically Signed   By: Julian Hy M.D.   On: 05/20/2017 14:07   Ct Cervical Spine Wo Contrast  Result Date: 05/20/2017 CLINICAL DATA:  Last seen normal yesterday afternoon, seizure patient, possible fall EXAM: CT HEAD WITHOUT CONTRAST CT CERVICAL SPINE WITHOUT  CONTRAST TECHNIQUE: Multidetector CT imaging of the head and cervical spine was performed following the standard protocol without intravenous contrast. Multiplanar CT image reconstructions of the cervical spine were also generated. COMPARISON:  MRI brain dated 10/10/2011.  CT head dated 10/03/2011. FINDINGS: CT HEAD FINDINGS Brain: No evidence of acute infarction, hemorrhage, hydrocephalus, extra-axial collection or mass lesion/mass effect. Subcortical white matter and periventricular small vessel ischemic changes. Vascular: Intracranial atherosclerosis. Skull: Normal. Negative for fracture or focal lesion. Sinuses/Orbits: Minimal partial opacification of  the right maxillary sinus. Mastoid air cells are clear. Other: None. CT CERVICAL SPINE FINDINGS Alignment: Normal cervical lordosis. Skull base and vertebrae: No acute fracture. No primary bone lesion or focal pathologic process. Soft tissues and spinal canal: No prevertebral fluid or swelling. No visible canal hematoma. Disc levels: Mild degenerative changes of the mid/lower cervical spine. Spinal canal is patent. Upper chest: Moderate centrilobular emphysematous changes with biapical pleural-parenchymal scarring. Other: Visualized thyroid is unremarkable IMPRESSION: No evidence of acute intracranial abnormality. Mild small vessel ischemic changes. No evidence of traumatic injury to the cervical spine. Electronically Signed   By: Julian Hy M.D.   On: 05/20/2017 14:07   Dg Chest Port 1 View  Addendum Date: 05/20/2017   ADDENDUM REPORT: 05/20/2017 14:17 ADDENDUM: The last sentence in the impression should read:No focal infiltrate. Electronically Signed   By: Marcello Moores  Register   On: 05/20/2017 14:17   Result Date: 05/20/2017 CLINICAL DATA:  07/24/2015. EXAM: PORTABLE CHEST 1 VIEW COMPARISON:  07/24/2015. FINDINGS: Mediastinum hilar structures are normal. Cardiomegaly with normal pulmonary vascularity. No focal infiltrate. Punctate calcific density over  the right mid lung consistent granuloma again noted. Chronic interstitial changes noted. Biapical pleuroparenchymal thickening consistent scarring No pleural effusion or pneumothorax. Skin fold noted on the left. Surgical screws left humerus and old right proximal humeral fracture again noted. IMPRESSION: 1. Cardiomegaly.  No pulmonary venous congestion. 2. Chronic interstitial lung disease. Biapical pleural-parenchymal thickening consistent scarring. A focal infiltrate. Electronically Signed: By: Marcello Moores  Register On: 05/20/2017 13:34    Procedures Procedures (including critical care time)  Medications Ordered in ED Medications              0.9 %  sodium chloride infusion (not administered)            labetalol (NORMODYNE) tablet 100 mg (100 mg Oral Given 05/20/17 1723)       Initial Impression / Assessment and Plan / ED Course  I have reviewed the triage vital signs and the nursing notes.  Pertinent labs & imaging results that were available during my care of the patient were reviewed by me and considered in my medical decision making (see chart for details).  Clinical Course as of May 20 2025  Wed May 20, 2017  1645 Spoke with hospitalist, will admit for AMS.   [EH]    Clinical Course User Index [EH] Lorin Glass, PA-C   [place] after being found on the floor of her independent living home this morning by a neighbor. It is unknown when she fell or what caused her to fall if it was seizure, mechanical, or other organic cause.  Patient is normally fully alert and oriented however on my exam is only alert and oriented 2. No focal neuro deficits.  CT head and neck were obtained and reviewed.   Neuro exam was limited due to patient's inability to remember and follow directions over she had good grip strength bilaterally and good strength with ankle dorsi/plantar flexion.  Labs were obtained with out cause for her symptoms found.  Urine was obtained with  out clear evidence for infection.  Unsure what is causing her confusion or her to end up on the floor.  Will plan for admission for continued work up and possibly to obtain a MRI.     This was a shared visit with Dr. Stark Jock who evaluated the patient and agreed with my plan.  The patient appears reasonably stabilized for admission considering the current resources, flow,  and capabilities available in the ED at this time, and I doubt any other [facility] requiring further screening and/or treatment in the ED prior to admission.    Final Clinical Impressions(s) / ED Diagnoses   Final diagnoses:  Confusion  Fall, initial encounter    New Prescriptions New Prescriptions   No medications on file     Ollen Gross 05/20/17 2027    Veryl Speak, MD 05/21/17 [phone]

## 2017-05-21 ENCOUNTER — Observation Stay (HOSPITAL_COMMUNITY): Payer: Medicare Other

## 2017-05-21 DIAGNOSIS — J449 Chronic obstructive pulmonary disease, unspecified: Secondary | ICD-10-CM | POA: Diagnosis present

## 2017-05-21 DIAGNOSIS — I361 Nonrheumatic tricuspid (valve) insufficiency: Secondary | ICD-10-CM | POA: Diagnosis not present

## 2017-05-21 DIAGNOSIS — Z66 Do not resuscitate: Secondary | ICD-10-CM | POA: Diagnosis present

## 2017-05-21 DIAGNOSIS — Z85028 Personal history of other malignant neoplasm of stomach: Secondary | ICD-10-CM | POA: Diagnosis not present

## 2017-05-21 DIAGNOSIS — Z90710 Acquired absence of both cervix and uterus: Secondary | ICD-10-CM | POA: Diagnosis not present

## 2017-05-21 DIAGNOSIS — F419 Anxiety disorder, unspecified: Secondary | ICD-10-CM | POA: Diagnosis present

## 2017-05-21 DIAGNOSIS — F1010 Alcohol abuse, uncomplicated: Secondary | ICD-10-CM | POA: Diagnosis present

## 2017-05-21 DIAGNOSIS — Z85820 Personal history of malignant melanoma of skin: Secondary | ICD-10-CM | POA: Diagnosis not present

## 2017-05-21 DIAGNOSIS — R410 Disorientation, unspecified: Secondary | ICD-10-CM | POA: Diagnosis present

## 2017-05-21 DIAGNOSIS — Z9221 Personal history of antineoplastic chemotherapy: Secondary | ICD-10-CM | POA: Diagnosis not present

## 2017-05-21 DIAGNOSIS — G40409 Other generalized epilepsy and epileptic syndromes, not intractable, without status epilepticus: Secondary | ICD-10-CM | POA: Diagnosis present

## 2017-05-21 DIAGNOSIS — I119 Hypertensive heart disease without heart failure: Secondary | ICD-10-CM | POA: Diagnosis present

## 2017-05-21 DIAGNOSIS — R9401 Abnormal electroencephalogram [EEG]: Secondary | ICD-10-CM | POA: Diagnosis present

## 2017-05-21 DIAGNOSIS — E43 Unspecified severe protein-calorie malnutrition: Secondary | ICD-10-CM | POA: Diagnosis present

## 2017-05-21 DIAGNOSIS — Z85038 Personal history of other malignant neoplasm of large intestine: Secondary | ICD-10-CM | POA: Diagnosis not present

## 2017-05-21 DIAGNOSIS — G9340 Encephalopathy, unspecified: Secondary | ICD-10-CM | POA: Diagnosis not present

## 2017-05-21 DIAGNOSIS — F17210 Nicotine dependence, cigarettes, uncomplicated: Secondary | ICD-10-CM | POA: Diagnosis present

## 2017-05-21 DIAGNOSIS — I739 Peripheral vascular disease, unspecified: Secondary | ICD-10-CM | POA: Diagnosis present

## 2017-05-21 DIAGNOSIS — Z9049 Acquired absence of other specified parts of digestive tract: Secondary | ICD-10-CM | POA: Diagnosis not present

## 2017-05-21 DIAGNOSIS — K219 Gastro-esophageal reflux disease without esophagitis: Secondary | ICD-10-CM | POA: Diagnosis present

## 2017-05-21 DIAGNOSIS — G9349 Other encephalopathy: Secondary | ICD-10-CM | POA: Diagnosis present

## 2017-05-21 DIAGNOSIS — E876 Hypokalemia: Secondary | ICD-10-CM | POA: Diagnosis present

## 2017-05-21 DIAGNOSIS — E785 Hyperlipidemia, unspecified: Secondary | ICD-10-CM | POA: Diagnosis present

## 2017-05-21 DIAGNOSIS — F329 Major depressive disorder, single episode, unspecified: Secondary | ICD-10-CM | POA: Diagnosis present

## 2017-05-21 DIAGNOSIS — Z681 Body mass index (BMI) 19 or less, adult: Secondary | ICD-10-CM | POA: Diagnosis not present

## 2017-05-21 DIAGNOSIS — I4891 Unspecified atrial fibrillation: Secondary | ICD-10-CM | POA: Diagnosis present

## 2017-05-21 DIAGNOSIS — R627 Adult failure to thrive: Secondary | ICD-10-CM | POA: Diagnosis present

## 2017-05-21 DIAGNOSIS — R0602 Shortness of breath: Secondary | ICD-10-CM | POA: Diagnosis not present

## 2017-05-21 LAB — URINALYSIS, ROUTINE W REFLEX MICROSCOPIC
Bilirubin Urine: NEGATIVE
GLUCOSE, UA: NEGATIVE mg/dL
Hgb urine dipstick: NEGATIVE
Ketones, ur: NEGATIVE mg/dL
LEUKOCYTES UA: NEGATIVE
NITRITE: NEGATIVE
PH: 7 (ref 5.0–8.0)
Protein, ur: NEGATIVE mg/dL
Specific Gravity, Urine: 1.014 (ref 1.005–1.030)

## 2017-05-21 LAB — COMPREHENSIVE METABOLIC PANEL
ALBUMIN: 3.2 g/dL — AB (ref 3.5–5.0)
ALT: 8 U/L — ABNORMAL LOW (ref 14–54)
AST: 14 U/L — AB (ref 15–41)
Alkaline Phosphatase: 68 U/L (ref 38–126)
Anion gap: 8 (ref 5–15)
BUN: 16 mg/dL (ref 6–20)
CHLORIDE: 108 mmol/L (ref 101–111)
CO2: 22 mmol/L (ref 22–32)
Calcium: 8.5 mg/dL — ABNORMAL LOW (ref 8.9–10.3)
Creatinine, Ser: 0.88 mg/dL (ref 0.44–1.00)
GFR calc Af Amer: >60 mL/min (ref >60)
GFR, EST NON AFRICAN AMERICAN: 60 mL/min — AB (ref >60)
Glucose, Bld: 80 mg/dL (ref 65–99)
POTASSIUM: 3 mmol/L — AB (ref 3.5–5.1)
Sodium: 138 mmol/L (ref 135–145)
Total Bilirubin: 1 mg/dL (ref 0.3–1.2)
Total Protein: 5.6 g/dL — ABNORMAL LOW (ref 6.5–8.1)

## 2017-05-21 LAB — CBC
HEMATOCRIT: 37.7 % (ref 36.0–46.0)
Hemoglobin: 12.1 g/dL (ref 12.0–15.0)
MCH: 28.5 pg (ref 26.0–34.0)
MCHC: 32.1 g/dL (ref 30.0–36.0)
MCV: 88.9 fL (ref 78.0–100.0)
Platelets: 221 10*3/uL (ref 150–400)
RBC: 4.24 MIL/uL (ref 3.87–5.11)
RDW: 14.5 % (ref 11.5–15.5)
WBC: 6.6 10*3/uL (ref 4.0–10.5)

## 2017-05-21 LAB — RPR: RPR Ser Ql: NONREACTIVE

## 2017-05-21 LAB — LIPID PANEL
Cholesterol: 148 mg/dL (ref 0–200)
HDL: 53 mg/dL (ref >40)
LDL CALC: 82 mg/dL (ref 0–99)
TRIGLYCERIDES: 66 mg/dL (ref <150)
Total CHOL/HDL Ratio: 2.8 RATIO
VLDL: 13 mg/dL (ref 0–40)

## 2017-05-21 LAB — MAGNESIUM: MAGNESIUM: 1.9 mg/dL (ref 1.7–2.4)

## 2017-05-21 LAB — GLUCOSE, CAPILLARY: GLUCOSE-CAPILLARY: 129 mg/dL — AB (ref 65–99)

## 2017-05-21 LAB — HIV ANTIBODY (ROUTINE TESTING W REFLEX): HIV Screen 4th Generation wRfx: NONREACTIVE

## 2017-05-21 MED ORDER — BOOST / RESOURCE BREEZE PO LIQD
1.0000 | Freq: Three times a day (TID) | ORAL | Status: DC
  Administered 2017-05-21: 1 via ORAL

## 2017-05-21 MED ORDER — POTASSIUM CHLORIDE CRYS ER 20 MEQ PO TBCR
40.0000 meq | EXTENDED_RELEASE_TABLET | ORAL | Status: AC
  Administered 2017-05-21 (×2): 40 meq via ORAL
  Filled 2017-05-21 (×2): qty 2

## 2017-05-21 NOTE — Procedures (Signed)
ELECTROENCEPHALOGRAM REPORT  Date of Study: 05/21/2017  Patient's Name: Jessica [name] MRN: [id number] Date of Birth: 01/17/1932  Referring Provider: Dr. Irine Seal  Clinical History: This is an 83 year old woman with altered mental status.  Medications: zonisamide (ZONEGRAN) capsule 100 mg  zonisamide (ZONEGRAN) capsule 200 mg  lamoTRIgine (LAMICTAL) tablet 150 mg  lamoTRIgine (LAMICTAL) tablet 200 mg  acetaminophen (TYLENOL) tablet 650 mg  aspirin EC tablet 81 mg  atorvastatin (LIPITOR) tablet 20 mg  enoxaparin (LOVENOX) injection 40 mg  feeding supplement (BOOST / RESOURCE BREEZE) liquid 1 Container  hydrALAZINE (APRESOLINE) injection 5 mg  ibuprofen (ADVIL,MOTRIN) tablet 400 mg  levalbuterol (XOPENEX) nebulizer solution 0.63 mg  LORazepam (ATIVAN) injection 0.5 mg  losartan (COZAAR) tablet 100 mg  metoprolol succinate (TOPROL-XL) 24 hr tablet 50 mg  mometasone-formoterol (DULERA) 100-5 MCG/ACT inhaler 2 puff  nicotine (NICODERM CQ - dosed in mg/24 hours) patch 14 mg  potassium chloride SA (K-DUR,KLOR-CON) CR tablet 40 mEq  senna-docusate (Senokot-S) tablet 1 tablet  tiotropium (SPIRIVA) inhalation capsule 18 mcg   Technical Summary: A multichannel digital EEG recording measured by the international 10-20 system with electrodes applied with paste and impedances below 5000 ohms performed in our laboratory with EKG monitoring in an awake and asleep patient.  Hyperventilation and photic stimulation were not performed.  The digital EEG was referentially recorded, reformatted, and digitally filtered in a variety of bipolar and referential montages for optimal display.    Description: The patient is awake and asleep during the recording.  During maximal wakefulness, there is a symmetric, medium voltage 10 Hz posterior dominant rhythm that attenuates with eye opening.  The record is symmetric.  During drowsiness and sleep, there is an increase in theta slowing of the  background.  Vertex waves and symmetric sleep spindles were seen.  Hyperventilation and photic stimulation were not performed. There were frequent 0.5-1.5 second bursts of generalized high voltage 4-5 Hz spike and polyspike and wave discharges with frontal predominance that increase in frequency in sleep. There were no electrographic seizures seen.    EKG lead was unremarkable.  Impression: This awake and asleep EEG is abnormal due the presence of generalized 4-5 Hz spike and polyspike and wave discharges with frontal predominance.  Clinical Correlation of the above findings is consistent with the interictal expression of a primary generalized epilepsy. There were no electrographic seizures in this study. Clinical correlation is advised.   Ellouise Newer, M.D.

## 2017-05-21 NOTE — Progress Notes (Addendum)
PROGRESS NOTE  Jessica [name] KAJ:[id number] DOB: 03-18-1932 DOA: 05/20/2017 PCP: Mast, Man X, NP  HPI/Recap of past 24 hours:  She is more awake, still slow in answer questions but oriented x3,  She does not eat much, remain on ivf She report ordor in urine, but denies ab pain, no dysuria, no fever  Assessment/Plan: Principal Problem:   Encephalopathy acute Active Problems:   COPD (chronic obstructive pulmonary disease) (HCC)   Hypertension   Hyperlipemia   Tobacco abuse   PVD (peripheral vascular disease) (HCC)   GERD (gastroesophageal reflux disease)   Seizures (HCC)   Peripheral vascular disease ([place])   Anxiety   Hyperlipidemia   Alcohol abuse  Acute encephalopathy CT head/MRI brain no acute finding (does has old infarcts) Hiv/b12/rpr/ammonia/tsh unremarkable EEG pending Improving  UTI? Report odor, but denies dysuria, no pain, no fever, no leukocytosis, urine culture pending  H/o Seizure Continue home meds lamicatal  hypokalemia Replace k, check mag  HTN: stable on home meds  COPD: no wheezing, cxr no acute findings  tabacco dependence: cessation education provided  FTT: will get PT/OT  Code Status: DNR  Family Communication: patient   Disposition Plan: pending   Consultants:  none  Procedures:  EEG  Antibiotics:  none   Objective: BP (!) 116/54 (BP Location: Right Arm)   Pulse 96   Temp 98.6 F (37 C) (Oral)   Resp 20   Ht 5\' 5"  (1.651 m)   Wt 45 kg (99 lb 3.2 oz)   SpO2 97%   BMI 16.51 kg/m   Intake/Output Summary (Last 24 hours) at 05/21/17 0813 Last data filed at 05/21/17 0400  Gross per 24 hour  Intake           583.33 ml  Output              500 ml  Net            83.33 ml   Filed Weights   05/20/17 2147  Weight: 45 kg (99 lb 3.2 oz)    Exam: Patient is examined daily including today on 05/21/2017, exams remain the same as of yesterday except that has changed    General:  Alert, slow in answering  questions but NAD  Cardiovascular: RRR  Respiratory: CTABL  Abdomen: Soft/ND/NT, positive BS  Musculoskeletal: No Edema  Neuro: alert, orientedx3, does has impaired memory   Data Reviewed: Basic Metabolic Panel:  Recent Labs Lab 05/20/17 1252 05/20/17 2151 05/21/17 0324  NA 140  --  138  K 3.8  --  3.0*  CL 109  --  108  CO2 22  --  22  GLUCOSE 105*  --  80  BUN 17  --  16  CREATININE 1.13*  --  0.88  CALCIUM 9.5  --  8.5*  MG  --  1.9  --    Liver Function Tests:  Recent Labs Lab 05/20/17 1252 05/20/17 2151 05/21/17 0324  AST 18 19 14*  ALT 12* 12* 8*  ALKPHOS 82 85 68  BILITOT 0.6 0.8 1.0  PROT 6.8 7.2 5.6*  ALBUMIN 3.9 3.9 3.2*   No results for input(s): LIPASE, AMYLASE in the last 168 hours.  Recent Labs Lab 05/20/17 1754  AMMONIA 36*   CBC:  Recent Labs Lab 05/20/17 1252 05/21/17 0324  WBC 8.8 6.6  NEUTROABS 7.0  --   HGB 13.7 12.1  HCT 42.1 37.7  MCV 89.0 88.9  PLT 234 221   Cardiac Enzymes:  Recent Labs Lab 05/20/17 1754  CKTOTAL 81   BNP (last 3 results) No results for input(s): BNP in the last 8760 hours.  ProBNP (last 3 results) No results for input(s): PROBNP in the last 8760 hours.  CBG:  Recent Labs Lab 05/21/17 0808  GLUCAP 129*    No results found for this or any previous visit (from the past 240 hour(s)).   Studies: Ct Head Wo Contrast  Result Date: 05/20/2017 CLINICAL DATA:  Last seen normal yesterday afternoon, seizure patient, possible fall EXAM: CT HEAD WITHOUT CONTRAST CT CERVICAL SPINE WITHOUT CONTRAST TECHNIQUE: Multidetector CT imaging of the head and cervical spine was performed following the standard protocol without intravenous contrast. Multiplanar CT image reconstructions of the cervical spine were also generated. COMPARISON:  MRI brain dated 10/10/2011.  CT head dated 10/03/2011. FINDINGS: CT HEAD FINDINGS Brain: No evidence of acute infarction, hemorrhage, hydrocephalus, extra-axial collection  or mass lesion/mass effect. Subcortical white matter and periventricular small vessel ischemic changes. Vascular: Intracranial atherosclerosis. Skull: Normal. Negative for fracture or focal lesion. Sinuses/Orbits: Minimal partial opacification of the right maxillary sinus. Mastoid air cells are clear. Other: None. CT CERVICAL SPINE FINDINGS Alignment: Normal cervical lordosis. Skull base and vertebrae: No acute fracture. No primary bone lesion or focal pathologic process. Soft tissues and spinal canal: No prevertebral fluid or swelling. No visible canal hematoma. Disc levels: Mild degenerative changes of the mid/lower cervical spine. Spinal canal is patent. Upper chest: Moderate centrilobular emphysematous changes with biapical pleural-parenchymal scarring. Other: Visualized thyroid is unremarkable IMPRESSION: No evidence of acute intracranial abnormality. Mild small vessel ischemic changes. No evidence of traumatic injury to the cervical spine. Electronically Signed   By: Julian Hy M.D.   On: 05/20/2017 14:07   Ct Cervical Spine Wo Contrast  Result Date: 05/20/2017 CLINICAL DATA:  Last seen normal yesterday afternoon, seizure patient, possible fall EXAM: CT HEAD WITHOUT CONTRAST CT CERVICAL SPINE WITHOUT CONTRAST TECHNIQUE: Multidetector CT imaging of the head and cervical spine was performed following the standard protocol without intravenous contrast. Multiplanar CT image reconstructions of the cervical spine were also generated. COMPARISON:  MRI brain dated 10/10/2011.  CT head dated 10/03/2011. FINDINGS: CT HEAD FINDINGS Brain: No evidence of acute infarction, hemorrhage, hydrocephalus, extra-axial collection or mass lesion/mass effect. Subcortical white matter and periventricular small vessel ischemic changes. Vascular: Intracranial atherosclerosis. Skull: Normal. Negative for fracture or focal lesion. Sinuses/Orbits: Minimal partial opacification of the right maxillary sinus. Mastoid air cells are  clear. Other: None. CT CERVICAL SPINE FINDINGS Alignment: Normal cervical lordosis. Skull base and vertebrae: No acute fracture. No primary bone lesion or focal pathologic process. Soft tissues and spinal canal: No prevertebral fluid or swelling. No visible canal hematoma. Disc levels: Mild degenerative changes of the mid/lower cervical spine. Spinal canal is patent. Upper chest: Moderate centrilobular emphysematous changes with biapical pleural-parenchymal scarring. Other: Visualized thyroid is unremarkable IMPRESSION: No evidence of acute intracranial abnormality. Mild small vessel ischemic changes. No evidence of traumatic injury to the cervical spine. Electronically Signed   By: Julian Hy M.D.   On: 05/20/2017 14:07   Mr Brain Wo Contrast  Result Date: 05/20/2017 CLINICAL DATA:  Found down. History of seizures, hyperlipidemia, hypertension, alcohol abuse, atrial fibrillation. EXAM: MRI HEAD WITHOUT CONTRAST TECHNIQUE: Multiplanar, multiecho pulse sequences of the brain and surrounding structures were obtained without intravenous contrast. COMPARISON:  CT HEAD May 20, 2017 at 1356 hours FINDINGS: Moderately motion degraded examination. BRAIN: No reduced diffusion to suggest acute ischemia. No susceptibility artifact to suggest  hemorrhage. The ventricles and sulci are normal for patient's age. Old small bilateral cerebellar infarcts. Patchy supratentorial white matter FLAIR T2 hyperintensities. Prominent deep gray nuclei perivascular spaces associated with chronic small vessel ischemic disease. Old bilateral thalamus lacunar infarcts. No suspicious parenchymal signal, mass or mass effect. No abnormal extra-axial fluid collections. VASCULAR: Normal major intracranial vascular flow voids present at skull base. SKULL AND UPPER CERVICAL SPINE: No abnormal sellar expansion. No suspicious calvarial bone marrow signal. Craniocervical junction maintained. SINUSES/ORBITS: Scattered mucosal retention cyst.  Mastoid air cells are well aerated. The included ocular globes and orbital contents are non-suspicious. Status post bilateral ocular lens implants. OTHER: Patient is edentulous. IMPRESSION: 1. No acute intracranial process on this moderately motion degraded examination. 2. Mild chronic small vessel ischemic disease. 3. Old small cerebellar and thalami infarcts. Electronically Signed   By: Elon Alas M.D.   On: 05/20/2017 20:46   Dg Chest Port 1 View  Result Date: 05/21/2017 CLINICAL DATA:  Shortness of breath.  Acute encephalopathy EXAM: PORTABLE CHEST 1 VIEW COMPARISON:  Yesterday FINDINGS: COPD with interstitial coarsening and emphysematous changes. Mild atelectasis at the bases. Calcified granuloma in the peripheral right lung. Normal heart size. Stable aortic tortuosity. IMPRESSION: COPD and minimal atelectasis.  No significant change from yesterday. Electronically Signed   By: Monte Fantasia M.D.   On: 05/21/2017 07:50   Dg Chest Port 1 View  Addendum Date: 05/20/2017   ADDENDUM REPORT: 05/20/2017 14:17 ADDENDUM: The last sentence in the impression should read:No focal infiltrate. Electronically Signed   By: Marcello Moores  Register   On: 05/20/2017 14:17   Result Date: 05/20/2017 CLINICAL DATA:  07/24/2015. EXAM: PORTABLE CHEST 1 VIEW COMPARISON:  07/24/2015. FINDINGS: Mediastinum hilar structures are normal. Cardiomegaly with normal pulmonary vascularity. No focal infiltrate. Punctate calcific density over the right mid lung consistent granuloma again noted. Chronic interstitial changes noted. Biapical pleuroparenchymal thickening consistent scarring No pleural effusion or pneumothorax. Skin fold noted on the left. Surgical screws left humerus and old right proximal humeral fracture again noted. IMPRESSION: 1. Cardiomegaly.  No pulmonary venous congestion. 2. Chronic interstitial lung disease. Biapical pleural-parenchymal thickening consistent scarring. A focal infiltrate. Electronically Signed: ByMarcello Moores  Register On: 05/20/2017 13:34    Scheduled Meds: . aspirin EC  81 mg Oral Daily  . atorvastatin  20 mg Oral q1800  . enoxaparin (LOVENOX) injection  40 mg Subcutaneous Q24H  . feeding supplement  1 Container Oral TID BM  . lamoTRIgine  150 mg Oral BH-q7a  . lamoTRIgine  200 mg Oral QHS  . losartan  100 mg Oral Daily  . metoprolol succinate  50 mg Oral Daily  . mometasone-formoterol  2 puff Inhalation BID  . nicotine  14 mg Transdermal Daily  . potassium chloride  40 mEq Oral Q4H  . sodium chloride flush  3 mL Intravenous Q12H  . tiotropium  18 mcg Inhalation Daily  . zonisamide  100 mg Oral Daily  . zonisamide  200 mg Oral QHS    Continuous Infusions: . sodium chloride 100 mL/hr at 05/21/17 0759  . sodium chloride       Time spent: 53mins I have personally reviewed and interpreted on  05/21/2017 daily labs, tele strips, imagings as discussed above under date review session and assessment and plans. Outside records and past hospitalization record obtained and reviewed. I have discussed plan of care as described above with RN , patient and case manager on 05/21/2017   Davette Nugent MD, PhD  [facility] Pager [phone].  If 7PM-7AM, please contact night-coverage at [email or web address], password [facility] 05/21/2017, 8:13 AM  LOS: 0 days

## 2017-05-21 NOTE — Plan of Care (Signed)
Problem: Safety: Goal: Ability to remain free from injury will improve Outcome: Progressing Pt will be free from falls and injuries during this hospitalization.  Problem: Activity: Goal: Risk for activity intolerance will decrease Outcome: Progressing Pt's tolerance for activity will increase prior to discharge.

## 2017-05-21 NOTE — Progress Notes (Signed)
SLP Cancellation Note  Patient Details Name: Jessica [name] MRN: [id number] DOB: [birth year]   Cancelled treatment:       Reason Eval/Treat Not Completed: Patient at procedure or test/unavailable. Unable to complete BSE/SLE at this time, as pt is currently off unit for testing. Per RN, pt is tolerating current diet without overt difficulty. Will continue efforts to evaluate com/cog status.  Jessica [name] [facility], CCC-SLP Speech Pathologist [phone]  Shonna Chock 05/21/2017, 10:57 AM

## 2017-05-21 NOTE — Progress Notes (Signed)
EEG completed, results pending. 

## 2017-05-21 NOTE — Evaluation (Signed)
Occupational Therapy Evaluation Patient Details Name: Jessica [name] MRN: [id number] DOB: 1932-03-11 Today's Date: 05/21/2017    History of Present Illness Pt is an 83 y/o female admitted secondary to fall and acute encephalopathy. CT of the head and spine, negative for acute abnormality. MRI negative for acute abnormality. EEG revealed consistent findings with epilepsy. PMH includes PVD, COPD, CVA, melanoma, colon cancer, seizures, HTN, depression, and tobacco abuse.    Clinical Impression   Pt was independent prior to admission. Reports poor appetite and not utilizing dining room at [facility] much. Pt presents with impaired cognition--memory and safety awareness, and decreased standing balance. She was significantly safer with RW. Daughter in room and is aware of pt's deficits and need for 24 hr supervision initially upon returning home. Pt is a good candidate for the [facility] program as she lives alone. Will follow.    Follow Up Recommendations  Home health OT;Supervision/Assistance - 24 hour ([facility])    Equipment Recommendations  Other (comment) (defer to [facility])    Recommendations for Other Services       Precautions / Restrictions Precautions Precautions: Fall Precaution Comments: Had fall prior to admission.  Restrictions Weight Bearing Restrictions: No      Mobility Bed Mobility Overal bed mobility: Modified Independent             General bed mobility comments: Increased time, however, no assist required. Use of bed rails for trunk elevation.  Transfers Overall transfer level: Needs assistance Equipment used: Rolling walker (2 wheeled) Transfers: Sit to/from Stand Sit to Stand: Min guard         General transfer comment: Min guard for safety.     Balance Overall balance assessment: Needs assistance Sitting-balance support: No upper extremity supported;Feet supported Sitting balance-Leahy Scale: Good Sitting balance - Comments: no LOB with  donning socks   Standing balance support: During functional activity;No upper extremity supported;Bilateral upper extremity supported Standing balance-Leahy Scale: Poor Standing balance comment: Reliant on UE support or external support to maintain balance.                  Standardized Balance Assessment Standardized Balance Assessment : Dynamic Gait Index   Dynamic Gait Index Level Surface: Mild Impairment Gait with Horizontal Head Turns: Moderate Impairment Gait and Pivot Turn: Moderate Impairment     ADL either performed or assessed with clinical judgement   ADL Overall ADL's : Needs assistance/impaired Eating/Feeding: Independent;Bed level   Grooming: Wash/dry hands;Standing;Min guard   Upper Body Bathing: Set up;Sitting   Lower Body Bathing: Min guard;Sit to/from stand   Upper Body Dressing : Set up;Sitting   Lower Body Dressing: Min guard;Sit to/from stand   Toilet Transfer: Minimal assistance;Ambulation   Toileting- Clothing Manipulation and Hygiene: Minimal assistance;Sit to/from stand       Functional mobility during ADLs: Minimal assistance;Rolling walker;Cueing for safety       Vision Baseline Vision/History: No visual deficits Patient Visual Report: No change from baseline       Perception     Praxis      Pertinent Vitals/Pain Pain Assessment: Faces Faces Pain Scale: Hurts a little bit Pain Location: LLE  Pain Descriptors / Indicators: Aching Pain Intervention(s): Monitored during session;Limited activity within patient's tolerance     Hand Dominance Right   Extremity/Trunk Assessment Upper Extremity Assessment Upper Extremity Assessment: Overall WFL for tasks assessed (arthritic changes in hands)   Lower Extremity Assessment Lower Extremity Assessment: Defer to PT evaluation RLE Deficits / Details: Noted  skin graft sites at knee. Pt reporting increased stiffness with ambulation. Reports numbness from knee to thigh as well from  previous surgery.    Cervical / Trunk Assessment Cervical / Trunk Assessment: Kyphotic   Communication Communication Communication: No difficulties   Cognition Arousal/Alertness: Awake/alert Behavior During Therapy: WFL for tasks assessed/performed Overall Cognitive Status: Impaired/Different from baseline Area of Impairment: Safety/judgement;Problem solving;Memory                     Memory: Decreased short-term memory;Decreased recall of precautions   Safety/Judgement: Decreased awareness of safety;Decreased awareness of deficits   Problem Solving: Slow processing;Requires verbal cues;Requires tactile cues General Comments: Pt with STM deficits noted during session. Asked when we took off oxygen and had to remind her it was taken off before session. Decreased safety awareness with use of DME as well.    General Comments  Pt's daughter present throughout session and assisted with history. Sats >90% throughout session without oxygen.     Exercises     Shoulder Instructions      Home Living Family/patient expects to be discharged to:: Private residence Living Arrangements: Alone Available Help at Discharge: Family Type of Home: Independent living facility Home Access: Elevator     Home Layout: One level     Bathroom Shower/Tub: Occupational psychologist: Standard     Home Equipment: Grab bars - tub/shower      Lives With: Alone    Prior Functioning/Environment Level of Independence: Independent                 OT Problem List: Impaired balance (sitting and/or standing);Decreased safety awareness;Decreased knowledge of use of DME or AE;Cardiopulmonary status limiting activity;Pain      OT Treatment/Interventions: Self-care/ADL training;Neuromuscular education;DME and/or AE instruction;Patient/family education;Balance training;Therapeutic activities;Cognitive remediation/compensation    OT Goals(Current goals can be found in the care plan  section) Acute Rehab OT Goals Patient Stated Goal: to go back to "the home" OT Goal Formulation: With patient Time For Goal Achievement: 05/28/17 Potential to Achieve Goals: Good ADL Goals Pt Will Perform Grooming: with supervision;standing Pt Will Perform Lower Body Bathing: with supervision;sit to/from stand Pt Will Perform Lower Body Dressing: with supervision;sit to/from stand Pt Will Transfer to Toilet: with supervision;ambulating;regular height toilet Pt Will Perform Toileting - Clothing Manipulation and hygiene: with supervision;sit to/from stand  OT Frequency: Min 2X/week   Barriers to D/C: Decreased caregiver support          Co-evaluation              AM-PAC PT "6 Clicks" Daily Activity     Outcome Measure Help from another person eating meals?: None Help from another person taking care of personal grooming?: A Little Help from another person toileting, which includes using toliet, bedpan, or urinal?: A Little Help from another person bathing (including washing, rinsing, drying)?: A Little Help from another person to put on and taking off regular upper body clothing?: None Help from another person to put on and taking off regular lower body clothing?: A Little 6 Click Score: 20   End of Session Equipment Utilized During Treatment: Gait belt;Rolling walker Nurse Communication: Other (comment) (02 sats at 92% on RA, left 02 off)  Activity Tolerance: Patient tolerated treatment well Patient left: in bed;with call bell/phone within reach;with bed alarm set;with family/visitor present  OT Visit Diagnosis: Unsteadiness on feet (R26.81);Other abnormalities of gait and mobility (R26.89);History of falling (Z91.81);Muscle weakness (generalized) (M62.81);Other symptoms and signs involving  cognitive function;Pain                Time: 1443-1500 OT Time Calculation (min): 17 min Charges:  OT General Charges $OT Visit: 1 Procedure OT Evaluation $OT Eval Low Complexity: 1  Procedure G-Codes: OT G-codes **NOT FOR INPATIENT CLASS** Functional Assessment Tool Used: Clinical judgement Functional Limitation: Self care Self Care Current Status (X4356): At least 20 percent but less than 40 percent impaired, limited or restricted Self Care Goal Status (Y6168): At least 1 percent but less than 20 percent impaired, limited or restricted   05/21/2017 Nestor Lewandowsky, OTR/L Pager: [phone]  Malka So 05/21/2017, 4:01 PM

## 2017-05-21 NOTE — Evaluation (Signed)
Speech Language Pathology Evaluation Patient Details Name: Jessica [name] MRN: [id number] DOB: May 12, 1932 Today's Date: 05/21/2017 Time: 7253-6644 SLP Time Calculation (min) (ACUTE ONLY): 25 min  Problem List:  Patient Active Problem List   Diagnosis Date Noted  . Encephalopathy acute 05/20/2017  . Loss of weight 01/18/2015  . Osteoarthritis of finger 02/16/2014  . Seizures ([place])   . Peripheral vascular disease ([place])   . Anxiety   . Hyperlipidemia   . Cancer ([place])   . Confusion   . Alcohol abuse   . Tachycardia   . Generalized convulsive epilepsy ([place])   . GERD (gastroesophageal reflux disease) 02/03/2013  . Acute exacerbation of chronic obstructive pulmonary disease (COPD) ([place]) 10/07/2011  . Encephalopathy 10/03/2011  . PVD (peripheral vascular disease) ([place]) 10/03/2011  . Personal history of colon cancer 10/03/2011  . Bronchitis 09/26/2011  . COPD (chronic obstructive pulmonary disease) ([place]) 09/26/2011  . Hypertension 09/26/2011  . Hyperlipemia 09/26/2011  . Tobacco abuse 09/26/2011  . Diarrhea 09/26/2011  . Bone infection of left hand ([place]) 12/22/2008   Past Medical History:  Past Medical History:  Diagnosis Date  . Alcohol abuse 11/11/2010  . Alcohol abuse, unspecified   . Anxiety   . Atrial fibrillation ([place]) 11/11/2010  . Bone infection of left hand ([place]) 12/22/2008   secondary to injury  . Cancer ([place])    colon cancer-hx of chemo  . Chronic airway obstruction, not elsewhere classified 10/01/2011  . Confusion   . COPD (chronic obstructive pulmonary disease) ([place])   . Dizziness and giddiness 04/24/2005  . Edema 12/03/2011  . Embolism - blood clot 1996   left breast  . Generalized convulsive epilepsy without mention of intractable epilepsy   . GERD (gastroesophageal reflux disease)   . Hemorrhoids   . Hyperlipidemia   . Hypertension   . Insomnia, unspecified 11/11/2010  . Major depressive disorder, single episode, unspecified 11/11/2010  . Malignant  neoplasm of stomach, unspecified site 11/11/2010  . Melanoma ([place])    metastatic  . Peripheral vascular disease ([place])   . Seizures ([place])   . Symptomatic menopausal or female climacteric states 11/11/2010  . Tachycardia, unspecified   . Tobacco use disorder   . Urgency of urination    Past Surgical History:  Past Surgical History:  Procedure Laterality Date  . ABDOMINAL HYSTERECTOMY  1963  . BLADDER REPAIR    . breast biopsies     x2 on left, x 1 on right  . c-sections  1957, 1959, 1961   x3  . COLON RESECTION  1995  . COLONOSCOPY  10/21/2012   Dr. Paulita Fujita diverticulosis, sessile polyps biopsy   . heart ablation  1997  . HEMORRHOID SURGERY    . laser surgery to right eye  10/04/2007  . left femoral artery stent  05/07/2004  . melanoma removed from left groin  09/2004  . melanoma removed from left leg  09/06/2004  . orif left humerus fracture  03/27/2005  . porta cath placement  1995  . porta cath removal  1996  . removal of tumor in left knee  11/15/2010  . skin graft to left leg  12/05/2004  . VOCAL CORD INJECTION  1996, 19911   HPI:  83 year old female admitted 05/20/17 with AMS, confusion. PMH significant for GERD, seizures, colon CA, HTN, HLD, PVD, etoh abuse. CXR 8/23 = COPD, atelectasis, MRI = no acute processes, old cerebellar and thalami infarcts.   Assessment / Plan / Recommendation Clinical Impression  The [facility] Cognitive  Assessment (MoCA) was administered. Pt scored 23/30 (n=26+/30), indicating mild cognitive deficits. Pt exhibited difficulty on executive function, immediate and delayed recall, thought organization, calculation, abstract reasoning and selective attention tasks.  While this performance is not significantly impaired, it raises concern for pt safety, as she lives alone. Educated pt and daughter on test results and possible functional impact, and encouraged them to monitor pt closely once she returns to her normal routine. Pt has shown improvement overall during this  admission, and may continue to exhibit improvement in cognitive status. No further ST intervention recommended at this venue.    SLP Assessment  SLP Recommendation/Assessment: All further Speech Language Pathology  needs can be addressed in the next venue of care SLP Visit Diagnosis: Cognitive communication deficit (R41.841)    Follow Up Recommendations  Home health SLP (if needs arise)       SLP Evaluation Cognition  Overall Cognitive Status: Impaired/Different from baseline Arousal/Alertness: Awake/alert Orientation Level: Oriented X4 Attention: Focused;Sustained;Selective;Alternating;Divided Focused Attention: Appears intact Sustained Attention: Appears intact Selective Attention: Impaired Selective Attention Impairment: Verbal basic Alternating Attention: Impaired Alternating Attention Impairment: Verbal basic Divided Attention: Impaired Divided Attention Impairment: Verbal basic Memory: Impaired Memory Impairment: Storage deficit;Retrieval deficit;Decreased recall of new information Problem Solving: Impaired Problem Solving Impairment: Verbal basic Executive Function: Reasoning;Organizing Reasoning: Impaired Reasoning Impairment: Verbal basic Organizing: Impaired Organizing Impairment: Verbal basic       Comprehension  Auditory Comprehension Overall Auditory Comprehension: Appears within functional limits for tasks assessed    Expression Expression Primary Mode of Expression: Verbal Verbal Expression Overall Verbal Expression: Appears within functional limits for tasks assessed Written Expression Dominant Hand: Right   Oral / Motor  Oral Motor/Sensory Function Overall Oral Motor/Sensory Function: Within functional limits Motor Speech Overall Motor Speech: Appears within functional limits for tasks assessed   GO          Functional Assessment Tool Used: asha noms, clinical judgment, MoCA Functional Limitations: Memory Memory Current Status (H9977): At least  20 percent but less than 40 percent impaired, limited or restricted Memory Goal Status (S1423): At least 20 percent but less than 40 percent impaired, limited or restricted Memory Discharge Status [phone]): At least 20 percent but less than 40 percent impaired, limited or restricted         Celia B. Quentin Ore [facility], CCC-SLP Speech Pathologist [phone]  Shonna Chock 05/21/2017, 2:11 PM

## 2017-05-21 NOTE — Evaluation (Signed)
Physical Therapy Evaluation Patient Details Name: Jessica [name] MRN: [id number] DOB: [birth year] Today's Date: 05/21/2017   History of Present Illness  Pt is an 83 y/o female admitted secondary to fall and acute encephalopathy. CT of the head and spine, negative for acute abnormality. MRI negative for acute abnormality. EEG revealed consistent findings with epilepsy. PMH includes PVD, COPD, CVA, melanoma, colon cancer, seizures, HTN, depression, and tobacco abuse.   Clinical Impression  Pt admitted secondary to problem above with deficits below. PTA, pt was independent with functional mobility and living at [facility]. Upon eval, pt very unsteady during gait and required min A for balance. With use of RW, pt with increased stability, however, required multiple safety cues throughout for safe use of device. Pt also presenting with cognitive deficits, as well. Discussed need for increased assist for short term secondary to decreased balance. Feel pt would be good candidate for home first program to increase independence and safety with mobility, as pt was very independent prior to admission and living alone. Will continue to follow acutely to maximize functional mobility independence and safety.     Follow Up Recommendations Home health PT;Supervision/Assistance - 24 hour (feel appropriate for Home First program )    Equipment Recommendations  Rolling walker with 5" wheels    Recommendations for Other Services       Precautions / Restrictions Precautions Precautions: Fall Precaution Comments: Had fall prior to admission.  Restrictions Weight Bearing Restrictions: No      Mobility  Bed Mobility Overal bed mobility: Modified Independent             General bed mobility comments: Increased time, however, no assist required. Use of bed rails for trunk elevation.  Transfers Overall transfer level: Needs assistance Equipment used: None Transfers: Sit to/from Stand Sit to  Stand: Min guard         General transfer comment: Min guard for safety.   Ambulation/Gait Ambulation/Gait assistance: Min assist Ambulation Distance (Feet): 150 Feet Assistive device: Rolling walker (2 wheeled);None Gait Pattern/deviations: Step-through pattern;Decreased stride length;Staggering left Gait velocity: Decreased Gait velocity interpretation: Below normal speed for age/gender General Gait Details: Slow, unsteady gait without use of AD. Required min A secondary to multiple LOB's. Gave RW and pt with increased stability, however, continued to have some instability. Required cues for safe use of RW, especially when turning. Also took one hand off of RW during gait, and required cues to keep both hands on RW during gait.   Stairs            Wheelchair Mobility    Modified Rankin (Stroke Patients Only)       Balance Overall balance assessment: Needs assistance Sitting-balance support: No upper extremity supported;Feet supported Sitting balance-Leahy Scale: Fair     Standing balance support: During functional activity;No upper extremity supported;Bilateral upper extremity supported Standing balance-Leahy Scale: Poor Standing balance comment: Reliant on UE support or external support to maintain balance.                  Standardized Balance Assessment Standardized Balance Assessment : Dynamic Gait Index   Dynamic Gait Index Level Surface: Mild Impairment Gait with Horizontal Head Turns: Moderate Impairment Gait and Pivot Turn: Moderate Impairment       Pertinent Vitals/Pain Pain Assessment: Faces Faces Pain Scale: Hurts a little bit Pain Location: LLE  Pain Descriptors / Indicators: Aching Pain Intervention(s): Limited activity within patient's tolerance;Monitored during session;Repositioned    Home Living Family/patient expects to  be discharged to:: Private residence   Available Help at Discharge: Family Type of Home: Independent living  facility Home Access: Elevator     Home Layout: One level Home Equipment: None      Prior Function Level of Independence: Independent               Hand Dominance   Dominant Hand: Right    Extremity/Trunk Assessment   Upper Extremity Assessment Upper Extremity Assessment: Defer to OT evaluation    Lower Extremity Assessment Lower Extremity Assessment: RLE deficits/detail;Generalized weakness RLE Deficits / Details: Noted skin graft sites at knee. Pt reporting increased stiffness with ambulation. Reports numbness from knee to thigh as well from previous surgery.     Cervical / Trunk Assessment Cervical / Trunk Assessment: Kyphotic  Communication   Communication: No difficulties  Cognition Arousal/Alertness: Awake/alert Behavior During Therapy: WFL for tasks assessed/performed Overall Cognitive Status: Impaired/Different from baseline Area of Impairment: Safety/judgement;Problem solving;Memory                     Memory: Decreased short-term memory;Decreased recall of precautions   Safety/Judgement: Decreased awareness of safety;Decreased awareness of deficits   Problem Solving: Slow processing;Requires verbal cues;Requires tactile cues General Comments: Pt with STM deficits noted during session. Asked when we took off oxygen and had to remind her it was taken off before session. Decreased safety awareness with use of DME as well.       General Comments General comments (skin integrity, edema, etc.): Pt's daughter present throughout session and assisted with history. Sats >90% throughout session without oxygen.     Exercises     Assessment/Plan    PT Assessment Patient needs continued PT services  PT Problem List Decreased strength;Decreased balance;Decreased mobility;Decreased cognition;Decreased knowledge of use of DME;Decreased safety awareness;Decreased knowledge of precautions       PT Treatment Interventions DME instruction;Gait  training;Functional mobility training;Therapeutic activities;Therapeutic exercise;Balance training;Neuromuscular re-education;Cognitive remediation;Patient/family education    PT Goals (Current goals can be found in the Care Plan section)  Acute Rehab PT Goals Patient Stated Goal: to go back to "the home" PT Goal Formulation: With patient Time For Goal Achievement: 05/28/17 Potential to Achieve Goals: Good    Frequency Min 3X/week   Barriers to discharge Decreased caregiver support Lives alone     Co-evaluation               AM-PAC PT "6 Clicks" Daily Activity  Outcome Measure Difficulty turning over in bed (including adjusting bedclothes, sheets and blankets)?: A Little Difficulty moving from lying on back to sitting on the side of the bed? : A Little Difficulty sitting down on and standing up from a chair with arms (e.g., wheelchair, bedside commode, etc,.)?: Unable Help needed moving to and from a bed to chair (including a wheelchair)?: A Little Help needed walking in hospital room?: A Little Help needed climbing 3-5 steps with a railing? : A Little 6 Click Score: 16    End of Session   Activity Tolerance: Patient tolerated treatment well Patient left: in bed;with call bell/phone within reach;with family/visitor present;with bed alarm set Nurse Communication: Mobility status;Other (comment) (removal of oxygen.) PT Visit Diagnosis: Unsteadiness on feet (R26.81)    Time: 2585-2778 PT Time Calculation (min) (ACUTE ONLY): 26 min   Charges:   PT Evaluation $PT Eval Low Complexity: 1 Low PT Treatments $Gait Training: 8-22 mins   PT G Codes:   PT G-Codes **NOT FOR INPATIENT CLASS** Functional Assessment Tool Used: AM-PAC  6 Clicks Basic Mobility;Clinical judgement Functional Limitation: Mobility: Walking and moving around Mobility: Walking and Moving Around Current Status [phone]): At least 40 percent but less than 60 percent impaired, limited or restricted Mobility:  Walking and Moving Around Goal Status [phone]): At least 1 percent but less than 20 percent impaired, limited or restricted    Leighton Ruff, PT, DPT  Acute Rehabilitation Services  Pager: [place] 05/21/2017, 3:44 PM

## 2017-05-21 NOTE — Progress Notes (Signed)
Patient lives at [facility].   Jessica [name] LCSWA [phone]

## 2017-05-21 NOTE — Progress Notes (Signed)
Pt arrived to [facility]. Identified appropriately, VS collected and stable, pt in no acute distress. Alert and oriented to place and self, daughter at bedside. Pt placed on cardiac monitor.  Pt and family educated, advised about valuable policy and oriented to room and equipment. Bed alarm in place, pt instructed to use call bell for assistance, and call bell left within reach. Will continue to monitor and treat pt per MD orders.

## 2017-05-21 NOTE — Progress Notes (Signed)
Pt urinated first time since admission, urine malodorous and cloudy, no burning nor pain with urination.  At 0400, pt called RN for assistance with bathroom, at this time pt reported having seizure activity earlier, mental status unchanged since admission, disoriented to time and situation. Pt on continuous puls ox and cardiac monitor, no evidence of SpO2 desaturation.  Dr. Hal Hope notified, received verbal order for urine culture.  Will continue to monitor and treat pt per MD orders.

## 2017-05-21 NOTE — Progress Notes (Signed)
Initial Nutrition Assessment  DOCUMENTATION CODES:   Severe malnutrition in context of chronic illness  INTERVENTION:  1. Ordered snacks 2. Magic cup TID with meals, each supplement provides 290 kcal and 9 grams of protein  NUTRITION DIAGNOSIS:   Malnutrition related to social / environmental circumstances as evidenced by percent weight loss, severe depletion of muscle mass, severe depletion of body fat.  GOAL:   Patient will meet greater than or equal to 90% of their needs  MONITOR:   PO intake, I & O's, Labs, Weight trends  REASON FOR ASSESSMENT:   Malnutrition Screening Tool    ASSESSMENT:   Jessica [name] has PMHHx of COPD with tobacco use, anxiety, seizure, colon cancer s/p chemo, melanoma s/p treatment, GERD, HLD, HTN, MDD, PVD comes from independent living living facility with AMS, acute encephalopathy.  Spoke with patient and daughter at bedside. Patient comes from [facility], buffet style meals. Patient does not eat much there, often snacks on candied pecans, pepperjack cheese straws, and snickers and butterfingers. She reports losing 20 pounds in 1 year. When I asked what was the cause she said "I lost my youngest son, now that you know I don't want to hear anything from you about it." PO intake has been ok, had a big mac and fries for lunch that her daughter brought.  PO 50% for breakfast. No nausea/vomiting She is picky with foods, does not like ONS but was agreeable to eating cheese and crackers and magic cup. 572m UOP yesterday  Nutrition-Focused physical exam completed. Findings are severe fat depletion, severe muscle depletion, and no edema.   Labs reviewed:  K 3.0,  Medications reviewed and include:   40 K+ Q4H x3 NS at 1047mhr   Diet Order:  Diet Heart Room service appropriate? Yes; Fluid consistency: Thin  Skin:  Wound (see comment) (skin graft to BL lower extremities)  Last BM:  05/20/2017  Height:   Ht Readings from Last 1 Encounters:   05/20/17 _0  (1.651 m)    Weight:   Wt Readings from Last 1 Encounters:  05/20/17 99 lb 3.2 oz (45 kg)    Ideal Body Weight:  56.81 kg  BMI:  Body mass index is 16.51 kg/m.  Estimated Nutritional Needs:   Kcal:  1350-1575 calories (30-35 cal/kg)  Protein:  68-77 grams (1.5-1.7g/kg)  Fluid:  1.3-1.6L  EDUCATION NEEDS:   No education needs identified at this time  Jessica AnisWard, MS, RD LDN Inpatient Clinical Dietitian Pager [phone]

## 2017-05-21 NOTE — Evaluation (Signed)
Clinical/Bedside Swallow Evaluation Patient Details  Name: Jessica [name] MRN: [id number] Date of Birth: Dec 09, 1931  Today's Date: 05/21/2017 Time: SLP Start Time (ACUTE ONLY): 1300 SLP Stop Time (ACUTE ONLY): 1320 SLP Time Calculation (min) (ACUTE ONLY): 20 min  Past Medical History:  Past Medical History:  Diagnosis Date  . Alcohol abuse 11/11/2010  . Alcohol abuse, unspecified   . Anxiety   . Atrial fibrillation ([place]) 11/11/2010  . Bone infection of left hand ([place]) 12/22/2008   secondary to injury  . Cancer ([place])    colon cancer-hx of chemo  . Chronic airway obstruction, not elsewhere classified 10/01/2011  . Confusion   . COPD (chronic obstructive pulmonary disease) ([place])   . Dizziness and giddiness 04/24/2005  . Edema 12/03/2011  . Embolism - blood clot 1996   left breast  . Generalized convulsive epilepsy without mention of intractable epilepsy   . GERD (gastroesophageal reflux disease)   . Hemorrhoids   . Hyperlipidemia   . Hypertension   . Insomnia, unspecified 11/11/2010  . Major depressive disorder, single episode, unspecified 11/11/2010  . Malignant neoplasm of stomach, unspecified site 11/11/2010  . Melanoma ([place])    metastatic  . Peripheral vascular disease ([place])   . Seizures ([place])   . Symptomatic menopausal or female climacteric states 11/11/2010  . Tachycardia, unspecified   . Tobacco use disorder   . Urgency of urination    Past Surgical History:  Past Surgical History:  Procedure Laterality Date  . ABDOMINAL HYSTERECTOMY  1963  . BLADDER REPAIR    . breast biopsies     x2 on left, x 1 on right  . c-sections  1957, 1959, 1961   x3  . COLON RESECTION  1995  . COLONOSCOPY  10/21/2012   Dr. Paulita Fujita diverticulosis, sessile polyps biopsy   . heart ablation  1997  . HEMORRHOID SURGERY    . laser surgery to right eye  10/04/2007  . left femoral artery stent  05/07/2004  . melanoma removed from left groin  09/2004  . melanoma removed from left leg  09/06/2004  .  orif left humerus fracture  03/27/2005  . porta cath placement  1995  . porta cath removal  1996  . removal of tumor in left knee  11/15/2010  . skin graft to left leg  12/05/2004  . VOCAL CORD INJECTION  1996, 19948   HPI:  83 year old female admitted 05/20/17 with AMS, confusion. PMH significant for GERD, seizures, colon CA, HTN, HLD, PVD, etoh abuse. CXR 8/23 = COPD, atelectasis, MRI = no acute processes, old cerebellar and thalami infarcts.   Assessment / Plan / Recommendation Clinical Impression  Pt presents with adequate oral motor strength and function. She was finishing lunuch upon arrival of SLP. Pt exhibits no overt s/s aspiration and reports no difficulty swallowing. She is at increased risk for silent aspiration given COPD. CXR =  COPD, minimal atelectasis. Objective study would be needed to rule out silent aspiration, however, pt politely declines MBS at this time.    No further ST intervention recommended at this time.  SLP Visit Diagnosis: Dysphagia, unspecified (R13.10)    Aspiration Risk  Moderate aspiration risk    Diet Recommendation Thin liquid;Regular   Liquid Administration via: Cup;Straw Medication Administration: Whole meds with liquid Supervision: Patient able to self feed Compensations: Minimize environmental distractions;Small sips/bites;Slow rate Postural Changes: Seated upright at 90 degrees;Remain upright for at least 30 minutes after po intake    Other  Recommendations Oral  Care Recommendations: Oral care BID       Prognosis Prognosis for Safe Diet Advancement: Good      Swallow Study   General Date of Onset: 05/20/17 HPI: 83 year old female admitted 05/20/17 with AMS, confusion. PMH significant for GERD, seizures, colon CA, HTN, HLD, PVD, etoh abuse. CXR 8/23 = COPD, atelectasis, MRI = no acute processes, old cerebellar and thalami infarcts. Type of Study: Bedside Swallow Evaluation Previous Swallow Assessment: BSE January 2013 = high aspiration risk  at that time. Pt hospitalized with PNA Diet Prior to this Study: Regular;Thin liquids Temperature Spikes Noted: No Respiratory Status: Nasal cannula History of Recent Intubation: No Behavior/Cognition: Alert;Cooperative;Pleasant mood Oral Cavity Assessment: Within Functional Limits Oral Care Completed by SLP: No Oral Cavity - Dentition: Adequate natural dentition Vision: Functional for self-feeding Self-Feeding Abilities: Able to feed self Patient Positioning: Upright in bed Baseline Vocal Quality: Normal Volitional Cough: Congested Volitional Swallow: Able to elicit    Oral/Motor/Sensory Function Overall Oral Motor/Sensory Function: Within functional limits   Ice Chips Ice chips: Not tested   Thin Liquid Thin Liquid: Within functional limits Presentation: Straw    Nectar Thick Nectar Thick Liquid: Not tested   Honey Thick Honey Thick Liquid: Not tested   Puree Puree: Within functional limits Presentation: Spoon;Self Fed   Solid   GO   Solid: Within functional limits Presentation: [place] B. Quentin Ore [facility], [place] Speech Pathologist [phone]  Shonna Chock 05/21/2017,2:04 PM

## 2017-05-22 ENCOUNTER — Inpatient Hospital Stay (HOSPITAL_COMMUNITY): Payer: Medicare Other

## 2017-05-22 DIAGNOSIS — I361 Nonrheumatic tricuspid (valve) insufficiency: Secondary | ICD-10-CM

## 2017-05-22 LAB — FOLATE RBC
FOLATE, HEMOLYSATE: 392 ng/mL
Folate, RBC: 933 ng/mL (ref >498)
Hematocrit: 42 % (ref 34.0–46.6)

## 2017-05-22 LAB — CBC
HEMATOCRIT: 36.7 % (ref 36.0–46.0)
HEMOGLOBIN: 11.5 g/dL — AB (ref 12.0–15.0)
MCH: 28.4 pg (ref 26.0–34.0)
MCHC: 31.3 g/dL (ref 30.0–36.0)
MCV: 90.6 fL (ref 78.0–100.0)
Platelets: 207 10*3/uL (ref 150–400)
RBC: 4.05 MIL/uL (ref 3.87–5.11)
RDW: 15 % (ref 11.5–15.5)
WBC: 6.2 10*3/uL (ref 4.0–10.5)

## 2017-05-22 LAB — ECHOCARDIOGRAM COMPLETE
HEIGHTINCHES: 65 in
WEIGHTICAEL: 1587.2 [oz_av]

## 2017-05-22 LAB — BASIC METABOLIC PANEL
Anion gap: 4 — ABNORMAL LOW (ref 5–15)
BUN: 10 mg/dL (ref 6–20)
CHLORIDE: 115 mmol/L — AB (ref 101–111)
CO2: 23 mmol/L (ref 22–32)
Calcium: 8.7 mg/dL — ABNORMAL LOW (ref 8.9–10.3)
Creatinine, Ser: 0.84 mg/dL (ref 0.44–1.00)
GFR calc Af Amer: >60 mL/min (ref >60)
GFR calc non Af Amer: >60 mL/min (ref >60)
Glucose, Bld: 102 mg/dL — ABNORMAL HIGH (ref 65–99)
POTASSIUM: 4.4 mmol/L (ref 3.5–5.1)
SODIUM: 142 mmol/L (ref 135–145)

## 2017-05-22 LAB — MRSA PCR SCREENING: MRSA by PCR: NEGATIVE

## 2017-05-22 LAB — MAGNESIUM: MAGNESIUM: 1.7 mg/dL (ref 1.7–2.4)

## 2017-05-22 MED ORDER — BOOST / RESOURCE BREEZE PO LIQD: 1.0000 | Freq: Three times a day (TID) | ORAL | 0 refills | Status: DC

## 2017-05-22 MED ORDER — SENNOSIDES-DOCUSATE SODIUM 8.6-50 MG PO TABS: 1.0000 | ORAL_TABLET | Freq: Every day | ORAL | 0 refills | Status: DC

## 2017-05-22 NOTE — Consult Note (Signed)
[facility] CM Primary Care Navigator  05/22/2017  Jessica [name] [facility] 11/07/1931 [id number]   Met with patientand daughter Jessica [name]) at the bedsideto identify possible discharge needs. Patient resides at [facility] ([place]) for years as reported. Patient reports that she "passed out" in the facility and had confusion that had led to this admission.   Patient shared that [facility] ([place]) sends her Lamictal (free) to her apartment through mail delivery and is using [facility] in [place] to obtain other medications without difficulty.   Patient ismanaging her medicationsat home with use of "pill box" system filled weekly.  Patient reports that facility has been providing transportation to herdoctors'appointments.  Facility staff provides assistance with her needs when called per patient's report.  Anticipated discharge plan is to go back to [facility] (direct to her apartment) with home health services arranged for her there per daughter.  Patient and daughter voiced understanding to callprimary care provider's officewhen shegets back to her apartmentto schedulea post discharge follow-upwithin a week or sooner if needed. Patient letter (with PCP's contact number) was provided as a reminder.  Explained to patient and daughter regarding[facility] CM services available for health management at home but patient denies any current needs or concerns at this point.  However, she verbally agreed and opted for [facility] Calls tofollow-up with recovery after discharge.   Referral made for [facility] calls to follow-up once she gets back to her apartment.  Patient statesunderstandingto seek a referral from primary care provider to [facility] care management if deemed necessaryfor services in the future.  [facility] care management information provided for future needs that may  arise.    For questions, please contact:  Dannielle Huh, BSN, RN- [facility] Primary Care Navigator  Telephone: [phone] [place]

## 2017-05-22 NOTE — Progress Notes (Signed)
  Echocardiogram 2D Echocardiogram has been performed.  Jessica [name] 05/22/2017, 9:04 AM

## 2017-05-22 NOTE — Plan of Care (Signed)
Problem: Safety: Goal: Ability to remain free from injury will improve Outcome: Progressing Pt will be free from falls and injuries during this hospitalization.  Problem: Education: Goal: Expressions of having a comfortable level of knowledge regarding the disease process will increase Outcome: Progressing Pt's knowledge regarding seizures, preventative measure, and precautions will improve prior to discharge.

## 2017-05-22 NOTE — Care Management Note (Signed)
Case Management Note  Patient Details  Name: Jessica [name] MRN: [id number] Date of Birth: 1932-01-19  Subjective/Objective:                    Action/Plan: Spoke with patient and daughter [name] at bedside regarding home health.  Patient is from [place] . Spoke with Child psychotherapist at [facility] [employer] [facility] . [name] spoke directly to [name] and patient via phone. [name] offered patient to go to ALF or SNF at [facility] short term than back to independent living. Patient wants to go to her apartment directly at discharge from hospital . Preferred provider [name] is [place] . Patient in agreement. Per daughter family will provide 24 hour supervision. Referral given to [facility] at [place]   Expected Discharge Date:                  Expected Discharge Plan:  [place]  In-House Referral:     Discharge planning Services  CM Consult  Post Acute Care Choice:    Choice offered to:  Patient, Adult Children  DME Arranged:    DME Agency:     HH Arranged:  RN, PT, OT, Nurse's Aide, Social Work [employer] Agency:     Status of Service:  In process, will continue to follow  If discussed at Long Length of Stay Meetings, dates discussed:    Additional Comments:  [name], RN 05/22/2017, 11:04 AM

## 2017-05-22 NOTE — Discharge Summary (Signed)
Discharge Summary  Jessica [name] IWL:[id number] DOB: 1932/03/04  PCP: Mast, Man X, NP  Admit date: 05/20/2017 Discharge date: 05/22/2017  Time spent: >37mins , more than 50% time spent on coordination of care, case discussed with neurology over the phone, RN, case manager in person, patient and daughter at bedside  Recommendations for Outpatient Follow-up:  1. F/u with PMD within a week  for hospital discharge follow up, repeat cbc/bmp at follow up, pmd to follow up on final urine culture result ( patient denies urinary symptom) 2. F/u with neurology for seizure meds adjustment 3. Home health arranged  Discharge Diagnoses:  Active Hospital Problems   Diagnosis Date Noted  . Encephalopathy acute 05/20/2017  . Seizures ([place])   . Peripheral vascular disease ([place])   . Anxiety   . Hyperlipidemia   . Alcohol abuse   . GERD (gastroesophageal reflux disease) 02/03/2013  . PVD (peripheral vascular disease) ([place]) 10/03/2011  . Hypertension 09/26/2011  . COPD (chronic obstructive pulmonary disease) ([place]) 09/26/2011  . Hyperlipemia 09/26/2011  . Tobacco abuse 09/26/2011    Resolved Hospital Problems   Diagnosis Date Noted Date Resolved  No resolved problems to display.    Discharge Condition: stable  Diet recommendation: heart healthy  Filed Weights   05/20/17 2147  Weight: 45 kg (99 lb 3.2 oz)    History of present illness: (per admitting MD Dr Grandville Silos) PCP: Mast, Man X, NP Patient [facility]  I have personally briefly reviewed patient's old medical records in [place]  Chief Complaint: Confusion  HPI: Jessica [name] is a 83 y.o. female with medical history significant of COPD with ongoing tobacco use, anxiety, seizure disorder, history of colon cancer status post chemotherapy, history of melanoma status post treatment, gastroesophageal reflux disease, hyperlipidemia, hypertension, major depressive disorder,  peripheral vascular disease who [facility] presented to the ED with altered mental status. Per ED report and per daughter [facility] found patient on the floor in her laundry room. Patient unable to give a full history she states she does not know how she ended up on the floor and Y she ended up on the floor. Patient not sure whether this she had a syncopal episode or not. Patient not sure that she had a seizure or not. Patient was last seen normal the night prior to admission at dinner per daughter. Per daughter patient's baseline is alert oriented 4. Note no noted tongue biting on no noted urinary incontinence per ED report. Patient denies any fevers, no chills, no chest pain, no shortness of breath, no nausea, no vomiting, no abdominal pain, no dysuria, no weakness, no numbness, no melena, no hematemesis, no hematochezia, no cough, no closed patient, no diarrhea, no emesis. Per daughter patient has had decreased oral intake and decreased appetite over the past 6 months with a 20 pound weight loss as well as decreased energy. Patient also noted to have some depression. It is noted per daughter that patient's son died approximately a year ago around this time. Patient's daughter states patient does get fatigued with exertion. No other associated symptoms. It was noted the patient CBG on EMS arrival was 131 at the facility. Patient also noted to be repetitive with her answers and alert to self and place. Patient thinks his 54. Patient pains Elyn Peers is the president. When asked what patient's last name is she keeps repeating her first name.  ED Course: Patient seen  in the ED, comprehensive metabolic profile done was unremarkable. CBC done was unremarkable. Urinalysis was nitrite negative leukocytes -0-5 WBCs. Chest x-ray-cardiomegaly, no pulmonary venous congestion, chronic interstitial lung disease, no focal infiltrate. EKG done showed normal sinus rhythm  with LVH. CT head and CT C-spine negative for any acute abnormalities. Tried hospitalists were called to admit the patient for further evaluation and management.   Hospital Course:  Principal Problem:   Encephalopathy acute Active Problems:   COPD (chronic obstructive pulmonary disease) (HCC)   Hypertension   Hyperlipemia   Tobacco abuse   PVD (peripheral vascular disease) (HCC)   GERD (gastroesophageal reflux disease)   Seizures (HCC)   Peripheral vascular disease (HCC)   Anxiety   Hyperlipidemia   Alcohol abuse   Acute encephalopathy, resolved.  -CT head/MRI brain no acute finding (does has old infarcts) -Hiv/b12/rpr/ammonia/tsh unremarkable -EEG "This awake and asleep EEG is abnormal due the presence of generalized 4-5 Hz spike and polyspike and wave discharges with frontal predominance. Clinical Correlation of the above findings is consistent with the interictal expression of a primary generalized epilepsy. There were no electrographic seizures in this study. Clinical correlation is advised." - I have discussed with neurology Dr Rory Percy regarding this EEG finding, per Dr Rory Percy EEG changes reflex post seizure changes. he recommend continue current home seizure meds and out patient follow up with patient's neurology to discuss seizure meds changes. _ patient is to follow up with neurology Dr Krista Blue   H/o Seizure Continue home meds lamicatal and zonegran, seizure precautions  UTI? she denies dysuria, no pain, no fever, no leukocytosis, urine culture pending  She is not treated with abx pmd to follow up on final urine culture result   hypokalemia k Replaced,  mag 1.7  HTN: stable on home meds  COPD: no wheezing, cxr no acute findings  Severe malnutrition in context of chronic illness:  report significant weight loss, has muscle wasting and fat depletion on exam Nutrition consulted, nutrition supplement provided  Tabacco dependence: cessation education  provided  FTT: PT/OT, home health  Code Status: DNR  Family Communication: patient and daughter at bedside  Disposition Plan: home with home health   Consultants:  Phone conversation with neurology Dr Rory Percy prior to discharge  Procedures:  EEG  Antibiotics:  none   Discharge Exam: BP (!) 158/74 (BP Location: Right Arm)   Pulse 70   Temp 98.3 F (36.8 C) (Oral)   Resp 18   Ht 5\' 5"  (1.651 m)   Wt 45 kg (99 lb 3.2 oz)   SpO2 94%   BMI 16.51 kg/m   General: AAOX3, good short term memory, patient and daughter report she now has back to her baseline Cardiovascular: RRR Respiratory: CTABL  Discharge Instructions You were cared for by a hospitalist during your hospital stay. If you have any questions about your discharge medications or the care you received while you were in the hospital after you are discharged, you can call the unit and asked to speak with the hospitalist on call if the hospitalist that took care of you is not available. Once you are discharged, your primary care physician will handle any further medical issues. Please note that NO REFILLS for any discharge medications will be authorized once you are discharged, as it is imperative that you return to your primary care physician (or establish a relationship with a primary care physician if you do not have one) for your aftercare needs so that they can reassess your need for  medications and monitor your lab values.  Discharge Instructions    Diet - low sodium heart healthy    Complete by:  As directed    Increase activity slowly    Complete by:  As directed      Allergies as of 05/22/2017      Reactions   Aspirin Nausea And Vomiting   Effexor [venlafaxine Hcl]    Seizure   Keflex [cephalexin]    Venlafaxine Other (See Comments)   seizures   Penicillins Rash   Zithromax [azithromycin] Rash      Medication List    TAKE these medications   feeding supplement Liqd Take 1 Container by  mouth 3 (three) times daily between meals.   LAMICTAL 150 MG tablet Generic drug:  lamoTRIgine Take 1 tablet (150 mg total) by mouth every morning.   LAMICTAL 200 MG tablet Generic drug:  lamoTRIgine Take 1 tablet (200 mg total) by mouth at bedtime.   losartan 100 MG tablet Commonly known as:  COZAAR Take 1 tablet (100 mg total) by mouth daily.   metoprolol succinate 50 MG 24 hr tablet Commonly known as:  TOPROL-XL TAKE 1 TABLET BY MOUTH DAILY What changed:  See the new instructions.   senna-docusate 8.6-50 MG tablet Commonly known as:  Senokot-S Take 1 tablet by mouth at bedtime.   zonisamide 100 MG capsule Commonly known as:  ZONEGRAN TAKE ONE CAPSULE BY MOUTH EVERY MORNING AND TAKE TWO CAPSULES BY MOUTH EVERY EVENING FOR SEIZURE CONTROL What changed:  See the new instructions.            Discharge Care Instructions        Start     Ordered   05/22/17 0000  Increase activity slowly     05/22/17 1137   05/22/17 0000  Diet - low sodium heart healthy     05/22/17 1137   05/22/17 0000  feeding supplement (BOOST / RESOURCE BREEZE) LIQD  3 times daily between meals     05/22/17 1150   05/22/17 0000  senna-docusate (SENOKOT-S) 8.6-50 MG tablet  Daily at bedtime     05/22/17 1150     Allergies  Allergen Reactions  . Aspirin Nausea And Vomiting  . Effexor [Venlafaxine Hcl]     Seizure  . Keflex [Cephalexin]   . Venlafaxine Other (See Comments)    seizures  . Penicillins Rash  . Zithromax [Azithromycin] Rash   Follow-up Information    Marcial Pacas, MD Follow up in 1 week(s).   Specialty:  Neurology Why:  seizure Contact information: [address] [place] [address] [phone]        Mast, Man X, NP Follow up in 1 week(s).   Specialty:  Internal Medicine Why:  hospital discharge follow up Contact information: [address] [place] [address] [phone]            The results of significant diagnostics from this  hospitalization (including imaging, microbiology, ancillary and laboratory) are listed below for reference.    Significant Diagnostic Studies: Ct Head Wo Contrast  Result Date: 05/20/2017 CLINICAL DATA:  Last seen normal yesterday afternoon, seizure patient, possible fall EXAM: CT HEAD WITHOUT CONTRAST CT CERVICAL SPINE WITHOUT CONTRAST TECHNIQUE: Multidetector CT imaging of the head and cervical spine was performed following the standard protocol without intravenous contrast. Multiplanar CT image reconstructions of the cervical spine were also generated. COMPARISON:  MRI brain dated 10/10/2011.  CT head dated 10/03/2011. FINDINGS: CT HEAD FINDINGS Brain: No  evidence of acute infarction, hemorrhage, hydrocephalus, extra-axial collection or mass lesion/mass effect. Subcortical white matter and periventricular small vessel ischemic changes. Vascular: Intracranial atherosclerosis. Skull: Normal. Negative for fracture or focal lesion. Sinuses/Orbits: Minimal partial opacification of the right maxillary sinus. Mastoid air cells are clear. Other: None. CT CERVICAL SPINE FINDINGS Alignment: Normal cervical lordosis. Skull base and vertebrae: No acute fracture. No primary bone lesion or focal pathologic process. Soft tissues and spinal canal: No prevertebral fluid or swelling. No visible canal hematoma. Disc levels: Mild degenerative changes of the mid/lower cervical spine. Spinal canal is patent. Upper chest: Moderate centrilobular emphysematous changes with biapical pleural-parenchymal scarring. Other: Visualized thyroid is unremarkable IMPRESSION: No evidence of acute intracranial abnormality. Mild small vessel ischemic changes. No evidence of traumatic injury to the cervical spine. Electronically Signed   By: Julian Hy M.D.   On: 05/20/2017 14:07   Ct Cervical Spine Wo Contrast  Result Date: 05/20/2017 CLINICAL DATA:  Last seen normal yesterday afternoon, seizure patient, possible fall EXAM: CT HEAD  WITHOUT CONTRAST CT CERVICAL SPINE WITHOUT CONTRAST TECHNIQUE: Multidetector CT imaging of the head and cervical spine was performed following the standard protocol without intravenous contrast. Multiplanar CT image reconstructions of the cervical spine were also generated. COMPARISON:  MRI brain dated 10/10/2011.  CT head dated 10/03/2011. FINDINGS: CT HEAD FINDINGS Brain: No evidence of acute infarction, hemorrhage, hydrocephalus, extra-axial collection or mass lesion/mass effect. Subcortical white matter and periventricular small vessel ischemic changes. Vascular: Intracranial atherosclerosis. Skull: Normal. Negative for fracture or focal lesion. Sinuses/Orbits: Minimal partial opacification of the right maxillary sinus. Mastoid air cells are clear. Other: None. CT CERVICAL SPINE FINDINGS Alignment: Normal cervical lordosis. Skull base and vertebrae: No acute fracture. No primary bone lesion or focal pathologic process. Soft tissues and spinal canal: No prevertebral fluid or swelling. No visible canal hematoma. Disc levels: Mild degenerative changes of the mid/lower cervical spine. Spinal canal is patent. Upper chest: Moderate centrilobular emphysematous changes with biapical pleural-parenchymal scarring. Other: Visualized thyroid is unremarkable IMPRESSION: No evidence of acute intracranial abnormality. Mild small vessel ischemic changes. No evidence of traumatic injury to the cervical spine. Electronically Signed   By: Julian Hy M.D.   On: 05/20/2017 14:07   Mr Brain Wo Contrast  Result Date: 05/20/2017 CLINICAL DATA:  Found down. History of seizures, hyperlipidemia, hypertension, alcohol abuse, atrial fibrillation. EXAM: MRI HEAD WITHOUT CONTRAST TECHNIQUE: Multiplanar, multiecho pulse sequences of the brain and surrounding structures were obtained without intravenous contrast. COMPARISON:  CT HEAD May 20, 2017 at 1356 hours FINDINGS: Moderately motion degraded examination. BRAIN: No reduced  diffusion to suggest acute ischemia. No susceptibility artifact to suggest hemorrhage. The ventricles and sulci are normal for patient's age. Old small bilateral cerebellar infarcts. Patchy supratentorial white matter FLAIR T2 hyperintensities. Prominent deep gray nuclei perivascular spaces associated with chronic small vessel ischemic disease. Old bilateral thalamus lacunar infarcts. No suspicious parenchymal signal, mass or mass effect. No abnormal extra-axial fluid collections. VASCULAR: Normal major intracranial vascular flow voids present at skull base. SKULL AND UPPER CERVICAL SPINE: No abnormal sellar expansion. No suspicious calvarial bone marrow signal. Craniocervical junction maintained. SINUSES/ORBITS: Scattered mucosal retention cyst. Mastoid air cells are well aerated. The included ocular globes and orbital contents are non-suspicious. Status post bilateral ocular lens implants. OTHER: Patient is edentulous. IMPRESSION: 1. No acute intracranial process on this moderately motion degraded examination. 2. Mild chronic small vessel ischemic disease. 3. Old small cerebellar and thalami infarcts. Electronically Signed   By: Thana Farr.D.  On: 05/20/2017 20:46   Dg Chest Port 1 View  Result Date: 05/21/2017 CLINICAL DATA:  Shortness of breath.  Acute encephalopathy EXAM: PORTABLE CHEST 1 VIEW COMPARISON:  Yesterday FINDINGS: COPD with interstitial coarsening and emphysematous changes. Mild atelectasis at the bases. Calcified granuloma in the peripheral right lung. Normal heart size. Stable aortic tortuosity. IMPRESSION: COPD and minimal atelectasis.  No significant change from yesterday. Electronically Signed   By: Monte Fantasia M.D.   On: 05/21/2017 07:50   Dg Chest Port 1 View  Addendum Date: 05/20/2017   ADDENDUM REPORT: 05/20/2017 14:17 ADDENDUM: The last sentence in the impression should read:No focal infiltrate. Electronically Signed   By: Marcello Moores  Register   On: 05/20/2017 14:17    Result Date: 05/20/2017 CLINICAL DATA:  07/24/2015. EXAM: PORTABLE CHEST 1 VIEW COMPARISON:  07/24/2015. FINDINGS: Mediastinum hilar structures are normal. Cardiomegaly with normal pulmonary vascularity. No focal infiltrate. Punctate calcific density over the right mid lung consistent granuloma again noted. Chronic interstitial changes noted. Biapical pleuroparenchymal thickening consistent scarring No pleural effusion or pneumothorax. Skin fold noted on the left. Surgical screws left humerus and old right proximal humeral fracture again noted. IMPRESSION: 1. Cardiomegaly.  No pulmonary venous congestion. 2. Chronic interstitial lung disease. Biapical pleural-parenchymal thickening consistent scarring. A focal infiltrate. Electronically Signed: By: Marcello Moores  Register On: 05/20/2017 13:34    Microbiology: Recent Results (from the past 240 hour(s))  Culture, Urine     Status: Abnormal (Preliminary result)   Collection Time: 05/21/17  5:04 AM  Result Value Ref Range Status   Specimen Description URINE, CLEAN CATCH  Final   Special Requests NONE  Final   Culture (A)  Final    >=100,000 COLONIES/mL GRAM POSITIVE COCCI IDENTIFICATION TO FOLLOW    Report Status PENDING  Incomplete  MRSA PCR Screening     Status: None   Collection Time: 05/21/17  7:33 PM  Result Value Ref Range Status   MRSA by PCR NEGATIVE NEGATIVE Final    Comment:        The GeneXpert MRSA Assay (FDA approved for NASAL specimens only), is one component of a comprehensive MRSA colonization surveillance program. It is not intended to diagnose MRSA infection nor to guide or monitor treatment for MRSA infections.      Labs: Basic Metabolic Panel:  Recent Labs Lab 05/20/17 1252 05/20/17 2151 05/21/17 0324 05/22/17 0651  NA 140  --  138 142  K 3.8  --  3.0* 4.4  CL 109  --  108 115*  CO2 22  --  22 23  GLUCOSE 105*  --  80 102*  BUN 17  --  16 10  CREATININE 1.13*  --  0.88 0.84  CALCIUM 9.5  --  8.5* 8.7*  MG   --  1.9 1.9 1.7   Liver Function Tests:  Recent Labs Lab 05/20/17 1252 05/20/17 2151 05/21/17 0324  AST 18 19 14*  ALT 12* 12* 8*  ALKPHOS 82 85 68  BILITOT 0.6 0.8 1.0  PROT 6.8 7.2 5.6*  ALBUMIN 3.9 3.9 3.2*   No results for input(s): LIPASE, AMYLASE in the last 168 hours.  Recent Labs Lab 05/20/17 1754  AMMONIA 36*   CBC:  Recent Labs Lab 05/20/17 1252 05/20/17 2151 05/21/17 0324 05/22/17 0651  WBC 8.8  --  6.6 6.2  NEUTROABS 7.0  --   --   --   HGB 13.7  --  12.1 11.5*  HCT 42.1 42.0 37.7 36.7  MCV 89.0  --  88.9 90.6  PLT 234  --  221 207   Cardiac Enzymes:  Recent Labs Lab 05/20/17 1754  CKTOTAL 81   BNP: BNP (last 3 results) No results for input(s): BNP in the last 8760 hours.  ProBNP (last 3 results) No results for input(s): PROBNP in the last 8760 hours.  CBG:  Recent Labs Lab 05/21/17 0808  GLUCAP 129*       SignedFlorencia Reasons MD, PhD  [facility] 05/22/2017, 7:37 PM

## 2017-05-23 LAB — URINE CULTURE

## 2017-05-25 ENCOUNTER — Telehealth: Payer: Self-pay

## 2017-05-25 NOTE — Telephone Encounter (Signed)
Transition Care Management Follow-Up Telephone Call   Date discharged and where:[facility] 05/22/17  How have you been since you were released from the hospital? "feeling already, had a simple seizure and no changes"  Any patient concerns? None  Items Reviewed:   Meds: Yes  Allergies: Yes  Dietary Changes Reviewed:yes  Functional Questionnaire:  Independent-I Dependent-D  ADLs:   Dressing- I    Eating-I   Maintaining continence-I   Transferring-I   Transportation-D   Meal Prep-D   Managing Meds- I  Confirmed importance and Date/Time of follow-up visits scheduled: Mast, NP 05/28/17 @ 1:30pm   Confirmed with patient if condition worsens to call PCP or go to the Emergency Dept. Patient was given office number and encouraged to call back with questions or concerns: Yes

## 2017-05-25 NOTE — Telephone Encounter (Signed)
Possible re-admission to facility. This is a patient you were seeing at [facility] . [facility] F/U is needed if patient was re-admitted to facility upon discharge. Hospital discharge from [facility] on 05/22/17.

## 2017-05-26 LAB — URINE CULTURE

## 2017-05-28 ENCOUNTER — Ambulatory Visit: Payer: Medicare Other | Admitting: Nurse Practitioner

## 2017-05-28 ENCOUNTER — Telehealth: Payer: Self-pay

## 2017-05-28 ENCOUNTER — Encounter: Payer: Self-pay | Admitting: Nurse Practitioner

## 2017-05-28 DIAGNOSIS — G9340 Encephalopathy, unspecified: Secondary | ICD-10-CM

## 2017-05-28 DIAGNOSIS — D649 Anemia, unspecified: Secondary | ICD-10-CM | POA: Diagnosis not present

## 2017-05-28 DIAGNOSIS — G40309 Generalized idiopathic epilepsy and epileptic syndromes, not intractable, without status epilepticus: Secondary | ICD-10-CM

## 2017-05-28 DIAGNOSIS — I10 Essential (primary) hypertension: Secondary | ICD-10-CM | POA: Diagnosis not present

## 2017-05-28 DIAGNOSIS — J449 Chronic obstructive pulmonary disease, unspecified: Secondary | ICD-10-CM | POA: Diagnosis not present

## 2017-05-28 NOTE — Progress Notes (Signed)
Location:   [facility]   Place of Service:   clinic   Provider: Marlana Latus NP  Code Status: DNR Goals of Care:  Advanced Directives 05/28/2017  Does Patient Have a Medical Advance Directive? Yes  Type of Paramedic of [place];Living will;Out of facility DNR (pink MOST or yellow form)  Does patient want to make changes to medical advance directive? No - Patient declined  Copy of [place] in Chart? Yes  Pre-existing out of facility DNR order (yellow form or pink MOST form) Yellow form placed in chart (order not valid for inpatient use)     Chief Complaint  Patient presents with  . Hospitalization Follow-up    HPI: Patient is a 83 y.o. female seen today for hospital follow-up. She was hospitalized from 05/20/17 to 05/22/17, confusion(AMS), she was found on the floor in laundry room prior to ED,  she takes  . She was replaced, last K 4.4 05/22/17. On 05/22/17 wbc 6.2, Hgb 11.5, plt 207.   Hx of COPD with ongoing tobacco use, gradual weight loss, but refuses nutritional supplement. HTN, blood pressure is controlled, taking Losartan 100mg  qd and Metoprolol 50mg  qd  Past Medical History:  Diagnosis Date  . Alcohol abuse 11/11/2010  . Alcohol abuse, unspecified   . Anxiety   . Atrial fibrillation ([place]) 11/11/2010  . Bone infection of left hand ([place]) 12/22/2008   secondary to injury  . Cancer ([place])    colon cancer-hx of chemo  . Chronic airway obstruction, not elsewhere classified 10/01/2011  . Confusion   . COPD (chronic obstructive pulmonary disease) ([place])   . Dizziness and giddiness 04/24/2005  . Edema 12/03/2011  . Embolism - blood clot 1996   left breast  . Generalized convulsive epilepsy without mention of intractable epilepsy   . GERD (gastroesophageal reflux disease)   . Hemorrhoids   . Hyperlipidemia   . Hypertension   . Insomnia, unspecified 11/11/2010  . Major depressive disorder, single episode, unspecified 11/11/2010  . Malignant  neoplasm of stomach, unspecified site 11/11/2010  . Melanoma ([place])    metastatic  . Peripheral vascular disease ([place])   . Seizures ([place])   . Symptomatic menopausal or female climacteric states 11/11/2010  . Tachycardia, unspecified   . Tobacco use disorder   . Urgency of urination     Past Surgical History:  Procedure Laterality Date  . ABDOMINAL HYSTERECTOMY  1963  . BLADDER REPAIR    . breast biopsies     x2 on left, x 1 on right  . c-sections  1957, 1959, 1961   x3  . COLON RESECTION  1995  . COLONOSCOPY  10/21/2012   Dr. Paulita Fujita diverticulosis, sessile polyps biopsy   . heart ablation  1997  . HEMORRHOID SURGERY    . laser surgery to right eye  10/04/2007  . left femoral artery stent  05/07/2004  . melanoma removed from left groin  09/2004  . melanoma removed from left leg  09/06/2004  . orif left humerus fracture  03/27/2005  . porta cath placement  1995  . porta cath removal  1996  . removal of tumor in left knee  11/15/2010  . skin graft to left leg  12/05/2004  . VOCAL CORD INJECTION  1996, 1997    Allergies  Allergen Reactions  . Aspirin Nausea And Vomiting  . Effexor [Venlafaxine Hcl]     Seizure  . Keflex [Cephalexin]   . Venlafaxine Other (See Comments)  seizures  . Penicillins Rash  . Zithromax [Azithromycin] Rash    Allergies as of 05/28/2017      Reactions   Aspirin Nausea And Vomiting   Effexor [venlafaxine Hcl]    Seizure   Keflex [cephalexin]    Venlafaxine Other (See Comments)   seizures   Penicillins Rash   Zithromax [azithromycin] Rash      Medication List       Accurate as of 05/28/17 11:59 PM. Always use your most recent med list.          LAMICTAL 150 MG tablet Generic drug:  lamoTRIgine Take 1 tablet (150 mg total) by mouth every morning.   LAMICTAL 200 MG tablet Generic drug:  lamoTRIgine Take 1 tablet (200 mg total) by mouth at bedtime.   losartan 100 MG tablet Commonly known as:  COZAAR Take 1 tablet (100 mg total) by mouth  daily.   metoprolol succinate 50 MG 24 hr tablet Commonly known as:  TOPROL-XL TAKE 1 TABLET BY MOUTH DAILY   zonisamide 100 MG capsule Commonly known as:  ZONEGRAN TAKE ONE CAPSULE BY MOUTH EVERY MORNING AND TAKE TWO CAPSULES BY MOUTH EVERY EVENING FOR SEIZURE CONTROL            Discharge Care Instructions        Start     Ordered   05/28/17 2376  Basic Metabolic Panel     28/31/51 1501      Review of Systems:  Review of Systems  Constitutional: Positive for fatigue. Negative for activity change, appetite change, chills, diaphoresis and fever.  HENT: Positive for hearing loss. Negative for congestion and sore throat.   Eyes: Negative for visual disturbance.  Respiratory: Positive for cough. Negative for choking, chest tightness, shortness of breath and wheezing.        Chronic   Cardiovascular: Negative for chest pain, palpitations and leg swelling.  Gastrointestinal: Negative for abdominal distention, abdominal pain, constipation, diarrhea, nausea and vomiting.  Genitourinary: Negative for difficulty urinating, dysuria, frequency, hematuria and urgency.  Musculoskeletal: Negative for arthralgias, back pain and gait problem.  Skin: Positive for wound. Negative for rash.       Small scab on the right elbow  Neurological: Positive for seizures. Negative for dizziness, speech difficulty, numbness and headaches.       She controls her dizziness with changing position and head slowly.   Psychiatric/Behavioral: Negative for agitation, behavioral problems, confusion, hallucinations and sleep disturbance.       Some memory recall issues.     Health Maintenance  Topic Date Due  . TETANUS/TDAP  05/28/1951  . PNA vac Low Risk Adult (1 of 2 - PCV13) 05/29/1999  . INFLUENZA VACCINE  04/29/2017  . DEXA SCAN  Completed    Physical Exam: Vitals:   05/28/17 1320  BP: 120/68  Pulse: 73  Resp: 20  Temp: 98.1 F (36.7 C)  TempSrc: Oral  SpO2: 91%  Weight: 98 lb 9.6 oz (44.7  kg)  Height: 5\' 5"  (1.651 m)   Body mass index is 16.41 kg/m. Physical Exam  Constitutional: She is oriented to person, place, and time. She appears well-developed and well-nourished.  thin  HENT:  Head: Normocephalic and atraumatic.  Mouth/Throat: Oropharynx is clear and moist. No oropharyngeal exudate.  Eyes: Pupils are equal, round, and reactive to light. EOM are normal. Right eye exhibits no discharge. Left eye exhibits no discharge. No scleral icterus.  Neck: Normal range of motion. Neck supple. No JVD present. No thyromegaly  present.  Cardiovascular: Normal rate, regular rhythm and normal heart sounds.   Pulmonary/Chest: Effort normal. She has no wheezes. She has rales.  Posterior left lung base with deep breathing.   Abdominal: Soft. Bowel sounds are normal. She exhibits no distension. There is no tenderness. There is no rebound and no guarding.  Musculoskeletal: Normal range of motion. She exhibits no edema or tenderness.  Lymphadenopathy:    She has no cervical adenopathy.  Neurological: She is alert and oriented to person, place, and time. She exhibits normal muscle tone. Coordination normal.  Skin: Skin is warm and dry.  Psychiatric: She has a normal mood and affect. Her behavior is normal. Judgment and thought content normal.    Labs reviewed: Basic Metabolic Panel:  Recent Labs  09/30/16 0650 05/20/17 1252 05/20/17 2151 05/21/17 0324 05/22/17 0651  NA 142 140  --  138 142  K 4.1 3.8  --  3.0* 4.4  CL 109 109  --  108 115*  CO2 25 22  --  22 23  GLUCOSE 112* 105*  --  80 102*  BUN 16 17  --  16 10  CREATININE 1.16* 1.13*  --  0.88 0.84  CALCIUM 9.5 9.5  --  8.5* 8.7*  MG  --   --  1.9 1.9 1.7  TSH 2.17  --  1.553  --   --    Liver Function Tests:  Recent Labs  05/20/17 1252 05/20/17 2151 05/21/17 0324  AST 18 19 14*  ALT 12* 12* 8*  ALKPHOS 82 85 68  BILITOT 0.6 0.8 1.0  PROT 6.8 7.2 5.6*  ALBUMIN 3.9 3.9 3.2*   No results for input(s): LIPASE,  AMYLASE in the last 8760 hours.  Recent Labs  05/20/17 1754  AMMONIA 36*   CBC:  Recent Labs  09/30/16 0650 05/20/17 1252 05/20/17 2151 05/21/17 0324 05/22/17 0651  WBC 9.2 8.8  --  6.6 6.2  NEUTROABS 5,060 7.0  --   --   --   HGB 14.3 13.7  --  12.1 11.5*  HCT 44.7 42.1 42.0 37.7 36.7  MCV 91.4 89.0  --  88.9 90.6  PLT 301 234  --  221 207   Lipid Panel:  Recent Labs  05/21/17 0324  CHOL 148  HDL 53  LDLCALC 82  TRIG 66  CHOLHDL 2.8   No results found for: HGBA1C  Procedures since last visit: Ct Head Wo Contrast  Result Date: 05/20/2017 CLINICAL DATA:  Last seen normal yesterday afternoon, seizure patient, possible fall EXAM: CT HEAD WITHOUT CONTRAST CT CERVICAL SPINE WITHOUT CONTRAST TECHNIQUE: Multidetector CT imaging of the head and cervical spine was performed following the standard protocol without intravenous contrast. Multiplanar CT image reconstructions of the cervical spine were also generated. COMPARISON:  MRI brain dated 10/10/2011.  CT head dated 10/03/2011. FINDINGS: CT HEAD FINDINGS Brain: No evidence of acute infarction, hemorrhage, hydrocephalus, extra-axial collection or mass lesion/mass effect. Subcortical white matter and periventricular small vessel ischemic changes. Vascular: Intracranial atherosclerosis. Skull: Normal. Negative for fracture or focal lesion. Sinuses/Orbits: Minimal partial opacification of the right maxillary sinus. Mastoid air cells are clear. Other: None. CT CERVICAL SPINE FINDINGS Alignment: Normal cervical lordosis. Skull base and vertebrae: No acute fracture. No primary bone lesion or focal pathologic process. Soft tissues and spinal canal: No prevertebral fluid or swelling. No visible canal hematoma. Disc levels: Mild degenerative changes of the mid/lower cervical spine. Spinal canal is patent. Upper chest: Moderate centrilobular emphysematous changes with biapical  pleural-parenchymal scarring. Other: Visualized thyroid is  unremarkable IMPRESSION: No evidence of acute intracranial abnormality. Mild small vessel ischemic changes. No evidence of traumatic injury to the cervical spine. Electronically Signed   By: Julian Hy M.D.   On: 05/20/2017 14:07   Ct Cervical Spine Wo Contrast  Result Date: 05/20/2017 CLINICAL DATA:  Last seen normal yesterday afternoon, seizure patient, possible fall EXAM: CT HEAD WITHOUT CONTRAST CT CERVICAL SPINE WITHOUT CONTRAST TECHNIQUE: Multidetector CT imaging of the head and cervical spine was performed following the standard protocol without intravenous contrast. Multiplanar CT image reconstructions of the cervical spine were also generated. COMPARISON:  MRI brain dated 10/10/2011.  CT head dated 10/03/2011. FINDINGS: CT HEAD FINDINGS Brain: No evidence of acute infarction, hemorrhage, hydrocephalus, extra-axial collection or mass lesion/mass effect. Subcortical white matter and periventricular small vessel ischemic changes. Vascular: Intracranial atherosclerosis. Skull: Normal. Negative for fracture or focal lesion. Sinuses/Orbits: Minimal partial opacification of the right maxillary sinus. Mastoid air cells are clear. Other: None. CT CERVICAL SPINE FINDINGS Alignment: Normal cervical lordosis. Skull base and vertebrae: No acute fracture. No primary bone lesion or focal pathologic process. Soft tissues and spinal canal: No prevertebral fluid or swelling. No visible canal hematoma. Disc levels: Mild degenerative changes of the mid/lower cervical spine. Spinal canal is patent. Upper chest: Moderate centrilobular emphysematous changes with biapical pleural-parenchymal scarring. Other: Visualized thyroid is unremarkable IMPRESSION: No evidence of acute intracranial abnormality. Mild small vessel ischemic changes. No evidence of traumatic injury to the cervical spine. Electronically Signed   By: Julian Hy M.D.   On: 05/20/2017 14:07   Mr Brain Wo Contrast  Result Date:  05/20/2017 CLINICAL DATA:  Found down. History of seizures, hyperlipidemia, hypertension, alcohol abuse, atrial fibrillation. EXAM: MRI HEAD WITHOUT CONTRAST TECHNIQUE: Multiplanar, multiecho pulse sequences of the brain and surrounding structures were obtained without intravenous contrast. COMPARISON:  CT HEAD May 20, 2017 at 1356 hours FINDINGS: Moderately motion degraded examination. BRAIN: No reduced diffusion to suggest acute ischemia. No susceptibility artifact to suggest hemorrhage. The ventricles and sulci are normal for patient's age. Old small bilateral cerebellar infarcts. Patchy supratentorial white matter FLAIR T2 hyperintensities. Prominent deep gray nuclei perivascular spaces associated with chronic small vessel ischemic disease. Old bilateral thalamus lacunar infarcts. No suspicious parenchymal signal, mass or mass effect. No abnormal extra-axial fluid collections. VASCULAR: Normal major intracranial vascular flow voids present at skull base. SKULL AND UPPER CERVICAL SPINE: No abnormal sellar expansion. No suspicious calvarial bone marrow signal. Craniocervical junction maintained. SINUSES/ORBITS: Scattered mucosal retention cyst. Mastoid air cells are well aerated. The included ocular globes and orbital contents are non-suspicious. Status post bilateral ocular lens implants. OTHER: Patient is edentulous. IMPRESSION: 1. No acute intracranial process on this moderately motion degraded examination. 2. Mild chronic small vessel ischemic disease. 3. Old small cerebellar and thalami infarcts. Electronically Signed   By: Elon Alas M.D.   On: 05/20/2017 20:46   Dg Chest Port 1 View  Result Date: 05/21/2017 CLINICAL DATA:  Shortness of breath.  Acute encephalopathy EXAM: PORTABLE CHEST 1 VIEW COMPARISON:  Yesterday FINDINGS: COPD with interstitial coarsening and emphysematous changes. Mild atelectasis at the bases. Calcified granuloma in the peripheral right lung. Normal heart size. Stable  aortic tortuosity. IMPRESSION: COPD and minimal atelectasis.  No significant change from yesterday. Electronically Signed   By: Monte Fantasia M.D.   On: 05/21/2017 07:50   Dg Chest Port 1 View  Addendum Date: 05/20/2017   ADDENDUM REPORT: 05/20/2017 14:17 ADDENDUM: The last sentence in  the impression should read:No focal infiltrate. Electronically Signed   By: Marcello Moores  Register   On: 05/20/2017 14:17   Result Date: 05/20/2017 CLINICAL DATA:  07/24/2015. EXAM: PORTABLE CHEST 1 VIEW COMPARISON:  07/24/2015. FINDINGS: Mediastinum hilar structures are normal. Cardiomegaly with normal pulmonary vascularity. No focal infiltrate. Punctate calcific density over the right mid lung consistent granuloma again noted. Chronic interstitial changes noted. Biapical pleuroparenchymal thickening consistent scarring No pleural effusion or pneumothorax. Skin fold noted on the left. Surgical screws left humerus and old right proximal humeral fracture again noted. IMPRESSION: 1. Cardiomegaly.  No pulmonary venous congestion. 2. Chronic interstitial lung disease. Biapical pleural-parenchymal thickening consistent scarring. A focal infiltrate. Electronically Signed: By: Marcello Moores  Register On: 05/20/2017 13:34    Assessment/Plan Encephalopathy Normalized mentation at her baseline, f/u CBC BMP  Generalized convulsive epilepsy ([place]) Continue current treatment, Zonisamide 100mg  am 200mg  pm, Lamictal 150mg  am and 200mg  qhs.  f/u CBC BMP, f/u Neurology 07/14/17 for acute encephalopathy and Seizure. Unremarkable head CT, EKG, CXR, CBC, CMP, UA except abnormal EEG, which was underwent neurologist evaluation and will f/u as out patient  Hypertension Controlled, continue Losartan and Metoprolol  COPD (chronic obstructive pulmonary disease) ([facility]) Chronic cough, ongoing tobacco use at gradual reduced amount, will continue to encourage smoke secession.   Anemia Last wbc 6.2, Hgb 11.5, 05/22/17, wbc 6.6, Hgb 12.1 05/21/17, stable.  Will delay f/u CBC. Dr. Bubba Camp was consulted.     Labs/tests ordered: BMP  Next appt:  Dr Bubba Camp 4 weeks.   Time spend 25 minutes

## 2017-05-28 NOTE — Assessment & Plan Note (Signed)
Last wbc 6.2, Hgb 11.5, 05/22/17, wbc 6.6, Hgb 12.1 05/21/17, stable. Will delay f/u CBC. Dr. Bubba Camp was consulted.

## 2017-05-28 NOTE — Assessment & Plan Note (Signed)
Controlled, continue Losartan and Metoprolol

## 2017-05-28 NOTE — Assessment & Plan Note (Addendum)
Chronic cough, ongoing tobacco use at gradual reduced amount, will continue to encourage smoke secession.

## 2017-05-28 NOTE — Telephone Encounter (Signed)
Becky with [facility] called to say that patient is refusing all home health services and Jacqlyn Larsen wanted to make sure that provider knew.   Jacqlyn Larsen would like a call from [facility] to discuss the patient's options. She stated that provider could ask for [place] or Olivia Mackie at [phone].

## 2017-05-28 NOTE — Patient Instructions (Addendum)
F/u BMP 06/02/17, Dr. Bubba Camp 4 weeks.

## 2017-05-28 NOTE — Assessment & Plan Note (Signed)
Continue current treatment, Zonisamide 100mg  am 200mg  pm, Lamictal 150mg  am and 200mg  qhs.  f/u CBC BMP, f/u Neurology 07/14/17 for acute encephalopathy and Seizure. Unremarkable head CT, EKG, CXR, CBC, CMP, UA except abnormal EEG, which was underwent neurologist evaluation and will f/u as out patient

## 2017-05-28 NOTE — Assessment & Plan Note (Signed)
Normalized mentation at her baseline, f/u CBC BMP

## 2017-05-29 NOTE — Telephone Encounter (Signed)
Spoke with Olivia Mackie at [facility] , informed her that Ms Jessica [name] is an independent resident and has the right to refused any services that placed for her. She stated that she understood and just wanted Manxie to be aware.

## 2017-06-08 ENCOUNTER — Other Ambulatory Visit: Payer: Self-pay | Admitting: Nurse Practitioner

## 2017-06-08 DIAGNOSIS — I10 Essential (primary) hypertension: Secondary | ICD-10-CM

## 2017-06-16 DIAGNOSIS — Z961 Presence of intraocular lens: Secondary | ICD-10-CM | POA: Diagnosis not present

## 2017-06-16 DIAGNOSIS — H524 Presbyopia: Secondary | ICD-10-CM | POA: Diagnosis not present

## 2017-06-30 ENCOUNTER — Ambulatory Visit: Payer: Medicare Other | Admitting: Internal Medicine

## 2017-06-30 ENCOUNTER — Encounter: Payer: Self-pay | Admitting: Internal Medicine

## 2017-06-30 VITALS — BP 128/62 | HR 75 | Temp 97.8°F | Resp 18 | Ht 65.0 in | Wt 103.0 lb

## 2017-06-30 DIAGNOSIS — I10 Essential (primary) hypertension: Secondary | ICD-10-CM | POA: Diagnosis not present

## 2017-06-30 DIAGNOSIS — G40309 Generalized idiopathic epilepsy and epileptic syndromes, not intractable, without status epilepticus: Secondary | ICD-10-CM | POA: Diagnosis not present

## 2017-06-30 DIAGNOSIS — I739 Peripheral vascular disease, unspecified: Secondary | ICD-10-CM | POA: Diagnosis not present

## 2017-06-30 DIAGNOSIS — J449 Chronic obstructive pulmonary disease, unspecified: Secondary | ICD-10-CM

## 2017-06-30 DIAGNOSIS — K219 Gastro-esophageal reflux disease without esophagitis: Secondary | ICD-10-CM

## 2017-06-30 DIAGNOSIS — F418 Other specified anxiety disorders: Secondary | ICD-10-CM

## 2017-06-30 NOTE — Progress Notes (Signed)
[facility]  Provider: Blanchie Serve MD   Location:      Place of Service:     PCP: Mast, Man X, NP Patient Care Team: Mast, Man X, NP as PCP - General (Internal Medicine)  Extended Emergency Contact Information Primary Emergency Contact: Powers,Kristine Address: [address] [place], [place] Phone: [phone] Relation: Daughter  Goals of Care: Advanced Directive information Advanced Directives 05/28/2017  Does Patient Have a Medical Advance Directive? Yes  Type of Paramedic of [place];Living will;Out of facility DNR (pink MOST or yellow form)  Does patient want to make changes to medical advance directive? No - Patient declined  Copy of [place] in Chart? Yes  Pre-existing out of facility DNR order (yellow form or pink MOST form) Yellow form placed in chart (order not valid for inpatient use)     Chief Complaint  Patient presents with  . Medical Management of Chronic Issues  . Medication Refill    No refills needed at this time    HPI: Patient is a 83 y.o. female seen today for routine visit.   Hypertension- 2 weeks ago she had elevated BP reading. BP at home in 200/100 range. Mentions being under stress at that point. No reading available for review. She would like medication to be taken in between for high BP. Complaints of occasional dizziness.  Today BP reading stable. Denies headache, chest pain or dyspnea.   Mood disorder- feels low and has anxiety. Feels this is situational. Does not want to try counselling or medication  COPD- continues to smoke. Has wheezing and chronic cough. Not on any bronchodilator treatment  Tobacco use- continues to smoke.   GERD- careful about her diet. Understands that smoking and stress can exacerbate this. Not on meds  Seizure- seizure free for now. Taking her seizure meds.     Past Medical History:    Diagnosis Date  . Alcohol abuse 11/11/2010  . Alcohol abuse, unspecified   . Anxiety   . Atrial fibrillation ([place]) 11/11/2010  . Bone infection of left hand ([place]) 12/22/2008   secondary to injury  . Cancer ([place])    colon cancer-hx of chemo  . Chronic airway obstruction, not elsewhere classified 10/01/2011  . Confusion   . COPD (chronic obstructive pulmonary disease) ([place])   . Dizziness and giddiness 04/24/2005  . Edema 12/03/2011  . Embolism - blood clot 1996   left breast  . Generalized convulsive epilepsy without mention of intractable epilepsy   . GERD (gastroesophageal reflux disease)   . Hemorrhoids   . Hyperlipidemia   . Hypertension   . Insomnia, unspecified 11/11/2010  . Major depressive disorder, single episode, unspecified 11/11/2010  . Malignant neoplasm of stomach, unspecified site 11/11/2010  . Melanoma ([place])    metastatic  . Peripheral vascular disease ([place])   . Seizures ([place])   . Symptomatic menopausal or female climacteric states 11/11/2010  . Tachycardia, unspecified   . Tobacco use disorder   . Urgency of urination    Past Surgical History:  Procedure Laterality Date  . ABDOMINAL HYSTERECTOMY  1963  . BLADDER REPAIR    . breast biopsies     x2 on left, x 1 on right  . c-sections  1957, 1959, 1961   x3  . COLON RESECTION  1995  . COLONOSCOPY  10/21/2012   Dr. Paulita Fujita diverticulosis, sessile polyps biopsy   .  heart ablation  1997  . HEMORRHOID SURGERY    . laser surgery to right eye  10/04/2007  . left femoral artery stent  05/07/2004  . melanoma removed from left groin  09/2004  . melanoma removed from left leg  09/06/2004  . orif left humerus fracture  03/27/2005  . porta cath placement  1995  . porta cath removal  1996  . removal of tumor in left knee  11/15/2010  . skin graft to left leg  12/05/2004  . [place]    reports that she has been smoking Cigarettes.  She has a 30.00 pack-year smoking history. She has never used smokeless  tobacco. She reports that she does not drink alcohol or use drugs. Social History   Social History  . Marital status: Married    Spouse name: Gwyndolyn Saxon  . Number of children: 3  . Years of education: college   Occupational History  . Housewife Retired    retired   Social History Main Topics  . Smoking status: Current Every Day Smoker    Packs/day: 0.50    Years: 60.00    Types: Cigarettes  . Smokeless tobacco: Never Used  . Alcohol use No     Comment: quit 02-02-2010  . Drug use: No  . Sexual activity: No   Other Topics Concern  . Not on file   Social History Narrative   Patient is retired. Patient lives at [facility] alone.    Education- college education.   Right handed.   Caffeine- some. Two cups daily.   Widowed    Exercise no   Alcohol none   Smokes about half pack daily     Functional Status Survey:    History reviewed. No pertinent family history.  Health Maintenance  Topic Date Due  . TETANUS/TDAP  05/28/1953  . PNA vac Low Risk Adult (1 of 2 - PCV13) 05/29/1999  . INFLUENZA VACCINE  04/29/2017  . DEXA SCAN  Completed    Allergies  Allergen Reactions  . Aspirin Nausea And Vomiting  . Effexor [Venlafaxine Hcl]     Seizure  . Keflex [Cephalexin]   . Venlafaxine Other (See Comments)    seizures  . Penicillins Rash  . Zithromax [Azithromycin] Rash    Outpatient Encounter Prescriptions as of 06/30/2017  Medication Sig  . LAMICTAL 150 MG tablet Take 1 tablet (150 mg total) by mouth every morning.  Marland Kitchen LAMICTAL 200 MG tablet Take 1 tablet (200 mg total) by mouth at bedtime.  Marland Kitchen losartan (COZAAR) 100 MG tablet TAKE ONE TABLET BY MOUTH DAILY  . metoprolol succinate (TOPROL-XL) 50 MG 24 hr tablet TAKE 1 TABLET BY MOUTH DAILY  . zonisamide (ZONEGRAN) 100 MG capsule TAKE ONE CAPSULE BY MOUTH EVERY MORNING AND TAKE TWO CAPSULES BY MOUTH EVERY EVENING FOR SEIZURE CONTROL   No facility-administered encounter medications on file as of 06/30/2017.      Review of Systems  Constitutional: Positive for appetite change and unexpected weight change. Negative for chills, diaphoresis and fever.  HENT: Positive for rhinorrhea. Negative for congestion, ear discharge, ear pain, mouth sores, nosebleeds, sore throat and trouble swallowing.        Chronic rhinorrhea  Eyes: Negative for pain, redness and visual disturbance.       Follows with ophthalmologist.history of cataract surgery.   Respiratory: Positive for cough and wheezing. Negative for choking and shortness of breath.        With clear mucus, chronic.  Has been a smoker in the past.  Cardiovascular: Negative for chest pain, palpitations and leg swelling.  Gastrointestinal: Negative for abdominal pain, blood in stool, constipation, diarrhea, nausea and vomiting.  Genitourinary: Negative for dysuria, hematuria and pelvic pain.  Musculoskeletal: Negative for arthralgias, back pain and gait problem.  Skin: Negative for rash and wound.  Neurological: Positive for dizziness, seizures and numbness. Negative for tremors, syncope and headaches.       Has history of seizures  Hematological: Bruises/bleeds easily.  Psychiatric/Behavioral: Positive for dysphoric mood. Negative for hallucinations, sleep disturbance and suicidal ideas. The patient is not nervous/anxious.     Vitals:   06/30/17 0849  BP: 128/62  Pulse: 75  Resp: 18  Temp: 97.8 F (36.6 C)  TempSrc: Oral  SpO2: 93%  Weight: 103 lb (46.7 kg)  Height: 5\' 5"  (1.651 m)   Body mass index is 17.14 kg/m.   Wt Readings from Last 3 Encounters:  06/30/17 103 lb (46.7 kg)  05/28/17 98 lb 9.6 oz (44.7 kg)  05/20/17 99 lb 3.2 oz (45 kg)   Physical Exam  Constitutional: She is oriented to person, place, and time. She appears well-developed. No distress.  Thin built elderly female, muscle wasting present  HENT:  Head: Normocephalic and atraumatic.  Nose: Nose normal.  Mouth/Throat: Oropharynx is clear and moist. No oropharyngeal  exudate.  Eyes: Pupils are equal, round, and reactive to light. Conjunctivae are normal. Right eye exhibits no discharge. Left eye exhibits no discharge.  Neck: Normal range of motion. Neck supple. No thyromegaly present.  Cardiovascular: Normal rate and regular rhythm.   Pulmonary/Chest: Effort normal. No respiratory distress. She has wheezes. She has no rales.  Decreased air movement to lung bases  Abdominal: Soft. Bowel sounds are normal. She exhibits no distension. There is no tenderness. There is no guarding.  Musculoskeletal: She exhibits no edema.  Lymphadenopathy:    She has no cervical adenopathy.  Neurological: She is alert and oriented to person, place, and time.  Skin: Skin is warm and dry. She is not diaphoretic.  Chronic skin changes to lower legs.   Psychiatric:  Flat affect, somewhat anxious     Depression screen [facility] 2/9 06/30/2017  Decreased Interest 3  Down, Depressed, Hopeless 3  PHQ - 2 Score 6  Altered sleeping 3  Tired, decreased energy 3  Change in appetite 3  Feeling bad or failure about yourself  3  Trouble concentrating 3  Moving slowly or fidgety/restless 3  Suicidal thoughts 3  PHQ-9 Score 27  Difficult doing work/chores Somewhat difficult    Labs reviewed: Basic Metabolic Panel:  Recent Labs  05/20/17 1252 05/20/17 2151 05/21/17 0324 05/22/17 0651  NA 140  --  138 142  K 3.8  --  3.0* 4.4  CL 109  --  108 115*  CO2 22  --  22 23  GLUCOSE 105*  --  80 102*  BUN 17  --  16 10  CREATININE 1.13*  --  0.88 0.84  CALCIUM 9.5  --  8.5* 8.7*  MG  --  1.9 1.9 1.7   Liver Function Tests:  Recent Labs  05/20/17 1252 05/20/17 2151 05/21/17 0324  AST 18 19 14*  ALT 12* 12* 8*  ALKPHOS 82 85 68  BILITOT 0.6 0.8 1.0  PROT 6.8 7.2 5.6*  ALBUMIN 3.9 3.9 3.2*   No results for input(s): LIPASE, AMYLASE in the last 8760 hours.  Recent Labs  05/20/17 1754  AMMONIA 36*  CBC:  Recent Labs  09/30/16 0650 05/20/17 1252 05/20/17 2151  05/21/17 0324 05/22/17 0651  WBC 9.2 8.8  --  6.6 6.2  NEUTROABS 5,060 7.0  --   --   --   HGB 14.3 13.7  --  12.1 11.5*  HCT 44.7 42.1 42.0 37.7 36.7  MCV 91.4 89.0  --  88.9 90.6  PLT 301 234  --  221 207   Cardiac Enzymes:  Recent Labs  05/20/17 1754  CKTOTAL 81   BNP: Invalid input(s): POCBNP No results found for: HGBA1C Lab Results  Component Value Date   TSH 1.553 05/20/2017   Lab Results  Component Value Date   VITAMINB12 238 05/20/2017   Lab Results  Component Value Date   FOLATE 14.3 10/03/2011   No results found for: IRON, TIBC, FERRITIN  Lipid Panel:  Recent Labs  05/21/17 0324  CHOL 148  HDL 53  LDLCALC 82  TRIG 66  CHOLHDL 2.8   No results found for: HGBA1C  Procedures since last visit: No results found.  Assessment/Plan  1. Essential hypertension Stable reading this visit. Continue losartan 100 mg daily with metoprolol succinate 50 mg daily and monitor. Advised to check BP once a day at home and provide 2 week reading for office to review.  - CMP; Future - Lipid Panel; Future - CBC with Differential/Platelets; Future  2. PVD (peripheral vascular disease) ([place]) Continue skin care.  - Lipid Panel; Future - CBC with Differential/Platelets; Future  3. Chronic obstructive pulmonary disease, unspecified COPD type ([place]) counselled on smoking cessation. Pt does not want to. Has wheezing on exam. Refuses to initiate bronchodilators for now.   4. Gastroesophageal reflux disease without esophagitis Controlled symptom, not on meds, monitor - CMP; Future  5. Generalized convulsive epilepsy ([place]) Seizure free. Continue lamictal and zonisamide  6. Depression with anxiety Refuses counselling services and meds for now. Pt understands that if her symptoms persists or worsens, she will reach out for assistance.     Labs/tests ordered:  As above  Next appointment: 3 months with [facility]  Communication: reviewed care plan with patient      Blanchie Serve, MD Internal Medicine [place], [place] [address] Cell Phone (Monday-Friday 8 am - 5 pm): [phone] On Call: [phone] and follow prompts after 5 pm and on weekends Office Phone: [phone] Office Fax: [phone]

## 2017-07-14 ENCOUNTER — Ambulatory Visit (INDEPENDENT_AMBULATORY_CARE_PROVIDER_SITE_OTHER): Payer: Medicare Other | Admitting: Neurology

## 2017-07-14 ENCOUNTER — Encounter: Payer: Self-pay | Admitting: Neurology

## 2017-07-14 ENCOUNTER — Telehealth: Payer: Self-pay | Admitting: *Deleted

## 2017-07-14 VITALS — BP 143/75 | HR 72 | Ht 65.0 in | Wt 104.8 lb

## 2017-07-14 DIAGNOSIS — R410 Disorientation, unspecified: Secondary | ICD-10-CM

## 2017-07-14 DIAGNOSIS — G40909 Epilepsy, unspecified, not intractable, without status epilepticus: Secondary | ICD-10-CM | POA: Diagnosis not present

## 2017-07-14 DIAGNOSIS — J449 Chronic obstructive pulmonary disease, unspecified: Secondary | ICD-10-CM | POA: Diagnosis not present

## 2017-07-14 MED ORDER — ZONISAMIDE 100 MG PO CAPS
ORAL_CAPSULE | ORAL | 4 refills | Status: DC
Start: 2017-07-14 — End: 2018-01-12

## 2017-07-14 NOTE — Telephone Encounter (Addendum)
Pt arrive on time for appointment.
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
## 2017-08-05 ENCOUNTER — Other Ambulatory Visit: Payer: Self-pay | Admitting: Nurse Practitioner

## 2017-08-05 DIAGNOSIS — I10 Essential (primary) hypertension: Secondary | ICD-10-CM

## 2017-08-11 ENCOUNTER — Telehealth: Payer: Self-pay | Admitting: Nurse Practitioner

## 2017-08-11 NOTE — Telephone Encounter (Signed)
Left msg asking pt to call and schedule AWV-I at [facility] clinic on morning of 08/13/17 if available. VDM (DD)

## 2017-08-26 ENCOUNTER — Ambulatory Visit: Payer: Medicare Other | Admitting: Nurse Practitioner

## 2017-08-27 ENCOUNTER — Other Ambulatory Visit: Payer: Self-pay | Admitting: Nurse Practitioner

## 2017-08-27 NOTE — Telephone Encounter (Signed)
[facility] [place] 

## 2017-08-28 ENCOUNTER — Non-Acute Institutional Stay: Payer: Medicare Other

## 2017-08-28 VITALS — BP 132/72 | HR 87 | Temp 97.4°F | Ht 65.0 in

## 2017-08-28 DIAGNOSIS — Z0000 Encounter for general adult medical examination without abnormal findings: Secondary | ICD-10-CM

## 2017-08-28 NOTE — Progress Notes (Signed)
Subjective:   Jessica [name] is a 83 y.o. female who presents for an Initial Medicare Annual Wellness Visit at [facility]        Objective:    Today's Vitals   08/28/17 1455  BP: 132/72  Pulse: 87  Temp: (!) 97.4 F (36.3 C)  TempSrc: Oral  SpO2: 90%  Height: 5\' 5"  (1.651 m)   Body mass index is 17.43 kg/m.  Advanced Directives 08/28/2017 05/28/2017 05/20/2017 05/20/2017 05/14/2017 10/23/2016 01/17/2016  Does Patient Have a Medical Advance Directive? Yes Yes Yes Yes Yes Yes Yes  Type of Paramedic of [place];Out of facility DNR (pink MOST or yellow form) [place];Living will;Out of facility DNR (pink MOST or yellow form) Living will;Healthcare Power of [place];Out of facility DNR (pink MOST or yellow form) [place];Living will [place];Living will;Out of facility DNR (pink MOST or yellow form) [place];Out of facility DNR (pink MOST or yellow form) Healthcare Power of Attorney  Does patient want to make changes to medical advance directive? No - Patient declined No - Patient declined No - Patient declined No - Patient declined No - Patient declined No - Patient declined No - Patient declined  Copy of [place] in Chart? Yes Yes Yes - Yes Yes Yes  Pre-existing out of facility DNR order (yellow form or pink MOST form) Yellow form placed in chart (order not valid for inpatient use);Pink MOST form placed in chart (order not valid for inpatient use) Yellow form placed in chart (order not valid for inpatient use) - - Yellow form placed in chart (order not valid for inpatient use) Yellow form placed in chart (order not valid for inpatient use) -    Current Medications (verified) Outpatient Encounter Medications as of 08/28/2017  Medication Sig  . LAMICTAL 150 MG tablet Take 1 tablet (150 mg total) by mouth every morning.  Marland Kitchen  LAMICTAL 200 MG tablet Take 1 tablet (200 mg total) by mouth at bedtime.  Marland Kitchen losartan (COZAAR) 100 MG tablet TAKE ONE TABLET BY MOUTH DAILY  . metoprolol succinate (TOPROL-XL) 50 MG 24 hr tablet TAKE ONE TABLET BY MOUTH DAILY  . zonisamide (ZONEGRAN) 100 MG capsule One in the morning and 2 tabs at night   No facility-administered encounter medications on file as of 08/28/2017.     Allergies (verified) Aspirin; Effexor [venlafaxine hcl]; Keflex [cephalexin]; Venlafaxine; Penicillins; and Zithromax [azithromycin]   History: Past Medical History:  Diagnosis Date  . Alcohol abuse 11/11/2010  . Alcohol abuse, unspecified   . Anxiety   . Atrial fibrillation ([place]) 11/11/2010  . Bone infection of left hand ([place]) 12/22/2008   secondary to injury  . Cancer ([place])    colon cancer-hx of chemo  . Chronic airway obstruction, not elsewhere classified 10/01/2011  . Confusion   . COPD (chronic obstructive pulmonary disease) ([place])   . Dizziness and giddiness 04/24/2005  . Edema 12/03/2011  . Embolism - blood clot 1996   left breast  . Generalized convulsive epilepsy without mention of intractable epilepsy   . GERD (gastroesophageal reflux disease)   . Hemorrhoids   . Hyperlipidemia   . Hypertension   . Insomnia, unspecified 11/11/2010  . Major depressive disorder, single episode, unspecified 11/11/2010  . Malignant neoplasm of stomach, unspecified site 11/11/2010  . Melanoma ([place])    metastatic  . Peripheral vascular disease ([place])   . Seizures ([place])   .  Symptomatic menopausal or female climacteric states 11/11/2010  . Tachycardia, unspecified   . Tobacco use disorder   . Urgency of urination    Past Surgical History:  Procedure Laterality Date  . ABDOMINAL HYSTERECTOMY  1963  . BLADDER REPAIR    . breast biopsies     x2 on left, x 1 on right  . c-sections  1957, 1959, 1961   x3  . COLON RESECTION  1995  . COLONOSCOPY  10/21/2012   Dr. Paulita Fujita diverticulosis, sessile polyps biopsy   . heart  ablation  1997  . HEMORRHOID SURGERY    . laser surgery to right eye  10/04/2007  . left femoral artery stent  05/07/2004  . melanoma removed from left groin  09/2004  . melanoma removed from left leg  09/06/2004  . orif left humerus fracture  03/27/2005  . porta cath placement  1995  . porta cath removal  1996  . removal of tumor in left knee  11/15/2010  . skin graft to left leg  12/05/2004  . [place]   No family history on file. Social History   Socioeconomic History  . Marital status: Widowed    Spouse name: Gwyndolyn Saxon  . Number of children: 3  . Years of education: college  . Highest education level: None  Social Needs  . Financial resource strain: Not hard at all  . Food insecurity - worry: Never true  . Food insecurity - inability: Never true  . Transportation needs - medical: No  . Transportation needs - non-medical: No  Occupational History  . Occupation: Arboriculturist: RETIRED    Comment: retired  Tobacco Use  . Smoking status: Current Every Day Smoker    Packs/day: 0.33    Years: 60.00    Pack years: 19.80    Types: Cigarettes  . Smokeless tobacco: Never Used  Substance and Sexual Activity  . Alcohol use: No    Comment: quit 02-02-2010  . Drug use: No  . Sexual activity: No  Other Topics Concern  . None  Social History Narrative   Patient is retired. Patient lives at [facility].    Education- college education.   Right handed.   Caffeine- some. Two cups daily.   Widowed    Exercise no   Alcohol none   Smokes about half pack daily     Tobacco Counseling Ready to quit: Not Answered Counseling given: Not Answered   Clinical Intake:  Pre-visit preparation completed: No  Pain : No/denies pain     Nutritional Risks: None Diabetes: No  Activities of Daily Living: Independent Ambulation: Independent with device- listed below Home Assistive Devices/Equipment: Contact lenses Medication Administration:  Independent Home Management: Independent  Barriers to Care Management & Learning: None  Do you feel unsafe in your current relationship?: No Do you feel physically threatened by others?: No Anyone hurting you at home, work, or school?: No Unable to ask?: No Information provided on Community resources: No  How often do you need to have someone help you when you read instructions, pamphlets, or other written materials from your doctor or pharmacy?: 1 - Never What is the last grade level you completed in school?: [employer]  Interpreter Needed?: No  Information entered by :: Rich Reining, RN   Activities of Daily Living In your present state of health, do you have any difficulty performing the following activities: 08/28/2017 05/20/2017  Hearing? Y N  Vision? Aggie Moats  Difficulty concentrating or making decisions? Tempie Donning  Walking or climbing stairs? Y Y  Dressing or bathing? Y Y  Doing errands, shopping? Y N  Preparing Food and eating ? Y -  Using the Toilet? Y -  In the past six months, have you accidently leaked urine? N -  Do you have problems with loss of bowel control? N -  Managing your Medications? N -  Managing your Finances? N -  Housekeeping or managing your Housekeeping? N -  Some recent data might be hidden    Timed Get Up and Go Performed: 9 seconds to walk 10 feet- within normal limits  Immunizations and Health Maintenance Immunization History  Administered Date(s) Administered  . Influenza Whole 06/29/2012, 07/13/2013  . Influenza-Unspecified 07/17/2014   Health Maintenance Due  Topic Date Due  . TETANUS/TDAP  05/28/1951  . PNA vac Low Risk Adult (1 of 2 - PCV13) 05/29/1999  . INFLUENZA VACCINE  04/29/2017    Patient Care Team: Mast, Man X, NP as PCP - General (Internal Medicine)  Indicate any recent Medical Services you may have received from other than [facility] providers in the past year (date may be approximate).     Assessment:   This is a routine  wellness examination for Jessica [name].   Hearing/Vision screen Vision Screening Comments: Sees Dr. Su Ley annually  Dietary issues and exercise activities discussed: Current Exercise Habits: The patient does not participate in regular exercise at present, Exercise limited by: None identified  Goals    None     Depression Screen PHQ 2/9 Scores 08/28/2017 06/30/2017 01/17/2016 09/27/2015  PHQ - 2 Score 6 6 6  0  PHQ- 9 Score 15 27 6  -    Fall Risk Fall Risk  08/28/2017 06/30/2017 01/17/2016 09/27/2015 07/19/2015  Falls in the past year? No Yes No No No  Number falls in past yr: - 1 - - -  Injury with Fall? - No - - -  Follow up - Falls evaluation completed;Education provided;Falls prevention discussed - - -    Is the patient's home free of loose throw rugs in walkways, pet beds, electrical cords, etc?   yes      Grab bars in the bathroom? yes      Handrails on the stairs?   yes      Adequate lighting?   yes  Cognitive Function: MMSE - Mini Mental State Exam 08/28/2017  Orientation to time 5  Orientation to Place 5  Registration 3  Attention/ Calculation 5  Recall 3  Language- name 2 objects 2  Language- repeat 1  Language- follow 3 step command 3  Language- read & follow direction 1  Write a sentence 1  Copy design 0  Total score 29        Screening Tests Health Maintenance  Topic Date Due  . TETANUS/TDAP  05/28/1951  . PNA vac Low Risk Adult (1 of 2 - PCV13) 05/29/1999  . INFLUENZA VACCINE  04/29/2017  . DEXA SCAN  Completed    Cancer Screenings: Lung: Low Dose CT Chest recommended if Age 20-80 years, 30 pack-year currently smoking OR have quit w/in 15years. Patient does not qualify. Breast:  Up to date on Mammogram? Yes  Up to date of Bone Density/Dexa? Yes Colorectal: up to date, pt is over age 72  Additional Screenings:  Hepatitis B/HIV/Syphillis:Declined Hepatitis C Screening: Declined     Plan:  I have personally reviewed and addressed the Medicare  Annual Wellness questionnaire and have noted the  following in the patient's chart:  A. Medical and social history B. Use of alcohol, tobacco or illicit drugs  C. Current medications and supplements D. Functional ability and status E.  Nutritional status F.  Physical activity G. Advance directives H. List of other physicians I.  Hospitalizations, surgeries, and ER visits in previous 12 months J.  [place] to include hearing, vision, cognitive, depression L. Referrals and appointments - none  In addition, I have reviewed and discussed with patient certain preventive protocols, quality metrics, and best practice recommendations. A written personalized care plan for preventive services as well as general preventive health recommendations were provided to patient.  See attached scanned questionnaire for additional information.   Signed,   Rich Reining, RN Nurse Health Advisor   Quick Notes   Health Maintenance: Pt declined tdap, flu, pna13, and shingrix     Abnormal Screen: MMSE 29/30. Passed clock drawing. PHQ-9:15     Patient Concerns: none     Nurse Concerns: none

## 2017-08-28 NOTE — Patient Instructions (Signed)
[name] , Thank you for taking time to come for your Medicare Wellness Visit. I appreciate your ongoing commitment to your health goals. Please review the following plan we discussed and let me know if I can assist you in the future.   Screening recommendations/referrals: Colonoscopy excluded, you are over age 81 Mammogram excluded, you are over age 81 Bone Density up to date Recommended yearly ophthalmology/optometry visit for glaucoma screening and checkup Recommended yearly dental visit for hygiene and checkup  Vaccinations: Influenza vaccine due, declined for now Pneumococcal vaccine due, declined for now Tdap vaccine due, declined for now Shingles vaccine due, declined for now    Advanced directives: In Chart   Conditions/risks identified: none  Next appointment: Dr. Bubba Camp 10/06/2017 @ 11am   Preventive Care 65 Years and Older, Female Preventive care refers to lifestyle choices and visits with your health care provider that can promote health and wellness. What does preventive care include?  A yearly physical exam. This is also called an annual well check.  Dental exams once or twice a year.  Routine eye exams. Ask your health care provider how often you should have your eyes checked.  Personal lifestyle choices, including:  Daily care of your teeth and gums.  Regular physical activity.  Eating a healthy diet.  Avoiding tobacco and drug use.  Limiting alcohol use.  Practicing safe sex.  Taking low-dose aspirin every day.  Taking vitamin and mineral supplements as recommended by your health care provider. What happens during an annual well check? The services and screenings done by your health care provider during your annual well check will depend on your age, overall health, lifestyle risk factors, and family history of disease. Counseling  Your health care provider may ask you questions about your:  Alcohol use.  Tobacco use.  Drug  use.  Emotional well-being.  Home and relationship well-being.  Sexual activity.  Eating habits.  History of falls.  Memory and ability to understand (cognition).  Work and work Statistician.  Reproductive health. Screening  You may have the following tests or measurements:  Height, weight, and BMI.  Blood pressure.  Lipid and cholesterol levels. These may be checked every 5 years, or more frequently if you are over 56 years old.  Skin check.  Lung cancer screening. You may have this screening every year starting at age 90 if you have a 30-pack-year history of smoking and currently smoke or have quit within the past 15 years.  Fecal occult blood test (FOBT) of the stool. You may have this test every year starting at age 43.  Flexible sigmoidoscopy or colonoscopy. You may have a sigmoidoscopy every 5 years or a colonoscopy every 10 years starting at age 78.  Hepatitis C blood test.  Hepatitis B blood test.  Sexually transmitted disease (STD) testing.  Diabetes screening. This is done by checking your blood sugar (glucose) after you have not eaten for a while (fasting). You may have this done every 1-3 years.  Bone density scan. This is done to screen for osteoporosis. You may have this done starting at age 76.  Mammogram. This may be done every 1-2 years. Talk to your health care provider about how often you should have regular mammograms. Talk with your health care provider about your test results, treatment options, and if necessary, the need for more tests. Vaccines  Your health care provider may recommend certain vaccines, such as:  Influenza vaccine. This is recommended every year.  Tetanus, diphtheria, and acellular  pertussis (Tdap, Td) vaccine. You may need a Td booster every 10 years.  Zoster vaccine. You may need this after age 90.  Pneumococcal 13-valent conjugate (PCV13) vaccine. One dose is recommended after age 84.  Pneumococcal polysaccharide  (PPSV23) vaccine. One dose is recommended after age 70. Talk to your health care provider about which screenings and vaccines you need and how often you need them. This information is not intended to replace advice given to you by your health care provider. Make sure you discuss any questions you have with your health care provider. Document Released: 10/12/2015 Document Revised: 06/04/2016 Document Reviewed: 07/17/2015 Elsevier Interactive Patient Education  2017 [place] Prevention in the Home Falls can cause injuries. They can happen to people of all ages. There are many things you can do to make your home safe and to help prevent falls. What can I do on the outside of my home?  Regularly fix the edges of walkways and driveways and fix any cracks.  Remove anything that might make you trip as you walk through a door, such as a raised step or threshold.  Trim any bushes or trees on the path to your home.  Use bright outdoor lighting.  Clear any walking paths of anything that might make someone trip, such as rocks or tools.  Regularly check to see if handrails are loose or broken. Make sure that both sides of any steps have handrails.  Any raised decks and porches should have guardrails on the edges.  Have any leaves, snow, or ice cleared regularly.  Use sand or salt on walking paths during winter.  Clean up any spills in your garage right away. This includes oil or grease spills. What can I do in the bathroom?  Use night lights.  Install grab bars by the toilet and in the tub and shower. Do not use towel bars as grab bars.  Use non-skid mats or decals in the tub or shower.  If you need to sit down in the shower, use a plastic, non-slip stool.  Keep the floor dry. Clean up any water that spills on the floor as soon as it happens.  Remove soap buildup in the tub or shower regularly.  Attach bath mats securely with double-sided non-slip rug tape.  Do not have  throw rugs and other things on the floor that can make you trip. What can I do in the bedroom?  Use night lights.  Make sure that you have a light by your bed that is easy to reach.  Do not use any sheets or blankets that are too big for your bed. They should not hang down onto the floor.  Have a firm chair that has side arms. You can use this for support while you get dressed.  Do not have throw rugs and other things on the floor that can make you trip. What can I do in the kitchen?  Clean up any spills right away.  Avoid walking on wet floors.  Keep items that you use a lot in easy-to-reach places.  If you need to reach something above you, use a strong step stool that has a grab bar.  Keep electrical cords out of the way.  Do not use floor polish or wax that makes floors slippery. If you must use wax, use non-skid floor wax.  Do not have throw rugs and other things on the floor that can make you trip. What can I do with my stairs?  Do not leave any items on the stairs.  Make sure that there are handrails on both sides of the stairs and use them. Fix handrails that are broken or loose. Make sure that handrails are as long as the stairways.  Check any carpeting to make sure that it is firmly attached to the stairs. Fix any carpet that is loose or worn.  Avoid having throw rugs at the top or bottom of the stairs. If you do have throw rugs, attach them to the floor with carpet tape.  Make sure that you have a light switch at the top of the stairs and the bottom of the stairs. If you do not have them, ask someone to add them for you. What else can I do to help prevent falls?  Wear shoes that:  Do not have high heels.  Have rubber bottoms.  Are comfortable and fit you well.  Are closed at the toe. Do not wear sandals.  If you use a stepladder:  Make sure that it is fully opened. Do not climb a closed stepladder.  Make sure that both sides of the stepladder are  locked into place.  Ask someone to hold it for you, if possible.  Clearly mark and make sure that you can see:  Any grab bars or handrails.  First and last steps.  Where the edge of each step is.  Use tools that help you move around (mobility aids) if they are needed. These include:  Canes.  Walkers.  Scooters.  Crutches.  Turn on the lights when you go into a dark area. Replace any light bulbs as soon as they burn out.  Set up your furniture so you have a clear path. Avoid moving your furniture around.  If any of your floors are uneven, fix them.  If there are any pets around you, be aware of where they are.  Review your medicines with your doctor. Some medicines can make you feel dizzy. This can increase your chance of falling. Ask your doctor what other things that you can do to help prevent falls. This information is not intended to replace advice given to you by your health care provider. Make sure you discuss any questions you have with your health care provider. Document Released: 07/12/2009 Document Revised: 02/21/2016 Document Reviewed: 10/20/2014 Elsevier Interactive Patient Education  2017 [employer].

## 2017-09-01 ENCOUNTER — Other Ambulatory Visit: Payer: Self-pay | Admitting: Nurse Practitioner

## 2017-09-01 DIAGNOSIS — I10 Essential (primary) hypertension: Secondary | ICD-10-CM

## 2017-09-08 ENCOUNTER — Encounter: Payer: Self-pay | Admitting: Internal Medicine

## 2017-09-08 ENCOUNTER — Other Ambulatory Visit: Payer: Self-pay | Admitting: *Deleted

## 2017-09-08 DIAGNOSIS — I10 Essential (primary) hypertension: Secondary | ICD-10-CM

## 2017-09-08 MED ORDER — LOSARTAN POTASSIUM 100 MG PO TABS: 100.0000 mg | ORAL_TABLET | Freq: Every day | ORAL | 0 refills | Status: DC

## 2017-09-08 NOTE — Telephone Encounter (Signed)
[facility] [place] 

## 2017-09-14 ENCOUNTER — Encounter: Payer: Self-pay | Admitting: Internal Medicine

## 2017-10-01 DIAGNOSIS — I10 Essential (primary) hypertension: Secondary | ICD-10-CM | POA: Diagnosis not present

## 2017-10-01 DIAGNOSIS — I739 Peripheral vascular disease, unspecified: Secondary | ICD-10-CM | POA: Diagnosis not present

## 2017-10-01 DIAGNOSIS — K219 Gastro-esophageal reflux disease without esophagitis: Secondary | ICD-10-CM | POA: Diagnosis not present

## 2017-10-01 LAB — COMPREHENSIVE METABOLIC PANEL
AG RATIO: 1.5 (calc) (ref 1.0–2.5)
ALT: 7 U/L (ref 6–29)
AST: 13 U/L (ref 10–35)
Albumin: 4 g/dL (ref 3.6–5.1)
Alkaline phosphatase (APISO): 95 U/L (ref 33–130)
BUN / CREAT RATIO: 12 (calc) (ref 6–22)
BUN: 14 mg/dL (ref 7–25)
CALCIUM: 9.4 mg/dL (ref 8.6–10.4)
CHLORIDE: 105 mmol/L (ref 98–110)
CO2: 28 mmol/L (ref 20–32)
Creat: 1.16 mg/dL — ABNORMAL HIGH (ref 0.60–0.88)
GLOBULIN: 2.7 g/dL (ref 1.9–3.7)
GLUCOSE: 111 mg/dL — AB (ref 65–99)
Potassium: 4.3 mmol/L (ref 3.5–5.3)
SODIUM: 139 mmol/L (ref 135–146)
TOTAL PROTEIN: 6.7 g/dL (ref 6.1–8.1)
Total Bilirubin: 0.4 mg/dL (ref 0.2–1.2)

## 2017-10-01 LAB — CBC WITH DIFFERENTIAL/PLATELET
BASOS PCT: 1.4 %
Basophils Absolute: 113 cells/uL (ref 0–200)
Eosinophils Absolute: 267 cells/uL (ref 15–500)
Eosinophils Relative: 3.3 %
HCT: 41.3 % (ref 35.0–45.0)
Hemoglobin: 13.5 g/dL (ref 11.7–15.5)
Lymphs Abs: 2276 cells/uL (ref 850–3900)
MCH: 28.1 pg (ref 27.0–33.0)
MCHC: 32.7 g/dL (ref 32.0–36.0)
MCV: 85.9 fL (ref 80.0–100.0)
MONOS PCT: 8.7 %
MPV: 10.2 fL (ref 7.5–12.5)
Neutro Abs: 4739 cells/uL (ref 1500–7800)
Neutrophils Relative %: 58.5 %
PLATELETS: 299 10*3/uL (ref 140–400)
RBC: 4.81 10*6/uL (ref 3.80–5.10)
RDW: 12.7 % (ref 11.0–15.0)
TOTAL LYMPHOCYTE: 28.1 %
WBC mixed population: 705 cells/uL (ref 200–950)
WBC: 8.1 10*3/uL (ref 3.8–10.8)

## 2017-10-01 LAB — LIPID PANEL
Cholesterol: 212 mg/dL — ABNORMAL HIGH (ref <200)
HDL: 74 mg/dL (ref >50)
LDL CHOLESTEROL (CALC): 117 mg/dL — AB
Non-HDL Cholesterol (Calc): 138 mg/dL (calc) — ABNORMAL HIGH (ref <130)
TRIGLYCERIDES: 99 mg/dL (ref <150)
Total CHOL/HDL Ratio: 2.9 (calc) (ref <5.0)

## 2017-10-06 ENCOUNTER — Non-Acute Institutional Stay: Payer: Medicare Other | Admitting: Internal Medicine

## 2017-10-06 ENCOUNTER — Encounter: Payer: Self-pay | Admitting: Internal Medicine

## 2017-10-06 VITALS — BP 128/70 | HR 78 | Temp 97.9°F | Resp 16 | Ht 65.0 in | Wt 101.6 lb

## 2017-10-06 DIAGNOSIS — R634 Abnormal weight loss: Secondary | ICD-10-CM

## 2017-10-06 DIAGNOSIS — E785 Hyperlipidemia, unspecified: Secondary | ICD-10-CM

## 2017-10-06 DIAGNOSIS — G40309 Generalized idiopathic epilepsy and epileptic syndromes, not intractable, without status epilepticus: Secondary | ICD-10-CM

## 2017-10-06 DIAGNOSIS — I10 Essential (primary) hypertension: Secondary | ICD-10-CM | POA: Diagnosis not present

## 2017-10-06 DIAGNOSIS — R739 Hyperglycemia, unspecified: Secondary | ICD-10-CM | POA: Diagnosis not present

## 2017-10-06 DIAGNOSIS — F330 Major depressive disorder, recurrent, mild: Secondary | ICD-10-CM | POA: Diagnosis not present

## 2017-10-06 DIAGNOSIS — F339 Major depressive disorder, recurrent, unspecified: Secondary | ICD-10-CM | POA: Insufficient documentation

## 2017-10-06 DIAGNOSIS — J449 Chronic obstructive pulmonary disease, unspecified: Secondary | ICD-10-CM

## 2017-10-06 MED ORDER — IPRATROPIUM BROMIDE HFA 17 MCG/ACT IN AERS: 2.0000 | INHALATION_SPRAY | Freq: Four times a day (QID) | RESPIRATORY_TRACT | 12 refills | Status: DC | PRN

## 2017-10-06 NOTE — Progress Notes (Signed)
[facility]  Provider: Blanchie Serve MD   Location:  [facility]   Place of Service:  Clinic (12)  PCP: Mast, Man X, NP Patient Care Team: Mast, Man X, NP as PCP - General (Internal Medicine)  Extended Emergency Contact Information Primary Emergency Contact: Powers,Kristine Address: [address], [place] Phone: [phone] Relation: Daughter   Goals of Care: Advanced Directive information Advanced Directives 08/28/2017  Does Patient Have a Medical Advance Directive? Yes  Type of Paramedic of [place];Out of facility DNR (pink MOST or yellow form)  Does patient want to make changes to medical advance directive? No - Patient declined  Copy of [place] in Chart? Yes  Pre-existing out of facility DNR order (yellow form or pink MOST form) Yellow form placed in chart (order not valid for inpatient use);Pink MOST form placed in chart (order not valid for inpatient use)      Chief Complaint  Patient presents with  . Medical Management of Chronic Issues    3 month follow up. No concerns at this time.   . Medication Refill    No refills needed at this time.   Marland Kitchen Results    Discuss labs  . MMSE    30/30. Passed clock drawing   . Depression    9.    HPI: Patient is a 84 y.o. female seen today for routine visit.   Hypertension- taking metoprolol succinate 50 mg daily and losartan 100 mg daily. Denies headache, dizziness, chest pain. Has dyspnea with COPD.   COPD- has chronic cough. Has clear white mucus. Denies blood in sputum. Continues to smoke  Tobacco use- continues to smoke, 1 pack lasts her for 3 days.   Seizure- has history of primary generalized epilepsy. Currently on lamotrigine and zonegran. Followed by neurology. Seizure free.   Past Medical History:  Diagnosis Date  . Alcohol abuse 11/11/2010  . Alcohol abuse, unspecified   . Anxiety    . Atrial fibrillation ([place]) 11/11/2010  . Bone infection of left hand ([place]) 12/22/2008   secondary to injury  . Cancer ([place])    colon cancer-hx of chemo  . Chronic airway obstruction, not elsewhere classified 10/01/2011  . Confusion   . COPD (chronic obstructive pulmonary disease) ([place])   . Dizziness and giddiness 04/24/2005  . Edema 12/03/2011  . Embolism - blood clot 1996   left breast  . Generalized convulsive epilepsy without mention of intractable epilepsy   . GERD (gastroesophageal reflux disease)   . Hemorrhoids   . Hyperlipidemia   . Hypertension   . Insomnia, unspecified 11/11/2010  . Major depressive disorder, single episode, unspecified 11/11/2010  . Malignant neoplasm of stomach, unspecified site 11/11/2010  . Melanoma ([place])    metastatic  . Peripheral vascular disease ([place])   . Seizures ([place])   . Symptomatic menopausal or female climacteric states 11/11/2010  . Tachycardia, unspecified   . Tobacco use disorder   . Urgency of urination    Past Surgical History:  Procedure Laterality Date  . ABDOMINAL HYSTERECTOMY  1963  . BLADDER REPAIR    . breast biopsies     x2 on left, x 1 on right  . c-sections  1957, 1959, 1961   x3  . COLON RESECTION  1995  . COLONOSCOPY  10/21/2012   Dr. Paulita Fujita diverticulosis, sessile polyps biopsy   . heart ablation  [place]    . laser surgery to right eye  10/04/2007  . left femoral artery stent  05/07/2004  . melanoma removed from left groin  09/2004  . melanoma removed from left leg  09/06/2004  . orif left humerus fracture  03/27/2005  . porta cath placement  1995  . porta cath removal  1996  . removal of tumor in left knee  11/15/2010  . skin graft to left leg  12/05/2004  . [place]    reports that she has been smoking cigarettes.  She has a 19.80 pack-year smoking history. she has never used smokeless tobacco. She reports that she does not drink alcohol or use drugs. Social History    Socioeconomic History  . Marital status: Widowed    Spouse name: Gwyndolyn Saxon  . Number of children: 3  . Years of education: college  . Highest education level: Not on file  Social Needs  . Financial resource strain: Not hard at all  . Food insecurity - worry: Never true  . Food insecurity - inability: Never true  . Transportation needs - medical: No  . Transportation needs - non-medical: No  Occupational History  . Occupation: Arboriculturist: RETIRED    Comment: retired  Tobacco Use  . Smoking status: Current Every Day Smoker    Packs/day: 0.33    Years: 60.00    Pack years: 19.80    Types: Cigarettes  . Smokeless tobacco: Never Used  Substance and Sexual Activity  . Alcohol use: No    Comment: quit 02-02-2010  . Drug use: No  . Sexual activity: No  Other Topics Concern  . Not on file  Social History Narrative   Patient is retired. Patient lives at [facility] alone.    Education- college education.   Right handed.   Caffeine- some. Two cups daily.   Widowed    Exercise no   Alcohol none   Smokes about half pack daily     Functional Status Survey:    History reviewed. No pertinent family history.  Health Maintenance  Topic Date Due  . TETANUS/TDAP  05/28/1953  . PNA vac Low Risk Adult (1 of 2 - PCV13) 05/29/1999  . INFLUENZA VACCINE  04/29/2017  . DEXA SCAN  Completed    Allergies  Allergen Reactions  . Aspirin Nausea And Vomiting  . Effexor [Venlafaxine Hcl]     Seizure  . Keflex [Cephalexin]   . Venlafaxine Other (See Comments)    seizures  . Penicillins Rash  . Zithromax [Azithromycin] Rash    Outpatient Encounter Medications as of 10/06/2017  Medication Sig  . LAMICTAL 150 MG tablet Take 1 tablet (150 mg total) by mouth every morning.  Marland Kitchen LAMICTAL 200 MG tablet Take 1 tablet (200 mg total) by mouth at bedtime.  Marland Kitchen losartan (COZAAR) 100 MG tablet Take 1 tablet (100 mg total) by mouth daily.  . metoprolol succinate (TOPROL-XL) 50  MG 24 hr tablet TAKE ONE TABLET BY MOUTH DAILY  . zonisamide (ZONEGRAN) 100 MG capsule One in the morning and 2 tabs at night   No facility-administered encounter medications on file as of 10/06/2017.     Review of Systems  Constitutional: Positive for appetite change and fatigue. Negative for chills and fever.  HENT: Positive for congestion, hearing loss, rhinorrhea, sneezing and voice change. Negative for mouth sores, sinus pressure, sinus pain and trouble swallowing.  History of vocal cord surgeries in past  Eyes: Negative for discharge and itching.  Respiratory: Positive for cough, shortness of breath and wheezing. Negative for choking and chest tightness.        Dyspnea with moderate exertion. No dyspnea at rest.  Cardiovascular: Positive for leg swelling. Negative for chest pain and palpitations.       Chronic left leg swelling post surgery history, subsided some  Gastrointestinal: Negative for abdominal pain, constipation, nausea and vomiting.  Genitourinary: Negative for dysuria and hematuria.  Musculoskeletal: Negative for back pain and gait problem.  Skin: Negative for rash.  Neurological: Positive for numbness. Negative for dizziness and headaches.       Chronic numbness to left leg  Psychiatric/Behavioral: Negative for confusion and sleep disturbance.    Vitals:   10/06/17 1116  BP: 128/70  Pulse: 78  Resp: 16  Temp: 97.9 F (36.6 C)  TempSrc: Oral  SpO2: 90%  Weight: 101 lb 9.6 oz (46.1 kg)  Height: 5' 5"  (1.651 m)   Body mass index is 16.91 kg/m.   Wt Readings from Last 3 Encounters:  10/06/17 101 lb 9.6 oz (46.1 kg)  07/14/17 104 lb 12 oz (47.5 kg)  06/30/17 103 lb (46.7 kg)    Physical Exam  Constitutional: She is oriented to person, place, and time.  Thin built, frail, elderly female in no acute distress  HENT:  Head: Normocephalic and atraumatic.  Mouth/Throat: Oropharyngeal exudate present.  Mild oropharyngeal erythema, clear mucus at back of  her throat  Eyes: Conjunctivae and EOM are normal. Pupils are equal, round, and reactive to light. Right eye exhibits no discharge. Left eye exhibits no discharge.  Neck: Normal range of motion. Neck supple.  Cardiovascular: Normal rate and regular rhythm.  Pulmonary/Chest: Effort normal. No respiratory distress. She has no wheezes. She has no rales. She exhibits no tenderness.  Some basilar rales that clears with coughing  Abdominal: Soft. Bowel sounds are normal. There is no tenderness.  Musculoskeletal: She exhibits no edema.  Lymphadenopathy:    She has no cervical adenopathy.  Neurological: She is alert and oriented to person, place, and time.  10/06/17- MMSE 30/30, passed clock draw  Skin: Skin is warm and dry. No rash noted. She is not diaphoretic.  Psychiatric: She has a normal mood and affect. Her behavior is normal.      Labs reviewed: Basic Metabolic Panel: Recent Labs    05/20/17 2151 05/21/17 0324 05/22/17 0651 10/01/17 0735  NA  --  138 142 139  K  --  3.0* 4.4 4.3  CL  --  108 115* 105  CO2  --  22 23 28   GLUCOSE  --  80 102* 111*  BUN  --  16 10 14   CREATININE  --  0.88 0.84 1.16*  CALCIUM  --  8.5* 8.7* 9.4  MG 1.9 1.9 1.7  --    Liver Function Tests: Recent Labs    05/20/17 1252 05/20/17 2151 05/21/17 0324 10/01/17 0735  AST 18 19 14* 13  ALT 12* 12* 8* 7  ALKPHOS 82 85 68  --   BILITOT 0.6 0.8 1.0 0.4  PROT 6.8 7.2 5.6* 6.7  ALBUMIN 3.9 3.9 3.2*  --    No results for input(s): LIPASE, AMYLASE in the last 8760 hours. Recent Labs    05/20/17 1754  AMMONIA 36*   CBC: Recent Labs    05/20/17 1252  05/21/17 0324 05/22/17 0651 10/01/17 0735  WBC 8.8  --  6.6 6.2 8.1  NEUTROABS 7.0  --   --   --  4,739  HGB 13.7  --  12.1 11.5* 13.5  HCT 42.1   < > 37.7 36.7 41.3  MCV 89.0  --  88.9 90.6 85.9  PLT 234  --  221 207 299   < > = values in this interval not displayed.   Cardiac Enzymes: Recent Labs    05/20/17 1754  CKTOTAL 81    BNP: Invalid input(s): POCBNP No results found for: HGBA1C Lab Results  Component Value Date   TSH 1.553 05/20/2017   Lab Results  Component Value Date   VITAMINB12 238 05/20/2017   Lab Results  Component Value Date   FOLATE 14.3 10/03/2011   No results found for: IRON, TIBC, FERRITIN  Lipid Panel: Recent Labs    05/21/17 0324 10/01/17 0735  CHOL 148 212*  HDL 53 74  LDLCALC 82  --   TRIG 66 99  CHOLHDL 2.8 2.9   No results found for: HGBA1C  Procedures since last visit: No results found.   Fall Risk  10/06/2017 08/28/2017 06/30/2017 04/24/2017 01/17/2016  Falls in the past year? Yes No Yes No No  Comment - - - [place] Telephone Survey: data to providers prior to load -  Number falls in past yr: 1 - 1 - -  Injury with Fall? Yes - No - -  Follow up - - Falls evaluation completed;Education provided;Falls prevention discussed - -    Depression screen [facility] 2/9 10/06/2017 08/28/2017 06/30/2017  Decreased Interest 3 3 3   Down, Depressed, Hopeless 3 3 3   PHQ - 2 Score 6 6 6   Altered sleeping 0 2 3  Tired, decreased energy 0 0 3  Change in appetite 3 2 3   Feeling bad or failure about yourself  (No Data) 1 3  Trouble concentrating 0 2 3  Moving slowly or fidgety/restless 0 2 3  Suicidal thoughts 0 0 3  PHQ-9 Score 9 15 27   Difficult doing work/chores - (No Data) Somewhat difficult    Assessment/Plan  1. Chronic obstructive pulmonary disease, unspecified COPD type ([place]) Has rales that clears with cough. Continues to smoke. Losing weight. Has clinical picture of COPD/ emphysema. Start atrovent q6h prn dyspnea. - Ambulatory referral to Pulmonology  2. Essential hypertension Continue metoprolol succinate and losartan for now, monitor BP readings. Reviewed lab result with patient.  - CMP with eGFR; Future  3. Generalized convulsive epilepsy ([place]) Continue current regimen, reviewed neurology note.   4. Hyperlipidemia, unspecified hyperlipidemia type Pt does not want  statin at present. She would like to watch her diet and repeat labs next visit. Risk factor of age and HTN - Lipid Panel; Future  5. Loss of weight Her COPD likely contributing to this along with depression. Encourage po intake for now. Reviewed about nutritional supplements  6. Hyperglycemia - Hemoglobin A1c; Future  7. Mild episode of recurrent major depressive disorder ([place]) Pt refuses medication treatment and/ or counselling services. Denies suicidal or homicidal ideation. counselled on seeking help if she needs it. Pt voices understanding this. Supportive care.     Labs/tests ordered:   Lab Orders     CMP with eGFR     Lipid Panel     Hemoglobin A1c   Next appointment: 4 months   Communication: reviewed care plan with patient    Blanchie Serve, MD Internal Medicine [facility] [address] [place], [place] [address] Cell  Phone (Monday-Friday 8 am - 5 pm): [phone] On Call: [phone] and follow prompts after 5 pm and on weekends Office Phone: [phone] Office Fax: [phone]

## 2017-10-06 NOTE — Patient Instructions (Signed)

## 2017-10-29 ENCOUNTER — Ambulatory Visit (INDEPENDENT_AMBULATORY_CARE_PROVIDER_SITE_OTHER): Payer: Medicare Other | Admitting: Pulmonary Disease

## 2017-10-29 ENCOUNTER — Ambulatory Visit (INDEPENDENT_AMBULATORY_CARE_PROVIDER_SITE_OTHER)
Admission: RE | Admit: 2017-10-29 | Discharge: 2017-10-29 | Disposition: A | Discharge status: Home / Self Care | Payer: Medicare Other | Source: Ambulatory Visit | Attending: Pulmonary Disease | Admitting: Pulmonary Disease

## 2017-10-29 ENCOUNTER — Encounter: Payer: Self-pay | Admitting: Pulmonary Disease

## 2017-10-29 VITALS — BP 142/82 | HR 80 | Ht 64.5 in | Wt 104.6 lb

## 2017-10-29 DIAGNOSIS — R0602 Shortness of breath: Secondary | ICD-10-CM | POA: Diagnosis not present

## 2017-10-29 DIAGNOSIS — J449 Chronic obstructive pulmonary disease, unspecified: Secondary | ICD-10-CM | POA: Diagnosis not present

## 2017-10-29 NOTE — Progress Notes (Signed)
Jessica [name]    [id number]    04-Jan-1932  Primary Care Physician:Mast, Man X, NP  Referring Physician: Blanchie Serve, MD [address] [place], [place] [address]  Chief complaint: Consult for COPD  HPI: 84 year old with history of COPD, hypertension, hyperlipidemia, active smoker.  Has complains of chronic dyspnea with activity, productive cough with white mucus, occasional wheezing.  Referred to pulmonary for further evaluation She has been started on Atrovent inhaler by her primary care this month and she is using this only once a day but still reports significant improvement and dyspnea.  She continues to smoke but is reducing the number of cigarettes.  She is not interested in quitting completely.  Denies any seasonal allergies, has symptoms of acid reflux. She has 30 pound weight loss over the last 4 years.  She attributes this to stress and loss of appetite after losing her husband and son.  Pets: None Occupation: Was a housewife.  Currently lives in assisted living. Exposures: No known exposures.  No mold, hot tub Smoking history: 60-pack-year smoking history.  Continues to smoke 1 pack every 3 days Travel History: Not significant  Outpatient Encounter Medications as of 10/29/2017  Medication Sig  . ipratropium (ATROVENT HFA) 17 MCG/ACT inhaler Inhale 2 puffs into the lungs every 6 (six) hours as needed for wheezing.  Marland Kitchen LAMICTAL 150 MG tablet Take 1 tablet (150 mg total) by mouth every morning.  Marland Kitchen LAMICTAL 200 MG tablet Take 1 tablet (200 mg total) by mouth at bedtime.  Marland Kitchen losartan (COZAAR) 100 MG tablet Take 1 tablet (100 mg total) by mouth daily.  . metoprolol succinate (TOPROL-XL) 50 MG 24 hr tablet TAKE ONE TABLET BY MOUTH DAILY  . zonisamide (ZONEGRAN) 100 MG capsule One in the morning and 2 tabs at night   No facility-administered encounter medications on file as of 10/29/2017.     Allergies as of 10/29/2017 - Review Complete 10/29/2017  Allergen  Reaction Noted  . Aspirin Nausea And Vomiting 09/26/2011  . Effexor [venlafaxine hcl]  02/12/2013  . Keflex [cephalexin]  08/02/2015  . Venlafaxine Other (See Comments) 10/31/2010  . Penicillins Rash 10/31/2010  . Zithromax [azithromycin] Rash 10/31/2010    Past Medical History:  Diagnosis Date  . Alcohol abuse 11/11/2010  . Alcohol abuse, unspecified   . Anxiety   . Atrial fibrillation ([place]) 11/11/2010  . Bone infection of left hand ([place]) 12/22/2008   secondary to injury  . Cancer ([place])    colon cancer-hx of chemo  . Chronic airway obstruction, not elsewhere classified 10/01/2011  . Confusion   . COPD (chronic obstructive pulmonary disease) ([place])   . Dizziness and giddiness 04/24/2005  . Edema 12/03/2011  . Embolism - blood clot 1996   left breast  . Generalized convulsive epilepsy without mention of intractable epilepsy   . GERD (gastroesophageal reflux disease)   . Hemorrhoids   . Hyperlipidemia   . Hypertension   . Insomnia, unspecified 11/11/2010  . Major depressive disorder, single episode, unspecified 11/11/2010  . Malignant neoplasm of stomach, unspecified site 11/11/2010  . Melanoma ([place])    metastatic  . Peripheral vascular disease ([place])   . Seizures ([place])   . Symptomatic menopausal or female climacteric states 11/11/2010  . Tachycardia, unspecified   . Tobacco use disorder   . Urgency of urination     Past Surgical History:  Procedure Laterality Date  . ABDOMINAL HYSTERECTOMY  1963  . BLADDER REPAIR    .  breast biopsies     x2 on left, x 1 on right  . c-sections  1957, 1959, 1961   x3  . COLON RESECTION  1995  . COLONOSCOPY  10/21/2012   Dr. Paulita Fujita diverticulosis, sessile polyps biopsy   . heart ablation  1997  . HEMORRHOID SURGERY    . laser surgery to right eye  10/04/2007  . left femoral artery stent  05/07/2004  . melanoma removed from left groin  09/2004  . melanoma removed from left leg  09/06/2004  . orif left humerus fracture  03/27/2005  . porta cath  placement  1995  . porta cath removal  1996  . removal of tumor in left knee  11/15/2010  . skin graft to left leg  12/05/2004  . VOCAL CORD INJECTION  1996, 1997    Family History  Problem Relation Age of Onset  . Cancer Mother   . Breast cancer Sister   . Cancer Brother     Social History   Socioeconomic History  . Marital status: Widowed    Spouse name: Gwyndolyn Saxon  . Number of children: 3  . Years of education: college  . Highest education level: Not on file  Social Needs  . Financial resource strain: Not hard at all  . Food insecurity - worry: Never true  . Food insecurity - inability: Never true  . Transportation needs - medical: No  . Transportation needs - non-medical: No  Occupational History  . Occupation: Arboriculturist: RETIRED    Comment: retired  Tobacco Use  . Smoking status: Current Every Day Smoker    Years: 60.00    Types: Cigarettes  . Smokeless tobacco: Never Used  . Tobacco comment: 1 pack last 3 days-10/29/17  Substance and Sexual Activity  . Alcohol use: No    Comment: quit 02-02-2010  . Drug use: No  . Sexual activity: No  Other Topics Concern  . Not on file  Social History Narrative   Patient is retired. Patient lives at [facility] alone.    Education- college education.   Right handed.   Caffeine- some. Two cups daily.   Widowed    Exercise no   Alcohol none   Smokes about half pack daily     Review of systems: Review of Systems  Constitutional: Negative for fever and chills.  HENT: Negative.   Eyes: Negative for blurred vision.  Respiratory: as per HPI  Cardiovascular: Negative for chest pain and palpitations.  Gastrointestinal: Negative for vomiting, diarrhea, blood per rectum. Genitourinary: Negative for dysuria, urgency, frequency and hematuria.  Musculoskeletal: Negative for myalgias, back pain and joint pain.  Skin: Negative for itching and rash.  Neurological: Negative for dizziness, tremors, focal  weakness, seizures and loss of consciousness.  Endo/Heme/Allergies: Negative for environmental allergies.  Psychiatric/Behavioral: Negative for depression, suicidal ideas and hallucinations.  All other systems reviewed and are negative.  Physical Exam: Blood pressure (!) 142/82, pulse 80, height 5' 4.5" (1.638 m), weight 104 lb 9.6 oz (47.4 kg), SpO2 96 %. Gen:      No acute distress HEENT:  EOMI, sclera anicteric Neck:     No masses; no thyromegaly Lungs:    Scattered rhonchi; normal respiratory effort CV:         Regular rate and rhythm; no murmurs Abd:      + bowel sounds; soft, non-tender; no palpable masses, no distension Ext:    No edema; adequate peripheral perfusion Skin:  Warm and dry; no rash Neuro: alert and oriented x 3 Psych: normal mood and affect  Data Reviewed: CT chest 10/10/11-calcified granuloma in the right upper lobe, emphysema.  Bilateral lower lobe collapse, consolidation.  Mediastinal and hilar adenopathy Chest x-ray 05/21/17- hyperinflation, mild atelectasis at the base.  Calcified granuloma in the right upper lobe. I reviewed the images personally.  CBC 10/01/17-WBC 8.1, eosinophils 3.3%, absolute eosinophil count 263  Assessment:  Evaluation for COPD Schedule chest x-ray and pulmonary function test for better evaluation of [name] function Her oxygen levels were stable on exertion.  She will continue on Atrovent inhaler since it appears to be helping even though she is using it only once a day Discussed switching inhaler therapy to long-acting LABA/LAMA but she feels that the Atrovent is adequate for her and does not want to make the change  Plan/Recommendations: - Chest x-ray, PFTs - Continue Atrovent inhaler.  Marshell Garfinkel MD [facility] Pager [phone] 10/29/2017, 11:54 AM  CC: Blanchie Serve, MD

## 2017-10-29 NOTE — Patient Instructions (Signed)
We will check your oxygen levels on exertion Continue your Atrovent inhaler We will schedule chest x-ray and pulmonary function test Follow-up in 2-3 months.

## 2017-11-11 ENCOUNTER — Telehealth: Payer: Self-pay | Admitting: Neurology

## 2017-11-11 NOTE — Telephone Encounter (Signed)
She take brand name Lamictal (150mg  in am and 200mg  in pm).  Her insurance has removed brand name from the formulary.  Her prescription benefit is through [place] ([phone]).  Her member ID is: [id number]. I have verbally initiate prior authorization request for both strengths.  Decision is pending and can take up to 72 hours.  I called the patient to let her know the status.  Lamictal 150mg  - case [id number] Lamictal 200mg  - case EE#[id number]

## 2017-11-11 NOTE — Telephone Encounter (Signed)
Pt called requesting a call back to discuss LAMICTAL 200 MG tablet her insurance is wanting her to be put on generic but pt says it doesn't work for her and the name brand has a co pay of $1000.

## 2017-11-12 ENCOUNTER — Other Ambulatory Visit: Payer: Self-pay | Admitting: *Deleted

## 2017-11-12 MED ORDER — LAMICTAL 200 MG PO TABS: 200.0000 mg | ORAL_TABLET | Freq: Every day | ORAL | 3 refills | Status: DC

## 2017-11-12 MED ORDER — LAMICTAL 150 MG PO TABS: 150.0000 mg | ORAL_TABLET | ORAL | 3 refills | Status: DC

## 2017-11-12 NOTE — Telephone Encounter (Signed)
Brand name Lamictal approved for both strengths.  [employer] [phone].  UY#[id number].  Approval valid 11/12/2018.  Pharmacy and patient aware of approval.  Also, the patient would like printed prescriptions sent to [place] patient assistance program - fax: [phone].  Prescriptions for both strengths have been faxed and confirmed.

## 2017-11-13 ENCOUNTER — Encounter: Payer: Self-pay | Admitting: Internal Medicine

## 2017-11-18 ENCOUNTER — Other Ambulatory Visit: Payer: Self-pay | Admitting: Nurse Practitioner

## 2018-01-04 ENCOUNTER — Ambulatory Visit (INDEPENDENT_AMBULATORY_CARE_PROVIDER_SITE_OTHER): Payer: Medicare Other | Admitting: Adult Health

## 2018-01-04 ENCOUNTER — Ambulatory Visit: Payer: Medicare Other | Admitting: Pulmonary Disease

## 2018-01-04 ENCOUNTER — Encounter: Payer: Self-pay | Admitting: Adult Health

## 2018-01-04 ENCOUNTER — Ambulatory Visit (INDEPENDENT_AMBULATORY_CARE_PROVIDER_SITE_OTHER): Payer: Medicare Other | Admitting: Pulmonary Disease

## 2018-01-04 DIAGNOSIS — E46 Unspecified protein-calorie malnutrition: Secondary | ICD-10-CM | POA: Insufficient documentation

## 2018-01-04 DIAGNOSIS — J449 Chronic obstructive pulmonary disease, unspecified: Secondary | ICD-10-CM | POA: Diagnosis not present

## 2018-01-04 DIAGNOSIS — E441 Mild protein-calorie malnutrition: Secondary | ICD-10-CM

## 2018-01-04 DIAGNOSIS — E43 Unspecified severe protein-calorie malnutrition: Secondary | ICD-10-CM | POA: Insufficient documentation

## 2018-01-04 LAB — PULMONARY FUNCTION TEST
DL/VA % PRED: 44 %
DL/VA: 2.16 ml/min/mmHg/L
DLCO unc % pred: 52 %
DLCO unc: 13 ml/min/mmHg
FEF 25-75 POST: 0.5 L/s
FEF 25-75 Pre: 0.54 L/sec
FEF2575-%CHANGE-POST: -7 %
FEF2575-%PRED-POST: 39 %
FEF2575-%PRED-PRE: 43 %
FEV1-%CHANGE-POST: -2 %
FEV1-%Pred-Post: 68 %
FEV1-%Pred-Pre: 70 %
FEV1-PRE: 1.3 L
FEV1-Post: 1.27 L
FEV1FVC-%CHANGE-POST: 2 %
FEV1FVC-%PRED-PRE: 74 %
FEV6-%Change-Post: -5 %
FEV6-%Pred-Post: 94 %
FEV6-%Pred-Pre: 99 %
FEV6-Post: 2.23 L
FEV6-Pre: 2.35 L
FEV6FVC-%Change-Post: 1 %
FEV6FVC-%Pred-Post: 105 %
FEV6FVC-%Pred-Pre: 103 %
FVC-%CHANGE-POST: -5 %
FVC-%PRED-POST: 90 %
FVC-%PRED-PRE: 95 %
FVC-POST: 2.28 L
FVC-Pre: 2.41 L
POST FEV1/FVC RATIO: 56 %
PRE FEV1/FVC RATIO: 54 %
PRE FEV6/FVC RATIO: 98 %
Post FEV6/FVC ratio: 99 %
RV % pred: 133 %
RV: 3.32 L
TLC % pred: 114 %
TLC: 5.89 L

## 2018-01-04 NOTE — Progress Notes (Signed)
@Patient  ID: Jessica [name], female    DOB: 03-Apr-1932, 84 y.o.   MRN: [id number]  Chief Complaint  Patient presents with  . Follow-up    COPD     Referring provider: Mast, Man X, NP  HPI: 84 year old female, active smoker, seen for pulmonary consult January 2019 for COPD  TEST  CT chest 10/10/11-calcified granuloma in the right upper lobe, emphysema.  Bilateral lower lobe collapse, consolidation.  Mediastinal and hilar adenopathy Chest x-ray 05/21/17- hyperinflation, mild atelectasis at the base.  Calcified granuloma in the right upper lobe.   01/04/2018 Follow up ; COPD  Patient returns for a 82-month follow-up for COPD.  Patient is an active smoker.  She was seen for a pulmonary consult in January 2019 for COPD .   She has been having increased cough, congestion , reflux , and shortness of breath . Weight has trended down 30lbs over last few years. Has been under a lot of stress with loss of husband and son. Says she does feel hungry .  CXR last visit showed COPD   Today PFT shows moderate COPD with an FEV1 at 68%, ratio 56, FVC 90%, DLCO 52%.  No significant bronchodilator response Says she is very sedentary, retired . Says she is not shortness of breath.  Uses Atrovent inhaler every 6hr as needed. Most days uses only once .  Declines PVX and Prevnar .  Smokes 1/3 PPD. Not interested in stopping smoking . Cessation discussed.  Discussed LDCT screening , she is not interested.       Allergies  Allergen Reactions  . Aspirin Nausea And Vomiting  . Effexor [Venlafaxine Hcl]     Seizure  . Keflex [Cephalexin]   . Venlafaxine Other (See Comments)    seizures  . Penicillins Rash  . Zithromax [Azithromycin] Rash    Immunization History  Administered Date(s) Administered  . Influenza Whole 06/29/2012, 07/13/2013  . Influenza-Unspecified 07/17/2014    Past Medical History:  Diagnosis Date  . Alcohol abuse 11/11/2010  . Alcohol abuse, unspecified   . Anxiety   .  Atrial fibrillation ([place]) 11/11/2010  . Bone infection of left hand ([place]) 12/22/2008   secondary to injury  . Cancer ([place])    colon cancer-hx of chemo  . Chronic airway obstruction, not elsewhere classified 10/01/2011  . Confusion   . COPD (chronic obstructive pulmonary disease) ([place])   . Dizziness and giddiness 04/24/2005  . Edema 12/03/2011  . Embolism - blood clot 1996   left breast  . Generalized convulsive epilepsy without mention of intractable epilepsy   . GERD (gastroesophageal reflux disease)   . Hemorrhoids   . Hyperlipidemia   . Hypertension   . Insomnia, unspecified 11/11/2010  . Major depressive disorder, single episode, unspecified 11/11/2010  . Malignant neoplasm of stomach, unspecified site 11/11/2010  . Melanoma ([place])    metastatic  . Peripheral vascular disease ([place])   . Seizures ([place])   . Symptomatic menopausal or female climacteric states 11/11/2010  . Tachycardia, unspecified   . Tobacco use disorder   . Urgency of urination     Tobacco History: Social History   Tobacco Use  Smoking Status Current Every Day Smoker  . Years: 60.00  . Types: Cigarettes  Smokeless Tobacco Never Used  Tobacco Comment   1 pack last 3 days-10/29/17   Ready to quit: No Counseling given: Yes Comment: 1 pack last 3 days-10/29/17   Outpatient Encounter Medications as of 01/04/2018  Medication Sig  . ipratropium (  ATROVENT HFA) 17 MCG/ACT inhaler Inhale 2 puffs into the lungs every 6 (six) hours as needed for wheezing.  Marland Kitchen LAMICTAL 150 MG tablet Take 1 tablet (150 mg total) by mouth every morning.  Marland Kitchen LAMICTAL 200 MG tablet Take 1 tablet (200 mg total) by mouth at bedtime.  Marland Kitchen losartan (COZAAR) 100 MG tablet Take 1 tablet (100 mg total) by mouth daily.  . metoprolol succinate (TOPROL-XL) 50 MG 24 hr tablet TAKE ONE TABLET BY MOUTH DAILY  . zonisamide (ZONEGRAN) 100 MG capsule One in the morning and 2 tabs at night   No facility-administered encounter medications on file as of 01/04/2018.        Review of Systems  Constitutional:   No  weight loss, night sweats,  Fevers, chills, fatigue, or  lassitude.  HEENT:   No headaches,  Difficulty swallowing,  Tooth/dental problems, or  Sore throat,                No sneezing, itching, ear ache, nasal congestion, post nasal drip,   CV:  No chest pain,  Orthopnea, PND, swelling in lower extremities, anasarca, dizziness, palpitations, syncope.   GI  No heartburn, indigestion, abdominal pain, nausea, vomiting, diarrhea, change in bowel habits, loss of appetite, bloody stools.   Resp: No shortness of breath with exertion or at rest.  No excess mucus, no productive cough,  No non-productive cough,  No coughing up of blood.  No change in color of mucus.  No wheezing.  No chest wall deformity  Skin: no rash or lesions.  GU: no dysuria, change in color of urine, no urgency or frequency.  No flank pain, no hematuria   MS:  No joint pain or swelling.  No decreased range of motion.  No back pain.    Physical Exam  BP 104/64 (BP Location: Right Arm, Cuff Size: Normal)   Pulse 70   Ht 5' 4.5" (1.638 m)   Wt 104 lb 12.8 oz (47.5 kg)   SpO2 94%   BMI 17.71 kg/m   GEN: A/Ox3; pleasant , NAD, thin and frail    HEENT:  [place]/AT,  EACs-clear, TMs-wnl, NOSE-clear, THROAT-clear, no lesions, no postnasal drip or exudate noted.   NECK:  Supple w/ fair ROM; no JVD; normal carotid impulses w/o bruits; no thyromegaly or nodules palpated; no lymphadenopathy.    RESP  Clear  P & A; w/o, wheezes/ rales/ or rhonchi. no accessory muscle use, no dullness to percussion  CARD:  RRR, no m/r/g, no peripheral edema, pulses intact, no cyanosis or clubbing.  GI:   Soft & nt; nml bowel sounds; no organomegaly or masses detected.   Musco: Warm bil, no deformities or joint swelling noted.   Neuro: alert, no focal deficits noted.    Skin: Warm, no lesions or rashes    Lab Results:  CBC  BMET  BNP No results found for:  BNP  ProBNP   Imaging: No results found.   Assessment & Plan:   COPD (chronic obstructive pulmonary disease) (HCC) Moderate COPD currently asymptomatic Does not wish to change from Atrovent to combination inhaler Declines low-dose CT screening Declines Prevnar or Pneumovax Smoking cessation was encouraged  Plan  Patient Instructions  Continue Atrovent inhaler as needed Work on not smoking Increase high-protein diet Follow-up with Dr. Vaughan Browner As needed           Rexene Edison, NP 01/04/2018

## 2018-01-04 NOTE — Assessment & Plan Note (Signed)
encouraaged on healthy diet w/ high protein

## 2018-01-04 NOTE — Progress Notes (Signed)
PFT completed 01/04/18

## 2018-01-04 NOTE — Assessment & Plan Note (Signed)
Moderate COPD currently asymptomatic Does not wish to change from Atrovent to combination inhaler Declines low-dose CT screening Declines Prevnar or Pneumovax Smoking cessation was encouraged  Plan  Patient Instructions  Continue Atrovent inhaler as needed Work on not smoking Increase high-protein diet Follow-up with Dr. Vaughan Browner As needed

## 2018-01-04 NOTE — Patient Instructions (Signed)
Continue Atrovent inhaler as needed Work on not smoking Increase high-protein diet Follow-up with Dr. Vaughan Browner As needed

## 2018-01-07 ENCOUNTER — Other Ambulatory Visit: Payer: Self-pay | Admitting: Nurse Practitioner

## 2018-01-07 ENCOUNTER — Other Ambulatory Visit: Payer: Self-pay | Admitting: Internal Medicine

## 2018-01-07 DIAGNOSIS — I10 Essential (primary) hypertension: Secondary | ICD-10-CM

## 2018-01-12 ENCOUNTER — Ambulatory Visit (INDEPENDENT_AMBULATORY_CARE_PROVIDER_SITE_OTHER): Payer: Medicare Other | Admitting: Neurology

## 2018-01-12 ENCOUNTER — Encounter: Payer: Self-pay | Admitting: Neurology

## 2018-01-12 VITALS — BP 151/85 | HR 78 | Ht 64.5 in | Wt 105.5 lb

## 2018-01-12 DIAGNOSIS — G40309 Generalized idiopathic epilepsy and epileptic syndromes, not intractable, without status epilepticus: Secondary | ICD-10-CM

## 2018-01-12 MED ORDER — ZONISAMIDE 100 MG PO CAPS: ORAL_CAPSULE | ORAL | 4 refills | Status: DC
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
## 2018-01-28 ENCOUNTER — Other Ambulatory Visit: Payer: Self-pay

## 2018-02-02 ENCOUNTER — Encounter: Payer: Self-pay | Admitting: Internal Medicine

## 2018-02-03 DIAGNOSIS — E785 Hyperlipidemia, unspecified: Secondary | ICD-10-CM | POA: Diagnosis not present

## 2018-02-03 DIAGNOSIS — R739 Hyperglycemia, unspecified: Secondary | ICD-10-CM | POA: Diagnosis not present

## 2018-02-03 DIAGNOSIS — I10 Essential (primary) hypertension: Secondary | ICD-10-CM | POA: Diagnosis not present

## 2018-02-04 ENCOUNTER — Other Ambulatory Visit: Payer: Medicare Other

## 2018-02-04 DIAGNOSIS — I10 Essential (primary) hypertension: Secondary | ICD-10-CM

## 2018-02-04 DIAGNOSIS — E785 Hyperlipidemia, unspecified: Secondary | ICD-10-CM

## 2018-02-04 DIAGNOSIS — R739 Hyperglycemia, unspecified: Secondary | ICD-10-CM

## 2018-02-05 LAB — COMPLETE METABOLIC PANEL WITH GFR
AG RATIO: 1.7 (calc) (ref 1.0–2.5)
ALBUMIN MSPROF: 4.3 g/dL (ref 3.6–5.1)
ALKALINE PHOSPHATASE (APISO): 78 U/L (ref 33–130)
ALT: 7 U/L (ref 6–29)
AST: 14 U/L (ref 10–35)
BUN / CREAT RATIO: 17 (calc) (ref 6–22)
BUN: 19 mg/dL (ref 7–25)
CHLORIDE: 106 mmol/L (ref 98–110)
CO2: 28 mmol/L (ref 20–32)
Calcium: 9.5 mg/dL (ref 8.6–10.4)
Creat: 1.13 mg/dL — ABNORMAL HIGH (ref 0.60–0.88)
GFR, EST AFRICAN AMERICAN: 52 mL/min/{1.73_m2} — AB (ref >=60)
GFR, Est Non African American: 45 mL/min/{1.73_m2} — ABNORMAL LOW (ref >=60)
Globulin: 2.6 g/dL (calc) (ref 1.9–3.7)
Glucose, Bld: 138 mg/dL — ABNORMAL HIGH (ref 65–99)
Potassium: 4 mmol/L (ref 3.5–5.3)
Sodium: 140 mmol/L (ref 135–146)
TOTAL PROTEIN: 6.9 g/dL (ref 6.1–8.1)
Total Bilirubin: 0.4 mg/dL (ref 0.2–1.2)

## 2018-02-05 LAB — HEMOGLOBIN A1C
HEMOGLOBIN A1C: 5.8 %{Hb} — AB (ref <5.7)
MEAN PLASMA GLUCOSE: 120 (calc)
eAG (mmol/L): 6.6 (calc)

## 2018-02-05 LAB — LIPID PANEL
Cholesterol: 181 mg/dL (ref <200)
HDL: 58 mg/dL (ref >50)
LDL CHOLESTEROL (CALC): 109 mg/dL — AB
NON-HDL CHOLESTEROL (CALC): 123 mg/dL (ref <130)
Total CHOL/HDL Ratio: 3.1 (calc) (ref <5.0)
Triglycerides: 59 mg/dL (ref <150)

## 2018-02-09 ENCOUNTER — Encounter: Payer: Self-pay | Admitting: Internal Medicine

## 2018-02-16 ENCOUNTER — Encounter: Payer: Medicare Other | Admitting: Internal Medicine

## 2018-02-23 ENCOUNTER — Non-Acute Institutional Stay: Payer: Medicare Other | Admitting: Internal Medicine

## 2018-02-23 ENCOUNTER — Encounter: Payer: Self-pay | Admitting: Internal Medicine

## 2018-02-23 VITALS — BP 136/70 | HR 88 | Temp 97.6°F | Resp 16 | Ht 65.0 in | Wt 101.8 lb

## 2018-02-23 DIAGNOSIS — N183 Chronic kidney disease, stage 3 unspecified: Secondary | ICD-10-CM | POA: Insufficient documentation

## 2018-02-23 DIAGNOSIS — F339 Major depressive disorder, recurrent, unspecified: Secondary | ICD-10-CM | POA: Diagnosis not present

## 2018-02-23 DIAGNOSIS — Z23 Encounter for immunization: Secondary | ICD-10-CM

## 2018-02-23 DIAGNOSIS — K219 Gastro-esophageal reflux disease without esophagitis: Secondary | ICD-10-CM | POA: Diagnosis not present

## 2018-02-23 DIAGNOSIS — G40909 Epilepsy, unspecified, not intractable, without status epilepticus: Secondary | ICD-10-CM | POA: Diagnosis not present

## 2018-02-23 DIAGNOSIS — J449 Chronic obstructive pulmonary disease, unspecified: Secondary | ICD-10-CM

## 2018-02-23 DIAGNOSIS — I739 Peripheral vascular disease, unspecified: Secondary | ICD-10-CM

## 2018-02-23 DIAGNOSIS — I10 Essential (primary) hypertension: Secondary | ICD-10-CM | POA: Diagnosis not present

## 2018-02-23 DIAGNOSIS — E785 Hyperlipidemia, unspecified: Secondary | ICD-10-CM

## 2018-02-23 DIAGNOSIS — R7303 Prediabetes: Secondary | ICD-10-CM | POA: Insufficient documentation

## 2018-02-23 MED ORDER — METOPROLOL SUCCINATE ER 50 MG PO TB24: 50.0000 mg | ORAL_TABLET | Freq: Every day | ORAL | 3 refills | Status: DC

## 2018-02-23 MED ORDER — PNEUMOCOCCAL 13-VAL CONJ VACC IM SUSP: 0.5000 mL | Freq: Once | INTRAMUSCULAR | 0 refills | Status: AC

## 2018-02-23 NOTE — Progress Notes (Signed)
[facility]  Provider: Blanchie Serve MD   Location:  [place] of Service:  Clinic (12)  PCP: Gertie Fey, MD Patient Care Team: Gertie Fey, MD as PCP - General (Internal Medicine)  Extended Emergency Contact Information Primary Emergency Contact: Powers,Kristine Address: [address], [place] Phone: [phone] Relation: Daughter  Code Status: DNR  Goals of Care: Advanced Directive information Advanced Directives 08/28/2017  Does Patient Have a Medical Advance Directive? Yes  Type of Paramedic of [place];Out of facility DNR (pink MOST or yellow form)  Does patient want to make changes to medical advance directive? No - Patient declined  Copy of [place] in Chart? Yes  Pre-existing out of facility DNR order (yellow form or pink MOST form) Yellow form placed in chart (order not valid for inpatient use);Pink MOST form placed in chart (order not valid for inpatient use)      Chief Complaint  Patient presents with  . Medical Management of Chronic Issues    4 month follow up  . Medication Refill    Metoprolol (pending)  . Results    Discuss labs     HPI: Patient is a 84 y.o. female seen today for routine visit. This is her 4 month follow up visit.  Hypertension- currently on metoprolol succinate 50 mg daily and losartan 100 mg daily.  Seizure- remains seizure free. Followed by neurology, note rom OV 01/12/18 reviewed. Takes lamotrigine 150 mg am and 200 mg pm with zonisamide.  COPD- followed by pulmonology. OV note from 01/04/18 and 10/29/17 reviewed. Moderate, no recent exacerbation. Has not used her inhaler this am. Continues to smoke. On ipratropium.   Depression- refuses depression screening, mentions being depressed, denies suicidal ideation, refuses trial of medication or counselling.   PVD- denies any open wound  or sore, has chronic left leg edema. Denies leg pain    Past Medical History:  Diagnosis Date  . Alcohol abuse 11/11/2010  . Alcohol abuse, unspecified   . Anxiety   . Atrial fibrillation ([place]) 11/11/2010  . Bone infection of left hand ([place]) 12/22/2008   secondary to injury  . Cancer ([place])    colon cancer-hx of chemo  . Chronic airway obstruction, not elsewhere classified 10/01/2011  . Confusion   . COPD (chronic obstructive pulmonary disease) ([place])   . Dizziness and giddiness 04/24/2005  . Edema 12/03/2011  . Embolism - blood clot 1996   left breast  . Generalized convulsive epilepsy without mention of intractable epilepsy   . GERD (gastroesophageal reflux disease)   . Hemorrhoids   . Hyperlipidemia   . Hypertension   . Insomnia, unspecified 11/11/2010  . Major depressive disorder, single episode, unspecified 11/11/2010  . Malignant neoplasm of stomach, unspecified site 11/11/2010  . Melanoma ([place])    metastatic  . Peripheral vascular disease ([place])   . Seizures ([place])   . Symptomatic menopausal or female climacteric states 11/11/2010  . Tachycardia, unspecified   . Tobacco use disorder   . Urgency of urination    Past Surgical History:  Procedure Laterality Date  . ABDOMINAL HYSTERECTOMY  1963  . BLADDER REPAIR    . breast biopsies     x2 on left, x 1 on right  . c-sections  1957, 1959, 1961   x3  . COLON RESECTION  1995  . COLONOSCOPY  10/21/2012  Dr. Paulita Fujita diverticulosis, sessile polyps biopsy   . heart ablation  1997  . HEMORRHOID SURGERY    . laser surgery to right eye  10/04/2007  . left femoral artery stent  05/07/2004  . melanoma removed from left groin  09/2004  . melanoma removed from left leg  09/06/2004  . orif left humerus fracture  03/27/2005  . porta cath placement  1995  . porta cath removal  1996  . removal of tumor in left knee  11/15/2010  . skin graft to left leg  12/05/2004  . [place]    reports that she has been smoking cigarettes.   She has smoked for the past 60.00 years. She has never used smokeless tobacco. She reports that she does not drink alcohol or use drugs. Social History   Socioeconomic History  . Marital status: Widowed    Spouse name: Gwyndolyn Saxon  . Number of children: 3  . Years of education: college  . Highest education level: Not on file  Occupational History  . 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 Financial resource strain: Not hard at all  . Food insecurity:    Worry: Never true    Inability: Never true  . Transportation needs:    Medical: No    Non-medical: No  Tobacco Use  . Smoking status: Current Every Day Smoker    Years: 60.00    Types: Cigarettes  . Smokeless tobacco: Never Used  . Tobacco comment: 1 pack last 3 days-10/29/17  Substance and Sexual Activity  . Alcohol use: No    Comment: quit 02-02-2010  . Drug use: No  . Sexual activity: Never  Lifestyle  . Physical activity:    Days per week: 0 days    Minutes per session: 0 min  . Stress: Rather much  Relationships  . Social connections:    Talks on phone: Never    Gets together: Once a week    Attends religious service: Never    Active member of club or organization: No    Attends meetings of clubs or organizations: Never    Relationship status: Widowed  . Intimate partner violence:    Fear of current or ex partner: No    Emotionally abused: No    Physically abused: No    Forced sexual activity: No  Other Topics Concern  . Not on file  Social History Narrative   Patient is retired. Patient lives at [facility] alone.    Education- college education.   Right handed.   Caffeine- some. Two cups daily.   Widowed    Exercise no   Alcohol none   Smokes about half pack daily     Functional Status Survey:    Family History  Problem Relation Age of Onset  . Cancer Mother   . Breast cancer Sister   . Cancer Brother     Health Maintenance  Topic Date Due    . FOOT EXAM  05/28/1942  . OPHTHALMOLOGY EXAM  05/28/1942  . TETANUS/TDAP  05/28/1953  . PNA vac Low Risk Adult (1 of 2 - PCV13) 05/29/1999  . INFLUENZA VACCINE  07/01/2018 (Originally 04/29/2018)  . HEMOGLOBIN A1C  08/06/2018  . DEXA SCAN  Completed    Allergies  Allergen Reactions  . Aspirin Nausea And Vomiting  . Effexor [Venlafaxine Hcl]     Seizure  . Keflex [Cephalexin]   . Venlafaxine  Other (See Comments)    seizures  . Penicillins Rash  . Zithromax [Azithromycin] Rash    Outpatient Encounter Medications as of 02/23/2018  Medication Sig  . ipratropium (ATROVENT HFA) 17 MCG/ACT inhaler Inhale 2 puffs into the lungs every 6 (six) hours as needed for wheezing.  Marland Kitchen LAMICTAL 150 MG tablet Take 1 tablet (150 mg total) by mouth every morning.  Marland Kitchen LAMICTAL 200 MG tablet Take 1 tablet (200 mg total) by mouth at bedtime.  Marland Kitchen losartan (COZAAR) 100 MG tablet TAKE ONE TABLET BY MOUTH DAILY  . metoprolol succinate (TOPROL-XL) 50 MG 24 hr tablet TAKE ONE TABLET BY MOUTH DAILY  . zonisamide (ZONEGRAN) 100 MG capsule One in the morning and 2 tabs at night   No facility-administered encounter medications on file as of 02/23/2018.     Review of Systems  Constitutional: Negative for appetite change, chills and fever.  HENT: Positive for rhinorrhea. Negative for congestion, hearing loss, mouth sores, sore throat and trouble swallowing.   Respiratory: Positive for cough. Negative for chest tightness, shortness of breath and wheezing.        Has chronic cough with COPD, continues to smoke 7-9 cigarettes a day  Cardiovascular: Positive for leg swelling. Negative for chest pain and palpitations.       Chronic left leg edema  Gastrointestinal: Negative for abdominal pain, constipation, nausea and vomiting.  Genitourinary: Negative for dysuria and frequency.  Musculoskeletal: Negative for arthralgias, back pain and gait problem.       No fall reported  Skin: Negative for rash and wound.   Neurological: Positive for dizziness. Negative for headaches.       Has history of seizure, no recent seizure reported. Followed by neurology  Hematological: Bruises/bleeds easily.  Psychiatric/Behavioral: Positive for dysphoric mood. Negative for confusion, sleep disturbance and suicidal ideas. The patient is not nervous/anxious.        Chronic depression, has refused medications or counselling    Vitals:   02/23/18 0827  BP: 136/70  Pulse: 88  Resp: 16  Temp: 97.6 F (36.4 C)  TempSrc: Oral  SpO2: 96%  Weight: 101 lb 12.8 oz (46.2 kg)  Height: 5' 5" (1.651 m)   Body mass index is 16.94 kg/m.   Wt Readings from Last 3 Encounters:  02/23/18 101 lb 12.8 oz (46.2 kg)  01/12/18 105 lb 8 oz (47.9 kg)  01/04/18 104 lb 12.8 oz (47.5 kg)   Physical Exam  Constitutional: She is oriented to person, place, and time. No distress.  Thin built, frail, elderly  HENT:  Head: Normocephalic and atraumatic.  Nose: Nose normal.  Mouth/Throat: Oropharynx is clear and moist. No oropharyngeal exudate.  Eyes: Pupils are equal, round, and reactive to light. Conjunctivae and EOM are normal. Right eye exhibits no discharge. Left eye exhibits no discharge.  Neck: Normal range of motion. Neck supple. No thyromegaly present.  Cardiovascular: Normal rate and regular rhythm.  Pulmonary/Chest: Effort normal and breath sounds normal. No respiratory distress. She has no wheezes.  Abdominal: Soft. Bowel sounds are normal. There is no tenderness. There is no guarding.  Musculoskeletal: Normal range of motion. She exhibits edema. She exhibits no tenderness.  Able to move all 4 extremities, no assistive device used  Lymphadenopathy:    She has no cervical adenopathy.  Neurological: She is alert and oriented to person, place, and time. No cranial nerve deficit. She exhibits normal muscle tone.  Skin: Skin is warm and dry. No rash noted. She is not diaphoretic.  Psychiatric: She has a normal mood and affect.     Labs reviewed: Basic Metabolic Panel: Recent Labs    05/20/17 2151 05/21/17 0324 05/22/17 0651 10/01/17 0735 02/03/18 0000  NA  --  138 142 139 140  K  --  3.0* 4.4 4.3 4.0  CL  --  108 115* 105 106  CO2  --  _0 GLUCOSE  --  80 102* 111* 138*  BUN  --  _1 CREATININE  --  0.88 0.84 1.16* 1.13*  CALCIUM  --  8.5* 8.7* 9.4 9.5  MG 1.9 1.9 1.7  --   --    Liver Function Tests: Recent Labs    05/20/17 1252 05/20/17 2151 05/21/17 0324 10/01/17 0735 02/03/18 0000  AST 18 19 14* 13 14  ALT 12* 12* 8* 7 7  ALKPHOS 82 85 68  --   --   BILITOT 0.6 0.8 1.0 0.4 0.4  PROT 6.8 7.2 5.6* 6.7 6.9  ALBUMIN 3.9 3.9 3.2*  --   --    No results for input(s): LIPASE, AMYLASE in the last 8760 hours. Recent Labs    05/20/17 1754  AMMONIA 36*   CBC: Recent Labs    05/20/17 1252  05/21/17 0324 05/22/17 0651 10/01/17 0735  WBC 8.8  --  6.6 6.2 8.1  NEUTROABS 7.0  --   --   --  4,739  HGB 13.7  --  12.1 11.5* 13.5  HCT 42.1   < > 37.7 36.7 41.3  MCV 89.0  --  88.9 90.6 85.9  PLT 234  --  221 207 299   < > = values in this interval not displayed.   Cardiac Enzymes: Recent Labs    05/20/17 1754  CKTOTAL 81   BNP: Invalid input(s): POCBNP Lab Results  Component Value Date   HGBA1C 5.8 (H) 02/03/2018   Lab Results  Component Value Date   TSH 1.553 05/20/2017   Lab Results  Component Value Date   VITAMINB12 238 05/20/2017   Lab Results  Component Value Date   FOLATE 14.3 10/03/2011   No results found for: IRON, TIBC, FERRITIN  Lipid Panel: Recent Labs    05/21/17 0324 10/01/17 0735 02/03/18 0000  CHOL 148 212* 181  HDL 53 74 58  LDLCALC 82 117* 109*  TRIG 66 99 59  CHOLHDL 2.8 2.9 3.1   Lab Results  Component Value Date   HGBA1C 5.8 (H) 02/03/2018    Procedures since last visit: No results found.  Assessment/Plan  1. Essential hypertension Continue metoprolol succinate and losartan. Reviewed labs - metoprolol succinate  (TOPROL-XL) 50 MG 24 hr tablet; Take 1 tablet (50 mg total) by mouth daily. Take with or immediately following a meal.  Dispense: 90 tablet; Refill: 3 - CBC with Differential/Platelets; Future - Lipid Panel; Future - CMP with eGFR([facility]); Future - Hemoglobin A1c; Future - TSH; Future  2. Chronic obstructive pulmonary disease, unspecified COPD type ([place]) Continue ipratropium, smoking cessation counseling provided. Script for pneumococcal vaccine 13  3. Peripheral vascular disease ([place]) Supportive care  4. Nonintractable epilepsy without status epilepticus, unspecified epilepsy type ([place]) Continue current regimen  5. Need for pneumococcal vaccine - Pneumococcal conjugate vaccine 13-valent  6. Gastroesophageal reflux disease without esophagitis Does not want medication trial, symptom persists, advised small portion meals, avoiding late night meals and sitting upright post meals for atleast an hour.   7. Prediabetes counselling on diet and exercise - Lipid  Panel; Future - Hemoglobin A1c; Future - TSH; Future  8. Hyperlipidemia LDL goal <130 Does not want statin, counselling on diet and exercise.  - Lipid Panel; Future - TSH; Future  9. CKD (chronic kidney disease) stage 3, GFR 30-59 ml/min (HCC) Monitor renal function, lab reviewed this visit.  - CBC with Differential/Platelets; Future - CMP with eGFR([facility]); Future  10. Episode of recurrent major depressive disorder, unspecified depression episode severity ([place]) Refuses counselling or medical treatment, monitor, refuses PHQ 9 screening.     Labs/tests ordered:   Lab Orders     CBC with Differential/Platelets     Lipid Panel     CMP with eGFR([facility])     Hemoglobin A1c     TSH  Next appointment:  4 months for physical, EKG, PHQ 9  Communication: reviewed care plan with patient    Blanchie Serve, MD Internal Medicine [place], [place] [address] Cell Phone  (Monday-Friday 8 am - 5 pm): [phone] On Call: [phone] and follow prompts after 5 pm and on weekends Office Phone: [phone] Office Fax: [phone]

## 2018-02-23 NOTE — Patient Instructions (Signed)
  I would recommend medication for your acid reflux. Stopping smoking will also help tremendously. Let me know if you agree to try medication for acid reflux.  I have sent a script for pneumococcal vaccine to your pharmacy.

## 2018-02-25 DIAGNOSIS — Z23 Encounter for immunization: Secondary | ICD-10-CM | POA: Diagnosis not present

## 2018-04-10 ENCOUNTER — Other Ambulatory Visit: Payer: Self-pay | Admitting: Internal Medicine

## 2018-04-10 DIAGNOSIS — I10 Essential (primary) hypertension: Secondary | ICD-10-CM

## 2018-06-07 DIAGNOSIS — E785 Hyperlipidemia, unspecified: Secondary | ICD-10-CM | POA: Diagnosis not present

## 2018-06-07 DIAGNOSIS — R7303 Prediabetes: Secondary | ICD-10-CM | POA: Diagnosis not present

## 2018-06-07 DIAGNOSIS — N183 Chronic kidney disease, stage 3 (moderate): Secondary | ICD-10-CM | POA: Diagnosis not present

## 2018-06-07 DIAGNOSIS — I10 Essential (primary) hypertension: Secondary | ICD-10-CM | POA: Diagnosis not present

## 2018-06-08 DIAGNOSIS — R7303 Prediabetes: Secondary | ICD-10-CM

## 2018-06-08 DIAGNOSIS — N183 Chronic kidney disease, stage 3 unspecified: Secondary | ICD-10-CM

## 2018-06-08 DIAGNOSIS — I10 Essential (primary) hypertension: Secondary | ICD-10-CM

## 2018-06-08 DIAGNOSIS — E785 Hyperlipidemia, unspecified: Secondary | ICD-10-CM

## 2018-06-09 LAB — CBC WITH DIFFERENTIAL/PLATELET
BASOS PCT: 0.6 %
Basophils Absolute: 47 cells/uL (ref 0–200)
EOS ABS: 316 {cells}/uL (ref 15–500)
Eosinophils Relative: 4 %
HCT: 42.2 % (ref 35.0–45.0)
Hemoglobin: 14.2 g/dL (ref 11.7–15.5)
Lymphs Abs: 2552 cells/uL (ref 850–3900)
MCH: 29 pg (ref 27.0–33.0)
MCHC: 33.6 g/dL (ref 32.0–36.0)
MCV: 86.1 fL (ref 80.0–100.0)
MPV: 9.8 fL (ref 7.5–12.5)
Monocytes Relative: 9.2 %
NEUTROS ABS: 4258 {cells}/uL (ref 1500–7800)
Neutrophils Relative %: 53.9 %
PLATELETS: 283 10*3/uL (ref 140–400)
RBC: 4.9 10*6/uL (ref 3.80–5.10)
RDW: 14.1 % (ref 11.0–15.0)
TOTAL LYMPHOCYTE: 32.3 %
WBC: 7.9 10*3/uL (ref 3.8–10.8)
WBCMIX: 727 {cells}/uL (ref 200–950)

## 2018-06-09 LAB — COMPLETE METABOLIC PANEL WITH GFR
AG Ratio: 1.6 (calc) (ref 1.0–2.5)
ALKALINE PHOSPHATASE (APISO): 94 U/L (ref 33–130)
ALT: 5 U/L — AB (ref 6–29)
AST: 11 U/L (ref 10–35)
Albumin: 4.2 g/dL (ref 3.6–5.1)
BUN/Creatinine Ratio: 17 (calc) (ref 6–22)
BUN: 18 mg/dL (ref 7–25)
CALCIUM: 9.5 mg/dL (ref 8.6–10.4)
CO2: 28 mmol/L (ref 20–32)
CREATININE: 1.09 mg/dL — AB (ref 0.60–0.88)
Chloride: 107 mmol/L (ref 98–110)
GFR, EST NON AFRICAN AMERICAN: 47 mL/min/{1.73_m2} — AB (ref >=60)
GFR, Est African American: 54 mL/min/{1.73_m2} — ABNORMAL LOW (ref >=60)
GLOBULIN: 2.6 g/dL (ref 1.9–3.7)
GLUCOSE: 103 mg/dL — AB (ref 65–99)
Potassium: 4.1 mmol/L (ref 3.5–5.3)
SODIUM: 141 mmol/L (ref 135–146)
Total Bilirubin: 0.4 mg/dL (ref 0.2–1.2)
Total Protein: 6.8 g/dL (ref 6.1–8.1)

## 2018-06-09 LAB — HEMOGLOBIN A1C
EAG (MMOL/L): 6.5 (calc)
Hgb A1c MFr Bld: 5.7 % of total Hgb — ABNORMAL HIGH (ref <5.7)
Mean Plasma Glucose: 117 (calc)

## 2018-06-09 LAB — TSH: TSH: 2.67 mIU/L (ref 0.40–4.50)

## 2018-06-09 LAB — LIPID PANEL
CHOLESTEROL: 207 mg/dL — AB (ref <200)
HDL: 68 mg/dL (ref >50)
LDL Cholesterol (Calc): 121 mg/dL (calc) — ABNORMAL HIGH
Non-HDL Cholesterol (Calc): 139 mg/dL (calc) — ABNORMAL HIGH (ref <130)
Total CHOL/HDL Ratio: 3 (calc) (ref <5.0)
Triglycerides: 81 mg/dL (ref <150)

## 2018-06-15 ENCOUNTER — Encounter: Payer: Medicare Other | Admitting: Internal Medicine

## 2018-06-17 DIAGNOSIS — Z961 Presence of intraocular lens: Secondary | ICD-10-CM | POA: Diagnosis not present

## 2018-06-17 DIAGNOSIS — H524 Presbyopia: Secondary | ICD-10-CM | POA: Diagnosis not present

## 2018-08-07 ENCOUNTER — Other Ambulatory Visit: Payer: Self-pay | Admitting: Nurse Practitioner

## 2018-08-07 DIAGNOSIS — I10 Essential (primary) hypertension: Secondary | ICD-10-CM

## 2018-08-11 ENCOUNTER — Other Ambulatory Visit: Payer: Self-pay | Admitting: *Deleted

## 2018-08-11 DIAGNOSIS — I10 Essential (primary) hypertension: Secondary | ICD-10-CM

## 2018-08-11 MED ORDER — LOSARTAN POTASSIUM 100 MG PO TABS: 100.0000 mg | ORAL_TABLET | Freq: Every day | ORAL | 0 refills | Status: DC

## 2018-08-11 NOTE — Telephone Encounter (Signed)
Jessica [name] Need appointment before anymore future refills.

## 2018-08-17 ENCOUNTER — Other Ambulatory Visit: Payer: Self-pay

## 2018-08-17 ENCOUNTER — Emergency Department (HOSPITAL_COMMUNITY): Payer: Medicare Other

## 2018-08-17 ENCOUNTER — Emergency Department (HOSPITAL_COMMUNITY)
Admission: EM | Admit: 2018-08-17 | Discharge: 2018-08-17 | Disposition: A | Discharge status: Home / Self Care | Payer: Medicare Other | Attending: Emergency Medicine | Admitting: Emergency Medicine

## 2018-08-17 ENCOUNTER — Encounter (HOSPITAL_COMMUNITY): Payer: Self-pay

## 2018-08-17 DIAGNOSIS — S069X9A Unspecified intracranial injury with loss of consciousness of unspecified duration, initial encounter: Secondary | ICD-10-CM | POA: Diagnosis not present

## 2018-08-17 DIAGNOSIS — S299XXA Unspecified injury of thorax, initial encounter: Secondary | ICD-10-CM | POA: Diagnosis not present

## 2018-08-17 DIAGNOSIS — Y998 Other external cause status: Secondary | ICD-10-CM | POA: Diagnosis not present

## 2018-08-17 DIAGNOSIS — Z85028 Personal history of other malignant neoplasm of stomach: Secondary | ICD-10-CM | POA: Insufficient documentation

## 2018-08-17 DIAGNOSIS — Z79899 Other long term (current) drug therapy: Secondary | ICD-10-CM | POA: Insufficient documentation

## 2018-08-17 DIAGNOSIS — Z85038 Personal history of other malignant neoplasm of large intestine: Secondary | ICD-10-CM | POA: Insufficient documentation

## 2018-08-17 DIAGNOSIS — Z85828 Personal history of other malignant neoplasm of skin: Secondary | ICD-10-CM | POA: Insufficient documentation

## 2018-08-17 DIAGNOSIS — S22070A Wedge compression fracture of T9-T10 vertebra, initial encounter for closed fracture: Secondary | ICD-10-CM | POA: Diagnosis not present

## 2018-08-17 DIAGNOSIS — F329 Major depressive disorder, single episode, unspecified: Secondary | ICD-10-CM | POA: Insufficient documentation

## 2018-08-17 DIAGNOSIS — W06XXXA Fall from bed, initial encounter: Secondary | ICD-10-CM | POA: Insufficient documentation

## 2018-08-17 DIAGNOSIS — F17210 Nicotine dependence, cigarettes, uncomplicated: Secondary | ICD-10-CM | POA: Diagnosis not present

## 2018-08-17 DIAGNOSIS — Y9389 Activity, other specified: Secondary | ICD-10-CM | POA: Insufficient documentation

## 2018-08-17 DIAGNOSIS — F1010 Alcohol abuse, uncomplicated: Secondary | ICD-10-CM | POA: Insufficient documentation

## 2018-08-17 DIAGNOSIS — F419 Anxiety disorder, unspecified: Secondary | ICD-10-CM | POA: Diagnosis not present

## 2018-08-17 DIAGNOSIS — J449 Chronic obstructive pulmonary disease, unspecified: Secondary | ICD-10-CM | POA: Diagnosis not present

## 2018-08-17 DIAGNOSIS — R0689 Other abnormalities of breathing: Secondary | ICD-10-CM | POA: Diagnosis not present

## 2018-08-17 DIAGNOSIS — S51012A Laceration without foreign body of left elbow, initial encounter: Secondary | ICD-10-CM | POA: Diagnosis not present

## 2018-08-17 DIAGNOSIS — G40909 Epilepsy, unspecified, not intractable, without status epilepticus: Secondary | ICD-10-CM | POA: Diagnosis not present

## 2018-08-17 DIAGNOSIS — S0003XA Contusion of scalp, initial encounter: Secondary | ICD-10-CM | POA: Insufficient documentation

## 2018-08-17 DIAGNOSIS — Y92122 Bedroom in nursing home as the place of occurrence of the external cause: Secondary | ICD-10-CM | POA: Diagnosis not present

## 2018-08-17 DIAGNOSIS — N183 Chronic kidney disease, stage 3 (moderate): Secondary | ICD-10-CM | POA: Insufficient documentation

## 2018-08-17 DIAGNOSIS — S22000A Wedge compression fracture of unspecified thoracic vertebra, initial encounter for closed fracture: Secondary | ICD-10-CM | POA: Diagnosis not present

## 2018-08-17 DIAGNOSIS — R569 Unspecified convulsions: Secondary | ICD-10-CM

## 2018-08-17 DIAGNOSIS — R079 Chest pain, unspecified: Secondary | ICD-10-CM | POA: Diagnosis not present

## 2018-08-17 DIAGNOSIS — S0083XA Contusion of other part of head, initial encounter: Secondary | ICD-10-CM | POA: Diagnosis not present

## 2018-08-17 DIAGNOSIS — I129 Hypertensive chronic kidney disease with stage 1 through stage 4 chronic kidney disease, or unspecified chronic kidney disease: Secondary | ICD-10-CM | POA: Insufficient documentation

## 2018-08-17 DIAGNOSIS — S0990XA Unspecified injury of head, initial encounter: Secondary | ICD-10-CM | POA: Diagnosis present

## 2018-08-17 DIAGNOSIS — M546 Pain in thoracic spine: Secondary | ICD-10-CM | POA: Diagnosis not present

## 2018-08-17 DIAGNOSIS — S199XXA Unspecified injury of neck, initial encounter: Secondary | ICD-10-CM | POA: Diagnosis not present

## 2018-08-17 DIAGNOSIS — I10 Essential (primary) hypertension: Secondary | ICD-10-CM | POA: Diagnosis not present

## 2018-08-17 LAB — URINALYSIS, ROUTINE W REFLEX MICROSCOPIC
Bilirubin Urine: NEGATIVE
Glucose, UA: NEGATIVE mg/dL
KETONES UR: 5 mg/dL — AB
LEUKOCYTES UA: NEGATIVE
Nitrite: NEGATIVE
PROTEIN: NEGATIVE mg/dL
Specific Gravity, Urine: 1.01 (ref 1.005–1.030)
pH: 7 (ref 5.0–8.0)

## 2018-08-17 LAB — COMPREHENSIVE METABOLIC PANEL
ALBUMIN: 3.9 g/dL (ref 3.5–5.0)
ALK PHOS: 82 U/L (ref 38–126)
ALT: 16 U/L (ref 0–44)
ANION GAP: 8 (ref 5–15)
AST: 19 U/L (ref 15–41)
BUN: 21 mg/dL (ref 8–23)
CHLORIDE: 108 mmol/L (ref 98–111)
CO2: 25 mmol/L (ref 22–32)
Calcium: 8.6 mg/dL — ABNORMAL LOW (ref 8.9–10.3)
Creatinine, Ser: 0.85 mg/dL (ref 0.44–1.00)
GFR calc Af Amer: >60 mL/min (ref >60)
GFR calc non Af Amer: >60 mL/min (ref >60)
GLUCOSE: 111 mg/dL — AB (ref 70–99)
POTASSIUM: 3.6 mmol/L (ref 3.5–5.1)
SODIUM: 141 mmol/L (ref 135–145)
Total Bilirubin: 0.7 mg/dL (ref 0.3–1.2)
Total Protein: 7.2 g/dL (ref 6.5–8.1)

## 2018-08-17 LAB — CBC
HCT: 41.6 % (ref 36.0–46.0)
HEMOGLOBIN: 13 g/dL (ref 12.0–15.0)
MCH: 29 pg (ref 26.0–34.0)
MCHC: 31.3 g/dL (ref 30.0–36.0)
MCV: 92.9 fL (ref 80.0–100.0)
PLATELETS: 187 10*3/uL (ref 150–400)
RBC: 4.48 MIL/uL (ref 3.87–5.11)
RDW: 13.4 % (ref 11.5–15.5)
WBC: 10.6 10*3/uL — AB (ref 4.0–10.5)
nRBC: 0 % (ref 0.0–0.2)

## 2018-08-17 MED ORDER — SODIUM CHLORIDE 0.9 % IV BOLUS
500.0000 mL | Freq: Once | INTRAVENOUS | Status: AC
  Administered 2018-08-17: 500 mL via INTRAVENOUS

## 2018-08-17 MED ORDER — HYDROGEN PEROXIDE 3 % EX SOLN
CUTANEOUS | Status: AC
  Administered 2018-08-17: 13:00:00
  Filled 2018-08-17: qty 473

## 2018-08-17 MED ORDER — BACITRACIN ZINC 500 UNIT/GM EX OINT
TOPICAL_OINTMENT | Freq: Two times a day (BID) | CUTANEOUS | Status: DC
  Administered 2018-08-17: 1 via TOPICAL
  Filled 2018-08-17: qty 0.9

## 2018-08-17 MED ORDER — ACETAMINOPHEN 325 MG PO TABS
650.0000 mg | ORAL_TABLET | Freq: Once | ORAL | Status: DC
  Filled 2018-08-17: qty 2
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
## 2018-08-17 NOTE — ED Notes (Signed)
ED Provider at bedside. 

## 2018-08-17 NOTE — Discharge Instructions (Addendum)
It was our pleasure to provide your ER care today - we hope that you feel better.  Rest. Drink plenty of fluids. Fall precautions. Icepack to sore area. Keep abrasions very clean.   Continue your seizure medication - make sure not to miss or skip any doses.   The xrays show a T9 compression fracture - take acetaminophen and/or ibuprofen as need for pain.  Follow up with your primary care doctor/neurologist in the next few days for recheck. No driving, swimming or potentially dangerous activity until cleared to do so by your doctor.   Return to ER if worse, new symptoms, fevers, recurrent seizures, trouble breathing, new or severe pain, other concern.

## 2018-08-17 NOTE — ED Provider Notes (Addendum)
[facility] DEPT Provider Note   CSN: [id number] Arrival date & time: 08/17/18  0945     History   Chief Complaint Chief Complaint  Patient presents with  . Altered Mental Status  . Seizures    HPI Jessica [name] is a 84 y.o. female.  Patient with hx seizures, afib, copd, presents s/p fall. Per report, another resident of facility heard pt fall last night. Pt was able to get self back to bed. Staff noted blood on posterior scalp and elbow this AM, and pt appear confused as compared to baseline. Pt c/o pain to posterior scalp and elbow. Unclear if loc. Pt limited historian, confusion - level 5 caveat. Pt unsure if had seizure. No chest pain. No sob. No abd pain.   The history is provided by the EMS personnel and the patient. The history is limited by the condition of the patient.  Altered Mental Status   Associated symptoms include seizures.  Seizures   Associated symptoms include headaches. Pertinent negatives include no vomiting.    Past Medical History:  Diagnosis Date  . Alcohol abuse 11/11/2010  . Alcohol abuse, unspecified   . Anxiety   . Atrial fibrillation ([place]) 11/11/2010  . Bone infection of left hand ([place]) 12/22/2008   secondary to injury  . Cancer ([place])    colon cancer-hx of chemo  . Chronic airway obstruction, not elsewhere classified 10/01/2011  . Confusion   . COPD (chronic obstructive pulmonary disease) ([place])   . Dizziness and giddiness 04/24/2005  . Edema 12/03/2011  . Embolism - blood clot 1996   left breast  . Generalized convulsive epilepsy without mention of intractable epilepsy   . GERD (gastroesophageal reflux disease)   . Hemorrhoids   . Hyperlipidemia   . Hypertension   . Insomnia, unspecified 11/11/2010  . Major depressive disorder, single episode, unspecified 11/11/2010  . Malignant neoplasm of stomach, unspecified site 11/11/2010  . Melanoma ([place])    metastatic  . Peripheral vascular disease ([place])   .  Seizures ([place])   . Symptomatic menopausal or female climacteric states 11/11/2010  . Tachycardia, unspecified   . Tobacco use disorder   . Urgency of urination     Patient Active Problem List   Diagnosis Date Noted  . CKD (chronic kidney disease) stage 3, GFR 30-59 ml/min (HCC) 02/23/2018  . Need for pneumococcal vaccine 02/23/2018  . Prediabetes 02/23/2018  . Malnutrition ([place]) 01/04/2018  . Hyperglycemia 10/06/2017  . Episode of recurrent major depressive disorder ([place]) 10/06/2017  . Nonintractable epilepsy without status epilepticus ([place]) 07/14/2017  . Anemia 05/28/2017  . Loss of weight 01/18/2015  . Osteoarthritis of finger 02/16/2014  . Seizures ([place])   . Peripheral vascular disease ([place])   . Depression with anxiety   . Hyperlipidemia LDL goal <130   . Cancer ([place])   . Confusion   . Alcohol abuse   . Tachycardia   . Generalized convulsive epilepsy ([place])   . GERD (gastroesophageal reflux disease) 02/03/2013  . PVD (peripheral vascular disease) ([place]) 10/03/2011  . Personal history of colon cancer 10/03/2011  . Bronchitis 09/26/2011  . COPD (chronic obstructive pulmonary disease) ([place]) 09/26/2011  . Hypertension 09/26/2011  . Hyperlipemia 09/26/2011  . Tobacco abuse 09/26/2011  . Diarrhea 09/26/2011  . Bone infection of left hand ([place]) 12/22/2008    Past Surgical History:  Procedure Laterality Date  . ABDOMINAL HYSTERECTOMY  1963  . BLADDER REPAIR    . breast biopsies     x2  on left, x 1 on right  . c-sections  [place]   x3  . COLON RESECTION  1995  . COLONOSCOPY  10/21/2012   Dr. Paulita Fujita diverticulosis, sessile polyps biopsy   . heart ablation  1997  . HEMORRHOID SURGERY    . laser surgery to right eye  10/04/2007  . left femoral artery stent  05/07/2004  . melanoma removed from left groin  09/2004  . melanoma removed from left leg  09/06/2004  . orif left humerus fracture  03/27/2005  . porta cath placement  1995  . porta cath removal  1996  . removal  of tumor in left knee  11/15/2010  . skin graft to left leg  12/05/2004  . [place]     OB History   None      Home Medications    Prior to Admission medications   Medication Sig Start Date End Date Taking? Authorizing Provider  ipratropium (ATROVENT HFA) 17 MCG/ACT inhaler Inhale 2 puffs into the lungs every 6 (six) hours as needed for wheezing. 10/06/17   Blanchie Serve, MD  LAMICTAL 150 MG tablet Take 1 tablet (150 mg total) by mouth every morning. 11/12/17   Marcial Pacas, MD  LAMICTAL 200 MG tablet Take 1 tablet (200 mg total) by mouth at bedtime. 11/12/17   Marcial Pacas, MD  losartan (COZAAR) 100 MG tablet Take 1 tablet (100 mg total) by mouth daily. Need an appointment before anymore future refills 08/11/18   Ngetich, Dinah C, NP  metoprolol succinate (TOPROL-XL) 50 MG 24 hr tablet Take 1 tablet (50 mg total) by mouth daily. Take with or immediately following a meal. 02/23/18   Blanchie Serve, MD  zonisamide (ZONEGRAN) 100 MG capsule One in the morning and 2 tabs at night 01/12/18   Marcial Pacas, MD    Family History Family History  Problem Relation Age of Onset  . Cancer Mother   . Breast cancer Sister   . Cancer Brother     Social History Social History   Tobacco Use  . Smoking status: Current Every Day Smoker    Years: 60.00    Types: Cigarettes  . Smokeless tobacco: Never Used  . Tobacco comment: 1 pack last 3 days-10/29/17  Substance Use Topics  . Alcohol use: No    Comment: quit 02-02-2010  . Drug use: No     Allergies   Aspirin; Effexor [venlafaxine hcl]; Keflex [cephalexin]; Venlafaxine; Penicillins; and Zithromax [azithromycin]   Review of Systems Review of Systems  Unable to perform ROS: Mental status change  Constitutional: Negative for fever.  Gastrointestinal: Negative for vomiting.  Neurological: Positive for seizures and headaches.  level 5 caveat - altered mental status.    Physical Exam Updated Vital Signs Ht (S) 1.651 m (5'  5")   Wt (S) 47 kg   SpO2 98%   BMI 17.24 kg/m   Physical Exam  Constitutional: She appears well-developed and well-nourished.  HENT:  Mouth/Throat: Oropharynx is clear and moist.  Posterior scalp contusion - cleaned - no suturable laceration.   Eyes: Pupils are equal, round, and reactive to light. Conjunctivae are normal. No scleral icterus.  Neck: Neck supple. No tracheal deviation present.  Ccollar.   Cardiovascular: Normal rate, regular rhythm, normal heart sounds and intact distal pulses.  Pulmonary/Chest: Effort normal and breath sounds normal. No respiratory distress. She exhibits no tenderness.  Abdominal: Soft. Normal appearance and bowel sounds are normal. She exhibits no distension. There  is no tenderness. There is no guarding.  Genitourinary:  Genitourinary Comments: No cva tenderness.   Musculoskeletal: She exhibits no edema.  Skin tear left elbow. Lower cervical and mid thoracic tenderness, otherwise, CTLS spine, non tender, aligned, no step off. Good rom bil extremities without pain or focal bony tenderness.   Neurological: She is alert.  Awake and alert. Answers most questions with yes or no, limited historian, ?confused. Moves bil extremities purposefully w good strength.   Skin: Skin is warm and dry. No rash noted.  Psychiatric: She has a normal mood and affect.  Nursing note and vitals reviewed.    ED Treatments / Results  Labs (all labs ordered are listed, but only abnormal results are displayed) Results for orders placed or performed during the hospital encounter of 08/17/18  CBC  Result Value Ref Range   WBC 10.6 (H) 4.0 - 10.5 K/uL   RBC 4.48 3.87 - 5.11 MIL/uL   Hemoglobin 13.0 12.0 - 15.0 g/dL   HCT 41.6 36.0 - 46.0 %   MCV 92.9 80.0 - 100.0 fL   MCH 29.0 26.0 - 34.0 pg   MCHC 31.3 30.0 - 36.0 g/dL   RDW 13.4 11.5 - 15.5 %   Platelets 187 150 - 400 K/uL   nRBC 0.0 0.0 - 0.2 %  Comprehensive metabolic panel  Result Value Ref Range   Sodium 141  135 - 145 mmol/L   Potassium 3.6 3.5 - 5.1 mmol/L   Chloride 108 98 - 111 mmol/L   CO2 25 22 - 32 mmol/L   Glucose, Bld 111 (H) 70 - 99 mg/dL   BUN 21 8 - 23 mg/dL   Creatinine, Ser 0.85 0.44 - 1.00 mg/dL   Calcium 8.6 (L) 8.9 - 10.3 mg/dL   Total Protein 7.2 6.5 - 8.1 g/dL   Albumin 3.9 3.5 - 5.0 g/dL   AST 19 15 - 41 U/L   ALT 16 0 - 44 U/L   Alkaline Phosphatase 82 38 - 126 U/L   Total Bilirubin 0.7 0.3 - 1.2 mg/dL   GFR calc non Af Amer >60 >60 mL/min   GFR calc Af Amer >60 >60 mL/min   Anion gap 8 5 - 15  Urinalysis, Routine w reflex microscopic  Result Value Ref Range   Color, Urine YELLOW YELLOW   APPearance CLEAR CLEAR   Specific Gravity, Urine 1.010 1.005 - 1.030   pH 7.0 5.0 - 8.0   Glucose, UA NEGATIVE NEGATIVE mg/dL   Hgb urine dipstick SMALL (A) NEGATIVE   Bilirubin Urine NEGATIVE NEGATIVE   Ketones, ur 5 (A) NEGATIVE mg/dL   Protein, ur NEGATIVE NEGATIVE mg/dL   Nitrite NEGATIVE NEGATIVE   Leukocytes, UA NEGATIVE NEGATIVE   RBC / HPF 0-5 0 - 5 RBC/hpf   WBC, UA 0-5 0 - 5 WBC/hpf   Bacteria, UA RARE (A) NONE SEEN   Squamous Epithelial / LPF 0-5 0 - 5   Dg Chest 2 View  Result Date: 08/17/2018 CLINICAL DATA:  Fall, pain EXAM: CHEST - 2 VIEW COMPARISON:  10/29/2017 FINDINGS: Heart is normal size. Scarring in the upper lobes bilaterally. No confluent opacities, effusions or pneumothorax. No acute bony abnormality. IMPRESSION: No active cardiopulmonary disease. Electronically Signed   By: Rolm Baptise M.D.   On: 08/17/2018 11:50   Dg Thoracic Spine 2 View  Result Date: 08/17/2018 CLINICAL DATA:  [place] last evening. EXAM: THORACIC SPINE 2 VIEWS COMPARISON:  Chest x-ray 10/29/2017 FINDINGS: Normal alignment of the thoracic vertebral  bodies. Mild stable multilevel degenerative changes with disc space narrowing and spurring. Suspect a mild compression fracture of T9 which appears new since the prior lateral chest x-ray from January 2019. Marland Kitchen IMPRESSION: Suspect mild  compression fracture of T9. Normal alignment. Electronically Signed   By: Marijo Sanes M.D.   On: 08/17/2018 12:07   Ct Head Wo Contrast  Result Date: 08/17/2018 CLINICAL DATA:  Fall, hit back of head.  Loss of consciousness. EXAM: CT HEAD WITHOUT CONTRAST CT CERVICAL SPINE WITHOUT CONTRAST TECHNIQUE: Multidetector CT imaging of the head and cervical spine was performed following the standard protocol without intravenous contrast. Multiplanar CT image reconstructions of the cervical spine were also generated. COMPARISON:  CT and MRI 05/20/2017 FINDINGS: CT HEAD FINDINGS Brain: There is atrophy and chronic small vessel disease changes. No acute intracranial abnormality. Specifically, no hemorrhage, hydrocephalus, mass lesion, acute infarction, or significant intracranial injury. Image quality is degraded by patient motion. Vascular: No hyperdense vessel or unexpected calcification. Skull: No acute calvarial abnormality. Sinuses/Orbits: Visualized paranasal sinuses and mastoids clear. Orbital soft tissues unremarkable. Other: None CT CERVICAL SPINE FINDINGS Alignment: 3 mm of anterolisthesis of C4 on C5 related to facet disease. Skull base and vertebrae: No acute fracture. No primary bone lesion or focal pathologic process. Soft tissues and spinal canal: No prevertebral fluid or swelling. No visible canal hematoma. Disc levels: Severe diffuse degenerative disc and facet disease throughout the cervical spine. Upper chest: Biapical scarring. Other: Carotid artery calcifications. IMPRESSION: No acute intracranial abnormality. Atrophy, chronic microvascular disease. Diffuse degenerative disc and facet disease throughout the cervical spine. No acute bony abnormality. Electronically Signed   By: Rolm Baptise M.D.   On: 08/17/2018 11:43   Ct Cervical Spine Wo Contrast  Result Date: 08/17/2018 CLINICAL DATA:  Fall, hit back of head.  Loss of consciousness. EXAM: CT HEAD WITHOUT CONTRAST CT CERVICAL SPINE WITHOUT  CONTRAST TECHNIQUE: Multidetector CT imaging of the head and cervical spine was performed following the standard protocol without intravenous contrast. Multiplanar CT image reconstructions of the cervical spine were also generated. COMPARISON:  CT and MRI 05/20/2017 FINDINGS: CT HEAD FINDINGS Brain: There is atrophy and chronic small vessel disease changes. No acute intracranial abnormality. Specifically, no hemorrhage, hydrocephalus, mass lesion, acute infarction, or significant intracranial injury. Image quality is degraded by patient motion. Vascular: No hyperdense vessel or unexpected calcification. Skull: No acute calvarial abnormality. Sinuses/Orbits: Visualized paranasal sinuses and mastoids clear. Orbital soft tissues unremarkable. Other: None CT CERVICAL SPINE FINDINGS Alignment: 3 mm of anterolisthesis of C4 on C5 related to facet disease. Skull base and vertebrae: No acute fracture. No primary bone lesion or focal pathologic process. Soft tissues and spinal canal: No prevertebral fluid or swelling. No visible canal hematoma. Disc levels: Severe diffuse degenerative disc and facet disease throughout the cervical spine. Upper chest: Biapical scarring. Other: Carotid artery calcifications. IMPRESSION: No acute intracranial abnormality. Atrophy, chronic microvascular disease. Diffuse degenerative disc and facet disease throughout the cervical spine. No acute bony abnormality. Electronically Signed   By: Rolm Baptise M.D.   On: 08/17/2018 11:43    EKG None  Radiology Dg Chest 2 View  Result Date: 08/17/2018 CLINICAL DATA:  Fall, pain EXAM: CHEST - 2 VIEW COMPARISON:  10/29/2017 FINDINGS: Heart is normal size. Scarring in the upper lobes bilaterally. No confluent opacities, effusions or pneumothorax. No acute bony abnormality. IMPRESSION: No active cardiopulmonary disease. Electronically Signed   By: Rolm Baptise M.D.   On: 08/17/2018 11:50   Dg Thoracic Spine 2  View  Result Date:  08/17/2018 CLINICAL DATA:  [place] last evening. EXAM: THORACIC SPINE 2 VIEWS COMPARISON:  Chest x-ray 10/29/2017 FINDINGS: Normal alignment of the thoracic vertebral bodies. Mild stable multilevel degenerative changes with disc space narrowing and spurring. Suspect a mild compression fracture of T9 which appears new since the prior lateral chest x-ray from January 2019. Marland Kitchen IMPRESSION: Suspect mild compression fracture of T9. Normal alignment. Electronically Signed   By: Marijo Sanes M.D.   On: 08/17/2018 12:07   Ct Head Wo Contrast  Result Date: 08/17/2018 CLINICAL DATA:  Fall, hit back of head.  Loss of consciousness. EXAM: CT HEAD WITHOUT CONTRAST CT CERVICAL SPINE WITHOUT CONTRAST TECHNIQUE: Multidetector CT imaging of the head and cervical spine was performed following the standard protocol without intravenous contrast. Multiplanar CT image reconstructions of the cervical spine were also generated. COMPARISON:  CT and MRI 05/20/2017 FINDINGS: CT HEAD FINDINGS Brain: There is atrophy and chronic small vessel disease changes. No acute intracranial abnormality. Specifically, no hemorrhage, hydrocephalus, mass lesion, acute infarction, or significant intracranial injury. Image quality is degraded by patient motion. Vascular: No hyperdense vessel or unexpected calcification. Skull: No acute calvarial abnormality. Sinuses/Orbits: Visualized paranasal sinuses and mastoids clear. Orbital soft tissues unremarkable. Other: None CT CERVICAL SPINE FINDINGS Alignment: 3 mm of anterolisthesis of C4 on C5 related to facet disease. Skull base and vertebrae: No acute fracture. No primary bone lesion or focal pathologic process. Soft tissues and spinal canal: No prevertebral fluid or swelling. No visible canal hematoma. Disc levels: Severe diffuse degenerative disc and facet disease throughout the cervical spine. Upper chest: Biapical scarring. Other: Carotid artery calcifications. IMPRESSION: No acute intracranial  abnormality. Atrophy, chronic microvascular disease. Diffuse degenerative disc and facet disease throughout the cervical spine. No acute bony abnormality. Electronically Signed   By: Rolm Baptise M.D.   On: 08/17/2018 11:43   Ct Cervical Spine Wo Contrast  Result Date: 08/17/2018 CLINICAL DATA:  Fall, hit back of head.  Loss of consciousness. EXAM: CT HEAD WITHOUT CONTRAST CT CERVICAL SPINE WITHOUT CONTRAST TECHNIQUE: Multidetector CT imaging of the head and cervical spine was performed following the standard protocol without intravenous contrast. Multiplanar CT image reconstructions of the cervical spine were also generated. COMPARISON:  CT and MRI 05/20/2017 FINDINGS: CT HEAD FINDINGS Brain: There is atrophy and chronic small vessel disease changes. No acute intracranial abnormality. Specifically, no hemorrhage, hydrocephalus, mass lesion, acute infarction, or significant intracranial injury. Image quality is degraded by patient motion. Vascular: No hyperdense vessel or unexpected calcification. Skull: No acute calvarial abnormality. Sinuses/Orbits: Visualized paranasal sinuses and mastoids clear. Orbital soft tissues unremarkable. Other: None CT CERVICAL SPINE FINDINGS Alignment: 3 mm of anterolisthesis of C4 on C5 related to facet disease. Skull base and vertebrae: No acute fracture. No primary bone lesion or focal pathologic process. Soft tissues and spinal canal: No prevertebral fluid or swelling. No visible canal hematoma. Disc levels: Severe diffuse degenerative disc and facet disease throughout the cervical spine. Upper chest: Biapical scarring. Other: Carotid artery calcifications. IMPRESSION: No acute intracranial abnormality. Atrophy, chronic microvascular disease. Diffuse degenerative disc and facet disease throughout the cervical spine. No acute bony abnormality. Electronically Signed   By: Rolm Baptise M.D.   On: 08/17/2018 11:43    Procedures Procedures (including critical care  time)  Medications Ordered in ED Medications  sodium chloride 0.9 % bolus 500 mL (has no administration in time range)     Initial Impression / Assessment and Plan / ED Course  I have  reviewed the triage vital signs and the nursing notes.  Pertinent labs & imaging results that were available during my care of the patient were reviewed by me and considered in my medical decision making (see chart for details).  Iv ns. Continuous pulse ox and monitor. Seizure precautions.   CT. Labs.   Reviewed nursing notes and prior charts for additional history.   Labs reviewed -  Chem normal.  xrays reviewed - t9 compression fx, otherwise neg acute. Recheck  c spine, non tender,aligned.  Acetaminophen po.   Pt tolerating po fluids. Ambulated in ED by staff.  Family present - she indicates pt at baseline, and has remained at baseline throughout ED stay - pt/family indicate patient ready for d/c, requesting d/c.     Final Clinical Impressions(s) / ED Diagnoses   Final diagnoses:  None    ED Discharge Orders    None            Lajean Saver, MD 08/17/18 1609

## 2018-08-17 NOTE — ED Notes (Signed)
Patient transported to CT 

## 2018-08-17 NOTE — ED Notes (Signed)
Bed: WA09 Expected date:  Expected time:  Means of arrival:  Comments: EMS-SZ/AMS

## 2018-08-17 NOTE — ED Triage Notes (Signed)
Patient is complaining of Cervical and Thoracic pain. [facility] has taken precautions.

## 2018-08-18 ENCOUNTER — Telehealth: Payer: Self-pay | Admitting: *Deleted

## 2018-08-18 NOTE — Telephone Encounter (Signed)
Called patient to schedule her hospital follow-up appointment, she stated that she was still a little weak and couldn't make down to the clinic without some assistance. We scheduled her for 09/02/2018 @ 1:00 pm, I advised her to give me a call if she needed to be seen sooner.

## 2018-09-02 ENCOUNTER — Non-Acute Institutional Stay: Payer: Medicare Other | Admitting: Nurse Practitioner

## 2018-09-02 DIAGNOSIS — S22070A Wedge compression fracture of T9-T10 vertebra, initial encounter for closed fracture: Secondary | ICD-10-CM | POA: Diagnosis not present

## 2018-09-02 DIAGNOSIS — Z720 Tobacco use: Secondary | ICD-10-CM

## 2018-09-02 DIAGNOSIS — G40909 Epilepsy, unspecified, not intractable, without status epilepticus: Secondary | ICD-10-CM | POA: Diagnosis not present

## 2018-09-02 DIAGNOSIS — J449 Chronic obstructive pulmonary disease, unspecified: Secondary | ICD-10-CM | POA: Diagnosis not present

## 2018-09-02 DIAGNOSIS — I10 Essential (primary) hypertension: Secondary | ICD-10-CM

## 2018-09-02 DIAGNOSIS — K219 Gastro-esophageal reflux disease without esophagitis: Secondary | ICD-10-CM | POA: Diagnosis not present

## 2018-09-02 DIAGNOSIS — R000 Tachycardia, unspecified: Secondary | ICD-10-CM

## 2018-09-02 DIAGNOSIS — I739 Peripheral vascular disease, unspecified: Secondary | ICD-10-CM

## 2018-09-02 NOTE — Assessment & Plan Note (Addendum)
Continue to encourage smoking cessation. A pack every 2 days.

## 2018-09-02 NOTE — Assessment & Plan Note (Signed)
Hx of afib, heart rate is in control, continue Metoprolol succinate 50mg  qd.

## 2018-09-02 NOTE — Assessment & Plan Note (Signed)
Stable, continue prn Ipratropium 2puffs q6h prn. Encourage smoking cessation.

## 2018-09-02 NOTE — Assessment & Plan Note (Addendum)
08/17/18 X-ray Thoracic spine T9 mild compression fracture. PT, brace may help, ortho is no better. Prn Tylenol.

## 2018-09-02 NOTE — Assessment & Plan Note (Signed)
Stable

## 2018-09-02 NOTE — Patient Instructions (Addendum)
PT to evaluate and treat as indicated for mid back pain. F/u in clinic 6 months.

## 2018-09-02 NOTE — Assessment & Plan Note (Signed)
Stable, chronic trace edema BLE. No open wounds.

## 2018-09-02 NOTE — Assessment & Plan Note (Signed)
Continue f/u neurology, continue Zonisamide 200mg  qd, Lamictal 150mg  qd, 200mg  qd .

## 2018-09-02 NOTE — Progress Notes (Signed)
Location:   clinic [place]   Place of Service:  Clinic (12) Provider: Marlana Latus NP  Code Status: DNR Goals of Care: [place] 08/28/2017  Does Patient Have a Medical Advance Directive? Yes  Type of Paramedic of [place];Out of facility DNR (pink MOST or yellow form)  Does patient want to make changes to medical advance directive? No - Patient declined  Copy of [place] in Chart? Yes  Pre-existing out of facility DNR order (yellow form or pink MOST form) Yellow form placed in chart (order not valid for inpatient use);Pink MOST form placed in chart (order not valid for inpatient use)     Chief Complaint  Patient presents with  . Hospitalization Follow-up    HPI: Patient is a 84 y.o. female seen today for f/u ED evaluation 08/17/18 for seizures and AMS. Her headache is resolved, left elbow skin tear, lower cervical and mid thoracic tenderness area improving. . ED UA negative for UTI, Na 141, K 3.6, Bun 21, creat 0.85, wbc 10.6, Hgb 13.0, plt 187. She is on Zonisamide 200mg  qd, Lamictal 200mg  qd, 150mg  qd, f/u neurology  Hx of Afib, heart rate is in control, on Metoprolol 50mg  qd, COPD, stable on Ipratropium 2 puffs q6h prn, chronic somker,  TSH 2.67 06/07/18, Hx of HTN, blood pressure is controlled on Losartan 100mg  qd, Metoprolol 50mg  qd.   Past Medical History:  Diagnosis Date  . Alcohol abuse 11/11/2010  . Alcohol abuse, unspecified   . Anxiety   . Atrial fibrillation ([place]) 11/11/2010  . Bone infection of left hand ([place]) 12/22/2008   secondary to injury  . Cancer ([place])    colon cancer-hx of chemo  . Chronic airway obstruction, not elsewhere classified 10/01/2011  . Confusion   . COPD (chronic obstructive pulmonary disease) ([place])   . Dizziness and giddiness 04/24/2005  . Edema 12/03/2011  . Embolism - blood clot 1996   left breast  . Generalized convulsive epilepsy without mention of intractable epilepsy   . GERD  (gastroesophageal reflux disease)   . Hemorrhoids   . Hyperlipidemia   . Hypertension   . Insomnia, unspecified 11/11/2010  . Major depressive disorder, single episode, unspecified 11/11/2010  . Malignant neoplasm of stomach, unspecified site 11/11/2010  . Melanoma ([place])    metastatic  . Peripheral vascular disease ([place])   . Seizures ([place])   . Symptomatic menopausal or female climacteric states 11/11/2010  . Tachycardia, unspecified   . Tobacco use disorder   . Urgency of urination     Past Surgical History:  Procedure Laterality Date  . ABDOMINAL HYSTERECTOMY  1963  . BLADDER REPAIR    . breast biopsies     x2 on left, x 1 on right  . c-sections  1957, 1959, 1961   x3  . COLON RESECTION  1995  . COLONOSCOPY  10/21/2012   Dr. Paulita Fujita diverticulosis, sessile polyps biopsy   . heart ablation  1997  . HEMORRHOID SURGERY    . laser surgery to right eye  10/04/2007  . left femoral artery stent  05/07/2004  . melanoma removed from left groin  09/2004  . melanoma removed from left leg  09/06/2004  . orif left humerus fracture  03/27/2005  . porta cath placement  1995  . porta cath removal  1996  . removal of tumor in left knee  11/15/2010  . skin graft to left leg  12/05/2004  . [place]  Allergies  Allergen Reactions  . Aspirin Nausea And Vomiting  . Effexor [Venlafaxine Hcl]     Seizure  . Keflex [Cephalexin]   . Venlafaxine Other (See Comments)    seizures  . Penicillins Rash    Has patient had a PCN reaction causing immediate rash, facial/tongue/throat swelling, SOB or lightheadedness with hypotension: Yes Has patient had a PCN reaction causing severe rash involving mucus membranes or skin necrosis: No Has patient had a PCN reaction that required hospitalization: No Has patient had a PCN reaction occurring within the last 10 years: No If all of the above answers are "NO", then may proceed with Cephalosporin use.   . Zithromax [Azithromycin] Rash     Allergies as of 09/02/2018      Reactions   Aspirin Nausea And Vomiting   Effexor [venlafaxine Hcl]    Seizure   Keflex [cephalexin]    Venlafaxine Other (See Comments)   seizures   Penicillins Rash   Has patient had a PCN reaction causing immediate rash, facial/tongue/throat swelling, SOB or lightheadedness with hypotension: Yes Has patient had a PCN reaction causing severe rash involving mucus membranes or skin necrosis: No Has patient had a PCN reaction that required hospitalization: No Has patient had a PCN reaction occurring within the last 10 years: No If all of the above answers are "NO", then may proceed with Cephalosporin use.   Zithromax [azithromycin] Rash      Medication List        Accurate as of 09/02/18 11:59 PM. Always use your most recent med list.          ipratropium 17 MCG/ACT inhaler Commonly known as:  ATROVENT HFA Inhale 2 puffs into the lungs every 6 (six) hours as needed for wheezing.   LAMICTAL 150 MG tablet Generic drug:  lamoTRIgine Take 1 tablet (150 mg total) by mouth every morning.   LAMICTAL 200 MG tablet Generic drug:  lamoTRIgine Take 1 tablet (200 mg total) by mouth at bedtime.   losartan 100 MG tablet Commonly known as:  COZAAR Take 1 tablet (100 mg total) by mouth daily. Need an appointment before anymore future refills   metoprolol succinate 50 MG 24 hr tablet Commonly known as:  TOPROL-XL Take 1 tablet (50 mg total) by mouth daily. Take with or immediately following a meal.   zonisamide 100 MG capsule Commonly known as:  ZONEGRAN One in the morning and 2 tabs at night       Review of Systems:  Review of Systems  Constitutional: Negative for activity change, appetite change, chills, diaphoresis, fatigue and fever.  HENT: Positive for hearing loss. Negative for congestion and voice change.   Respiratory: Positive for cough. Negative for shortness of breath and wheezing.        Chronic cough due to COPD and GERD   Cardiovascular: Positive for leg swelling. Negative for chest pain and palpitations.  Gastrointestinal: Negative for abdominal distention, abdominal pain, constipation, diarrhea, nausea and vomiting.  Genitourinary: Negative for difficulty urinating, dysuria and urgency.  Musculoskeletal: Positive for arthralgias, back pain and gait problem.       Mid back pain since the fall 08/27/18  Skin: Negative for color change and pallor.       Occiput scabbed over. Left elbow skin tear is healing, no s/s of infection.   Neurological: Negative for dizziness, speech difficulty, weakness and headaches.  Psychiatric/Behavioral: Positive for dysphoric mood. Negative for behavioral problems and sleep disturbance. The patient is not nervous/anxious.  Health Maintenance  Topic Date Due  . FOOT EXAM  05/28/1942  . OPHTHALMOLOGY EXAM  05/28/1942  . TETANUS/TDAP  05/28/1953  . INFLUENZA VACCINE  04/29/2018  . HEMOGLOBIN A1C  12/06/2018  . PNA vac Low Risk Adult (2 of 2 - PPSV23) 02/26/2019  . DEXA SCAN  Completed    Physical Exam: Vitals:   09/02/18 1312  BP: 122/72  Pulse: 73  Resp: 18  Temp: 97.9 F (36.6 C)  SpO2: 94%  Weight: 102 lb 12.8 oz (46.6 kg)  Height: 5\' 5"  (1.651 m)   Body mass index is 17.11 kg/m. Physical Exam  Constitutional: She is oriented to person, place, and time. She appears well-developed and well-nourished.  HENT:  Head: Normocephalic and atraumatic.  Eyes: Pupils are equal, round, and reactive to light. EOM are normal.  Neck: Normal range of motion. Neck supple. No JVD present. No thyromegaly present.  Cardiovascular: Normal rate and regular rhythm.  No murmur heard. Pulmonary/Chest: Effort normal. She has no wheezes. She has rales.  Bibasilar rales.   Abdominal: Soft. She exhibits no distension. There is no tenderness. There is no rebound and no guarding.  Musculoskeletal: She exhibits edema.  Trace edema BLE  Neurological: She is alert and oriented to  person, place, and time. No cranial nerve deficit. She exhibits normal muscle tone. Coordination normal.  Skin: Skin is warm and dry.  Occiput scabbed over. Left elbow skin tear is healing nicely, no s/s of infection.  Psychiatric: She has a normal mood and affect. Her behavior is normal. Judgment and thought content normal.    Labs reviewed: Basic Metabolic Panel: Recent Labs    02/03/18 0000 06/07/18 0000 08/17/18 1007  NA 140 141 141  K 4.0 4.1 3.6  CL 106 107 108  CO2 28 28 25   GLUCOSE 138* 103* 111*  BUN 19 18 21   CREATININE 1.13* 1.09* 0.85  CALCIUM 9.5 9.5 8.6*  TSH  --  2.67  --    Liver Function Tests: Recent Labs    02/03/18 0000 06/07/18 0000 08/17/18 1007  AST 14 11 19   ALT 7 5* 16  ALKPHOS  --   --  82  BILITOT 0.4 0.4 0.7  PROT 6.9 6.8 7.2  ALBUMIN  --   --  3.9   No results for input(s): LIPASE, AMYLASE in the last 8760 hours. No results for input(s): AMMONIA in the last 8760 hours. CBC: Recent Labs    10/01/17 0735 06/07/18 0000 08/17/18 1007  WBC 8.1 7.9 10.6*  NEUTROABS 4,739 4,258  --   HGB 13.5 14.2 13.0  HCT 41.3 42.2 41.6  MCV 85.9 86.1 92.9  PLT 299 283 187   Lipid Panel: Recent Labs    10/01/17 0735 02/03/18 0000 06/07/18 0000  CHOL 212* 181 207*  HDL 74 58 68  LDLCALC 117* 109* 121*  TRIG 99 59 81  CHOLHDL 2.9 3.1 3.0   Lab Results  Component Value Date   HGBA1C 5.7 (H) 06/07/2018    Procedures since last visit: Dg Chest 2 View  Result Date: 08/17/2018 CLINICAL DATA:  Fall, pain EXAM: CHEST - 2 VIEW COMPARISON:  10/29/2017 FINDINGS: Heart is normal size. Scarring in the upper lobes bilaterally. No confluent opacities, effusions or pneumothorax. No acute bony abnormality. IMPRESSION: No active cardiopulmonary disease. Electronically Signed   By: Rolm Baptise M.D.   On: 08/17/2018 11:50   Dg Thoracic Spine 2 View  Result Date: 08/17/2018 CLINICAL DATA:  [place] last evening. EXAM:  THORACIC SPINE 2 VIEWS COMPARISON:   Chest x-ray 10/29/2017 FINDINGS: Normal alignment of the thoracic vertebral bodies. Mild stable multilevel degenerative changes with disc space narrowing and spurring. Suspect a mild compression fracture of T9 which appears new since the prior lateral chest x-ray from January 2019. Marland Kitchen IMPRESSION: Suspect mild compression fracture of T9. Normal alignment. Electronically Signed   By: Marijo Sanes M.D.   On: 08/17/2018 12:07   Ct Head Wo Contrast  Result Date: 08/17/2018 CLINICAL DATA:  Fall, hit back of head.  Loss of consciousness. EXAM: CT HEAD WITHOUT CONTRAST CT CERVICAL SPINE WITHOUT CONTRAST TECHNIQUE: Multidetector CT imaging of the head and cervical spine was performed following the standard protocol without intravenous contrast. Multiplanar CT image reconstructions of the cervical spine were also generated. COMPARISON:  CT and MRI 05/20/2017 FINDINGS: CT HEAD FINDINGS Brain: There is atrophy and chronic small vessel disease changes. No acute intracranial abnormality. Specifically, no hemorrhage, hydrocephalus, mass lesion, acute infarction, or significant intracranial injury. Image quality is degraded by patient motion. Vascular: No hyperdense vessel or unexpected calcification. Skull: No acute calvarial abnormality. Sinuses/Orbits: Visualized paranasal sinuses and mastoids clear. Orbital soft tissues unremarkable. Other: None CT CERVICAL SPINE FINDINGS Alignment: 3 mm of anterolisthesis of C4 on C5 related to facet disease. Skull base and vertebrae: No acute fracture. No primary bone lesion or focal pathologic process. Soft tissues and spinal canal: No prevertebral fluid or swelling. No visible canal hematoma. Disc levels: Severe diffuse degenerative disc and facet disease throughout the cervical spine. Upper chest: Biapical scarring. Other: Carotid artery calcifications. IMPRESSION: No acute intracranial abnormality. Atrophy, chronic microvascular disease. Diffuse degenerative disc and facet disease  throughout the cervical spine. No acute bony abnormality. Electronically Signed   By: Rolm Baptise M.D.   On: 08/17/2018 11:43   Ct Cervical Spine Wo Contrast  Result Date: 08/17/2018 CLINICAL DATA:  Fall, hit back of head.  Loss of consciousness. EXAM: CT HEAD WITHOUT CONTRAST CT CERVICAL SPINE WITHOUT CONTRAST TECHNIQUE: Multidetector CT imaging of the head and cervical spine was performed following the standard protocol without intravenous contrast. Multiplanar CT image reconstructions of the cervical spine were also generated. COMPARISON:  CT and MRI 05/20/2017 FINDINGS: CT HEAD FINDINGS Brain: There is atrophy and chronic small vessel disease changes. No acute intracranial abnormality. Specifically, no hemorrhage, hydrocephalus, mass lesion, acute infarction, or significant intracranial injury. Image quality is degraded by patient motion. Vascular: No hyperdense vessel or unexpected calcification. Skull: No acute calvarial abnormality. Sinuses/Orbits: Visualized paranasal sinuses and mastoids clear. Orbital soft tissues unremarkable. Other: None CT CERVICAL SPINE FINDINGS Alignment: 3 mm of anterolisthesis of C4 on C5 related to facet disease. Skull base and vertebrae: No acute fracture. No primary bone lesion or focal pathologic process. Soft tissues and spinal canal: No prevertebral fluid or swelling. No visible canal hematoma. Disc levels: Severe diffuse degenerative disc and facet disease throughout the cervical spine. Upper chest: Biapical scarring. Other: Carotid artery calcifications. IMPRESSION: No acute intracranial abnormality. Atrophy, chronic microvascular disease. Diffuse degenerative disc and facet disease throughout the cervical spine. No acute bony abnormality. Electronically Signed   By: Rolm Baptise M.D.   On: 08/17/2018 11:43    Assessment/Plan Hypertension Blood pressure is in control, continue Metoprolol succinate 50mg  qd, Losartan 100mg  qd.   Peripheral vascular disease  (HCC) Stable, chronic trace edema BLE. No open wounds.   COPD (chronic obstructive pulmonary disease) (HCC) Stable, continue prn Ipratropium 2puffs q6h prn. Encourage smoking cessation.   Nonintractable epilepsy without status epilepticus (  [place]) Continue f/u neurology, continue Zonisamide 200mg  qd, Lamictal 150mg  qd, 200mg  qd .  Tobacco abuse Continue to encourage smoking cessation. A pack every 2 days.   Tachycardia Hx of afib, heart rate is in control, continue Metoprolol succinate 50mg  qd.   Compression fracture of T9 vertebra (HCC) 08/17/18 X-ray Thoracic spine T9 mild compression fracture. PT, brace may help, ortho is no better. Prn Tylenol.    GERD (gastroesophageal reflux disease) Stable.     Labs/tests ordered:  none  Next appt:  6 months

## 2018-09-02 NOTE — Assessment & Plan Note (Signed)
Blood pressure is in control, continue Metoprolol succinate 50mg  qd, Losartan 100mg  qd.

## 2018-09-03 ENCOUNTER — Encounter: Payer: Self-pay | Admitting: Nurse Practitioner

## 2018-11-08 ENCOUNTER — Other Ambulatory Visit: Payer: Self-pay | Admitting: Family

## 2018-11-08 DIAGNOSIS — I10 Essential (primary) hypertension: Secondary | ICD-10-CM

## 2018-11-12 ENCOUNTER — Other Ambulatory Visit: Payer: Self-pay | Admitting: Family

## 2018-11-12 DIAGNOSIS — I10 Essential (primary) hypertension: Secondary | ICD-10-CM

## 2018-11-28 ENCOUNTER — Emergency Department (HOSPITAL_COMMUNITY)
Admission: EM | Admit: 2018-11-28 | Discharge: 2018-11-28 | Disposition: A | Discharge status: Skilled Nursing Facility | Payer: Medicare Other | Attending: Emergency Medicine | Admitting: Emergency Medicine

## 2018-11-28 ENCOUNTER — Emergency Department (HOSPITAL_COMMUNITY): Payer: Medicare Other

## 2018-11-28 ENCOUNTER — Other Ambulatory Visit: Payer: Self-pay

## 2018-11-28 ENCOUNTER — Encounter (HOSPITAL_COMMUNITY): Payer: Self-pay

## 2018-11-28 DIAGNOSIS — E860 Dehydration: Secondary | ICD-10-CM | POA: Diagnosis not present

## 2018-11-28 DIAGNOSIS — R05 Cough: Secondary | ICD-10-CM | POA: Diagnosis not present

## 2018-11-28 DIAGNOSIS — Z9221 Personal history of antineoplastic chemotherapy: Secondary | ICD-10-CM | POA: Diagnosis not present

## 2018-11-28 DIAGNOSIS — R531 Weakness: Secondary | ICD-10-CM | POA: Diagnosis not present

## 2018-11-28 DIAGNOSIS — Z79899 Other long term (current) drug therapy: Secondary | ICD-10-CM | POA: Insufficient documentation

## 2018-11-28 DIAGNOSIS — Z880 Allergy status to penicillin: Secondary | ICD-10-CM | POA: Diagnosis not present

## 2018-11-28 DIAGNOSIS — J449 Chronic obstructive pulmonary disease, unspecified: Secondary | ICD-10-CM | POA: Diagnosis not present

## 2018-11-28 DIAGNOSIS — E876 Hypokalemia: Secondary | ICD-10-CM | POA: Insufficient documentation

## 2018-11-28 DIAGNOSIS — I10 Essential (primary) hypertension: Secondary | ICD-10-CM | POA: Diagnosis not present

## 2018-11-28 DIAGNOSIS — F17210 Nicotine dependence, cigarettes, uncomplicated: Secondary | ICD-10-CM | POA: Insufficient documentation

## 2018-11-28 DIAGNOSIS — N183 Chronic kidney disease, stage 3 (moderate): Secondary | ICD-10-CM | POA: Diagnosis not present

## 2018-11-28 DIAGNOSIS — Z85038 Personal history of other malignant neoplasm of large intestine: Secondary | ICD-10-CM | POA: Diagnosis not present

## 2018-11-28 DIAGNOSIS — M2550 Pain in unspecified joint: Secondary | ICD-10-CM | POA: Diagnosis not present

## 2018-11-28 DIAGNOSIS — R4182 Altered mental status, unspecified: Secondary | ICD-10-CM | POA: Diagnosis not present

## 2018-11-28 DIAGNOSIS — Z7401 Bed confinement status: Secondary | ICD-10-CM | POA: Diagnosis not present

## 2018-11-28 DIAGNOSIS — R4020 Unspecified coma: Secondary | ICD-10-CM | POA: Diagnosis not present

## 2018-11-28 DIAGNOSIS — R0902 Hypoxemia: Secondary | ICD-10-CM | POA: Diagnosis not present

## 2018-11-28 DIAGNOSIS — I129 Hypertensive chronic kidney disease with stage 1 through stage 4 chronic kidney disease, or unspecified chronic kidney disease: Secondary | ICD-10-CM | POA: Diagnosis not present

## 2018-11-28 LAB — CBC WITH DIFFERENTIAL/PLATELET
Abs Immature Granulocytes: 0.05 10*3/uL (ref 0.00–0.07)
BASOS PCT: 0 %
Basophils Absolute: 0 10*3/uL (ref 0.0–0.1)
EOS ABS: 0.1 10*3/uL (ref 0.0–0.5)
EOS PCT: 1 %
HCT: 42.7 % (ref 36.0–46.0)
HEMOGLOBIN: 13.1 g/dL (ref 12.0–15.0)
Immature Granulocytes: 1 %
Lymphocytes Relative: 12 %
Lymphs Abs: 0.8 10*3/uL (ref 0.7–4.0)
MCH: 28.4 pg (ref 26.0–34.0)
MCHC: 30.7 g/dL (ref 30.0–36.0)
MCV: 92.4 fL (ref 80.0–100.0)
MONO ABS: 0.4 10*3/uL (ref 0.1–1.0)
Monocytes Relative: 6 %
NRBC: 0 % (ref 0.0–0.2)
Neutro Abs: 5.3 10*3/uL (ref 1.7–7.7)
Neutrophils Relative %: 80 %
Platelets: 256 10*3/uL (ref 150–400)
RBC: 4.62 MIL/uL (ref 3.87–5.11)
RDW: 13.7 % (ref 11.5–15.5)
WBC: 6.6 10*3/uL (ref 4.0–10.5)

## 2018-11-28 LAB — LACTIC ACID, PLASMA: Lactic Acid, Venous: 1.2 mmol/L (ref 0.5–1.9)

## 2018-11-28 LAB — URINALYSIS, ROUTINE W REFLEX MICROSCOPIC
BACTERIA UA: NONE SEEN
BILIRUBIN URINE: NEGATIVE
GLUCOSE, UA: NEGATIVE mg/dL
Hgb urine dipstick: NEGATIVE
KETONES UR: NEGATIVE mg/dL
NITRITE: NEGATIVE
PH: 6 (ref 5.0–8.0)
Protein, ur: 100 mg/dL — AB
Specific Gravity, Urine: 1.02 (ref 1.005–1.030)

## 2018-11-28 LAB — COMPREHENSIVE METABOLIC PANEL
ALK PHOS: 81 U/L (ref 38–126)
ALT: 9 U/L (ref 0–44)
ANION GAP: 8 (ref 5–15)
AST: 13 U/L — ABNORMAL LOW (ref 15–41)
Albumin: 3.8 g/dL (ref 3.5–5.0)
BUN: 23 mg/dL (ref 8–23)
CALCIUM: 9 mg/dL (ref 8.9–10.3)
CO2: 27 mmol/L (ref 22–32)
Chloride: 105 mmol/L (ref 98–111)
Creatinine, Ser: 1.23 mg/dL — ABNORMAL HIGH (ref 0.44–1.00)
GFR calc non Af Amer: 40 mL/min — ABNORMAL LOW (ref >60)
GFR, EST AFRICAN AMERICAN: 47 mL/min — AB (ref >60)
Glucose, Bld: 113 mg/dL — ABNORMAL HIGH (ref 70–99)
Potassium: 2.8 mmol/L — ABNORMAL LOW (ref 3.5–5.1)
SODIUM: 140 mmol/L (ref 135–145)
Total Bilirubin: 0.5 mg/dL (ref 0.3–1.2)
Total Protein: 7.2 g/dL (ref 6.5–8.1)

## 2018-11-28 LAB — CBG MONITORING, ED: Glucose-Capillary: 102 mg/dL — ABNORMAL HIGH (ref 70–99)

## 2018-11-28 LAB — MAGNESIUM: Magnesium: 1.3 mg/dL — ABNORMAL LOW (ref 1.7–2.4)

## 2018-11-28 MED ORDER — POTASSIUM CHLORIDE CRYS ER 20 MEQ PO TBCR
40.0000 meq | EXTENDED_RELEASE_TABLET | Freq: Once | ORAL | Status: AC
  Administered 2018-11-28: 40 meq via ORAL
  Filled 2018-11-28: qty 2

## 2018-11-28 MED ORDER — MAGNESIUM SULFATE 2 GM/50ML IV SOLN
2.0000 g | Freq: Once | INTRAVENOUS | Status: AC
  Administered 2018-11-28: 2 g via INTRAVENOUS
  Filled 2018-11-28: qty 50

## 2018-11-28 MED ORDER — SODIUM CHLORIDE 0.9 % IV BOLUS
500.0000 mL | Freq: Once | INTRAVENOUS | Status: AC
  Administered 2018-11-28: 500 mL via INTRAVENOUS

## 2018-11-28 MED ORDER — POTASSIUM CHLORIDE ER 20 MEQ PO TBCR: 20.0000 meq | EXTENDED_RELEASE_TABLET | Freq: Every day | ORAL | 0 refills | Status: DC

## 2018-11-28 MED ORDER — MAGNESIUM OXIDE -MG SUPPLEMENT 400 (240 MG) MG PO TABS: 1.0000 | ORAL_TABLET | Freq: Every day | ORAL | 0 refills | Status: DC

## 2018-11-28 NOTE — ED Triage Notes (Signed)
Patient arrived from [facility]. Patient is AOx4 and ambulatory with assistance. Facility called [phone] due to staff stating that patient is altered. However EMS assessed patient and patient is AOx4 and only complaint is dehydration and lethargy. Patient has no other complaints.

## 2018-11-28 NOTE — ED Notes (Signed)
Bed: TW65 Expected date:  Expected time:  Means of arrival:  Comments: 85 yo dehydration, lethargy

## 2018-11-28 NOTE — ED Provider Notes (Signed)
[facility] DEPT Provider Note   CSN: [id number] Arrival date & time: 11/28/18  1010    History   Chief Complaint Chief Complaint  Patient presents with  . Dehydration    HPI Jessica [name] is a 85 y.o. female.     Pt presents to the ED today with [facility].  She is a resident of assisted living and they called EMS saying that she was not acting herself.  She has not had a fever.  She denies any problems other than saying she may be dehydrated.  She has had a cough, but that is chronic for her due to COPD.      Past Medical History:  Diagnosis Date  . Alcohol abuse 11/11/2010  . Alcohol abuse, unspecified   . Anxiety   . Atrial fibrillation ([place]) 11/11/2010  . Bone infection of left hand ([place]) 12/22/2008   secondary to injury  . Cancer ([place])    colon cancer-hx of chemo  . Chronic airway obstruction, not elsewhere classified 10/01/2011  . Confusion   . COPD (chronic obstructive pulmonary disease) ([place])   . Dizziness and giddiness 04/24/2005  . Edema 12/03/2011  . Embolism - blood clot 1996   left breast  . Generalized convulsive epilepsy without mention of intractable epilepsy   . GERD (gastroesophageal reflux disease)   . Hemorrhoids   . Hyperlipidemia   . Hypertension   . Insomnia, unspecified 11/11/2010  . Major depressive disorder, single episode, unspecified 11/11/2010  . Malignant neoplasm of stomach, unspecified site 11/11/2010  . Melanoma ([place])    metastatic  . Peripheral vascular disease ([place])   . Seizures ([place])   . Symptomatic menopausal or female climacteric states 11/11/2010  . Tachycardia, unspecified   . Tobacco use disorder   . Urgency of urination     Patient Active Problem List   Diagnosis Date Noted  . Compression fracture of T9 vertebra ([place]) 09/02/2018  . CKD (chronic kidney disease) stage 3, GFR 30-59 ml/min (HCC) 02/23/2018  . Need for pneumococcal vaccine 02/23/2018  . Prediabetes 02/23/2018  . Malnutrition  ([place]) 01/04/2018  . Hyperglycemia 10/06/2017  . Episode of recurrent major depressive disorder ([place]) 10/06/2017  . Nonintractable epilepsy without status epilepticus ([place]) 07/14/2017  . Anemia 05/28/2017  . Loss of weight 01/18/2015  . Osteoarthritis of finger 02/16/2014  . Seizures ([place])   . Peripheral vascular disease ([place])   . Depression with anxiety   . Hyperlipidemia LDL goal <130   . Cancer ([place])   . Confusion   . Alcohol abuse   . Tachycardia   . Generalized convulsive epilepsy ([place])   . GERD (gastroesophageal reflux disease) 02/03/2013  . Personal history of colon cancer 10/03/2011  . Bronchitis 09/26/2011  . COPD (chronic obstructive pulmonary disease) ([place]) 09/26/2011  . Hypertension 09/26/2011  . Hyperlipemia 09/26/2011  . Tobacco abuse 09/26/2011  . Diarrhea 09/26/2011  . Bone infection of left hand ([place]) 12/22/2008    Past Surgical History:  Procedure Laterality Date  . ABDOMINAL HYSTERECTOMY  1963  . BLADDER REPAIR    . breast biopsies     x2 on left, x 1 on right  . c-sections  1957, 1959, 1961   x3  . COLON RESECTION  1995  . COLONOSCOPY  10/21/2012   Dr. Paulita Fujita diverticulosis, sessile polyps biopsy   . heart ablation  1997  . HEMORRHOID SURGERY    . laser surgery to right eye  10/04/2007  . left femoral artery stent  05/07/2004  . melanoma removed from left groin  09/2004  . melanoma removed from left leg  09/06/2004  . orif left humerus fracture  03/27/2005  . porta cath placement  1995  . porta cath removal  1996  . removal of tumor in left knee  11/15/2010  . skin graft to left leg  12/05/2004  . [place]     OB History   No obstetric history on file.      Home Medications    Prior to Admission medications   Medication Sig Start Date End Date Taking? Authorizing Provider  ipratropium (ATROVENT HFA) 17 MCG/ACT inhaler Inhale 2 puffs into the lungs every 6 (six) hours as needed for wheezing. 10/06/17   Blanchie Serve, MD    LAMICTAL 150 MG tablet Take 1 tablet (150 mg total) by mouth every morning. 11/12/17   Marcial Pacas, MD  LAMICTAL 200 MG tablet Take 1 tablet (200 mg total) by mouth at bedtime. 11/12/17   Marcial Pacas, MD  losartan (COZAAR) 100 MG tablet TAKE ONE TABLET BY MOUTH DAILY **MUST CALL MD FOR APPOINTMENT 11/12/18   Mast, Man X, NP  Magnesium Oxide 400 (240 Mg) MG TABS Take 1 tablet (400 mg total) by mouth daily. 11/28/18   Isla Pence, MD  metoprolol succinate (TOPROL-XL) 50 MG 24 hr tablet Take 1 tablet (50 mg total) by mouth daily. Take with or immediately following a meal. 02/23/18   Blanchie Serve, MD  potassium chloride 20 MEQ TBCR Take 20 mEq by mouth daily. 11/28/18   Isla Pence, MD  zonisamide (ZONEGRAN) 100 MG capsule One in the morning and 2 tabs at night 01/12/18   Marcial Pacas, MD    Family History Family History  Problem Relation Age of Onset  . Cancer Mother   . Breast cancer Sister   . Cancer Brother     Social History Social History   Tobacco Use  . Smoking status: Current Every Day Smoker    Years: 60.00    Types: Cigarettes  . Smokeless tobacco: Never Used  . Tobacco comment: 1 pack last 3 days-10/29/17  Substance Use Topics  . Alcohol use: No    Comment: quit 02-02-2010  . Drug use: No     Allergies   Aspirin; Effexor [venlafaxine hcl]; Keflex [cephalexin]; Venlafaxine; Penicillins; and Zithromax [azithromycin]   Review of Systems Review of Systems  Respiratory: Positive for cough.   Genitourinary: Positive for decreased urine volume.  All other systems reviewed and are negative.    Physical Exam Updated Vital Signs BP (!) 159/82   Pulse 64   Temp (S) 97.7 F (36.5 C) (Rectal)   Resp 19   Ht 5\' 5"  (1.651 m)   Wt 46 kg   SpO2 96%   BMI 16.88 kg/m   Physical Exam Vitals signs and nursing note reviewed.  Constitutional:      Appearance: Normal appearance. She is underweight.  HENT:     Head: Normocephalic and atraumatic.     Right Ear: External  ear normal.     Left Ear: External ear normal.     Nose: Nose normal.     Mouth/Throat:     Mouth: Mucous membranes are moist.  Eyes:     Extraocular Movements: Extraocular movements intact.     Pupils: Pupils are equal, round, and reactive to light.  Neck:     Musculoskeletal: Normal range of motion and neck supple.  Cardiovascular:  Rate and Rhythm: Normal rate and regular rhythm.     Pulses: Normal pulses.     Heart sounds: Normal heart sounds.  Pulmonary:     Effort: Pulmonary effort is normal.     Breath sounds: Wheezing present.  Abdominal:     General: Abdomen is flat.     Palpations: Abdomen is soft.  Musculoskeletal: Normal range of motion.  Skin:    General: Skin is warm.     Capillary Refill: Capillary refill takes less than 2 seconds.  Neurological:     General: No focal deficit present.     Mental Status: She is alert and oriented to person, place, and time.  Psychiatric:        Mood and Affect: Mood normal.        Behavior: Behavior normal.      ED Treatments / Results  Labs (all labs ordered are listed, but only abnormal results are displayed) Labs Reviewed  COMPREHENSIVE METABOLIC PANEL - Abnormal; Notable for the following components:      Result Value   Potassium 2.8 (*)    Glucose, Bld 113 (*)    Creatinine, Ser 1.23 (*)    AST 13 (*)    GFR calc non Af Amer 40 (*)    GFR calc Af Amer 47 (*)    All other components within normal limits  URINALYSIS, ROUTINE W REFLEX MICROSCOPIC - Abnormal; Notable for the following components:   APPearance HAZY (*)    Protein, ur 100 (*)    Leukocytes,Ua TRACE (*)    All other components within normal limits  MAGNESIUM - Abnormal; Notable for the following components:   Magnesium 1.3 (*)    All other components within normal limits  CBG MONITORING, ED - Abnormal; Notable for the following components:   Glucose-Capillary 102 (*)    All other components within normal limits  CULTURE, BLOOD (ROUTINE X 2)   CULTURE, BLOOD (ROUTINE X 2)  CBC WITH DIFFERENTIAL/PLATELET  LACTIC ACID, PLASMA  LACTIC ACID, PLASMA    EKG None  Radiology Dg Chest 2 View  Result Date: 11/28/2018 CLINICAL DATA:  Altered mental status. EXAM: CHEST - 2 VIEW COMPARISON:  08/17/2018 and prior chest radiographs FINDINGS: UPPER limits normal heart size and biapical pleuroparenchymal scarring again noted. There is no evidence of focal airspace disease, pulmonary edema, suspicious pulmonary nodule/mass, pleural effusion, or pneumothorax. Increased compression of a midthoracic vertebra (probably T8) is now 50% and a new or increased 50% compression fracture of what appears to be L1 is noted from 08/17/2018. Screws within the proximal LEFT humerus and remote proximal RIGHT humeral fracture again noted. IMPRESSION: Since 08/17/2018, increased compression of a midthoracic vertebra (probably T8) and new or increased compression fracture (50%) of what appears to be L1. No evidence of acute cardiopulmonary disease. Electronically Signed   By: Margarette Canada M.D.   On: 11/28/2018 11:59   Ct Head Wo Contrast  Result Date: 11/28/2018 CLINICAL DATA:  85 year old female with altered level of consciousness. EXAM: CT HEAD WITHOUT CONTRAST TECHNIQUE: Contiguous axial images were obtained from the base of the skull through the vertex without intravenous contrast. COMPARISON:  08/17/2018 CT and prior studies FINDINGS: Brain: No evidence of acute infarction, hemorrhage, hydrocephalus, extra-axial collection or mass lesion/mass effect. Atrophy and chronic small-vessel white matter ischemic changes again noted. Vascular: Carotid atherosclerotic calcifications noted. Skull: Normal. Negative for fracture or focal lesion. Sinuses/Orbits: No acute finding. Other: None. IMPRESSION: 1. No evidence of acute intracranial abnormality. 2.  Atrophy and chronic small-vessel white matter ischemic changes. Electronically Signed   By: Margarette Canada M.D.   On: 11/28/2018 11:35     Procedures Procedures (including critical care time)  Medications Ordered in ED Medications  magnesium sulfate IVPB 2 g 50 mL (2 g Intravenous New Bag/Given 11/28/18 1246)  sodium chloride 0.9 % bolus 500 mL (0 mLs Intravenous Stopped 11/28/18 1247)  potassium chloride SA (K-DUR,KLOR-CON) CR tablet 40 mEq (40 mEq Oral Given 11/28/18 1247)     Initial Impression / Assessment and Plan / ED Course  I have reviewed the triage vital signs and the nursing notes.  Pertinent labs & imaging results that were available during my care of the patient were reviewed by me and considered in my medical decision making (see chart for details).       Pt is hypokalemic and hypomagnesemic.  K and Mg were replaced.  Work up otherwise unremarkable.   Pt given IVFs.  She is feeling better.  There has not been anyone else to give any kind of hx.  The pt appears to be oriented.  Pt is stable for d/c.  Return if worse.  Final Clinical Impressions(s) / ED Diagnoses   Final diagnoses:  Dehydration  Hypokalemia  Hypomagnesemia    ED Discharge Orders         Ordered    potassium chloride 20 MEQ TBCR  Daily     11/28/18 1304    Magnesium Oxide 400 (240 Mg) MG TABS  Daily     11/28/18 1304           Isla Pence, MD 11/28/18 1307

## 2018-12-03 LAB — CULTURE, BLOOD (ROUTINE X 2)
CULTURE: NO GROWTH
CULTURE: NO GROWTH
Special Requests: ADEQUATE

## 2018-12-09 ENCOUNTER — Non-Acute Institutional Stay: Payer: Medicare Other | Admitting: Nurse Practitioner

## 2018-12-09 ENCOUNTER — Encounter: Payer: Self-pay | Admitting: Nurse Practitioner

## 2018-12-09 ENCOUNTER — Other Ambulatory Visit: Payer: Self-pay

## 2018-12-09 DIAGNOSIS — G40309 Generalized idiopathic epilepsy and epileptic syndromes, not intractable, without status epilepticus: Secondary | ICD-10-CM | POA: Diagnosis not present

## 2018-12-09 DIAGNOSIS — I10 Essential (primary) hypertension: Secondary | ICD-10-CM | POA: Diagnosis not present

## 2018-12-09 DIAGNOSIS — K219 Gastro-esophageal reflux disease without esophagitis: Secondary | ICD-10-CM | POA: Diagnosis not present

## 2018-12-09 DIAGNOSIS — H8110 Benign paroxysmal vertigo, unspecified ear: Secondary | ICD-10-CM | POA: Diagnosis not present

## 2018-12-09 DIAGNOSIS — E876 Hypokalemia: Secondary | ICD-10-CM

## 2018-12-09 NOTE — Progress Notes (Signed)
Location:   clinic [facility]   Place of Service:   clinic [place] Provider: Marlana Latus NP  Code Status: DNR Goals of Care: [place] 08/28/2017  Does Patient Have a Medical Advance Directive? Yes  Type of Paramedic of [place];Out of facility DNR (pink MOST or yellow form)  Does patient want to make changes to medical advance directive? No - Patient declined  Copy of [place] in Chart? Yes  Pre-existing out of facility DNR order (yellow form or pink MOST form) Yellow form placed in chart (order not valid for inpatient use);Pink MOST form placed in chart (order not valid for inpatient use)     Chief Complaint  Patient presents with  . Hospitalization Follow-up    Low potassium, fall    HPI: Patient is a 85 y.o. female seen today for medical management of chronic diseases.    ED eval 11/28/18 fall, she stated her BPPV is acting up, found serum K 2.8, Kcl 34mq po qd started. CT head, EKG, CXR, CBC unremarkable.     The patient has history of seizure, stable on Zonisamide 1039mqd, Lamictal 20057mhs 150m51mm. HTN, blood pressure is controlled on Metoprolol 50mg32m Losartan 100mg 42mHypokalemia, on Kcl 20meq 66mlast serum K 2.8 11/28/18. She denied constipation, palpitation, fatigue, muscle weakness or spasm, tingling or numbness.    Past Medical History:  Diagnosis Date  . Alcohol abuse 11/11/2010  . Alcohol abuse, unspecified   . Anxiety   . Atrial fibrillation (HCC) 2/[place]2012  . Bone infection of left hand (HCC) 3/[place]2010   secondary to injury  . Cancer (HCC)   [place] cancer-hx of chemo  . Chronic airway obstruction, not elsewhere classified 10/01/2011  . Confusion   . COPD (chronic obstructive pulmonary disease) (HCC)   [place]iness and giddiness 04/24/2005  . Edema 12/03/2011  . Embolism - blood clot 1996   left breast  . Generalized convulsive epilepsy without mention of intractable epilepsy   . GERD (gastroesophageal  reflux disease)   . Hemorrhoids   . Hyperlipidemia   . Hypertension   . Insomnia, unspecified 11/11/2010  . Major depressive disorder, single episode, unspecified 11/11/2010  . Malignant neoplasm of stomach, unspecified site 11/11/2010  . Melanoma (HCC)   [place]  . Peripheral vascular disease (HCC)   [place] (HCC)   [place] menopausal or female climacteric states 11/11/2010  . Tachycardia, unspecified   . Tobacco use disorder   . Urgency of urination     Past Surgical History:  Procedure Laterality Date  . ABDOMINAL HYSTERECTOMY  1963  . BLADDER REPAIR    . breast biopsies     x2 on left, x 1 on right  . c-sections  1957, 1959, 1961   x3  . COLON RESECTION  1995  . COLONOSCOPY  10/21/2012   Dr. Outlaw Paulita Fujitaiculosis, sessile polyps biopsy   . heart ablation  1997  . HEMORRHOID SURGERY    . laser surgery to right eye  10/04/2007  . left femoral artery stent  05/07/2004  . melanoma removed from left groin  09/2004  . melanoma removed from left leg  09/06/2004  . orif left humerus fracture  03/27/2005  . porta cath placement  1995  . porta cath removal  1996  . removal of tumor in left knee  11/15/2010  . skin graft to left leg  12/05/2004  . [place]  Allergies  Allergen Reactions  . Aspirin Nausea And Vomiting  . Effexor [Venlafaxine Hcl]     Seizure  . Keflex [Cephalexin]   . Venlafaxine Other (See Comments)    seizures  . Penicillins Rash    Has patient had a PCN reaction causing immediate rash, facial/tongue/throat swelling, SOB or lightheadedness with hypotension: Yes Has patient had a PCN reaction causing severe rash involving mucus membranes or skin necrosis: No Has patient had a PCN reaction that required hospitalization: No Has patient had a PCN reaction occurring within the last 10 years: No If all of the above answers are "NO", then may proceed with Cephalosporin use.   . Zithromax [Azithromycin] Rash    Allergies as of  12/09/2018      Reactions   Aspirin Nausea And Vomiting   Effexor [venlafaxine Hcl]    Seizure   Keflex [cephalexin]    Venlafaxine Other (See Comments)   seizures   Penicillins Rash   Has patient had a PCN reaction causing immediate rash, facial/tongue/throat swelling, SOB or lightheadedness with hypotension: Yes Has patient had a PCN reaction causing severe rash involving mucus membranes or skin necrosis: No Has patient had a PCN reaction that required hospitalization: No Has patient had a PCN reaction occurring within the last 10 years: No If all of the above answers are "NO", then may proceed with Cephalosporin use.   Zithromax [azithromycin] Rash      Medication List       Accurate as of December 09, 2018 11:59 PM. Always use your most recent med list.        ipratropium 17 MCG/ACT inhaler Commonly known as:  ATROVENT HFA Inhale 2 puffs into the lungs every 6 (six) hours as needed for wheezing.   LaMICtal 150 MG tablet Generic drug:  lamoTRIgine Take 1 tablet (150 mg total) by mouth every morning.   LaMICtal 200 MG tablet Generic drug:  lamoTRIgine Take 1 tablet (200 mg total) by mouth at bedtime.   losartan 100 MG tablet Commonly known as:  COZAAR TAKE ONE TABLET BY MOUTH DAILY **MUST CALL MD FOR APPOINTMENT   Magnesium Oxide 400 (240 Mg) MG Tabs Take 1 tablet (400 mg total) by mouth daily.   metoprolol succinate 50 MG 24 hr tablet Commonly known as:  TOPROL-XL Take 1 tablet (50 mg total) by mouth daily. Take with or immediately following a meal.   Potassium Chloride ER 20 MEQ Tbcr Take 20 mEq by mouth daily.   zonisamide 100 MG capsule Commonly known as:  ZONEGRAN One in the morning and 2 tabs at night       Review of Systems:  Review of Systems  Constitutional: Negative for activity change, appetite change, chills, diaphoresis, fatigue and fever.  HENT: Positive for hearing loss. Negative for congestion and voice change.   Respiratory: Positive for  cough. Negative for shortness of breath and wheezing.   Cardiovascular: Negative for chest pain, palpitations and leg swelling.  Gastrointestinal: Negative for abdominal distention, abdominal pain, constipation, diarrhea, nausea and vomiting.       GERD  Genitourinary: Negative for difficulty urinating, dysuria and urgency.  Musculoskeletal: Negative for arthralgias, back pain and gait problem.  Skin: Negative for color change.       Resolving bruise right forehead, ecchymosis right forehead, bridge of nose, under left eye.   Neurological: Negative for dizziness, tremors, seizures, facial asymmetry, speech difficulty, weakness, light-headedness and headaches.       Hx of BPPV  Psychiatric/Behavioral: Positive  for dysphoric mood. Negative for agitation, behavioral problems, confusion, hallucinations and sleep disturbance. The patient is not nervous/anxious.     Health Maintenance  Topic Date Due  . FOOT EXAM  05/28/1942  . OPHTHALMOLOGY EXAM  05/28/1942  . TETANUS/TDAP  05/28/1953  . INFLUENZA VACCINE  04/29/2018  . HEMOGLOBIN A1C  12/06/2018  . PNA vac Low Risk Adult (2 of 2 - PPSV23) 02/26/2019  . DEXA SCAN  Completed    Physical Exam: Vitals:   12/09/18 1500  BP: 122/70  Pulse: 89  Resp: 18  Temp: 97.6 F (36.4 C)  TempSrc: Oral  SpO2: 92%  Weight: 94 lb 12.8 oz (43 kg)  Height: _0  (1.651 m)   Body mass index is 15.78 kg/m. Physical Exam Constitutional:      General: She is not in acute distress.    Appearance: Normal appearance. She is not ill-appearing, toxic-appearing or diaphoretic.  HENT:     Head: Normocephalic and atraumatic.     Nose: Nose normal.     Mouth/Throat:     Mouth: Mucous membranes are moist.  Eyes:     Extraocular Movements: Extraocular movements intact.     Pupils: Pupils are equal, round, and reactive to light.  Neck:     Musculoskeletal: Normal range of motion and neck supple.  Cardiovascular:     Rate and Rhythm: Normal rate and  regular rhythm.     Heart sounds: No murmur.  Pulmonary:     Breath sounds: No wheezing, rhonchi or rales.     Comments: Decreased air entry to both lungs.  Abdominal:     General: There is no distension.     Palpations: Abdomen is soft.     Tenderness: There is no abdominal tenderness. There is no guarding or rebound.  Musculoskeletal: Normal range of motion.     Right lower leg: Edema present.     Left lower leg: Edema present.     Comments: Trace edema BLE, chronic, R>L  Skin:    General: Skin is warm and dry.     Findings: Bruising present.     Comments: Resolving bruise right forehead, ecchymosis right forehead, bridge of nose, under left eye.    Neurological:     General: No focal deficit present.     Mental Status: She is alert and oriented to person, place, and time. Mental status is at baseline.     Motor: No weakness.     Coordination: Coordination normal.     Gait: Gait normal.  Psychiatric:        Mood and Affect: Mood normal.        Behavior: Behavior normal.        Thought Content: Thought content normal.        Judgment: Judgment normal.     Labs reviewed: Basic Metabolic Panel: Recent Labs    06/07/18 0000 08/17/18 1007 11/28/18 1019 11/28/18 1130  NA 141 141 140  --   K 4.1 3.6 2.8*  --   CL 107 108 105  --   CO2 _1 --   GLUCOSE 103* 111* 113*  --   BUN _2 --   CREATININE 1.09* 0.85 1.23*  --   CALCIUM 9.5 8.6* 9.0  --   MG  --   --   --  1.3*  TSH 2.67  --   --   --    Liver Function Tests: Recent Labs    06/07/18  0000 08/17/18 1007 11/28/18 1019  AST 11 19 13*  ALT 5* 16 9  ALKPHOS  --  82 81  BILITOT 0.4 0.7 0.5  PROT 6.8 7.2 7.2  ALBUMIN  --  3.9 3.8   No results for input(s): LIPASE, AMYLASE in the last 8760 hours. No results for input(s): AMMONIA in the last 8760 hours. CBC: Recent Labs    06/07/18 0000 08/17/18 1007 11/28/18 1019  WBC 7.9 10.6* 6.6  NEUTROABS 4,258  --  5.3  HGB 14.2 13.0 13.1  HCT 42.2  41.6 42.7  MCV 86.1 92.9 92.4  PLT 283 187 256   Lipid Panel: Recent Labs    02/03/18 0000 06/07/18 0000  CHOL 181 207*  HDL 58 68  LDLCALC 109* 121*  TRIG 59 81  CHOLHDL 3.1 3.0   Lab Results  Component Value Date   HGBA1C 5.7 (H) 06/07/2018    Procedures since last visit: Dg Chest 2 View  Result Date: 11/28/2018 CLINICAL DATA:  Altered mental status. EXAM: CHEST - 2 VIEW COMPARISON:  08/17/2018 and prior chest radiographs FINDINGS: UPPER limits normal heart size and biapical pleuroparenchymal scarring again noted. There is no evidence of focal airspace disease, pulmonary edema, suspicious pulmonary nodule/mass, pleural effusion, or pneumothorax. Increased compression of a midthoracic vertebra (probably T8) is now 50% and a new or increased 50% compression fracture of what appears to be L1 is noted from 08/17/2018. Screws within the proximal LEFT humerus and remote proximal RIGHT humeral fracture again noted. IMPRESSION: Since 08/17/2018, increased compression of a midthoracic vertebra (probably T8) and new or increased compression fracture (50%) of what appears to be L1. No evidence of acute cardiopulmonary disease. Electronically Signed   By: Margarette Canada M.D.   On: 11/28/2018 11:59   Ct Head Wo Contrast  Result Date: 11/28/2018 CLINICAL DATA:  85 year old female with altered level of consciousness. EXAM: CT HEAD WITHOUT CONTRAST TECHNIQUE: Contiguous axial images were obtained from the base of the skull through the vertex without intravenous contrast. COMPARISON:  08/17/2018 CT and prior studies FINDINGS: Brain: No evidence of acute infarction, hemorrhage, hydrocephalus, extra-axial collection or mass lesion/mass effect. Atrophy and chronic small-vessel white matter ischemic changes again noted. Vascular: Carotid atherosclerotic calcifications noted. Skull: Normal. Negative for fracture or focal lesion. Sinuses/Orbits: No acute finding. Other: None. IMPRESSION: 1. No evidence of acute  intracranial abnormality. 2. Atrophy and chronic small-vessel white matter ischemic changes. Electronically Signed   By: Margarette Canada M.D.   On: 11/28/2018 11:35    Assessment/Plan  Generalized convulsive epilepsy ([place]) Stable, continue Zonisamide 120m qd, Lamictal 2065mqhs 15021mam.  Hypertension blood pressure is controlled, continue  Metoprolol 35m64m, Losartan 100mg68m  Hypokalemia Continue Kcl 20meq3m last serum K 2.8 11/28/18. Update CMP/eGFR  GERD (gastroesophageal reflux disease) Resume Nexcium 40mg p20m.   BPPV (benign paroxysmal positional vertigo) She stated she underwent neurology evaluation in the past, declined PT treatment. Resolved since 11/28/18 fall.    Labs/tests ordered: CMP/eGFR  Next appt:  03/03/2019

## 2018-12-09 NOTE — Assessment & Plan Note (Signed)
Resume Nexcium 40mg  po qd.

## 2018-12-09 NOTE — Assessment & Plan Note (Signed)
blood pressure is controlled, continue  Metoprolol 50mg  qd, Losartan 100mg  qd.

## 2018-12-09 NOTE — Assessment & Plan Note (Signed)
She stated she underwent neurology evaluation in the past, declined PT treatment. Resolved since 11/28/18 fall.

## 2018-12-09 NOTE — Assessment & Plan Note (Addendum)
Continue Kcl 31mq qd, last serum K 2.8 11/28/18. Update CMP/eGFR

## 2018-12-09 NOTE — Patient Instructions (Signed)
CMP/eGFR in week. Continue Kcl 41mq po qd.

## 2018-12-09 NOTE — Assessment & Plan Note (Signed)
Stable, continue Zonisamide 100mg  qd, Lamictal 200mg  qhs 150mg  qam.

## 2018-12-10 ENCOUNTER — Encounter: Payer: Self-pay | Admitting: Nurse Practitioner

## 2018-12-13 DIAGNOSIS — E876 Hypokalemia: Secondary | ICD-10-CM | POA: Diagnosis not present

## 2018-12-14 ENCOUNTER — Other Ambulatory Visit: Payer: Medicare Other

## 2018-12-14 ENCOUNTER — Other Ambulatory Visit: Payer: Self-pay

## 2018-12-14 DIAGNOSIS — E876 Hypokalemia: Secondary | ICD-10-CM

## 2018-12-14 LAB — COMPLETE METABOLIC PANEL WITH GFR
AG Ratio: 1.5 (calc) (ref 1.0–2.5)
ALT: 7 U/L (ref 6–29)
AST: 14 U/L (ref 10–35)
Albumin: 4.1 g/dL (ref 3.6–5.1)
Alkaline phosphatase (APISO): 92 U/L (ref 37–153)
BUN/Creatinine Ratio: 17 (calc) (ref 6–22)
BUN: 20 mg/dL (ref 7–25)
CO2: 25 mmol/L (ref 20–32)
Calcium: 9.6 mg/dL (ref 8.6–10.4)
Chloride: 107 mmol/L (ref 98–110)
Creat: 1.18 mg/dL — ABNORMAL HIGH (ref 0.60–0.88)
GFR, Est African American: 49 mL/min/{1.73_m2} — ABNORMAL LOW (ref >=60)
GFR, Est Non African American: 42 mL/min/{1.73_m2} — ABNORMAL LOW (ref >=60)
Globulin: 2.8 g/dL (calc) (ref 1.9–3.7)
Glucose, Bld: 104 mg/dL — ABNORMAL HIGH (ref 65–99)
Potassium: 4.7 mmol/L (ref 3.5–5.3)
Sodium: 139 mmol/L (ref 135–146)
Total Bilirubin: 0.3 mg/dL (ref 0.2–1.2)
Total Protein: 6.9 g/dL (ref 6.1–8.1)

## 2018-12-14 LAB — EXTRA LAV TOP TUBE

## 2018-12-15 ENCOUNTER — Other Ambulatory Visit: Payer: Self-pay | Admitting: *Deleted

## 2018-12-15 DIAGNOSIS — M79605 Pain in left leg: Secondary | ICD-10-CM | POA: Diagnosis not present

## 2018-12-15 DIAGNOSIS — M6281 Muscle weakness (generalized): Secondary | ICD-10-CM | POA: Diagnosis not present

## 2018-12-15 DIAGNOSIS — R1312 Dysphagia, oropharyngeal phase: Secondary | ICD-10-CM | POA: Diagnosis not present

## 2018-12-15 DIAGNOSIS — R2681 Unsteadiness on feet: Secondary | ICD-10-CM | POA: Diagnosis not present

## 2018-12-15 DIAGNOSIS — R296 Repeated falls: Secondary | ICD-10-CM | POA: Diagnosis not present

## 2018-12-15 DIAGNOSIS — R41841 Cognitive communication deficit: Secondary | ICD-10-CM | POA: Diagnosis not present

## 2018-12-15 MED ORDER — LAMICTAL 200 MG PO TABS: 200.0000 mg | ORAL_TABLET | Freq: Every day | ORAL | 0 refills | Status: DC

## 2018-12-15 MED ORDER — LAMICTAL 150 MG PO TABS: 150.0000 mg | ORAL_TABLET | ORAL | 0 refills | Status: DC

## 2018-12-16 DIAGNOSIS — M79605 Pain in left leg: Secondary | ICD-10-CM | POA: Diagnosis not present

## 2018-12-16 DIAGNOSIS — R41841 Cognitive communication deficit: Secondary | ICD-10-CM | POA: Diagnosis not present

## 2018-12-16 DIAGNOSIS — R1312 Dysphagia, oropharyngeal phase: Secondary | ICD-10-CM | POA: Diagnosis not present

## 2018-12-16 DIAGNOSIS — R296 Repeated falls: Secondary | ICD-10-CM | POA: Diagnosis not present

## 2018-12-16 DIAGNOSIS — M6281 Muscle weakness (generalized): Secondary | ICD-10-CM | POA: Diagnosis not present

## 2018-12-16 DIAGNOSIS — R2681 Unsteadiness on feet: Secondary | ICD-10-CM | POA: Diagnosis not present

## 2018-12-17 ENCOUNTER — Other Ambulatory Visit: Payer: Self-pay

## 2018-12-17 MED ORDER — LAMOTRIGINE 150 MG PO TABS: 150.0000 mg | ORAL_TABLET | ORAL | 0 refills | Status: DC

## 2018-12-17 MED ORDER — LAMOTRIGINE 200 MG PO TABS: 200.0000 mg | ORAL_TABLET | Freq: Every day | ORAL | 0 refills | Status: DC

## 2018-12-17 NOTE — Telephone Encounter (Signed)
Refill completed 12/15/18 for Lamictal 200 mg and 150 mg.  Pharmacy faxed request to use generic as reimbursement is lower with brand name.  New refill request done for lamotrigine.

## 2018-12-20 DIAGNOSIS — M6281 Muscle weakness (generalized): Secondary | ICD-10-CM | POA: Diagnosis not present

## 2018-12-20 DIAGNOSIS — R41841 Cognitive communication deficit: Secondary | ICD-10-CM | POA: Diagnosis not present

## 2018-12-20 DIAGNOSIS — R296 Repeated falls: Secondary | ICD-10-CM | POA: Diagnosis not present

## 2018-12-20 DIAGNOSIS — M79605 Pain in left leg: Secondary | ICD-10-CM | POA: Diagnosis not present

## 2018-12-20 DIAGNOSIS — R1312 Dysphagia, oropharyngeal phase: Secondary | ICD-10-CM | POA: Diagnosis not present

## 2018-12-20 DIAGNOSIS — R2681 Unsteadiness on feet: Secondary | ICD-10-CM | POA: Diagnosis not present

## 2018-12-21 DIAGNOSIS — R1312 Dysphagia, oropharyngeal phase: Secondary | ICD-10-CM | POA: Diagnosis not present

## 2018-12-21 DIAGNOSIS — M79605 Pain in left leg: Secondary | ICD-10-CM | POA: Diagnosis not present

## 2018-12-21 DIAGNOSIS — R41841 Cognitive communication deficit: Secondary | ICD-10-CM | POA: Diagnosis not present

## 2018-12-21 DIAGNOSIS — R296 Repeated falls: Secondary | ICD-10-CM | POA: Diagnosis not present

## 2018-12-21 DIAGNOSIS — R2681 Unsteadiness on feet: Secondary | ICD-10-CM | POA: Diagnosis not present

## 2018-12-21 DIAGNOSIS — M6281 Muscle weakness (generalized): Secondary | ICD-10-CM | POA: Diagnosis not present

## 2018-12-22 DIAGNOSIS — M79605 Pain in left leg: Secondary | ICD-10-CM | POA: Diagnosis not present

## 2018-12-22 DIAGNOSIS — R296 Repeated falls: Secondary | ICD-10-CM | POA: Diagnosis not present

## 2018-12-22 DIAGNOSIS — R41841 Cognitive communication deficit: Secondary | ICD-10-CM | POA: Diagnosis not present

## 2018-12-22 DIAGNOSIS — R2681 Unsteadiness on feet: Secondary | ICD-10-CM | POA: Diagnosis not present

## 2018-12-22 DIAGNOSIS — R1312 Dysphagia, oropharyngeal phase: Secondary | ICD-10-CM | POA: Diagnosis not present

## 2018-12-22 DIAGNOSIS — M6281 Muscle weakness (generalized): Secondary | ICD-10-CM | POA: Diagnosis not present

## 2018-12-23 DIAGNOSIS — R1312 Dysphagia, oropharyngeal phase: Secondary | ICD-10-CM | POA: Diagnosis not present

## 2018-12-23 DIAGNOSIS — R41841 Cognitive communication deficit: Secondary | ICD-10-CM | POA: Diagnosis not present

## 2018-12-23 DIAGNOSIS — M79605 Pain in left leg: Secondary | ICD-10-CM | POA: Diagnosis not present

## 2018-12-23 DIAGNOSIS — R2681 Unsteadiness on feet: Secondary | ICD-10-CM | POA: Diagnosis not present

## 2018-12-23 DIAGNOSIS — R296 Repeated falls: Secondary | ICD-10-CM | POA: Diagnosis not present

## 2018-12-23 DIAGNOSIS — M6281 Muscle weakness (generalized): Secondary | ICD-10-CM | POA: Diagnosis not present

## 2018-12-24 DIAGNOSIS — R2681 Unsteadiness on feet: Secondary | ICD-10-CM | POA: Diagnosis not present

## 2018-12-24 DIAGNOSIS — R41841 Cognitive communication deficit: Secondary | ICD-10-CM | POA: Diagnosis not present

## 2018-12-24 DIAGNOSIS — R296 Repeated falls: Secondary | ICD-10-CM | POA: Diagnosis not present

## 2018-12-24 DIAGNOSIS — R1312 Dysphagia, oropharyngeal phase: Secondary | ICD-10-CM | POA: Diagnosis not present

## 2018-12-24 DIAGNOSIS — M6281 Muscle weakness (generalized): Secondary | ICD-10-CM | POA: Diagnosis not present

## 2018-12-24 DIAGNOSIS — M79605 Pain in left leg: Secondary | ICD-10-CM | POA: Diagnosis not present

## 2018-12-27 DIAGNOSIS — M6281 Muscle weakness (generalized): Secondary | ICD-10-CM | POA: Diagnosis not present

## 2018-12-27 DIAGNOSIS — R296 Repeated falls: Secondary | ICD-10-CM | POA: Diagnosis not present

## 2018-12-27 DIAGNOSIS — M79605 Pain in left leg: Secondary | ICD-10-CM | POA: Diagnosis not present

## 2018-12-27 DIAGNOSIS — R1312 Dysphagia, oropharyngeal phase: Secondary | ICD-10-CM | POA: Diagnosis not present

## 2018-12-27 DIAGNOSIS — R41841 Cognitive communication deficit: Secondary | ICD-10-CM | POA: Diagnosis not present

## 2018-12-27 DIAGNOSIS — R2681 Unsteadiness on feet: Secondary | ICD-10-CM | POA: Diagnosis not present

## 2018-12-28 DIAGNOSIS — R41841 Cognitive communication deficit: Secondary | ICD-10-CM | POA: Diagnosis not present

## 2018-12-28 DIAGNOSIS — R2681 Unsteadiness on feet: Secondary | ICD-10-CM | POA: Diagnosis not present

## 2018-12-28 DIAGNOSIS — I10 Essential (primary) hypertension: Secondary | ICD-10-CM | POA: Diagnosis not present

## 2018-12-28 DIAGNOSIS — M6281 Muscle weakness (generalized): Secondary | ICD-10-CM | POA: Diagnosis not present

## 2018-12-28 DIAGNOSIS — R1312 Dysphagia, oropharyngeal phase: Secondary | ICD-10-CM | POA: Diagnosis not present

## 2018-12-28 DIAGNOSIS — R296 Repeated falls: Secondary | ICD-10-CM | POA: Diagnosis not present

## 2018-12-28 DIAGNOSIS — M79605 Pain in left leg: Secondary | ICD-10-CM | POA: Diagnosis not present

## 2018-12-29 ENCOUNTER — Encounter: Payer: Self-pay | Admitting: Nurse Practitioner

## 2018-12-29 DIAGNOSIS — R1312 Dysphagia, oropharyngeal phase: Secondary | ICD-10-CM | POA: Diagnosis not present

## 2018-12-29 DIAGNOSIS — M6281 Muscle weakness (generalized): Secondary | ICD-10-CM | POA: Diagnosis not present

## 2018-12-29 DIAGNOSIS — R41841 Cognitive communication deficit: Secondary | ICD-10-CM | POA: Diagnosis not present

## 2018-12-29 DIAGNOSIS — M79605 Pain in left leg: Secondary | ICD-10-CM | POA: Diagnosis not present

## 2018-12-29 DIAGNOSIS — R296 Repeated falls: Secondary | ICD-10-CM | POA: Diagnosis not present

## 2018-12-29 DIAGNOSIS — R2681 Unsteadiness on feet: Secondary | ICD-10-CM | POA: Diagnosis not present

## 2018-12-29 NOTE — Progress Notes (Signed)
This encounter was created in error - please disregard.

## 2018-12-31 DIAGNOSIS — R1312 Dysphagia, oropharyngeal phase: Secondary | ICD-10-CM | POA: Diagnosis not present

## 2018-12-31 DIAGNOSIS — M79605 Pain in left leg: Secondary | ICD-10-CM | POA: Diagnosis not present

## 2018-12-31 DIAGNOSIS — R296 Repeated falls: Secondary | ICD-10-CM | POA: Diagnosis not present

## 2018-12-31 DIAGNOSIS — M6281 Muscle weakness (generalized): Secondary | ICD-10-CM | POA: Diagnosis not present

## 2018-12-31 DIAGNOSIS — R41841 Cognitive communication deficit: Secondary | ICD-10-CM | POA: Diagnosis not present

## 2018-12-31 DIAGNOSIS — R2681 Unsteadiness on feet: Secondary | ICD-10-CM | POA: Diagnosis not present

## 2019-01-03 DIAGNOSIS — R1312 Dysphagia, oropharyngeal phase: Secondary | ICD-10-CM | POA: Diagnosis not present

## 2019-01-03 DIAGNOSIS — R2681 Unsteadiness on feet: Secondary | ICD-10-CM | POA: Diagnosis not present

## 2019-01-03 DIAGNOSIS — R41841 Cognitive communication deficit: Secondary | ICD-10-CM | POA: Diagnosis not present

## 2019-01-03 DIAGNOSIS — R296 Repeated falls: Secondary | ICD-10-CM | POA: Diagnosis not present

## 2019-01-03 DIAGNOSIS — M79605 Pain in left leg: Secondary | ICD-10-CM | POA: Diagnosis not present

## 2019-01-03 DIAGNOSIS — M6281 Muscle weakness (generalized): Secondary | ICD-10-CM | POA: Diagnosis not present

## 2019-01-04 ENCOUNTER — Telehealth: Payer: Self-pay | Admitting: Neurology

## 2019-01-04 MED ORDER — LAMICTAL 150 MG PO TABS: 150.0000 mg | ORAL_TABLET | ORAL | 0 refills | Status: DC

## 2019-01-04 NOTE — Telephone Encounter (Signed)
I have spoken to the patient.  She has a pending yearly appt on 01/18/2019 with Butler Denmark, NP.  She is still taking Lamictal 150 mg in the morning and Lamictal 200mg  at bedtime.  The last prescription sent in was for generic from her PCP office but states she needs brand name.  Reports having breakthrough seizures on generic in the past.  At this time, she only needs the 150mg  strength refilled.  It will be sent as brand name.  She is aware that it may require a prior authorization from her insurance company but we will not know until the pharmacy processes the prescription.  She verbalized understanding.

## 2019-01-04 NOTE — Telephone Encounter (Signed)
Pt is needing a refill on her lamoTRIgine (LAMICTAL) 150 MG tablet  And it has to be BRAND name sent to [facility] [address]

## 2019-01-05 DIAGNOSIS — R41841 Cognitive communication deficit: Secondary | ICD-10-CM | POA: Diagnosis not present

## 2019-01-05 DIAGNOSIS — M6281 Muscle weakness (generalized): Secondary | ICD-10-CM | POA: Diagnosis not present

## 2019-01-05 DIAGNOSIS — R1312 Dysphagia, oropharyngeal phase: Secondary | ICD-10-CM | POA: Diagnosis not present

## 2019-01-05 DIAGNOSIS — R296 Repeated falls: Secondary | ICD-10-CM | POA: Diagnosis not present

## 2019-01-05 DIAGNOSIS — M79605 Pain in left leg: Secondary | ICD-10-CM | POA: Diagnosis not present

## 2019-01-05 DIAGNOSIS — R2681 Unsteadiness on feet: Secondary | ICD-10-CM | POA: Diagnosis not present

## 2019-01-06 ENCOUNTER — Telehealth: Payer: Self-pay | Admitting: Neurology

## 2019-01-06 ENCOUNTER — Other Ambulatory Visit: Payer: Self-pay | Admitting: Neurology

## 2019-01-06 DIAGNOSIS — R1312 Dysphagia, oropharyngeal phase: Secondary | ICD-10-CM | POA: Diagnosis not present

## 2019-01-06 DIAGNOSIS — M6281 Muscle weakness (generalized): Secondary | ICD-10-CM | POA: Diagnosis not present

## 2019-01-06 DIAGNOSIS — M79605 Pain in left leg: Secondary | ICD-10-CM | POA: Diagnosis not present

## 2019-01-06 DIAGNOSIS — R2681 Unsteadiness on feet: Secondary | ICD-10-CM | POA: Diagnosis not present

## 2019-01-06 DIAGNOSIS — R296 Repeated falls: Secondary | ICD-10-CM | POA: Diagnosis not present

## 2019-01-06 DIAGNOSIS — R41841 Cognitive communication deficit: Secondary | ICD-10-CM | POA: Diagnosis not present

## 2019-01-06 MED ORDER — LAMICTAL 200 MG PO TABS: 200.0000 mg | ORAL_TABLET | Freq: Every day | ORAL | 1 refills | Status: DC

## 2019-01-06 NOTE — Telephone Encounter (Signed)
I will attempt this on Monday but went ahead and resent the 200mg  Lamitcal brand name refill in. I attempted to completed PA real quick but it didn't recognize the patient insurance. Hoping pharmacy will submit the PA request and I will be able to complete. Otherwise I will contact insurance company to complete over phone.

## 2019-01-06 NOTE — Telephone Encounter (Signed)
Noted per previous phone note we were waiting to receive information from pharmacy to complete a PA for the brand name medication.

## 2019-01-06 NOTE — Telephone Encounter (Signed)
Pt is asking RN to call her insurance company and make them aware that re: her LAMICTAL 150 MG tablet AND  lamoTRIgine (LAMICTAL) 200 MG tablet she needs brand name and that the generic does not work for her.

## 2019-01-10 NOTE — Telephone Encounter (Addendum)
I called [facility] [phone] or [phone]) and completed PA for brand name medically necessary Lamictal.  The patient reports breakthrough seizures on generic drug. Pt [id number].  PA approved through 01/10/2020. Case [id number].  The approval is valid for both of her prescribed doses (150mg  and 200mg  tablets).

## 2019-01-10 NOTE — Telephone Encounter (Signed)
I called [facility] [phone]) and notified them of this approval.

## 2019-01-11 DIAGNOSIS — R41841 Cognitive communication deficit: Secondary | ICD-10-CM | POA: Diagnosis not present

## 2019-01-11 DIAGNOSIS — M6281 Muscle weakness (generalized): Secondary | ICD-10-CM | POA: Diagnosis not present

## 2019-01-11 DIAGNOSIS — R296 Repeated falls: Secondary | ICD-10-CM | POA: Diagnosis not present

## 2019-01-11 DIAGNOSIS — R2681 Unsteadiness on feet: Secondary | ICD-10-CM | POA: Diagnosis not present

## 2019-01-11 DIAGNOSIS — M79605 Pain in left leg: Secondary | ICD-10-CM | POA: Diagnosis not present

## 2019-01-11 DIAGNOSIS — R1312 Dysphagia, oropharyngeal phase: Secondary | ICD-10-CM | POA: Diagnosis not present

## 2019-01-12 DIAGNOSIS — M79605 Pain in left leg: Secondary | ICD-10-CM | POA: Diagnosis not present

## 2019-01-12 DIAGNOSIS — R2681 Unsteadiness on feet: Secondary | ICD-10-CM | POA: Diagnosis not present

## 2019-01-12 DIAGNOSIS — R296 Repeated falls: Secondary | ICD-10-CM | POA: Diagnosis not present

## 2019-01-12 DIAGNOSIS — R1312 Dysphagia, oropharyngeal phase: Secondary | ICD-10-CM | POA: Diagnosis not present

## 2019-01-12 DIAGNOSIS — R41841 Cognitive communication deficit: Secondary | ICD-10-CM | POA: Diagnosis not present

## 2019-01-12 DIAGNOSIS — M6281 Muscle weakness (generalized): Secondary | ICD-10-CM | POA: Diagnosis not present

## 2019-01-13 ENCOUNTER — Telehealth: Payer: Self-pay | Admitting: Neurology

## 2019-01-13 ENCOUNTER — Other Ambulatory Visit: Payer: Self-pay | Admitting: *Deleted

## 2019-01-13 MED ORDER — LAMICTAL 200 MG PO TABS: 200.0000 mg | ORAL_TABLET | Freq: Every day | ORAL | 3 refills | Status: DC

## 2019-01-13 MED ORDER — IPRATROPIUM BROMIDE HFA 17 MCG/ACT IN AERS: 2.0000 | INHALATION_SPRAY | Freq: Four times a day (QID) | RESPIRATORY_TRACT | 12 refills | Status: AC | PRN

## 2019-01-13 MED ORDER — LAMICTAL 150 MG PO TABS: 150.0000 mg | ORAL_TABLET | ORAL | 2 refills | Status: DC

## 2019-01-13 NOTE — Telephone Encounter (Signed)
Pt is needing a refill on her LAMICTAL 150 MG tablet and LAMICTAL 200 MG tablet and is needing 2 refills on them. She states that they need to be faxed to the manufacture that makes them so that they can be free and the fax# is [phone] she states you can put her ID [id number] on it  Please advise.

## 2019-01-13 NOTE — Telephone Encounter (Signed)
Jessica [name] Jessica [name]

## 2019-01-13 NOTE — Telephone Encounter (Signed)
Prescriptions printed, signed by Dr. Jannifer Franklin in Dr. Rhea Belton absence from office and faxed to [facility] patient assistance at the number below.  Confirmation received.
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
## 2019-01-18 ENCOUNTER — Telehealth: Payer: Self-pay | Admitting: Neurology

## 2019-01-18 ENCOUNTER — Encounter: Payer: Self-pay | Admitting: Neurology

## 2019-01-18 ENCOUNTER — Ambulatory Visit (INDEPENDENT_AMBULATORY_CARE_PROVIDER_SITE_OTHER): Payer: Medicare Other | Admitting: Neurology

## 2019-01-18 ENCOUNTER — Ambulatory Visit: Payer: Medicare Other | Admitting: Adult Health

## 2019-01-18 ENCOUNTER — Other Ambulatory Visit: Payer: Self-pay

## 2019-01-18 DIAGNOSIS — G40909 Epilepsy, unspecified, not intractable, without status epilepticus: Secondary | ICD-10-CM

## 2019-01-18 NOTE — Telephone Encounter (Signed)
Labs have been printed and faxed to [facility] nurses station at [phone]. Confirmation fax has been received.

## 2019-01-18 NOTE — Progress Notes (Signed)
I have reviewed and agreed above plan. 

## 2019-01-18 NOTE — Addendum Note (Signed)
Addended by: Inis Sizer D on: 01/18/2019 04:15 PM   Modules accepted: Orders

## 2019-01-18 NOTE — Telephone Encounter (Signed)
Please fax lab orders to [facility] to have blood work done (CBC w/ diff, CMP, zonesemide level, lamictal level) at facility for [place] to review.

## 2019-01-19 NOTE — Telephone Encounter (Signed)
Spoke with Helene Kelp at [facility] and she gave the direct fax number. Confirmation fax has been received.

## 2019-01-19 NOTE — Telephone Encounter (Signed)
Jessica [name] @ [facility] has called back in response to fax from [facility].  Jessica [name] said she only received page 1 out of the 3 pages.  Jessica [name] sates she needs to know what labs need to be drawn.  Please call

## 2019-01-20 DIAGNOSIS — I10 Essential (primary) hypertension: Secondary | ICD-10-CM | POA: Diagnosis not present

## 2019-01-27 LAB — BASIC METABOLIC PANEL WITH GFR
BUN: 26 — AB (ref 4–21)
Creatinine: 1.2 — AB (ref 0.5–1.1)
Glucose: 94
Potassium: 4.3 (ref 3.4–5.3)
Sodium: 141 (ref 137–147)

## 2019-01-27 LAB — CBC AND DIFFERENTIAL
HCT: 35 — AB (ref 36–46)
Hemoglobin: 11.4 — AB (ref 12.0–16.0)
Platelets: 251 (ref 150–399)
WBC: 6.6

## 2019-01-27 LAB — HEPATIC FUNCTION PANEL
ALT: 6 — AB (ref 7–35)
AST: 11 — AB (ref 13–35)
Alkaline Phosphatase: 76 (ref 25–125)
Bilirubin, Total: 0.3

## 2019-02-07 ENCOUNTER — Other Ambulatory Visit: Payer: Self-pay | Admitting: Neurology

## 2019-02-10 ENCOUNTER — Telehealth: Payer: Self-pay | Admitting: *Deleted

## 2019-02-10 NOTE — Telephone Encounter (Signed)
LMVM for pt to return call to schedule 96mo RV with ss/NP.  If she call please schedule.

## 2019-03-03 ENCOUNTER — Non-Acute Institutional Stay: Payer: Medicare Other | Admitting: Nurse Practitioner

## 2019-03-03 ENCOUNTER — Encounter: Payer: Self-pay | Admitting: Nurse Practitioner

## 2019-03-03 ENCOUNTER — Other Ambulatory Visit: Payer: Self-pay

## 2019-03-03 DIAGNOSIS — I10 Essential (primary) hypertension: Secondary | ICD-10-CM

## 2019-03-03 DIAGNOSIS — G40309 Generalized idiopathic epilepsy and epileptic syndromes, not intractable, without status epilepticus: Secondary | ICD-10-CM

## 2019-03-03 DIAGNOSIS — E876 Hypokalemia: Secondary | ICD-10-CM | POA: Diagnosis not present

## 2019-03-03 NOTE — Assessment & Plan Note (Signed)
Continue Lamictal, Zonisamide, f/u neurology, update CBC/diff

## 2019-03-03 NOTE — Assessment & Plan Note (Signed)
Blood pressure is in control, continue Losartan 142m qd, Metoprolol 551mqd. Update CMP/eGFR

## 2019-03-03 NOTE — Progress Notes (Signed)
Location:   [facility] Place of Service:  [facility] (13)[facility] Provider: Marlana Latus NP  Code Status: DNR Goals of Care: [place] Advanced Directives 03/03/2019  Does Patient Have a Medical Advance Directive? Yes  Type of Advance Directive Out of facility DNR (pink MOST or yellow form);Healthcare Power of Attorney  Does patient want to make changes to medical advance directive? No - Patient declined  Copy of [place] in Chart? Yes - validated most recent copy scanned in chart (See row information)  Pre-existing out of facility DNR order (yellow form or pink MOST form) Yellow form placed in chart (order not valid for inpatient use);Pink MOST form placed in chart (order not valid for inpatient use)     Chief Complaint  Patient presents with  . Medical Management of Chronic Issues    6 month follow up     HPI: Patient is a 85 y.o. female seen today for medical management of chronic diseases.    The patient has history of seizures, no active seizures since last visited, on Lamictal 111m qd, 2030mbid, Zonisamide 10058md. HTN, blood pressure is controlled on Losartan 100m42m, Metoprolol 50mg70m  Hypokalemia, stable, off Kcl 20meq33m last serum K 4.7 12/13/18   Past Medical History:  Diagnosis Date  . Alcohol abuse 11/11/2010  . Alcohol abuse, unspecified   . Anxiety   . Atrial fibrillation (HCC) [place]/2012  . Bone infection of left hand (HCC) 3[place]/2010   secondary to injury  . Cancer (HCC)  [place] cancer-hx of chemo  . Chronic airway obstruction, not elsewhere classified 10/01/2011  . Confusion   . COPD (chronic obstructive pulmonary disease) (HCC)  [place] and [place] 04/24/2005  . Edema 12/03/2011  . Embolism - blood clot 1996   left breast  . Generalized convulsive epilepsy without mention of intractable epilepsy   . GERD (gastroesophageal reflux disease)   . Hemorrhoids   . Hyperlipidemia   . Hypertension   . Insomnia, unspecified 11/11/2010  . Major  depressive disorder, single episode, unspecified 11/11/2010  . Malignant neoplasm of stomach, unspecified site 11/11/2010  . Melanoma (HCC)  [place]  . Peripheral vascular disease (HCC)  [place]izures (HCC)  [place]ptomatic menopausal or female climacteric states 11/11/2010  . Tachycardia, unspecified   . Tobacco use disorder   . Urgency of urination     Past Surgical History:  Procedure Laterality Date  . ABDOMINAL HYSTERECTOMY  1963  . BLADDER REPAIR    . breast biopsies     x2 on left, x 1 on right  . c-sections  1957, 1959, 1961   x3  . COLON RESECTION  1995  . COLONOSCOPY  10/21/2012   Dr. OutlawPaulita Fujitaticulosis, sessile polyps biopsy   . heart ablation  1997  . HEMORRHOID SURGERY    . laser surgery to right eye  10/04/2007  . left femoral artery stent  05/07/2004  . melanoma removed from left groin  09/2004  . melanoma removed from left leg  09/06/2004  . orif left humerus fracture  03/27/2005  . porta cath placement  1995  . porta cath removal  1996  . removal of tumor in left knee  11/15/2010  . skin graft to left leg  12/05/2004  . VOCAL CORD INJECTION  1996, 1997    Allergies  Allergen Reactions  . Aspirin Nausea And Vomiting  . Effexor [Venlafaxine Hcl]     Seizure  .  Keflex [Cephalexin]   . Venlafaxine Other (See Comments)    seizures  . Penicillins Rash    Has patient had a PCN reaction causing immediate rash, facial/tongue/throat swelling, SOB or lightheadedness with hypotension: Yes Has patient had a PCN reaction causing severe rash involving mucus membranes or skin necrosis: No Has patient had a PCN reaction that required hospitalization: No Has patient had a PCN reaction occurring within the last 10 years: No If all of the above answers are "NO", then may proceed with Cephalosporin use.   . Zithromax [Azithromycin] Rash    Allergies as of 03/03/2019      Reactions   Aspirin Nausea And Vomiting   Effexor [venlafaxine Hcl]    Seizure   Keflex [cephalexin]     Venlafaxine Other (See Comments)   seizures   Penicillins Rash   Has patient had a PCN reaction causing immediate rash, facial/tongue/throat swelling, SOB or lightheadedness with hypotension: Yes Has patient had a PCN reaction causing severe rash involving mucus membranes or skin necrosis: No Has patient had a PCN reaction that required hospitalization: No Has patient had a PCN reaction occurring within the last 10 years: No If all of the above answers are "NO", then may proceed with Cephalosporin use.   Zithromax [azithromycin] Rash      Medication List       Accurate as of March 03, 2019  4:57 PM. If you have any questions, ask your nurse or doctor.        STOP taking these medications   Potassium Chloride ER 20 MEQ Tbcr Stopped by:  Jillian Pianka X Zayne Draheim, NP     TAKE these medications   ipratropium 17 MCG/ACT inhaler Commonly known as:  ATROVENT HFA Inhale 2 puffs into the lungs every 6 (six) hours as needed for wheezing.   lamoTRIgine 200 MG tablet Commonly known as:  LaMICtal Take 1 tablet (200 mg total) by mouth at bedtime.   LaMICtal 200 MG tablet Generic drug:  lamoTRIgine Take 1 tablet (200 mg total) by mouth daily.   LaMICtal 150 MG tablet Generic drug:  lamoTRIgine Take 1 tablet (150 mg total) by mouth every morning.   losartan 100 MG tablet Commonly known as:  COZAAR TAKE ONE TABLET BY MOUTH DAILY **MUST CALL MD FOR APPOINTMENT   Magnesium Oxide 400 (240 Mg) MG Tabs Take 1 tablet (400 mg total) by mouth daily.   metoprolol succinate 50 MG 24 hr tablet Commonly known as:  TOPROL-XL Take 1 tablet (50 mg total) by mouth daily. Take with or immediately following a meal.   zonisamide 100 MG capsule Commonly known as:  ZONEGRAN TAKE ONE CAPSULE BY MOUTH EVERY MORNING AND 2 EVERY NIGHT       Review of Systems:  Review of Systems  Constitutional: Negative for activity change, appetite change, chills, diaphoresis, fatigue and fever.  HENT: Positive for hearing  loss. Negative for congestion and voice change.   Respiratory: Positive for cough and shortness of breath. Negative for wheezing.        Chronic hacking cough, DOE  Cardiovascular: Negative for chest pain, palpitations and leg swelling.  Gastrointestinal: Negative for abdominal distention, abdominal pain, constipation, diarrhea, nausea and vomiting.  Genitourinary: Negative for difficulty urinating, dysuria and urgency.  Musculoskeletal: Positive for gait problem.  Skin: Negative for color change and pallor.  Neurological: Negative for dizziness, seizures, speech difficulty, weakness, light-headedness and headaches.       Memory lapses.   Psychiatric/Behavioral: Negative for agitation, behavioral problems,  hallucinations and sleep disturbance. The patient is not nervous/anxious.     Health Maintenance  Topic Date Due  . FOOT EXAM  05/28/1942  . OPHTHALMOLOGY EXAM  05/28/1942  . TETANUS/TDAP  05/28/1953  . HEMOGLOBIN A1C  12/06/2018  . PNA vac Low Risk Adult (2 of 2 - PPSV23) 02/26/2019  . INFLUENZA VACCINE  04/30/2019  . DEXA SCAN  Completed    Physical Exam: Vitals:   03/03/19 1509  BP: 140/68  Pulse: 70  Resp: 18  Temp: 98.3 F (36.8 C)  TempSrc: Oral  SpO2: 90%  Weight: 94 lb 3.2 oz (42.7 kg)  Height: 5' 4"  (1.626 m)   Body mass index is 16.17 kg/m. Physical Exam Vitals signs and nursing note reviewed.  Constitutional:      General: She is not in acute distress.    Appearance: Normal appearance. She is not ill-appearing, toxic-appearing or diaphoretic.  HENT:     Head: Normocephalic and atraumatic.     Nose: Nose normal. No congestion.     Mouth/Throat:     Mouth: Mucous membranes are moist.  Eyes:     Extraocular Movements: Extraocular movements intact.     Conjunctiva/sclera: Conjunctivae normal.     Pupils: Pupils are equal, round, and reactive to light.  Neck:     Musculoskeletal: Normal range of motion and neck supple.  Cardiovascular:     Rate and  Rhythm: Normal rate and regular rhythm.     Heart sounds: No murmur.  Pulmonary:     Breath sounds: No wheezing, rhonchi or rales.     Comments: Decreased air entry R+L lungs.  Abdominal:     General: There is no distension.     Palpations: Abdomen is soft.     Tenderness: There is no abdominal tenderness. There is no right CVA tenderness, left CVA tenderness, guarding or rebound.  Musculoskeletal:     Right lower leg: No edema.     Left lower leg: No edema.     Comments: Trace edema BLE  Skin:    General: Skin is warm and dry.  Neurological:     General: No focal deficit present.     Mental Status: She is alert. Mental status is at baseline.     Cranial Nerves: No cranial nerve deficit.     Motor: No weakness.     Coordination: Coordination normal.     Gait: Gait abnormal.     Comments: Oriented to person and place.   Psychiatric:        Mood and Affect: Mood normal.        Behavior: Behavior normal.        Thought Content: Thought content normal.     Labs reviewed: Basic Metabolic Panel: Recent Labs    06/07/18 0000  08/17/18 1007 11/28/18 1019 11/28/18 1130 12/13/18 0000 01/27/19  NA 141  --  141 140  --  139 141  K 4.1  --  3.6 2.8*  --  4.7 4.3  CL 107  --  108 105  --  107  --   CO2 28  --  25 27  --  25  --   GLUCOSE 103*  --  111* 113*  --  104*  --   BUN 18  --  21 23  --  20 26*  CREATININE 1.09*   < > 0.85 1.23*  --  1.18* 1.2*  CALCIUM 9.5  --  8.6* 9.0  --  9.6  --  MG  --   --   --   --  1.3*  --   --   TSH 2.67  --   --   --   --   --   --    < > = values in this interval not displayed.   Liver Function Tests: Recent Labs    08/17/18 1007 11/28/18 1019 12/13/18 0000 01/27/19  AST 19 13* 14 11*  ALT 16 9 7  6*  ALKPHOS 82 81  --  76  BILITOT 0.7 0.5 0.3  --   PROT 7.2 7.2 6.9  --   ALBUMIN 3.9 3.8  --   --    No results for input(s): LIPASE, AMYLASE in the last 8760 hours. No results for input(s): AMMONIA in the last 8760 hours. CBC:  Recent Labs    06/07/18 0000 08/17/18 1007 11/28/18 1019 01/27/19  WBC 7.9 10.6* 6.6 6.6  NEUTROABS 4,258  --  5.3  --   HGB 14.2 13.0 13.1 11.4*  HCT 42.2 41.6 42.7 35*  MCV 86.1 92.9 92.4  --   PLT 283 187 256 251   Lipid Panel: Recent Labs    06/07/18 0000  CHOL 207*  HDL 68  LDLCALC 121*  TRIG 81  CHOLHDL 3.0   Lab Results  Component Value Date   HGBA1C 5.7 (H) 06/07/2018    Procedures since last visit: No results found.  Assessment/Plan  Hypertension Blood pressure is in control, continue Losartan 153m qd, Metoprolol 522mqd. Update CMP/eGFR  Generalized convulsive epilepsy (HCC) Continue Lamictal, Zonisamide, f/u neurology, update CBC/diff  Hypokalemia Update CMP/eGFR, serum Mg  Hypomagnesemia Update serum Mg level, continue Mg supplement.    Labs/tests ordered: CBC/diff, CMP/eGFR, Mg   Next appt:  prn

## 2019-03-03 NOTE — Assessment & Plan Note (Signed)
Update serum Mg level, continue Mg supplement.

## 2019-03-03 NOTE — Assessment & Plan Note (Addendum)
Update CMP/eGFR, serum Mg

## 2019-03-08 LAB — HEPATIC FUNCTION PANEL
ALT: 8 (ref 7–35)
AST: 14 (ref 13–35)

## 2019-03-08 LAB — BASIC METABOLIC PANEL
BUN: 17 (ref 4–21)
Creatinine: 1.3 — AB (ref 0.5–1.1)
Glucose: 152
Potassium: 4.2 (ref 3.4–5.3)
Sodium: 139 (ref 137–147)

## 2019-03-08 LAB — CBC AND DIFFERENTIAL
HCT: 43 (ref 36–46)
Hemoglobin: 14.1 (ref 12.0–16.0)
Platelets: 302 (ref 150–399)

## 2019-04-05 LAB — NOVEL CORONAVIRUS, NAA: SARS-CoV-2, NAA: NOT DETECTED

## 2019-04-18 DIAGNOSIS — B342 Coronavirus infection, unspecified: Secondary | ICD-10-CM | POA: Diagnosis not present

## 2019-04-25 ENCOUNTER — Non-Acute Institutional Stay: Payer: Medicare Other | Admitting: Nurse Practitioner

## 2019-04-25 ENCOUNTER — Encounter: Payer: Self-pay | Admitting: Nurse Practitioner

## 2019-04-25 DIAGNOSIS — I10 Essential (primary) hypertension: Secondary | ICD-10-CM | POA: Diagnosis not present

## 2019-04-25 DIAGNOSIS — R197 Diarrhea, unspecified: Secondary | ICD-10-CM | POA: Diagnosis not present

## 2019-04-25 DIAGNOSIS — R569 Unspecified convulsions: Secondary | ICD-10-CM | POA: Diagnosis not present

## 2019-04-25 NOTE — Assessment & Plan Note (Signed)
Not new, but its reported lasted longer than usual. 04/25/19 diarrhea 3-4 days, CBC/diff, CMP/eGFR, C-diff toxin A/B x3, Imodium prn

## 2019-04-25 NOTE — Assessment & Plan Note (Signed)
Blood pressure is controlled, continue Metoprolol, Losartan.  °

## 2019-04-25 NOTE — Progress Notes (Signed)
Location:   [facility] Room Number: 802 Place of Service:  ALF (13) Provider: Xie  NP  ,  X, NP  Patient Care Team: ,  X, NP as PCP - General (Internal Medicine) ,  X, NP as Nurse Practitioner (Internal Medicine)  Extended Emergency Contact Information Primary Emergency Contact: Powers,Kristine Address: [address]          [place], [place] Home Phone: [phone] Relation: Daughter  Code Status: DNR Goals of care: Advanced Directive information Advanced Directives 04/25/2019  Does Patient Have a Medical Advance Directive? Yes  Type of Advance Directive Healthcare Power of Attorney;Living will;Out of facility DNR (pink MOST or yellow form)  Does patient want to make changes to medical advance directive? No - Patient declined  Copy of Healthcare Power of Attorney in Chart? Yes - validated most recent copy scanned in chart (See row information)  Pre-existing out of facility DNR order (yellow form or pink MOST form) Yellow form placed in chart (order not valid for inpatient use)     Chief Complaint  Patient presents with  . Acute Visit    diarrhea for 4-5 days    HPI:  Pt is a 85 y.o. female seen today for an acute visit for reported diarrhea for 3-4 days, staff stated it was after a meal consumption, the patient stated she had IBS in the past, but diarrhea doesn't last as long as this time. She denied abd pain, nausea, vomiting, fever, malaise. She admitted her appetite has been poor. She has history of seizures, stable, on Lamictal 200mg qd, 150mg qd, Zonisamide 100mg qd, 200mg qd.  HTN, blood pressure is controlled on Metoprolol 50mg qd, Losartan 100mg qd.    Past Medical History:  Diagnosis Date  . Alcohol abuse 11/11/2010  . Alcohol abuse, unspecified   . Anxiety   . Atrial fibrillation (HCC) 11/11/2010  . Bone infection of left hand (HCC) 12/22/2008   secondary to injury  . Cancer (HCC)    colon cancer-hx of chemo  . Chronic airway obstruction, not elsewhere classified 10/01/2011  . Confusion   . COPD (chronic obstructive pulmonary disease) (HCC)   . Dizziness and giddiness 04/24/2005  . Edema 12/03/2011  . Embolism - blood clot 1996   left breast  . Generalized convulsive epilepsy without mention of intractable epilepsy   . GERD (gastroesophageal reflux disease)   . Hemorrhoids   . Hyperlipidemia   . Hypertension   . Insomnia, unspecified 11/11/2010  . Major depressive disorder, single episode, unspecified 11/11/2010  . Malignant neoplasm of stomach, unspecified site 11/11/2010  . Melanoma (HCC)    metastatic  . Peripheral vascular disease (HCC)   . Seizures (HCC)   . Symptomatic menopausal or female climacteric states 11/11/2010  . Tachycardia, unspecified   . Tobacco use disorder   . Urgency of urination    Past Surgical History:  Procedure Laterality Date  . ABDOMINAL HYSTERECTOMY  1963  . BLADDER REPAIR    . breast biopsies     x2 on left, x 1 on right  . c-sections  1957, 1959, 1961   x3  . COLON RESECTION  1995  . COLONOSCOPY  10/21/2012   Dr. Outlaw diverticulosis, sessile polyps biopsy   . heart ablation  1997  . HEMORRHOID SURGERY    . laser surgery to right eye  10/04/2007  . left femoral artery stent  05/07/2004  . melanoma removed from left groin  09/2004  .   melanoma removed from left leg  09/06/2004  . orif left humerus fracture  03/27/2005  . porta cath placement  1995  . porta cath removal  1996  . removal of tumor in left knee  11/15/2010  . skin graft to left leg  12/05/2004  . VOCAL CORD INJECTION  1996, 1997    Allergies  Allergen Reactions  . Aspirin Nausea And Vomiting  . Effexor [Venlafaxine Hcl]     Seizure  . Keflex [Cephalexin]   . Levaquin [Levofloxacin]   . Venlafaxine Other (See Comments)    seizures  . Penicillins Rash    Has patient had a PCN reaction causing immediate rash, facial/tongue/throat swelling, SOB or lightheadedness  with hypotension: Yes Has patient had a PCN reaction causing severe rash involving mucus membranes or skin necrosis: No Has patient had a PCN reaction that required hospitalization: No Has patient had a PCN reaction occurring within the last 10 years: No If all of the above answers are "NO", then may proceed with Cephalosporin use.   . Zithromax [Azithromycin] Rash    Allergies as of 04/25/2019      Reactions   Aspirin Nausea And Vomiting   Effexor [venlafaxine Hcl]    Seizure   Keflex [cephalexin]    Levaquin [levofloxacin]    Venlafaxine Other (See Comments)   seizures   Penicillins Rash   Has patient had a PCN reaction causing immediate rash, facial/tongue/throat swelling, SOB or lightheadedness with hypotension: Yes Has patient had a PCN reaction causing severe rash involving mucus membranes or skin necrosis: No Has patient had a PCN reaction that required hospitalization: No Has patient had a PCN reaction occurring within the last 10 years: No If all of the above answers are "NO", then may proceed with Cephalosporin use.   Zithromax [azithromycin] Rash      Medication List       Accurate as of April 25, 2019  3:12 PM. If you have any questions, ask your nurse or doctor.        ipratropium 17 MCG/ACT inhaler Commonly known as: ATROVENT HFA Inhale 2 puffs into the lungs every 6 (six) hours as needed for wheezing.   LaMICtal 200 MG tablet Generic drug: lamoTRIgine Take 1 tablet (200 mg total) by mouth daily. What changed: Another medication with the same name was removed. Continue taking this medication, and follow the directions you see here. Changed by: Talani Brazee X Gloria Ricardo, NP   LaMICtal 150 MG tablet Generic drug: lamoTRIgine Take 1 tablet (150 mg total) by mouth every morning. What changed: Another medication with the same name was removed. Continue taking this medication, and follow the directions you see here. Changed by: Any Mcneice X Khairi Garman, NP   losartan 100 MG tablet  Commonly known as: COZAAR TAKE ONE TABLET BY MOUTH DAILY **MUST CALL MD FOR APPOINTMENT   Magnesium Oxide 400 (240 Mg) MG Tabs Take 1 tablet (400 mg total) by mouth daily.   metoprolol succinate 50 MG 24 hr tablet Commonly known as: TOPROL-XL Take 1 tablet (50 mg total) by mouth daily. Take with or immediately following a meal.   zonisamide 100 MG capsule Commonly known as: ZONEGRAN TAKE ONE CAPSULE BY MOUTH EVERY MORNING AND 2 EVERY NIGHT       Review of Systems  Constitutional: Positive for appetite change and unexpected weight change. Negative for activity change, chills, diaphoresis, fatigue and fever.       Weight loss #2Ibs in the past 2 months.   HENT: Positive  for hearing loss. Negative for congestion and voice change.   Respiratory: Negative for cough, shortness of breath and wheezing.   Cardiovascular: Negative for chest pain and leg swelling.  Gastrointestinal: Positive for diarrhea. Negative for abdominal distention, abdominal pain, blood in stool, constipation, nausea and vomiting.  Genitourinary: Negative for difficulty urinating, dysuria and urgency.  Musculoskeletal: Positive for gait problem.  Skin: Negative for color change and pallor.  Neurological: Negative for dizziness, tremors, seizures, facial asymmetry and headaches.  Psychiatric/Behavioral: Negative for agitation, behavioral problems, hallucinations and sleep disturbance. The patient is not nervous/anxious.     Immunization History  Administered Date(s) Administered  . Influenza Whole 06/29/2012, 07/13/2013  . Influenza-Unspecified 07/17/2014  . Pneumococcal Conjugate-13 02/25/2018   Pertinent  Health Maintenance Due  Topic Date Due  . FOOT EXAM  05/28/1942  . OPHTHALMOLOGY EXAM  05/28/1942  . HEMOGLOBIN A1C  12/06/2018  . PNA vac Low Risk Adult (2 of 2 - PPSV23) 02/26/2019  . INFLUENZA VACCINE  04/30/2019  . DEXA SCAN  Completed   Fall Risk  10/06/2017 08/28/2017 06/30/2017 04/24/2017 01/17/2016   Falls in the past year? Yes No Yes No No  Comment - - - Emmi Telephone Survey: data to providers prior to load -  Number falls in past yr: 1 - 1 - -  Injury with Fall? Yes - No - -  Follow up - - Falls evaluation completed;Education provided;Falls prevention discussed - -   Functional Status Survey:    Vitals:   04/25/19 1430  BP: 136/70  Pulse: 68  Resp: 18  Temp: 98.3 F (36.8 C)  SpO2: 90%  Weight: 92 lb 9.6 oz (42 kg)  Height: 5' 4" (1.626 m)   Body mass index is 15.89 kg/m. Physical Exam Vitals signs and nursing note reviewed.  Constitutional:      General: She is not in acute distress.    Appearance: Normal appearance. She is not ill-appearing or toxic-appearing.  HENT:     Head: Normocephalic.     Nose: Nose normal.     Mouth/Throat:     Mouth: Mucous membranes are dry.  Eyes:     Extraocular Movements: Extraocular movements intact.     Conjunctiva/sclera: Conjunctivae normal.     Pupils: Pupils are equal, round, and reactive to light.  Neck:     Musculoskeletal: Normal range of motion and neck supple.  Cardiovascular:     Rate and Rhythm: Normal rate and regular rhythm.     Heart sounds: No murmur.  Pulmonary:     Effort: Pulmonary effort is normal.     Breath sounds: No wheezing, rhonchi or rales.  Abdominal:     General: Bowel sounds are normal. There is no distension.     Palpations: Abdomen is soft.     Tenderness: There is no abdominal tenderness. There is no right CVA tenderness, left CVA tenderness, guarding or rebound.     Hernia: No hernia is present.  Musculoskeletal:        General: No deformity or signs of injury.     Right lower leg: No edema.     Left lower leg: No edema.  Skin:    General: Skin is warm and dry.     Coloration: Skin is not pale.  Neurological:     General: No focal deficit present.     Mental Status: She is alert. Mental status is at baseline.     Motor: No weakness.     Coordination: Coordination normal.       Gait:  Gait normal.     Comments: Oriented to person and place.   Psychiatric:        Mood and Affect: Mood normal.        Behavior: Behavior normal.        Thought Content: Thought content normal.     Labs reviewed: Recent Labs    08/17/18 1007 11/28/18 1019 11/28/18 1130 12/13/18 0000 01/27/19 03/08/19  NA 141 140  --  139 141 139  K 3.6 2.8*  --  4.7 4.3 4.2  CL 108 105  --  107  --   --   CO2 25 27  --  25  --   --   GLUCOSE 111* 113*  --  104*  --   --   BUN 21 23  --  20 26* 17  CREATININE 0.85 1.23*  --  1.18* 1.2* 1.3*  CALCIUM 8.6* 9.0  --  9.6  --   --   MG  --   --  1.3*  --   --   --    Recent Labs    08/17/18 1007 11/28/18 1019 12/13/18 0000 01/27/19 03/08/19  AST 19 13* 14 11* 14  ALT _0 6* 8  ALKPHOS 82 81  --  76  --   BILITOT 0.7 0.5 0.3  --   --   PROT 7.2 7.2 6.9  --   --   ALBUMIN 3.9 3.8  --   --   --    Recent Labs    06/07/18 0000 08/17/18 1007 11/28/18 1019 01/27/19 03/08/19  WBC 7.9 10.6* 6.6 6.6  --   NEUTROABS 4,258  --  5.3  --   --   HGB 14.2 13.0 13.1 11.4* 14.1  HCT 42.2 41.6 42.7 35* 43  MCV 86.1 92.9 92.4  --   --   PLT 283 187 256 251 302   Lab Results  Component Value Date   TSH 2.67 06/07/2018   Lab Results  Component Value Date   HGBA1C 5.7 (H) 06/07/2018   Lab Results  Component Value Date   CHOL 207 (H) 06/07/2018   HDL 68 06/07/2018   LDLCALC 121 (H) 06/07/2018   TRIG 81 06/07/2018   CHOLHDL 3.0 06/07/2018    Significant Diagnostic Results in last 30 days:  No results found.  Assessment/Plan: Diarrhea Not new, but its reported lasted longer than usual. 04/25/19 diarrhea 3-4 days, CBC/diff, CMP/eGFR, C-diff toxin A/B x3, Imodium prn    Seizures (HCC) Stable, continue Lamictal, Zonisamide  Hypertension Blood pressure is controlled, continue Metoprolol, Losartan.     Family/ staff Communication: plan of care reviewed with the patient and charge nurse.   Labs/tests ordered:  CBC/diff, CMP/eGFR, C-diff  Toxin A/B x3  Time spend 40 minutes.

## 2019-04-25 NOTE — Assessment & Plan Note (Signed)
Stable, continue Lamictal, Zonisamide

## 2019-04-26 DIAGNOSIS — I10 Essential (primary) hypertension: Secondary | ICD-10-CM | POA: Diagnosis not present

## 2019-04-26 LAB — HEPATIC FUNCTION PANEL
ALT: 7 (ref 7–35)
AST: 15 (ref 13–35)
Alkaline Phosphatase: 76 (ref 25–125)
Bilirubin, Total: 0.3

## 2019-04-26 LAB — CBC AND DIFFERENTIAL
HCT: 40 (ref 36–46)
Hemoglobin: 12.8 (ref 12.0–16.0)
Platelets: 233 (ref 150–399)
WBC: 5.4

## 2019-04-26 LAB — BASIC METABOLIC PANEL
BUN: 18 (ref 4–21)
Creatinine: 1.4 — AB (ref 0.5–1.1)
Glucose: 109
Potassium: 4.1 (ref 3.4–5.3)
Sodium: 139 (ref 137–147)

## 2019-05-02 ENCOUNTER — Other Ambulatory Visit: Payer: Self-pay

## 2019-05-05 ENCOUNTER — Non-Acute Institutional Stay: Payer: Medicare Other | Admitting: Nurse Practitioner

## 2019-05-05 ENCOUNTER — Other Ambulatory Visit: Payer: Self-pay

## 2019-05-05 VITALS — BP 132/70 | HR 75 | Temp 98.0°F | Resp 20 | Ht 65.0 in | Wt 92.0 lb

## 2019-05-05 DIAGNOSIS — Z0000 Encounter for general adult medical examination without abnormal findings: Secondary | ICD-10-CM

## 2019-05-05 NOTE — Patient Instructions (Signed)
Jessica [name] , Thank you for taking time to come for your Medicare Wellness Visit. I appreciate your ongoing commitment to your health goals. Please review the following plan we discussed and let me know if I can assist you in the future.   Screening recommendations/referrals:  Preventive Care 8 Years and Older, Female Preventive care refers to lifestyle choices and visits with your health care provider that can promote health and wellness. What does preventive care include?  A yearly physical exam. This is also called an annual well check.  Dental exams once or twice a year.  Routine eye exams. Ask your health care provider how often you should have your eyes checked.  Personal lifestyle choices, including:  Daily care of your teeth and gums.  Regular physical activity.  Eating a healthy diet.  Avoiding tobacco and drug use.  Limiting alcohol use.  Practicing safe sex.  Taking low-dose aspirin every day.  Taking vitamin and mineral supplements as recommended by your health care provider. What happens during an annual well check? The services and screenings done by your health care provider during your annual well check will depend on your age, overall health, lifestyle risk factors, and family history of disease. Counseling  Your health care provider may ask you questions about your:  Alcohol use.  Tobacco use.  Drug use.  Emotional well-being.  Home and relationship well-being.  Sexual activity.  Eating habits.  History of falls.  Memory and ability to understand (cognition).  Work and work Statistician.  Reproductive health. Screening  You may have the following tests or measurements:  Height, weight, and BMI.  Blood pressure.  Lipid and cholesterol levels. These may be checked every 5 years, or more frequently if you are over 68 years old.  Skin check.  Lung cancer screening. You may have this screening every year starting at age 64 if you  have a 30-pack-year history of smoking and currently smoke or have quit within the past 15 years.  Fecal occult blood test (FOBT) of the stool. You may have this test every year starting at age 71.  Flexible sigmoidoscopy or colonoscopy. You may have a sigmoidoscopy every 5 years or a colonoscopy every 10 years starting at age 52.  Hepatitis C blood test.  Hepatitis B blood test.  Sexually transmitted disease (STD) testing.  Diabetes screening. This is done by checking your blood sugar (glucose) after you have not eaten for a while (fasting). You may have this done every 1-3 years.  Bone density scan. This is done to screen for osteoporosis. You may have this done starting at age 5.  Mammogram. This may be done every 1-2 years. Talk to your health care provider about how often you should have regular mammograms. Talk with your health care provider about your test results, treatment options, and if necessary, the need for more tests. Vaccines  Your health care provider may recommend certain vaccines, such as:  Influenza vaccine. This is recommended every year.  Tetanus, diphtheria, and acellular pertussis (Tdap, Td) vaccine. You may need a Td booster every 10 years.  Zoster vaccine. You may need this after age 66.  Pneumococcal 13-valent conjugate (PCV13) vaccine. One dose is recommended after age 95.  Pneumococcal polysaccharide (PPSV23) vaccine. One dose is recommended after age 38. Talk to your health care provider about which screenings and vaccines you need and how often you need them. This information is not intended to replace advice given to you by your health care provider.  Make sure you discuss any questions you have with your health care provider. Document Released: 10/12/2015 Document Revised: 06/04/2016 Document Reviewed: 07/17/2015 Elsevier Interactive Patient Education  2017 [place] Prevention in the Home Falls can cause injuries. They can happen to  people of all ages. There are many things you can do to make your home safe and to help prevent falls. What can I do on the outside of my home?  Regularly fix the edges of walkways and driveways and fix any cracks.  Remove anything that might make you trip as you walk through a door, such as a raised step or threshold.  Trim any bushes or trees on the path to your home.  Use bright outdoor lighting.  Clear any walking paths of anything that might make someone trip, such as rocks or tools.  Regularly check to see if handrails are loose or broken. Make sure that both sides of any steps have handrails.  Any raised decks and porches should have guardrails on the edges.  Have any leaves, snow, or ice cleared regularly.  Use sand or salt on walking paths during winter.  Clean up any spills in your garage right away. This includes oil or grease spills. What can I do in the bathroom?  Use night lights.  Install grab bars by the toilet and in the tub and shower. Do not use towel bars as grab bars.  Use non-skid mats or decals in the tub or shower.  If you need to sit down in the shower, use a plastic, non-slip stool.  Keep the floor dry. Clean up any water that spills on the floor as soon as it happens.  Remove soap buildup in the tub or shower regularly.  Attach bath mats securely with double-sided non-slip rug tape.  Do not have throw rugs and other things on the floor that can make you trip. What can I do in the bedroom?  Use night lights.  Make sure that you have a light by your bed that is easy to reach.  Do not use any sheets or blankets that are too big for your bed. They should not hang down onto the floor.  Have a firm chair that has side arms. You can use this for support while you get dressed.  Do not have throw rugs and other things on the floor that can make you trip. What can I do in the kitchen?  Clean up any spills right away.  Avoid walking on wet floors.   Keep items that you use a lot in easy-to-reach places.  If you need to reach something above you, use a strong step stool that has a grab bar.  Keep electrical cords out of the way.  Do not use floor polish or wax that makes floors slippery. If you must use wax, use non-skid floor wax.  Do not have throw rugs and other things on the floor that can make you trip. What can I do with my stairs?  Do not leave any items on the stairs.  Make sure that there are handrails on both sides of the stairs and use them. Fix handrails that are broken or loose. Make sure that handrails are as long as the stairways.  Check any carpeting to make sure that it is firmly attached to the stairs. Fix any carpet that is loose or worn.  Avoid having throw rugs at the top or bottom of the stairs. If you do have throw rugs,  attach them to the floor with carpet tape.  Make sure that you have a light switch at the top of the stairs and the bottom of the stairs. If you do not have them, ask someone to add them for you. What else can I do to help prevent falls?  Wear shoes that:  Do not have high heels.  Have rubber bottoms.  Are comfortable and fit you well.  Are closed at the toe. Do not wear sandals.  If you use a stepladder:  Make sure that it is fully opened. Do not climb a closed stepladder.  Make sure that both sides of the stepladder are locked into place.  Ask someone to hold it for you, if possible.  Clearly mark and make sure that you can see:  Any grab bars or handrails.  First and last steps.  Where the edge of each step is.  Use tools that help you move around (mobility aids) if they are needed. These include:  Canes.  Walkers.  Scooters.  Crutches.  Turn on the lights when you go into a dark area. Replace any light bulbs as soon as they burn out.  Set up your furniture so you have a clear path. Avoid moving your furniture around.  If any of your floors are uneven, fix  them.  If there are any pets around you, be aware of where they are.  Review your medicines with your doctor. Some medicines can make you feel dizzy. This can increase your chance of falling. Ask your doctor what other things that you can do to help prevent falls. This information is not intended to replace advice given to you by your health care provider. Make sure you discuss any questions you have with your health care provider. Document Released: 07/12/2009 Document Revised: 02/21/2016 Document Reviewed: 10/20/2014 Elsevier Interactive Patient Education  2017 [place]  Ordered Tdap, Hgb a1c, PNA 23, will have foot exam during the next visit. F/u Ophthalmology.

## 2019-05-05 NOTE — Progress Notes (Signed)
Subjective:   Jessica [name] is a 85 y.o. female who presents for Medicare Annual (Subsequent) preventive examination.  Cardiac Risk Factors include: advanced age (>42men, >105 women);sedentary lifestyle;hypertension;smoking/ tobacco exposure     Objective:     Vitals: BP 132/70    Pulse 75    Temp 98 F (36.7 C)    Resp 20    Ht 5\' 5"  (1.651 m)    Wt 92 lb (41.7 kg)    SpO2 93%    BMI 15.31 kg/m   Body mass index is 15.31 kg/m.  Advanced Directives 04/25/2019 03/03/2019 08/28/2017 05/28/2017 05/20/2017 05/20/2017 05/14/2017  Does Patient Have a Medical Advance Directive? Yes Yes Yes Yes Yes Yes Yes  Type of Paramedic of [place];Living will;Out of facility DNR (pink MOST or yellow form) Out of facility DNR (pink MOST or yellow form);Healthcare Power of [place];Out of facility DNR (pink MOST or yellow form) [place];Living will;Out of facility DNR (pink MOST or yellow form) Living will;Healthcare Power of [place];Out of facility DNR (pink MOST or yellow form) [place];Living will [place];Living will;Out of facility DNR (pink MOST or yellow form)  Does patient want to make changes to medical advance directive? No - Patient declined No - Patient declined No - Patient declined No - Patient declined No - Patient declined No - Patient declined No - Patient declined  Copy of [place] in Chart? Yes - validated most recent copy scanned in chart (See row information) Yes - validated most recent copy scanned in chart (See row information) Yes Yes Yes - Yes  Pre-existing out of facility DNR order (yellow form or pink MOST form) Yellow form placed in chart (order not valid for inpatient use) Yellow form placed in chart (order not valid for inpatient use);Pink MOST form placed in chart (order not valid for inpatient use) Yellow form placed in chart (order not valid for  inpatient use);Pink MOST form placed in chart (order not valid for inpatient use) Yellow form placed in chart (order not valid for inpatient use) - - Yellow form placed in chart (order not valid for inpatient use)    Tobacco Social History   Tobacco Use  Smoking Status Current Every Day Smoker   Years: 60.00   Types: Cigarettes  Smokeless Tobacco Never Used  Tobacco Comment   1 pack last 3 days-10/29/17     Ready to quit: Not Answered Counseling given: Not Answered Comment: 1 pack last 3 days-10/29/17   Clinical Intake:  Pre-visit preparation completed: Yes  Pain : No/denies pain     Nutritional Status: BMI <19  Underweight Nutritional Risks: Unintentional weight loss  How often do you need to have someone help you when you read instructions, pamphlets, or other written materials from your doctor or pharmacy?: 1 - Never What is the last grade level you completed in school?: high school  Interpreter Needed?: No  Information entered by :: Lennie Odor Cordon Gassett NP  Past Medical History:  Diagnosis Date   Alcohol abuse 11/11/2010   Alcohol abuse, unspecified    Anxiety    Atrial fibrillation ([place]) 11/11/2010   Bone infection of left hand ([place]) 12/22/2008   secondary to injury   Cancer ([place])    colon cancer-hx of chemo   Chronic airway obstruction, not elsewhere classified 10/01/2011   Confusion    COPD (chronic obstructive pulmonary disease) ([place])    Dizziness and giddiness 04/24/2005  Edema 12/03/2011   Embolism - blood clot 1996   left breast   Generalized convulsive epilepsy without mention of intractable epilepsy    GERD (gastroesophageal reflux disease)    Hemorrhoids    Hyperlipidemia    Hypertension    Insomnia, unspecified 11/11/2010   Major depressive disorder, single episode, unspecified 11/11/2010   Malignant neoplasm of stomach, unspecified site 11/11/2010   Melanoma ([place])    metastatic   Peripheral vascular disease ([place])    Seizures ([place])      Symptomatic menopausal or female climacteric states 11/11/2010   Tachycardia, unspecified    Tobacco use disorder    Urgency of urination    Past Surgical History:  Procedure Laterality Date   ABDOMINAL HYSTERECTOMY  1963   BLADDER REPAIR     breast biopsies     x2 on left, x 1 on right   c-sections  1957, 1959, 1961   x3   [place]  10/21/2012   Dr. Paulita Fujita diverticulosis, sessile polyps biopsy    heart ablation  1997   HEMORRHOID SURGERY     laser surgery to right eye  10/04/2007   left femoral artery stent  05/07/2004   melanoma removed from left groin  09/2004   melanoma removed from left leg  09/06/2004   orif left humerus fracture  03/27/2005   porta cath placement  1995   porta cath removal  1996   removal of tumor in left knee  11/15/2010   skin graft to left leg  12/05/2004   VOCAL CORD INJECTION  1996, 1997   Family History  Problem Relation Age of Onset   Cancer Mother    Breast cancer Sister    Cancer Brother    Social History   Socioeconomic History   Marital status: Widowed    Spouse name: Gwyndolyn Saxon   Number of children: 3   Years of education: college   Highest education level: Not on file  Occupational History   Occupation: Arboriculturist: RETIRED    Comment: retired  Scientist, product/process development strain: Not hard at [employer] insecurity    Worry: Never true    Inability: Never true   Transportation needs    Medical: No    Non-medical: No  Tobacco Use   Smoking status: Current Every Day Smoker    Years: 60.00    Types: Cigarettes   Smokeless tobacco: Never Used   Tobacco comment: 1 pack last 3 days-10/29/17  Substance and Sexual Activity   Alcohol use: No    Comment: quit 02-02-2010   Drug use: No   Sexual activity: Never  Lifestyle   Physical activity    Days per week: 0 days    Minutes per session: 0 min   Stress: Rather much  Relationships   Social connections     Talks on phone: Never    Gets together: Once a week    Attends religious service: Never    Active member of club or organization: No    Attends meetings of clubs or organizations: Never    Relationship status: Widowed  Other Topics Concern   Not on file  Social History Narrative   Patient is retired. Patient lives at [facility] alone.    Education- college education.   Right handed.   Caffeine- some. Two cups daily.   Widowed    Exercise no   Alcohol none  Smokes about half pack daily     Outpatient Encounter Medications as of 05/05/2019  Medication Sig   ipratropium (ATROVENT HFA) 17 MCG/ACT inhaler Inhale 2 puffs into the lungs every 6 (six) hours as needed for wheezing.   LAMICTAL 150 MG tablet Take 1 tablet (150 mg total) by mouth every morning.   LAMICTAL 200 MG tablet Take 1 tablet (200 mg total) by mouth daily.   losartan (COZAAR) 100 MG tablet TAKE ONE TABLET BY MOUTH DAILY **MUST CALL MD FOR APPOINTMENT   Magnesium Oxide 400 (240 Mg) MG TABS Take 1 tablet (400 mg total) by mouth daily.   metoprolol succinate (TOPROL-XL) 50 MG 24 hr tablet Take 1 tablet (50 mg total) by mouth daily. Take with or immediately following a meal.   zonisamide (ZONEGRAN) 100 MG capsule TAKE ONE CAPSULE BY MOUTH EVERY MORNING AND 2 EVERY NIGHT   No facility-administered encounter medications on file as of 05/05/2019.     Activities of Daily Living In your present state of health, do you have any difficulty performing the following activities: 05/05/2019  Hearing? Y  Comment speaker needs to adjusting tone of voice to be heard  Vision? N  Difficulty concentrating or making decisions? Y  Walking or climbing stairs? Y  Comment furniture walking  Dressing or bathing? N  Doing errands, shopping? Y  Comment needs reminders  Preparing Food and eating ? N  Using the Toilet? N  In the past six months, have you accidently leaked urine? N  Do you have problems with loss of bowel  control? N  Managing your Medications? Y  Managing your Finances? N  Housekeeping or managing your Housekeeping? Y  Comment resides in [place]  Some recent data might be hidden    Patient Care Team: Rutledge Selsor X, NP as PCP - General (Internal Medicine) Rochell Puett X, NP as Nurse Practitioner (Internal Medicine)    Assessment:   This is a routine wellness examination for Jessica [name].  Exercise Activities and Dietary recommendations Current Exercise Habits: Home exercise routine, Type of exercise: walking, Time (Minutes): 15, Frequency (Times/Week): 7, Weekly Exercise (Minutes/Week): 105, Intensity: Mild, Exercise limited by: respiratory conditions(s)  Goals   None     Fall Risk Fall Risk  10/06/2017 08/28/2017 06/30/2017 04/24/2017 01/17/2016  Falls in the past year? Yes No Yes No No  Comment - - - Emmi Telephone Survey: data to providers prior to load -  Number falls in past yr: 1 - 1 - -  Injury with Fall? Yes - No - -  Follow up - - Falls evaluation completed;Education provided;Falls prevention discussed - -   Is the patient's home free of loose throw rugs in walkways, pet beds, electrical cords, etc?   no      Grab bars in the bathroom? yes      Handrails on the stairs?   yes      Adequate lighting?   yes  Timed Get Up and Go performed: 5  Depression Screen PHQ 2/9 Scores 10/06/2017 08/28/2017 06/30/2017 01/17/2016  PHQ - 2 Score 6 6 6 6   PHQ- 9 Score 9 15 27 6      Cognitive Function MMSE - Mini Mental State Exam 05/05/2019 10/06/2017 08/28/2017  Not completed: (No Data) (No Data) -  Orientation to time 4 5 5   Orientation to Place 5 5 5   Registration 3 3 3   Attention/ Calculation 5 5 5   Recall 3 3 3   Language- name 2 objects 2 2  2  Language- repeat 1 1 1   Language- follow 3 step command 3 3 3   Language- read & follow direction 1 1 1   Write a sentence 0 1 1  Copy design 0 1 0  Total score 27 30 29         Immunization History  Administered Date(s) Administered    Influenza Whole 06/29/2012, 07/13/2013   Influenza-Unspecified 07/17/2014   Pneumococcal Conjugate-13 02/25/2018    Qualifies for Shingles Vaccine? Shingrix 0.21ml IM x1, then  0.7ml IM x1 2-6 month  Screening Tests Health Maintenance  Topic Date Due   FOOT EXAM  05/28/1942   OPHTHALMOLOGY EXAM  05/28/1942   TETANUS/TDAP  05/28/1953   HEMOGLOBIN A1C  12/06/2018   PNA vac Low Risk Adult (2 of 2 - PPSV23) 02/26/2019   INFLUENZA VACCINE  04/30/2019   DEXA SCAN  Completed    Cancer Screenings: Lung: Low Dose CT Chest recommended if Age 20-80 years, 30 pack-year currently smoking OR have quit w/in 15years. Patient Not qualify. Breast:  Up to date on Mammogram No Up to date of Bone Density/Dexa? Last Dexa 2010 Colorectal: none  Additional Screenings: none Hepatitis C Screening:      Plan:  Ordered Tdap, Hgb a1c, PNA 23, DEXA will have foot exam during the next visit. F/u Ophthalmology.    I have personally reviewed and noted the following in the patients chart:    Medical and social history  Use of alcohol, tobacco or illicit drugs   Current medications and supplements  Functional ability and status  Nutritional status  Physical activity  Advanced directives  List of other physicians  Hospitalizations, surgeries, and ER visits in previous 12 months  Vitals  Screenings to include cognitive, depression, and falls  Referrals and appointments  In addition, I have reviewed and discussed with patient certain preventive protocols, quality metrics, and best practice recommendations. A written personalized care plan for preventive services as well as general preventive health recommendations were provided to patient.     Laurens Matheny X Paulyne Mooty, NP  05/06/2019

## 2019-05-06 ENCOUNTER — Encounter: Payer: Self-pay | Admitting: Nurse Practitioner

## 2019-05-13 ENCOUNTER — Encounter: Payer: Self-pay | Admitting: Internal Medicine

## 2019-05-13 ENCOUNTER — Non-Acute Institutional Stay: Payer: Medicare Other | Admitting: Internal Medicine

## 2019-05-13 DIAGNOSIS — Z720 Tobacco use: Secondary | ICD-10-CM | POA: Diagnosis not present

## 2019-05-13 DIAGNOSIS — R634 Abnormal weight loss: Secondary | ICD-10-CM

## 2019-05-13 DIAGNOSIS — I10 Essential (primary) hypertension: Secondary | ICD-10-CM

## 2019-05-13 DIAGNOSIS — R197 Diarrhea, unspecified: Secondary | ICD-10-CM

## 2019-05-13 NOTE — Progress Notes (Signed)
Location:  [facility] Room Number: 802 Place of Service:  ALF [phone]) Provider:  Veleta Miners  MD  Mast, Man X, NP  Patient Care Team: Mast, Man X, NP as PCP - General (Internal Medicine) Mast, Man X, NP as Nurse Practitioner (Internal Medicine)  Extended Emergency Contact Information Primary Emergency Contact: Powers,Kristine Address: [address], [place] Phone: [phone] Relation: Daughter  Code Status:  DNR Goals of care: Advanced Directive information Advanced Directives 05/13/2019  Does Patient Have a Medical Advance Directive? Yes  Type of Paramedic of [place];Living will;Out of facility DNR (pink MOST or yellow form)  Does patient want to make changes to medical advance directive? No - Patient declined  Copy of [place] in Chart? Yes - validated most recent copy scanned in chart (See row information)  Pre-existing out of facility DNR order (yellow form or pink MOST form) Yellow form placed in chart (order not valid for inpatient use)     Chief Complaint  Patient presents with  . Acute Visit    C/o- Diarrhea    HPI:  Pt is a 85 y.o. female seen today for an acute visit for Diarrhea for past few weeks  Patient is resident of Assisted Living. Patient has h/o COPD still Smokes, Epilepsy ho Status Epilepticus, Hypertension, Hyperlipidemia, H/o Colon Cancer s/p Resection and Chemo, and h/o Chronic Diarrhea  She was seen today as recently she has been c/o worsening of her diarrhea. She says for past few weeks her diarrhea has been constant. Usually it resolves by itself. She denies any abdominal pain. No Cramps with her Loose stool. No Blood in stool. No Nocturnal Diarrhea. She is not eating well especially her Supplements as she thinks it is giving her diarrhea No Other complains.  She is very frail and has lost some more weight recently.  Continues to smoke Per records has lost almost 10 lbs in past 1 year Walks without the cane or walker. Has not had any falls recently No seizures recently She was Covid Negative tested in facility.   Past Medical History:  Diagnosis Date  . Alcohol abuse 11/11/2010  . Alcohol abuse, unspecified   . Anxiety   . Atrial fibrillation ([place]) 11/11/2010  . Bone infection of left hand ([place]) 12/22/2008   secondary to injury  . Cancer ([place])    colon cancer-hx of chemo  . Chronic airway obstruction, not elsewhere classified 10/01/2011  . Confusion   . COPD (chronic obstructive pulmonary disease) ([place])   . Dizziness and giddiness 04/24/2005  . Edema 12/03/2011  . Embolism - blood clot 1996   left breast  . Generalized convulsive epilepsy without mention of intractable epilepsy   . GERD (gastroesophageal reflux disease)   . Hemorrhoids   . Hyperlipidemia   . Hypertension   . Insomnia, unspecified 11/11/2010  . Major depressive disorder, single episode, unspecified 11/11/2010  . Malignant neoplasm of stomach, unspecified site 11/11/2010  . Melanoma ([place])    metastatic  . Peripheral vascular disease ([place])   . Seizures ([place])   . Symptomatic menopausal or female climacteric states 11/11/2010  . Tachycardia, unspecified   . Tobacco use disorder   . Urgency of urination    Past Surgical History:  Procedure Laterality Date  . ABDOMINAL HYSTERECTOMY  1963  . BLADDER REPAIR    . breast biopsies     x2  on left, x 1 on right  . c-sections  [place]   x3  . COLON RESECTION  1995  . COLONOSCOPY  10/21/2012   Dr. Paulita Fujita diverticulosis, sessile polyps biopsy   . heart ablation  1997  . HEMORRHOID SURGERY    . laser surgery to right eye  10/04/2007  . left femoral artery stent  05/07/2004  . melanoma removed from left groin  09/2004  . melanoma removed from left leg  09/06/2004  . orif left humerus fracture  03/27/2005  . porta cath placement  1995  . porta cath removal  1996  . removal of tumor  in left knee  11/15/2010  . skin graft to left leg  12/05/2004  . VOCAL CORD INJECTION  1996, 1997    Allergies  Allergen Reactions  . Aspirin Nausea And Vomiting  . Effexor [Venlafaxine Hcl]     Seizure  . Keflex [Cephalexin]   . Levaquin [Levofloxacin]   . Venlafaxine Other (See Comments)    seizures  . Penicillins Rash    Has patient had a PCN reaction causing immediate rash, facial/tongue/throat swelling, SOB or lightheadedness with hypotension: Yes Has patient had a PCN reaction causing severe rash involving mucus membranes or skin necrosis: No Has patient had a PCN reaction that required hospitalization: No Has patient had a PCN reaction occurring within the last 10 years: No If all of the above answers are "NO", then may proceed with Cephalosporin use.   Azucena Fallen [Azithromycin] Rash    Outpatient Encounter Medications as of 05/13/2019  Medication Sig  . ipratropium (ATROVENT HFA) 17 MCG/ACT inhaler Inhale 2 puffs into the lungs every 6 (six) hours as needed for wheezing.  Marland Kitchen LAMICTAL 150 MG tablet Take 1 tablet (150 mg total) by mouth every morning.  Marland Kitchen LAMICTAL 200 MG tablet Take 1 tablet (200 mg total) by mouth daily.  Marland Kitchen loperamide (IMODIUM A-D) 2 MG tablet Take 2 mg by mouth daily as needed for diarrhea or loose stools. Administer (2) tabs po at onset of diarrhea then (1) tab after each diarrheal episode. Less than (8) tabs in 24hrs.  . losartan (COZAAR) 100 MG tablet TAKE ONE TABLET BY MOUTH DAILY **MUST CALL MD FOR APPOINTMENT  . Magnesium Oxide 400 (240 Mg) MG TABS Take 1 tablet (400 mg total) by mouth daily.  . metoprolol succinate (TOPROL-XL) 50 MG 24 hr tablet Take 1 tablet (50 mg total) by mouth daily. Take with or immediately following a meal.  . zonisamide (ZONEGRAN) 100 MG capsule Take 100 mg by mouth daily. Take 1-2 tablets daily  . [DISCONTINUED] zonisamide (ZONEGRAN) 100 MG capsule TAKE ONE CAPSULE BY MOUTH EVERY MORNING AND 2 EVERY NIGHT   No  facility-administered encounter medications on file as of 05/13/2019.     Review of Systems  Constitutional: Positive for appetite change.  HENT: Negative.   Respiratory: Positive for cough.   Cardiovascular: Negative.   Gastrointestinal: Positive for diarrhea. Negative for abdominal pain and nausea.  Genitourinary: Negative.   Musculoskeletal: Negative.   Neurological: Negative.   Psychiatric/Behavioral: Negative.   All other systems reviewed and are negative.   Immunization History  Administered Date(s) Administered  . Influenza Whole 06/29/2012, 07/13/2013  . Influenza-Unspecified 07/17/2014  . Pneumococcal Conjugate-13 02/25/2018   Pertinent  Health Maintenance Due  Topic Date Due  . FOOT EXAM  05/28/1942  . OPHTHALMOLOGY EXAM  05/28/1942  . HEMOGLOBIN A1C  12/06/2018  . PNA vac Low Risk Adult (2 of 2 -  PPSV23) 02/26/2019  . INFLUENZA VACCINE  04/30/2019  . DEXA SCAN  Completed   Fall Risk  10/06/2017 08/28/2017 06/30/2017 04/24/2017 01/17/2016  Falls in the past year? Yes No Yes No No  Comment - - - Emmi Telephone Survey: data to providers prior to load -  Number falls in past yr: 1 - 1 - -  Injury with Fall? Yes - No - -  Follow up - - Falls evaluation completed;Education provided;Falls prevention discussed - -   Functional Status Survey:    Vitals:   05/13/19 1157  BP: (!) 160/80  Pulse: 70  Resp: 20  Temp: 98 F (36.7 C)  SpO2: 94%  Weight: 88 lb 3.2 oz (40 kg)  Height: 5\' 4"  (1.626 m)   Body mass index is 15.14 kg/m. Physical Exam Vitals signs reviewed.  Constitutional:      Appearance: She is underweight.  HENT:     Head: Normocephalic.     Nose: Nose normal.     Mouth/Throat:     Mouth: Mucous membranes are moist.     Pharynx: Oropharynx is clear.  Eyes:     Pupils: Pupils are equal, round, and reactive to light.  Neck:     Musculoskeletal: Neck supple.  Cardiovascular:     Rate and Rhythm: Regular rhythm. Tachycardia present.     Pulses:  Normal pulses.     Heart sounds: Normal heart sounds.  Pulmonary:     Effort: Pulmonary effort is normal.     Breath sounds: Normal breath sounds.  Abdominal:     General: Abdomen is flat. Bowel sounds are normal. There is no distension.     Palpations: Abdomen is soft.     Tenderness: There is no abdominal tenderness. There is no guarding.  Musculoskeletal:        General: No swelling.  Skin:    General: Skin is warm and dry.  Neurological:     General: No focal deficit present.     Mental Status: She is alert and oriented to person, place, and time.  Psychiatric:        Mood and Affect: Mood normal.        Thought Content: Thought content normal.        Judgment: Judgment normal.     Labs reviewed: Recent Labs    08/17/18 1007 11/28/18 1019 11/28/18 1130 12/13/18 0000 01/27/19 03/08/19  NA 141 140  --  139 141 139  K 3.6 2.8*  --  4.7 4.3 4.2  CL 108 105  --  107  --   --   CO2 25 27  --  25  --   --   GLUCOSE 111* 113*  --  104*  --   --   BUN 21 23  --  20 26* 17  CREATININE 0.85 1.23*  --  1.18* 1.2* 1.3*  CALCIUM 8.6* 9.0  --  9.6  --   --   MG  --   --  1.3*  --   --   --    Recent Labs    08/17/18 1007 11/28/18 1019 12/13/18 0000 01/27/19 03/08/19  AST 19 13* 14 11* 14  ALT 16 9 7  6* 8  ALKPHOS 82 81  --  76  --   BILITOT 0.7 0.5 0.3  --   --   PROT 7.2 7.2 6.9  --   --   ALBUMIN 3.9 3.8  --   --   --  Recent Labs    06/07/18 0000 08/17/18 1007 11/28/18 1019 01/27/19 03/08/19  WBC 7.9 10.6* 6.6 6.6  --   NEUTROABS 4,258  --  5.3  --   --   HGB 14.2 13.0 13.1 11.4* 14.1  HCT 42.2 41.6 42.7 35* 43  MCV 86.1 92.9 92.4  --   --   PLT 283 187 256 251 302   Lab Results  Component Value Date   TSH 2.67 06/07/2018   Lab Results  Component Value Date   HGBA1C 5.7 (H) 06/07/2018   Lab Results  Component Value Date   CHOL 207 (H) 06/07/2018   HDL 68 06/07/2018   LDLCALC 121 (H) 06/07/2018   TRIG 81 06/07/2018   CHOLHDL 3.0 06/07/2018     Significant Diagnostic Results in last 30 days:  No results found.  Assessment/Plan Diarrhea with Recently Worsening Taking Immodium PRn Stool for C Diff was negative Her CMP and CBC Was Normal Will order Stool for WBC and GI pathogen ? On Magnesium Will checK Mag  level and TSH Will need GI referal. She says she is scared to go due to Covid will discuss again once her other Labs are back Weight Loss with h/o Smoking She needs further work up  Right now she is worried about Covid Exposure and does not have Ride to go for testing  Hypetension BP usually controlled on Metoprolol will continue to follow Seizures  Has been controlled on Zonegran and Lamictal Follows with Neurology COPD Stable on Atrovent    Family/ staff Communication:   Labs/tests ordered:  TSH,Magnesium Level, Stoolf or Occult and WBC, Also For Gastro Pathogen  Total time spent in this patient care encounter was  45_  minutes; greater than 50% of the visit spent counseling patient and staff, reviewing records , Labs and coordinating care for problems addressed at this encounter.

## 2019-05-17 DIAGNOSIS — R197 Diarrhea, unspecified: Secondary | ICD-10-CM | POA: Diagnosis not present

## 2019-05-17 DIAGNOSIS — Z8342 Family history of familial hypercholesterolemia: Secondary | ICD-10-CM | POA: Diagnosis not present

## 2019-05-17 LAB — TSH: TSH: 2.21 (ref 0.41–5.90)

## 2019-05-20 ENCOUNTER — Encounter: Payer: Self-pay | Admitting: Internal Medicine

## 2019-05-20 ENCOUNTER — Non-Acute Institutional Stay: Payer: Medicare Other | Admitting: Internal Medicine

## 2019-05-20 ENCOUNTER — Telehealth: Payer: Self-pay | Admitting: Gastroenterology

## 2019-05-20 DIAGNOSIS — R634 Abnormal weight loss: Secondary | ICD-10-CM | POA: Diagnosis not present

## 2019-05-20 DIAGNOSIS — I10 Essential (primary) hypertension: Secondary | ICD-10-CM

## 2019-05-20 DIAGNOSIS — R197 Diarrhea, unspecified: Secondary | ICD-10-CM

## 2019-05-20 DIAGNOSIS — Z720 Tobacco use: Secondary | ICD-10-CM | POA: Diagnosis not present

## 2019-05-20 NOTE — Progress Notes (Signed)
Location:  [facility] Room Number: 802 Place of Service:  ALF [phone]) Provider:  Veleta Miners  MD  Mast, Man X, NP  Patient Care Team: Mast, Man X, NP as PCP - General (Internal Medicine) Mast, Man X, NP as Nurse Practitioner (Internal Medicine)  Extended Emergency Contact Information Primary Emergency Contact: Powers,Kristine Address: [address] [place], [place] Phone: [phone] Relation: Daughter  Code Status:  DNR Goals of care: Advanced Directive information Advanced Directives 05/13/2019  Does Patient Have a Medical Advance Directive? Yes  Type of Paramedic of [place];Living will;Out of facility DNR (pink MOST or yellow form)  Does patient want to make changes to medical advance directive? No - Patient declined  Copy of [place] in Chart? Yes - validated most recent copy scanned in chart (See row information)  Pre-existing out of facility DNR order (yellow form or pink MOST form) Yellow form placed in chart (order not valid for inpatient use)     Chief Complaint  Patient presents with  . Acute Visit    C/o -f/u diarrhea    HPI:  Pt is a 85 y.o. female seen today for an acute visit for Follow up of her Diarrhea and Weight loss Patient is resident of Assisted Living. Patient has h/o COPD still Smokes, Epilepsy ho Status Epilepticus, Hypertension, Hyperlipidemia, H/o Colon Cancer s/p Resection and Chemo, and h/o Chronic Diarrhea, H/o Melanomas S/P Resection  Patient was seen  a week ago as she was c/o Worsening diarrhea.  She was saying for past few weeks her diarrhea has been constant. Usually it resolves by itself. She denied any abdominal pain. No Cramps with her Loose stool. No Blood in stool. No Nocturnal Diarrhea. She is not eating well especially her Supplements as she thinks it is giving her diarrhea Her C Diff was negative. All her Labs are  normal including Hgb, Magnesium and Potassium. TSH was normal. Occult stool is negative Today she says her symptoms are better due to Imodium.  She refuse to let us do any more stool study. She continues to have poor appetite and wants to now see GI   Past Medical History:  Diagnosis Date  . Alcohol abuse 11/11/2010  . Alcohol abuse, unspecified   . Anxiety   . Atrial fibrillation ([place]) 11/11/2010  . Bone infection of left hand ([place]) 12/22/2008   secondary to injury  . Cancer ([place])    colon cancer-hx of chemo  . Chronic airway obstruction, not elsewhere classified 10/01/2011  . Confusion   . COPD (chronic obstructive pulmonary disease) ([place])   . Dizziness and giddiness 04/24/2005  . Edema 12/03/2011  . Embolism - blood clot 1996   left breast  . Generalized convulsive epilepsy without mention of intractable epilepsy   . GERD (gastroesophageal reflux disease)   . Hemorrhoids   . Hyperlipidemia   . Hypertension   . Insomnia, unspecified 11/11/2010  . Major depressive disorder, single episode, unspecified 11/11/2010  . Malignant neoplasm of stomach, unspecified site 11/11/2010  . Melanoma ([place])    metastatic  . Peripheral vascular disease ([place])   . Seizures ([place])   . Symptomatic menopausal or female climacteric states 11/11/2010  . Tachycardia, unspecified   . Tobacco use disorder   . Urgency of urination    Past Surgical History:  Procedure Laterality Date  . ABDOMINAL HYSTERECTOMY  1963  . BLADDER  REPAIR    . breast biopsies     x2 on left, x 1 on right  . c-sections  1957, 1959, 1961   x3  . COLON RESECTION  1995  . COLONOSCOPY  10/21/2012   Dr. Paulita Fujita diverticulosis, sessile polyps biopsy   . heart ablation  1997  . HEMORRHOID SURGERY    . laser surgery to right eye  10/04/2007  . left femoral artery stent  05/07/2004  . melanoma removed from left groin  09/2004  . melanoma removed from left leg  09/06/2004  . orif left humerus fracture  03/27/2005  . porta cath placement  1995   . porta cath removal  1996  . removal of tumor in left knee  11/15/2010  . skin graft to left leg  12/05/2004  . VOCAL CORD INJECTION  1996, 1997    Allergies  Allergen Reactions  . Aspirin Nausea And Vomiting  . Effexor [Venlafaxine Hcl]     Seizure  . Keflex [Cephalexin]   . Levaquin [Levofloxacin]   . Venlafaxine Other (See Comments)    seizures  . Penicillins Rash    Has patient had a PCN reaction causing immediate rash, facial/tongue/throat swelling, SOB or lightheadedness with hypotension: Yes Has patient had a PCN reaction causing severe rash involving mucus membranes or skin necrosis: No Has patient had a PCN reaction that required hospitalization: No Has patient had a PCN reaction occurring within the last 10 years: No If all of the above answers are "NO", then may proceed with Cephalosporin use.   Azucena Fallen [Azithromycin] Rash    Outpatient Encounter Medications as of 05/20/2019  Medication Sig  . ipratropium (ATROVENT HFA) 17 MCG/ACT inhaler Inhale 2 puffs into the lungs every 6 (six) hours as needed for wheezing.  . lactose free nutrition (BOOST) LIQD Take 237 mLs by mouth daily.  Marland Kitchen LAMICTAL 150 MG tablet Take 1 tablet (150 mg total) by mouth every morning.  Marland Kitchen LAMICTAL 200 MG tablet Take 1 tablet (200 mg total) by mouth daily.  Marland Kitchen loperamide (IMODIUM A-D) 2 MG tablet Take 2 mg by mouth daily as needed for diarrhea or loose stools. Administer (2) tabs po at onset of diarrhea then (1) tab after each diarrheal episode. Less than (8) tabs in 24hrs.  . losartan (COZAAR) 100 MG tablet TAKE ONE TABLET BY MOUTH DAILY **MUST CALL MD FOR APPOINTMENT  . metoprolol succinate (TOPROL-XL) 50 MG 24 hr tablet Take 1 tablet (50 mg total) by mouth daily. Take with or immediately following a meal.  . zonisamide (ZONEGRAN) 100 MG capsule Take 100 mg by mouth daily.   . [DISCONTINUED] Magnesium Oxide 400 (240 Mg) MG TABS Take 1 tablet (400 mg total) by mouth daily.   No  facility-administered encounter medications on file as of 05/20/2019.     Review of Systems  Constitutional: Positive for appetite change and unexpected weight change.  HENT: Negative.   Respiratory: Positive for cough and shortness of breath.   Cardiovascular: Positive for leg swelling.  Gastrointestinal: Positive for diarrhea.  Genitourinary: Negative.   Musculoskeletal: Negative.   Neurological: Negative.   Psychiatric/Behavioral: Positive for dysphoric mood.    Immunization History  Administered Date(s) Administered  . Influenza Whole 06/29/2012, 07/13/2013  . Influenza-Unspecified 07/17/2014  . Pneumococcal Conjugate-13 02/25/2018   Pertinent  Health Maintenance Due  Topic Date Due  . FOOT EXAM  05/28/1942  . OPHTHALMOLOGY EXAM  05/28/1942  . HEMOGLOBIN A1C  12/06/2018  . PNA vac Low Risk Adult (  2 of 2 - PPSV23) 02/26/2019  . INFLUENZA VACCINE  04/30/2019  . DEXA SCAN  Completed   Fall Risk  10/06/2017 08/28/2017 06/30/2017 04/24/2017 01/17/2016  Falls in the past year? Yes No Yes No No  Comment - - - Emmi Telephone Survey: data to providers prior to load -  Number falls in past yr: 1 - 1 - -  Injury with Fall? Yes - No - -  Follow up - - Falls evaluation completed;Education provided;Falls prevention discussed - -   Functional Status Survey:    Vitals:   05/20/19 1423  BP: 130/60  Pulse: 80  Resp: (!) 24  Temp: (!) 97.2 F (36.2 C)  SpO2: 96%  Weight: 88 lb 3.2 oz (40 kg)  Height: 5\' 4"  (1.626 m)   Body mass index is 15.14 kg/m. Physical Exam Vitals signs reviewed.  HENT:     Head: Normocephalic.     Nose: Nose normal.     Mouth/Throat:     Mouth: Mucous membranes are moist.     Pharynx: Oropharynx is clear.  Eyes:     Pupils: Pupils are equal, round, and reactive to light.  Neck:     Musculoskeletal: Neck supple.  Cardiovascular:     Rate and Rhythm: Normal rate and regular rhythm.     Pulses: Normal pulses.     Heart sounds: Normal heart sounds.   Pulmonary:     Effort: Pulmonary effort is normal.     Breath sounds: Normal breath sounds.  Abdominal:     General: Abdomen is flat. Bowel sounds are normal.     Palpations: Abdomen is soft.  Musculoskeletal:        General: Swelling present.  Skin:    General: Skin is warm and dry.  Neurological:     General: No focal deficit present.     Mental Status: She is alert and oriented to person, place, and time.  Psychiatric:        Mood and Affect: Mood normal.        Thought Content: Thought content normal.     Labs reviewed: Recent Labs    08/17/18 1007 11/28/18 1019 11/28/18 1130 12/13/18 0000 01/27/19 03/08/19  NA 141 140  --  139 141 139  K 3.6 2.8*  --  4.7 4.3 4.2  CL 108 105  --  107  --   --   CO2 25 27  --  25  --   --   GLUCOSE 111* 113*  --  104*  --   --   BUN 21 23  --  20 26* 17  CREATININE 0.85 1.23*  --  1.18* 1.2* 1.3*  CALCIUM 8.6* 9.0  --  9.6  --   --   MG  --   --  1.3*  --   --   --    Recent Labs    08/17/18 1007 11/28/18 1019 12/13/18 0000 01/27/19 03/08/19  AST 19 13* 14 11* 14  ALT 16 9 7  6* 8  ALKPHOS 82 81  --  76  --   BILITOT 0.7 0.5 0.3  --   --   PROT 7.2 7.2 6.9  --   --   ALBUMIN 3.9 3.8  --   --   --    Recent Labs    06/07/18 0000 08/17/18 1007 11/28/18 1019 01/27/19 03/08/19  WBC 7.9 10.6* 6.6 6.6  --   NEUTROABS 4,258  --  5.3  --   --  HGB 14.2 13.0 13.1 11.4* 14.1  HCT 42.2 41.6 42.7 35* 43  MCV 86.1 92.9 92.4  --   --   PLT 283 187 256 251 302   Lab Results  Component Value Date   TSH 2.67 06/07/2018   Lab Results  Component Value Date   HGBA1C 5.7 (H) 06/07/2018   Lab Results  Component Value Date   CHOL 207 (H) 06/07/2018   HDL 68 06/07/2018   LDLCALC 121 (H) 06/07/2018   TRIG 81 06/07/2018   CHOLHDL 3.0 06/07/2018    Significant Diagnostic Results in last 30 days:  No results found.  Assessment/Plan Diarrhea with Recently Worsening Taking Immodium Prn Also h/o Weight Loss. Poor Appetite.  Continues to Smoke Stool for C Diff is negative Her CMP and CBC are  Normal Magnesium Normal. TSH is Normal Occult Negative Discontinue Mag Supplement Patient does not let us do any more stool study Wants to follow up with GI Referral Made Hypetension BP usually controlled on Metoprolol Seizures  Has been controlled on Zonegran and Lamictal Follows with Neurology COPD Stable on Atrovent  Family/ staff Communication:   Labs/tests ordered:   Total time spent in this patient care encounter was  25_  minutes; greater than 50% of the visit spent counseling patient and staff, reviewing records , Labs and coordinating care for problems addressed at this encounter.

## 2019-05-20 NOTE — Telephone Encounter (Signed)
DOD May 20, 2019  Dr. Lyndel Safe, there is a referral from Dr. Veleta Miners for consult for diarrhea and weight loss.  Records from [facility] will be sent to you for review.

## 2019-05-27 NOTE — Telephone Encounter (Signed)
I believe this is Dr. Erlinda Hong patient. Can you please find out Thx RG

## 2019-05-30 NOTE — Telephone Encounter (Signed)
Faxed back to [facility] to refer pt to [facility].

## 2019-06-02 DIAGNOSIS — F332 Major depressive disorder, recurrent severe without psychotic features: Secondary | ICD-10-CM | POA: Diagnosis not present

## 2019-06-14 ENCOUNTER — Encounter: Payer: Self-pay | Admitting: Internal Medicine

## 2019-06-14 ENCOUNTER — Encounter: Payer: Self-pay | Admitting: *Deleted

## 2019-06-14 LAB — CHLORIDE
Albumin: 3.8
Calcium: 9.5
Carbon Dioxide, Total: 28
Chloride: 105
EGFR (Non-African Amer.): 35
Globulin: 2.6
Total Protein: 6.4 g/dL

## 2019-06-14 NOTE — Progress Notes (Signed)
Location:  [facility] Room Number: 802 Place of Service:  ALF [phone]) Provider: Veleta Miners  MD  Virgie Dad, MD  Patient Care Team: Virgie Dad, MD as PCP - General (Internal Medicine) Mast, Man X, NP as Nurse Practitioner (Internal Medicine)  Extended Emergency Contact Information Primary Emergency Contact: Powers,Kristine Address: [address] [place], [place] Phone: [phone] Relation: Daughter  Code Status:  DNR Goals of care: Advanced Directive information Advanced Directives 06/14/2019  Does Patient Have a Medical Advance Directive? Yes  Type of Paramedic of [place];Out of facility DNR (pink MOST or yellow form);Living will  Does patient want to make changes to medical advance directive? No - Patient declined  Copy of [place] in Chart? Yes - validated most recent copy scanned in chart (See row information)  Pre-existing out of facility DNR order (yellow form or pink MOST form) Yellow form placed in chart (order not valid for inpatient use);Pink MOST form placed in chart (order not valid for inpatient use)     Chief Complaint  Patient presents with  . Medical Management of Chronic Issues    HPI:  Pt is a 85 y.o. female seen today for medical management of chronic diseases.     Past Medical History:  Diagnosis Date  . Alcohol abuse 11/11/2010  . Alcohol abuse, unspecified   . Anxiety   . Atrial fibrillation ([place]) 11/11/2010  . Bone infection of left hand ([place]) 12/22/2008   secondary to injury  . Cancer ([place])    colon cancer-hx of chemo  . Chronic airway obstruction, not elsewhere classified 10/01/2011  . Confusion   . COPD (chronic obstructive pulmonary disease) ([place])   . Dizziness and giddiness 04/24/2005  . Edema 12/03/2011  . Embolism - blood clot 1996   left breast  . Generalized convulsive epilepsy without mention of intractable  epilepsy   . GERD (gastroesophageal reflux disease)   . Hemorrhoids   . Hyperlipidemia   . Hypertension   . Insomnia, unspecified 11/11/2010  . Major depressive disorder, single episode, unspecified 11/11/2010  . Malignant neoplasm of stomach, unspecified site 11/11/2010  . Melanoma ([place])    metastatic  . Peripheral vascular disease ([place])   . Seizures ([place])   . Symptomatic menopausal or female climacteric states 11/11/2010  . Tachycardia, unspecified   . Tobacco use disorder   . Urgency of urination    Past Surgical History:  Procedure Laterality Date  . ABDOMINAL HYSTERECTOMY  1963  . BLADDER REPAIR    . breast biopsies     x2 on left, x 1 on right  . c-sections  1957, 1959, 1961   x3  . COLON RESECTION  1995  . COLONOSCOPY  10/21/2012   Dr. Paulita Fujita diverticulosis, sessile polyps biopsy   . heart ablation  1997  . HEMORRHOID SURGERY    . laser surgery to right eye  10/04/2007  . left femoral artery stent  05/07/2004  . melanoma removed from left groin  09/2004  . melanoma removed from left leg  09/06/2004  . orif left humerus fracture  03/27/2005  . porta cath placement  1995  . porta cath removal  1996  . removal of tumor in left knee  11/15/2010  . skin graft to left leg  12/05/2004  . VOCAL CORD INJECTION  1996, 1997    Allergies  Allergen Reactions  .  Aspirin Nausea And Vomiting  . Effexor [Venlafaxine Hcl]     Seizure  . Keflex [Cephalexin]   . Levaquin [Levofloxacin]   . Venlafaxine Other (See Comments)    seizures  . Penicillins Rash    Has patient had a PCN reaction causing immediate rash, facial/tongue/throat swelling, SOB or lightheadedness with hypotension: Yes Has patient had a PCN reaction causing severe rash involving mucus membranes or skin necrosis: No Has patient had a PCN reaction that required hospitalization: No Has patient had a PCN reaction occurring within the last 10 years: No If all of the above answers are "NO", then may proceed with Cephalosporin  use.   Azucena Fallen [Azithromycin] Rash    Outpatient Encounter Medications as of 06/14/2019  Medication Sig  . ipratropium (ATROVENT HFA) 17 MCG/ACT inhaler Inhale 2 puffs into the lungs every 6 (six) hours as needed for wheezing.  . lactose free nutrition (BOOST) LIQD Take 237 mLs by mouth daily.  Marland Kitchen LAMICTAL 150 MG tablet Take 1 tablet (150 mg total) by mouth every morning.  Marland Kitchen LAMICTAL 200 MG tablet Take 1 tablet (200 mg total) by mouth daily.  Marland Kitchen loperamide (IMODIUM A-D) 2 MG tablet Take 2 mg by mouth daily as needed for diarrhea or loose stools. Administer (2) tabs po at onset of diarrhea then (1) tab after each diarrheal episode. Less than (8) tabs in 24hrs.  . losartan (COZAAR) 100 MG tablet TAKE ONE TABLET BY MOUTH DAILY **MUST CALL MD FOR APPOINTMENT  . metoprolol succinate (TOPROL-XL) 50 MG 24 hr tablet Take 1 tablet (50 mg total) by mouth daily. Take with or immediately following a meal.  . zonisamide (ZONEGRAN) 100 MG capsule Take one tablet  in the morning  And 2 tablets in the evening.   No facility-administered encounter medications on file as of 06/14/2019.     Review of Systems  Immunization History  Administered Date(s) Administered  . Influenza Whole 06/29/2012, 07/13/2013  . Influenza-Unspecified 07/17/2014  . Pneumococcal Conjugate-13 02/25/2018  . Tdap 05/05/2019  . Zoster Recombinat (Shingrix) 05/05/2019   Pertinent  Health Maintenance Due  Topic Date Due  . FOOT EXAM  05/28/1942  . OPHTHALMOLOGY EXAM  05/28/1942  . HEMOGLOBIN A1C  12/06/2018  . INFLUENZA VACCINE  04/30/2019  . DEXA SCAN  Completed  . PNA vac Low Risk Adult  Completed   Fall Risk  10/06/2017 08/28/2017 06/30/2017 04/24/2017 01/17/2016  Falls in the past year? Yes No Yes No No  Comment - - - Emmi Telephone Survey: data to providers prior to load -  Number falls in past yr: 1 - 1 - -  Injury with Fall? Yes - No - -  Follow up - - Falls evaluation completed;Education provided;Falls prevention  discussed - -   Functional Status Survey:    Vitals:   06/14/19 0856  BP: 128/68  Pulse: 74  Resp: 18  Temp: 97.8 F (36.6 C)  SpO2: 95%  Weight: 89 lb 3.2 oz (40.5 kg)  Height: 5\' 4"  (1.626 m)   Body mass index is 15.31 kg/m. Physical Exam  Labs reviewed: Recent Labs    08/17/18 1007 11/28/18 1019 11/28/18 1130 12/13/18 0000 01/27/19 03/08/19 04/26/19  NA 141 140  --  139 141 139 139  K 3.6 2.8*  --  4.7 4.3 4.2 4.1  CL 108 105  --  107  --   --  105  CO2 25 27  --  25  --   --  28  GLUCOSE 111* 113*  --  104*  --   --   --   BUN 21 23  --  20 26* 17 18  CREATININE 0.85 1.23*  --  1.18* 1.2* 1.3* 1.4*  CALCIUM 8.6* 9.0  --  9.6  --   --  9.5  MG  --   --  1.3*  --   --   --   --    Recent Labs    08/17/18 1007 11/28/18 1019 12/13/18 0000 01/27/19 03/08/19 04/26/19  AST 19 13* 14 11* 14 15  ALT 16 9 7  6* 8 7  ALKPHOS 82 81  --  76  --  76  BILITOT 0.7 0.5 0.3  --   --   --   PROT 7.2 7.2 6.9  --   --  6.4  ALBUMIN 3.9 3.8  --   --   --  3.8   Recent Labs    08/17/18 1007 11/28/18 1019 01/27/19 03/08/19 04/26/19  WBC 10.6* 6.6 6.6  --  5.4  NEUTROABS  --  5.3  --   --   --   HGB 13.0 13.1 11.4* 14.1 12.8  HCT 41.6 42.7 35* 43 40  MCV 92.9 92.4  --   --   --   PLT 187 256 251 302 233   Lab Results  Component Value Date   TSH 2.67 06/07/2018   Lab Results  Component Value Date   HGBA1C 5.7 (H) 06/07/2018   Lab Results  Component Value Date   CHOL 207 (H) 06/07/2018   HDL 68 06/07/2018   LDLCALC 121 (H) 06/07/2018   TRIG 81 06/07/2018   CHOLHDL 3.0 06/07/2018    Significant Diagnostic Results in last 30 days:  No results found.  Assessment/Plan There are no diagnoses linked to this encounter.   Family/ staff Communication:   Labs/tests ordered:     This encounter was created in error - please disregard.

## 2019-06-21 ENCOUNTER — Non-Acute Institutional Stay: Payer: Medicare Other | Admitting: Internal Medicine

## 2019-06-21 ENCOUNTER — Encounter: Payer: Self-pay | Admitting: Internal Medicine

## 2019-06-21 DIAGNOSIS — Z720 Tobacco use: Secondary | ICD-10-CM | POA: Diagnosis not present

## 2019-06-21 DIAGNOSIS — R197 Diarrhea, unspecified: Secondary | ICD-10-CM

## 2019-06-21 DIAGNOSIS — R569 Unspecified convulsions: Secondary | ICD-10-CM | POA: Diagnosis not present

## 2019-06-21 DIAGNOSIS — R634 Abnormal weight loss: Secondary | ICD-10-CM

## 2019-06-21 DIAGNOSIS — I10 Essential (primary) hypertension: Secondary | ICD-10-CM | POA: Diagnosis not present

## 2019-06-21 NOTE — Progress Notes (Signed)
Location:    [facility] Room Number: 802 Place of Service:  ALF [phone]) Provider:  Veleta Miners, MD  Virgie Dad, MD  Patient Care Team: Virgie Dad, MD as PCP - General (Internal Medicine) Mast, Man X, NP as Nurse Practitioner (Internal Medicine)  Extended Emergency Contact Information Primary Emergency Contact: Powers,Kristine Address: [address], [place] Phone: [phone] Relation: Daughter  Code Status:  DNR Goals of care: Advanced Directive information Advanced Directives 06/21/2019  Does Patient Have a Medical Advance Directive? Yes  Type of Advance Directive Living will  Does patient want to make changes to medical advance directive? No - Patient declined  Copy of [place] in Chart? Yes - validated most recent copy scanned in chart (See row information)  Pre-existing out of facility DNR order (yellow form or pink MOST form) Yellow form placed in chart (order not valid for inpatient use)     Chief Complaint  Patient presents with  . Medical Management of Chronic Issues    HPI:  Pt is a 85 y.o. female seen today for medical management of chronic diseases.   Patient is resident of Assisted Living. Patient has h/o COPD still Smokes, Epilepsy ho Status Epilepticus, Hypertension, Hyperlipidemia, H/o Colon Cancer s/p Resection and Chemo, and h/o Chronic Diarrhea, H/o Melanoma in 03/12 s/p Wide excision  Chronic diarrhea with weight loss Her C. difficile is negative we were unable to do anymore testing as patient refused.  She says since last few weeks  her diarrhea has stopped. She continues to try to take supplements to see if it can help her weight loss She has an appointment with her GI Dr. Paulita Fujita pending COPD Continues to smoke Symptoms are stable History of seizures Stable on Lamictal and Zonegran Follows closely with neurology Hypertension Doing well on Cozaar and Toprol   Patient did not have any other complaints today.  She is waiting for her GI input.  Continues to walk without any walker or cane.  Has not had any falls.  Her weight is staying stable  Past Medical History:  Diagnosis Date  . Alcohol abuse 11/11/2010  . Alcohol abuse, unspecified   . Anxiety   . Atrial fibrillation ([place]) 11/11/2010  . Bone infection of left hand ([place]) 12/22/2008   secondary to injury  . Cancer ([place])    colon cancer-hx of chemo  . Chronic airway obstruction, not elsewhere classified 10/01/2011  . Confusion   . COPD (chronic obstructive pulmonary disease) ([place])   . Dizziness and giddiness 04/24/2005  . Edema 12/03/2011  . Embolism - blood clot 1996   left breast  . Generalized convulsive epilepsy without mention of intractable epilepsy   . GERD (gastroesophageal reflux disease)   . Hemorrhoids   . Hyperlipidemia   . Hypertension   . Insomnia, unspecified 11/11/2010  . Major depressive disorder, single episode, unspecified 11/11/2010  . Malignant neoplasm of stomach, unspecified site 11/11/2010  . Melanoma ([place])    metastatic  . Peripheral vascular disease ([place])   . Seizures ([place])   . Symptomatic menopausal or female climacteric states 11/11/2010  . Tachycardia, unspecified   . Tobacco use disorder   . Urgency of urination    Past Surgical History:  Procedure Laterality Date  . ABDOMINAL HYSTERECTOMY  1963  . BLADDER REPAIR    . breast biopsies     x2 on left, x 1 on  right  . c-sections  [place]   x3  . COLON RESECTION  1995  . COLONOSCOPY  10/21/2012   Dr. Paulita Fujita diverticulosis, sessile polyps biopsy   . heart ablation  1997  . HEMORRHOID SURGERY    . laser surgery to right eye  10/04/2007  . left femoral artery stent  05/07/2004  . melanoma removed from left groin  09/2004  . melanoma removed from left leg  09/06/2004  . orif left humerus fracture  03/27/2005  . porta cath placement  1995  . porta cath removal  1996  . removal of tumor in left knee   11/15/2010  . skin graft to left leg  12/05/2004  . VOCAL CORD INJECTION  1996, 1997    Allergies  Allergen Reactions  . Aspirin Nausea And Vomiting  . Effexor [Venlafaxine Hcl]     Seizure  . Keflex [Cephalexin]   . Levaquin [Levofloxacin]   . Venlafaxine Other (See Comments)    seizures  . Penicillins Rash    Has patient had a PCN reaction causing immediate rash, facial/tongue/throat swelling, SOB or lightheadedness with hypotension: Yes Has patient had a PCN reaction causing severe rash involving mucus membranes or skin necrosis: No Has patient had a PCN reaction that required hospitalization: No Has patient had a PCN reaction occurring within the last 10 years: No If all of the above answers are "NO", then may proceed with Cephalosporin use.   . Zithromax [Azithromycin] Rash    Allergies as of 06/21/2019      Reactions   Aspirin Nausea And Vomiting   Effexor [venlafaxine Hcl]    Seizure   Keflex [cephalexin]    Levaquin [levofloxacin]    Venlafaxine Other (See Comments)   seizures   Penicillins Rash   Has patient had a PCN reaction causing immediate rash, facial/tongue/throat swelling, SOB or lightheadedness with hypotension: Yes Has patient had a PCN reaction causing severe rash involving mucus membranes or skin necrosis: No Has patient had a PCN reaction that required hospitalization: No Has patient had a PCN reaction occurring within the last 10 years: No If all of the above answers are "NO", then may proceed with Cephalosporin use.   Zithromax [azithromycin] Rash      Medication List       Accurate as of June 21, 2019 11:17 AM. If you have any questions, ask your nurse or doctor.        Imodium A-D 2 MG tablet Generic drug: loperamide Take 2 mg by mouth daily as needed for diarrhea or loose stools. Administer (2) tabs po at onset of diarrhea then (1) tab after each diarrheal episode. Less than (8) tabs in 24hrs.   ipratropium 17 MCG/ACT inhaler  Commonly known as: ATROVENT HFA Inhale 2 puffs into the lungs every 6 (six) hours as needed for wheezing.   lactose free nutrition Liqd Take 237 mLs by mouth daily.   LaMICtal 200 MG tablet Generic drug: lamoTRIgine Take 1 tablet (200 mg total) by mouth daily.   LaMICtal 150 MG tablet Generic drug: lamoTRIgine Take 1 tablet (150 mg total) by mouth every morning.   losartan 100 MG tablet Commonly known as: COZAAR TAKE ONE TABLET BY MOUTH DAILY **MUST CALL MD FOR APPOINTMENT   metoprolol succinate 50 MG 24 hr tablet Commonly known as: TOPROL-XL Take 1 tablet (50 mg total) by mouth daily. Take with or immediately following a meal.   zonisamide 100 MG capsule Commonly known as: ZONEGRAN Take one tablet  in the morning  And 2 tablets in the evening.       Review of Systems  Constitutional: Positive for appetite change.  HENT: Negative.   Respiratory: Positive for cough and shortness of breath.   Cardiovascular: Negative.   Gastrointestinal: Negative.   Genitourinary: Negative.   Musculoskeletal: Negative.   Skin: Negative.   Neurological: Negative.   Psychiatric/Behavioral: Negative.     Immunization History  Administered Date(s) Administered  . Influenza Whole 06/29/2012, 07/13/2013  . Influenza-Unspecified 07/17/2014  . Pneumococcal Conjugate-13 02/25/2018  . Tdap 05/05/2019  . Zoster Recombinat (Shingrix) 05/05/2019   Pertinent  Health Maintenance Due  Topic Date Due  . FOOT EXAM  05/28/1942  . OPHTHALMOLOGY EXAM  05/28/1942  . HEMOGLOBIN A1C  12/06/2018  . INFLUENZA VACCINE  04/30/2019  . DEXA SCAN  Completed  . PNA vac Low Risk Adult  Completed   Fall Risk  10/06/2017 08/28/2017 06/30/2017 04/24/2017 01/17/2016  Falls in the past year? Yes No Yes No No  Comment - - - Emmi Telephone Survey: data to providers prior to load -  Number falls in past yr: 1 - 1 - -  Injury with Fall? Yes - No - -  Follow up - - Falls evaluation completed;Education provided;Falls  prevention discussed - -   Functional Status Survey:    Vitals:   06/21/19 1111  BP: 132/82  Pulse: 70  Resp: (!) 22  Temp: 98 F (36.7 C)  SpO2: 93%  Weight: 89 lb 3.2 oz (40.5 kg)   Body mass index is 15.31 kg/m. Physical Exam  Constitutional: Oriented to person, place, and time. Well-developed and well-nourished.  HENT:  Head: Normocephalic.  Mouth/Throat: Oropharynx is clear and moist.  Eyes: Pupils are equal, round, and reactive to light.  Neck: Neck supple.  Cardiovascular: Normal rate and normal heart sounds.  No murmur heard. Pulmonary/Chest: Effort normal and breath sounds normal. No respiratory distress. No wheezes. She has no rales.  Abdominal: Soft. Bowel sounds are normal. No distension. There is no tenderness. There is no rebound.  Musculoskeletal: No edema.  Lymphadenopathy: none Neurological: Alert and oriented to person, place, and time. No Focal Deficits Skin: Skin is warm and dry.  Psychiatric: Normal mood and affect. Behavior is normal. Thought content normal.    Labs reviewed: Recent Labs    08/17/18 1007 11/28/18 1019 11/28/18 1130 12/13/18 0000 01/27/19 03/08/19 04/26/19  NA 141 140  --  139 141 139 139  K 3.6 2.8*  --  4.7 4.3 4.2 4.1  CL 108 105  --  107  --   --  105  CO2 25 27  --  25  --   --  28  GLUCOSE 111* 113*  --  104*  --   --   --   BUN 21 23  --  20 26* 17 18  CREATININE 0.85 1.23*  --  1.18* 1.2* 1.3* 1.4*  CALCIUM 8.6* 9.0  --  9.6  --   --  9.5  MG  --   --  1.3*  --   --   --   --    Recent Labs    08/17/18 1007 11/28/18 1019 12/13/18 0000 01/27/19 03/08/19 04/26/19  AST 19 13* 14 11* 14 15  ALT 16 9 7  6* 8 7  ALKPHOS 82 81  --  76  --  76  BILITOT 0.7 0.5 0.3  --   --   --   PROT 7.2 7.2  6.9  --   --  6.4  ALBUMIN 3.9 3.8  --   --   --  3.8   Recent Labs    08/17/18 1007 11/28/18 1019 01/27/19 03/08/19 04/26/19  WBC 10.6* 6.6 6.6  --  5.4  NEUTROABS  --  5.3  --   --   --   HGB 13.0 13.1 11.4* 14.1 12.8   HCT 41.6 42.7 35* 43 40  MCV 92.9 92.4  --   --   --   PLT 187 256 251 302 233   Lab Results  Component Value Date   TSH 2.67 06/07/2018   Lab Results  Component Value Date   HGBA1C 5.7 (H) 06/07/2018   Lab Results  Component Value Date   CHOL 207 (H) 06/07/2018   HDL 68 06/07/2018   LDLCALC 121 (H) 06/07/2018   TRIG 81 06/07/2018   CHOLHDL 3.0 06/07/2018    Significant Diagnostic Results in last 30 days:  No results found.  Assessment/Plan Diarrhea, unspecified type Patient states it is resolved at this time She sometimes does take Imodium as needed  Stool for C Diff is negative Her CMP and CBC are  Normal Magnesium Normal. TSH is Normal Occult Negative Loss of weight She is working with Dietary and on Supplements Weight has stabilized but if continue to loose will consider further imaging  Essential hypertension Stable on Toprol and Cozaar  Seizures (HCC) Stable on Lamictal and Zonegran  Tobacco abuse Does not want to Quit    Follow up in 4 weeks for   Next visit will discuss Hyperlipidemia and DEXA and Further Imaging for Weight loss    Family/ staff Communication:   Labs/tests ordered:    Total time spent in this patient care encounter was  25_  minutes; greater than 50% of the visit spent counseling patient and staff, reviewing records , Labs and coordinating care for problems addressed at this encounter.

## 2019-08-02 DIAGNOSIS — Z20828 Contact with and (suspected) exposure to other viral communicable diseases: Secondary | ICD-10-CM | POA: Diagnosis not present

## 2019-08-09 DIAGNOSIS — Z03818 Encounter for observation for suspected exposure to other biological agents ruled out: Secondary | ICD-10-CM | POA: Diagnosis not present

## 2019-08-16 DIAGNOSIS — Z20828 Contact with and (suspected) exposure to other viral communicable diseases: Secondary | ICD-10-CM | POA: Diagnosis not present

## 2019-09-06 IMAGING — CT CT HEAD W/O CM
2 of 4 series · 14 of 47 positions shown, 17 images · non-contrast
Comparison: 08/17/2018 CT and prior studies

CLINICAL DATA: 86-year-old female with altered level of
consciousness.

EXAM:
CT HEAD WITHOUT CONTRAST
TECHNIQUE: Contiguous axial images were obtained from the base of the skull
through the vertex without intravenous contrast.

[Series 4: thins · axial · 0.45mm/px · z∈[-65,+68]mm · 11 of 214 slices shown, 14 images]
[im 18/214  brain]
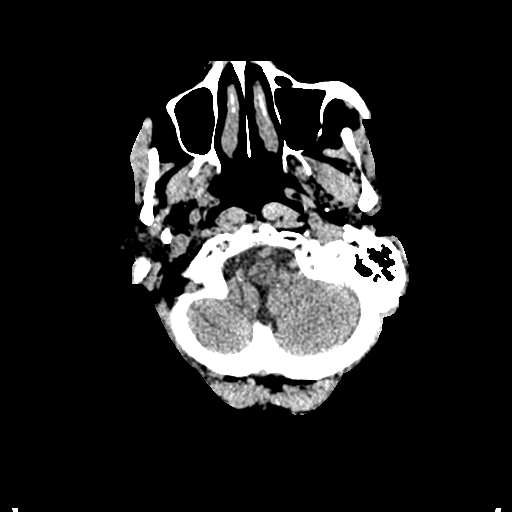
[im 18/214  bone]
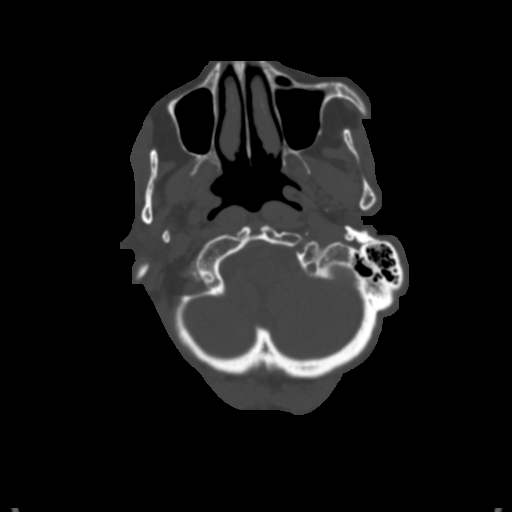
[im 35/214  brain]
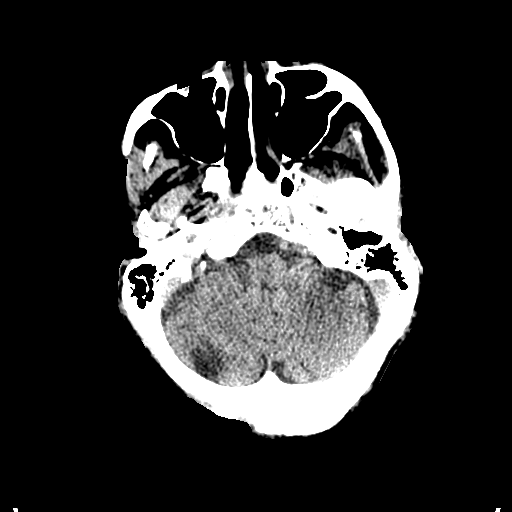
[im 52/214  brain]
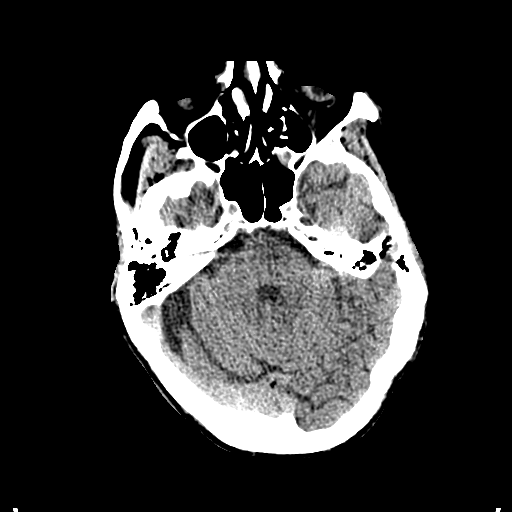
[im 69/214  brain]
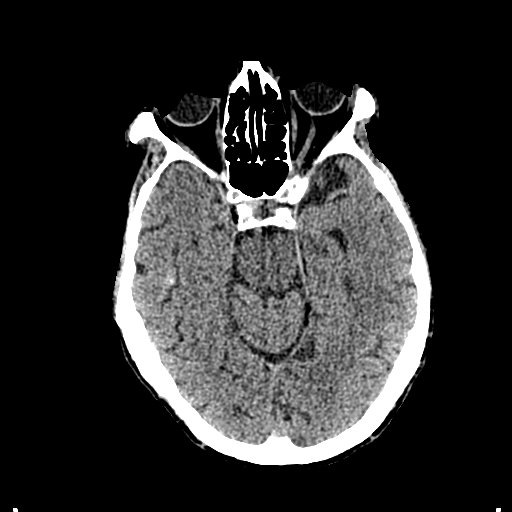
[im 86/214  brain]
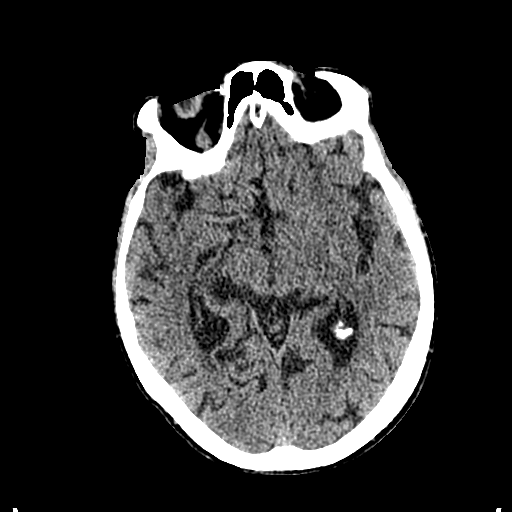
[im 86/214  bone]
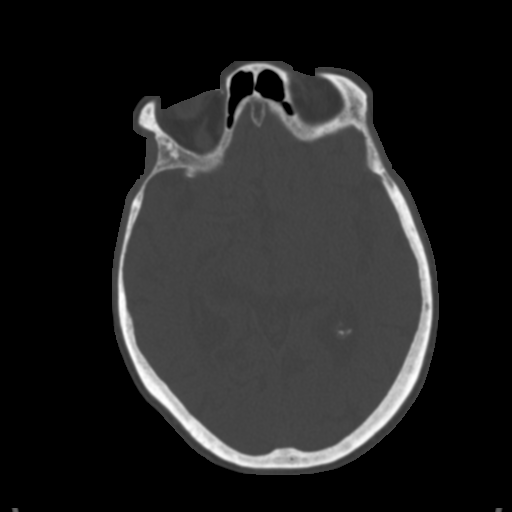
[im 111/214  brain]
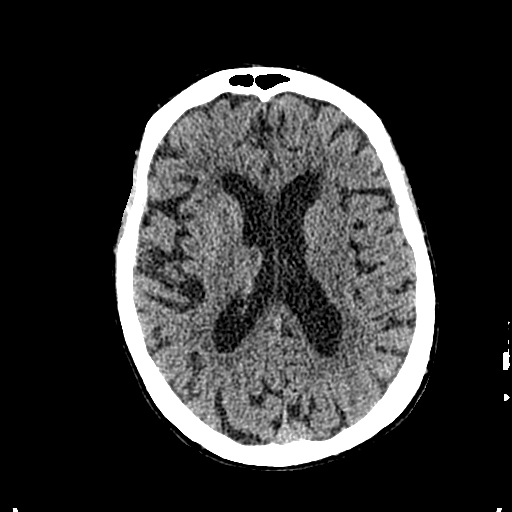
[im 128/214  brain]
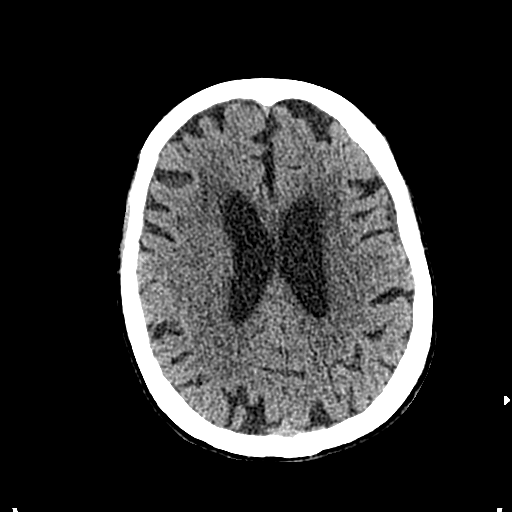
[im 145/214  brain]
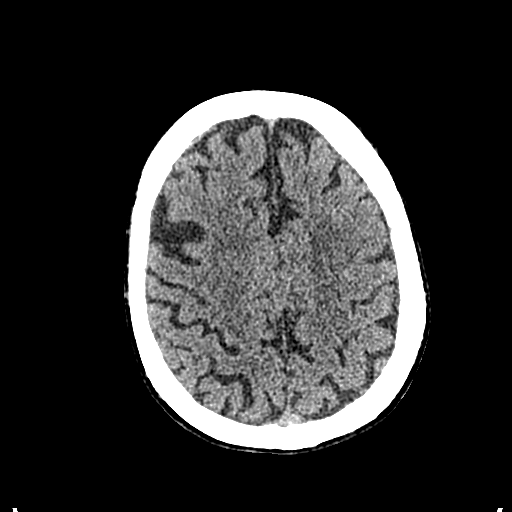
[im 162/214  brain]
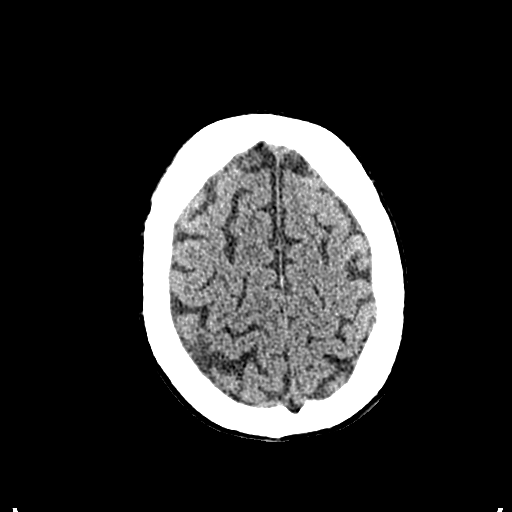
[im 162/214  bone]
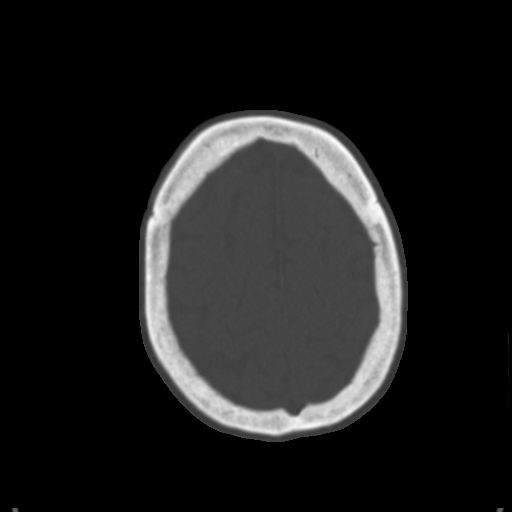
[im 179/214  brain]
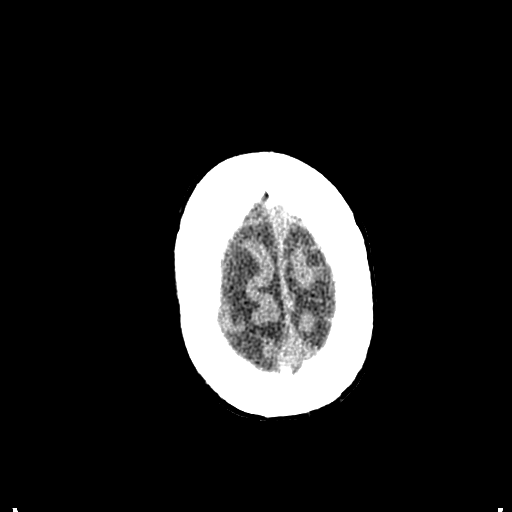
[im 196/214  brain]
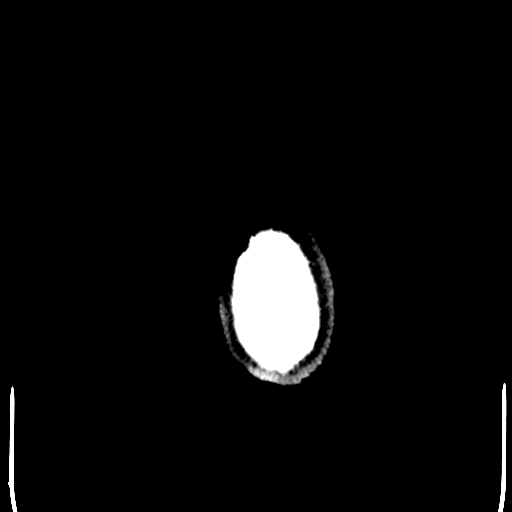

[Series 5: coronal soft tissue · coronal · 0.31mm/px · 3 of 69 slices shown]
[im 23/69  brain]
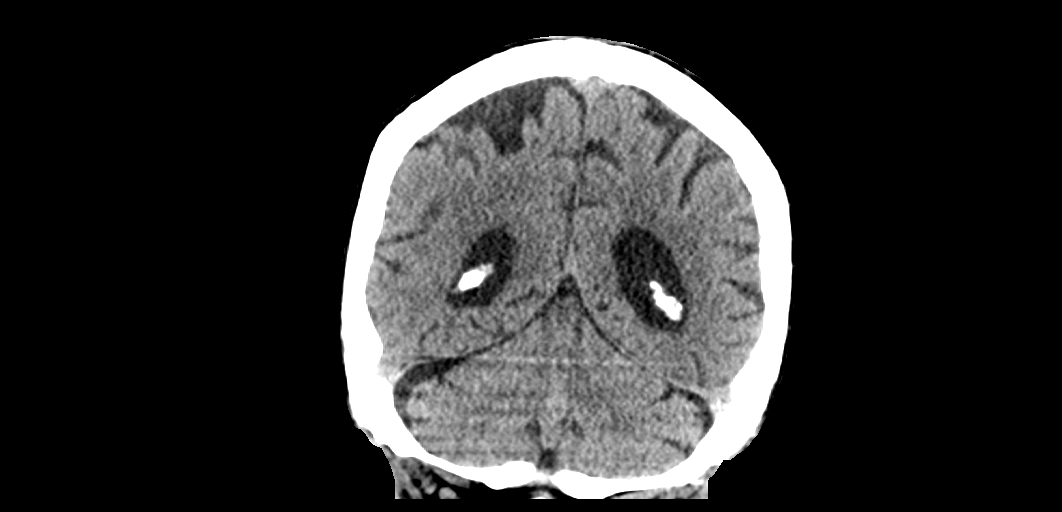
[im 31/69  brain]
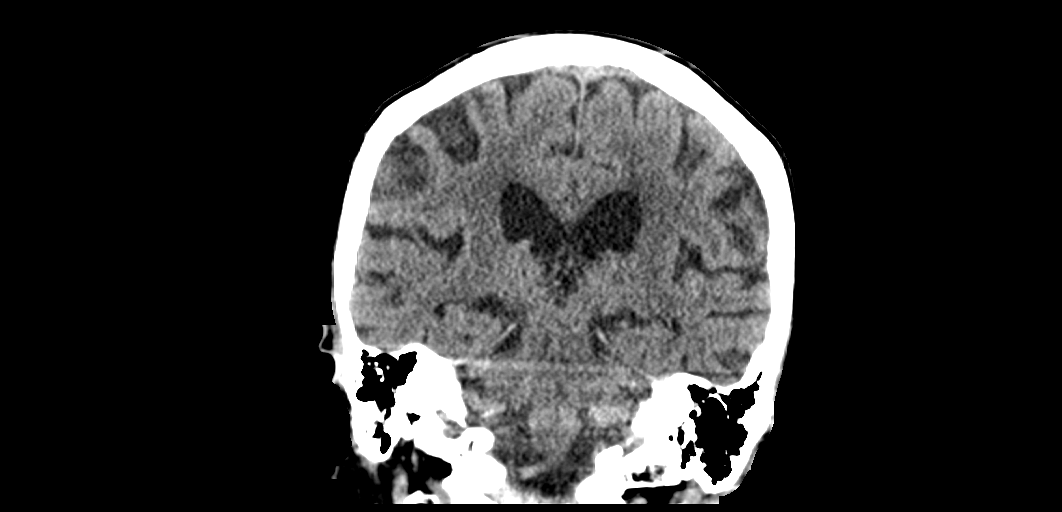
[im 38/69  brain]
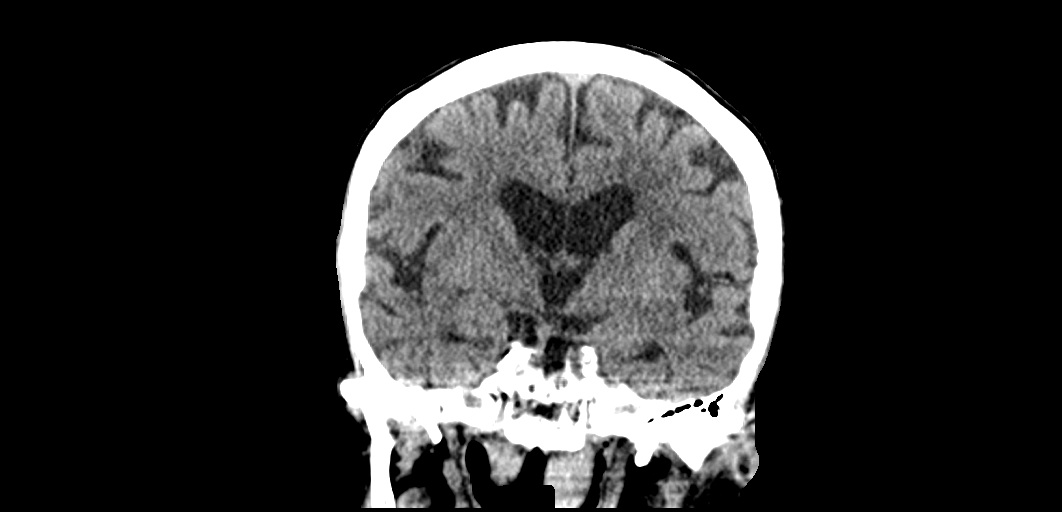

[14 of 47 positions shown; findings below may reference images not displayed]

FINDINGS: Brain: No evidence of acute infarction, hemorrhage, hydrocephalus,
extra-axial collection or mass lesion/mass effect.

Atrophy and chronic small-vessel white matter ischemic changes again
noted.

Vascular: Carotid atherosclerotic calcifications noted.

Skull: Normal. Negative for fracture or focal lesion.

Sinuses/Orbits: No acute finding.

Other: None.
IMPRESSION: 1. No evidence of acute intracranial abnormality.
2. Atrophy and chronic small-vessel white matter ischemic changes.

## 2019-09-13 DIAGNOSIS — Z20828 Contact with and (suspected) exposure to other viral communicable diseases: Secondary | ICD-10-CM | POA: Diagnosis not present

## 2019-09-19 ENCOUNTER — Encounter: Payer: Self-pay | Admitting: Nurse Practitioner

## 2019-09-19 ENCOUNTER — Non-Acute Institutional Stay: Payer: Medicare Other | Admitting: Nurse Practitioner

## 2019-09-19 DIAGNOSIS — R7303 Prediabetes: Secondary | ICD-10-CM

## 2019-09-19 DIAGNOSIS — J449 Chronic obstructive pulmonary disease, unspecified: Secondary | ICD-10-CM

## 2019-09-19 DIAGNOSIS — G40309 Generalized idiopathic epilepsy and epileptic syndromes, not intractable, without status epilepticus: Secondary | ICD-10-CM | POA: Diagnosis not present

## 2019-09-19 DIAGNOSIS — I10 Essential (primary) hypertension: Secondary | ICD-10-CM

## 2019-09-19 DIAGNOSIS — Z720 Tobacco use: Secondary | ICD-10-CM

## 2019-09-19 NOTE — Progress Notes (Signed)
Location:   [facility] Room Number: 802 Place of Service:  ALF [phone]) Provider:  Korrie Hofbauer NP  Virgie Dad, MD  Patient Care Team: Virgie Dad, MD as PCP - General (Internal Medicine) Ching Rabideau X, NP as Nurse Practitioner (Internal Medicine)  Extended Emergency Contact Information Primary Emergency Contact: Powers,Kristine Address: [address], [place] Phone: [phone] Relation: Daughter  Code Status:  DNR Goals of care: Advanced Directive information Advanced Directives 06/21/2019  Does Patient Have a Medical Advance Directive? Yes  Type of Advance Directive Living will  Does patient want to make changes to medical advance directive? No - Patient declined  Copy of [place] in Chart? Yes - validated most recent copy scanned in chart (See row information)  Pre-existing out of facility DNR order (yellow form or pink MOST form) Yellow form placed in chart (order not valid for inpatient use)     Chief Complaint  Patient presents with  . Medical Management of Chronic Issues  . Health Maintenance    Prediabetic. Eye and foot exam. Hemoglobin A1C    HPI:  Pt is a 85 y.o. female seen today for medical management of chronic diseases.    The patient [facility] in [facility] for safety, care assistance, chronic smoker, 1pk/2-3days, chronic cough. COPD chronic cough, prn HFA available to her.  Hx of seizure, stable on Zonegran 116m I am, II pm, Lamictal 2088mevery day, 15062mvery day. HTN, blood pressure is controlled, on Losartan 100m71mery day, Metoprolol 50mg55mry day.    Past Medical History:  Diagnosis Date  . Alcohol abuse 11/11/2010  . Alcohol abuse, unspecified   . Anxiety   . Atrial fibrillation (HCC) [place]3/2012  . Bone infection of left hand (HCC) [place]6/2010   secondary to injury  . [place]  . Chronic airway obstruction, not  elsewhere classified 10/01/2011  . Confusion   . COPD (chronic obstructive pulmonary disease) (HCC) [place] Dizziness and giddiness 04/24/2005  . Edema 12/03/2011  . Embolism - blood clot 1996   left breast  . Generalized convulsive epilepsy without mention of intractable epilepsy   . GERD (gastroesophageal reflux disease)   . Hemorrhoids   . Hyperlipidemia   . Hypertension   . Insomnia, unspecified 11/11/2010  . Major depressive disorder, single episode, unspecified 11/11/2010  . Malignant neoplasm of stomach, unspecified site 11/11/2010  . Melanoma (HCC[place]  . Peripheral vascular disease (HCC) [place] Seizures (HCC) [place] Symptomatic menopausal or female climacteric states 11/11/2010  . Tachycardia, unspecified   . Tobacco use disorder   . Urgency of urination    Past Surgical History:  Procedure Laterality Date  . ABDOMINAL HYSTERECTOMY  1963  . BLADDER REPAIR    . breast biopsies     x2 on left, x 1 on right  . c-sections  1957, 1959, 1961   x3  . COLON RESECTION  1995  . COLONOSCOPY  10/21/2012   Dr. OutlaPaulita Fujitarticulosis, sessile polyps biopsy   . heart ablation  1997  . HEMORRHOID SURGERY    . laser surgery to right eye  10/04/2007  . left femoral artery stent  05/07/2004  . melanoma removed from left groin  09/2004  . melanoma removed from left leg  09/06/2004  . orif left humerus fracture  03/27/2005  . porta cath placement  1995  . porta cath removal  1996  . removal of tumor in left knee  11/15/2010  . skin graft to left leg  12/05/2004  . VOCAL CORD INJECTION  1996, 1997    Allergies  Allergen Reactions  . Aspirin Nausea And Vomiting  . Effexor [Venlafaxine Hcl]     Seizure  . Keflex [Cephalexin]   . Levaquin [Levofloxacin]   . Venlafaxine Other (See Comments)    seizures  . Penicillins Rash    Has patient had a PCN reaction causing immediate rash, facial/tongue/throat swelling, SOB or lightheadedness with hypotension: Yes Has patient had a PCN reaction causing  severe rash involving mucus membranes or skin necrosis: No Has patient had a PCN reaction that required hospitalization: No Has patient had a PCN reaction occurring within the last 10 years: No If all of the above answers are "NO", then may proceed with Cephalosporin use.   . Zithromax [Azithromycin] Rash    Allergies as of 09/19/2019      Reactions   Aspirin Nausea And Vomiting   Effexor [venlafaxine Hcl]    Seizure   Keflex [cephalexin]    Levaquin [levofloxacin]    Venlafaxine Other (See Comments)   seizures   Penicillins Rash   Has patient had a PCN reaction causing immediate rash, facial/tongue/throat swelling, SOB or lightheadedness with hypotension: Yes Has patient had a PCN reaction causing severe rash involving mucus membranes or skin necrosis: No Has patient had a PCN reaction that required hospitalization: No Has patient had a PCN reaction occurring within the last 10 years: No If all of the above answers are "NO", then may proceed with Cephalosporin use.   Zithromax [azithromycin] Rash      Medication List       Accurate as of September 19, 2019 11:59 PM. If you have any questions, ask your nurse or doctor.        Imodium A-D 2 MG tablet Generic drug: loperamide Take 2 mg by mouth daily as needed for diarrhea or loose stools. Administer (2) tabs po at onset of diarrhea then (1) tab after each diarrheal episode. Less than (8) tabs in 24hrs.   ipratropium 17 MCG/ACT inhaler Commonly known as: ATROVENT HFA Inhale 2 puffs into the lungs every 6 (six) hours as needed for wheezing.   lactose free nutrition Liqd Take 237 mLs by mouth daily.   LaMICtal 200 MG tablet Generic drug: lamoTRIgine Take 1 tablet (200 mg total) by mouth daily.   LaMICtal 150 MG tablet Generic drug: lamoTRIgine Take 1 tablet (150 mg total) by mouth every morning.   losartan 100 MG tablet Commonly known as: COZAAR TAKE ONE TABLET BY MOUTH DAILY **MUST CALL MD FOR APPOINTMENT     metoprolol succinate 50 MG 24 hr tablet Commonly known as: TOPROL-XL Take 1 tablet (50 mg total) by mouth daily. Take with or immediately following a meal.   zonisamide 100 MG capsule Commonly known as: ZONEGRAN Take one tablet  in the morning  And 2 tablets in the evening.      ROS was provided with assistance of staff.  Review of Systems  Constitutional: Negative for activity change, appetite change, chills, diaphoresis, fatigue, fever and unexpected weight change.  HENT: Positive for hearing loss. Negative for congestion and voice change.   Respiratory: Positive for cough and shortness of breath. Negative for wheezing.   Cardiovascular: Positive for leg swelling. Negative for chest pain and palpitations.  Gastrointestinal: Negative  for abdominal distention, abdominal pain, constipation, diarrhea, nausea and vomiting.  Genitourinary: Negative for difficulty urinating, dysuria and urgency.  Musculoskeletal: Negative for gait problem.  Skin: Negative for color change and pallor.  Neurological: Negative for dizziness, speech difficulty, weakness and headaches.       Memory lapses.   Psychiatric/Behavioral: Negative for agitation, behavioral problems, hallucinations and sleep disturbance. The patient is not nervous/anxious.     Immunization History  Administered Date(s) Administered  . Influenza Whole 06/29/2012, 07/13/2013  . Influenza, High Dose Seasonal PF 07/02/2019  . Influenza-Unspecified 07/17/2014  . Pneumococcal Conjugate-13 02/25/2018  . Tdap 05/05/2019  . Zoster Recombinat (Shingrix) 05/05/2019   Pertinent  Health Maintenance Due  Topic Date Due  . FOOT EXAM  05/28/1942  . OPHTHALMOLOGY EXAM  05/28/1942  . HEMOGLOBIN A1C  12/06/2018  . INFLUENZA VACCINE  Completed  . DEXA SCAN  Completed  . PNA vac Low Risk Adult  Completed   Fall Risk  05/02/2019 10/06/2017 08/28/2017 06/30/2017 04/24/2017  Falls in the past year? 1 Yes No Yes No  Comment Emmi Telephone Survey: data  to providers prior to load - - - Emmi Telephone Survey: data to providers prior to load  Number falls in past yr: 1 1 - 1 -  Comment Emmi Telephone Survey Actual Response = 1 - - - -  Injury with Fall? 1 Yes - No -  Follow up - - - Falls evaluation completed;Education provided;Falls prevention discussed -   Functional Status Survey:    Vitals:   09/19/19 0958  BP: (!) 150/70  Pulse: 78  Resp: 20  Temp: 97.7 F (36.5 C)  SpO2: 94%  Weight: 91 lb 12.8 oz (41.6 kg)  Height: 5' 4" (1.626 m)   Body mass index is 15.76 kg/m. Physical Exam Vitals and nursing note reviewed.  Constitutional:      General: She is not in acute distress.    Appearance: Normal appearance. She is not ill-appearing, toxic-appearing or diaphoretic.  HENT:     Head: Normocephalic and atraumatic.     Nose: Nose normal.     Mouth/Throat:     Mouth: Mucous membranes are moist.  Eyes:     Extraocular Movements: Extraocular movements intact.     Conjunctiva/sclera: Conjunctivae normal.     Pupils: Pupils are equal, round, and reactive to light.  Cardiovascular:     Rate and Rhythm: Normal rate and regular rhythm.     Heart sounds: No murmur.  Pulmonary:     Breath sounds: No wheezing, rhonchi or rales.  Abdominal:     General: Bowel sounds are normal. There is no distension.     Palpations: Abdomen is soft.     Tenderness: There is no abdominal tenderness. There is no right CVA tenderness, guarding or rebound.  Musculoskeletal:     Cervical back: Normal range of motion and neck supple.     Left lower leg: Edema present.     Comments: Trace edema LLE  Skin:    General: Skin is warm and dry.     Comments: Left thigh melanoma surgical removal scar.   Neurological:     General: No focal deficit present.     Mental Status: She is alert and oriented to person, place, and time. Mental status is at baseline.     Motor: No weakness.     Coordination: Coordination normal.     Gait: Gait normal.    Psychiatric:        Mood and  Affect: Mood normal.        Behavior: Behavior normal.        Thought Content: Thought content normal.        Judgment: Judgment normal.     Labs reviewed: Recent Labs    11/28/18 1019 11/28/18 1130 12/13/18 0000 01/27/19 0000 03/08/19 0000 04/26/19 0000  NA 140  --  139 141 139 139  K 2.8*  --  4.7 4.3 4.2 4.1  CL 105  --  107  --   --  105  CO2 27  --  25  --   --  28  GLUCOSE 113*  --  104*  --   --   --   BUN 23  --  20 26* 17 18  CREATININE 1.23*  --  1.18* 1.2* 1.3* 1.4*  CALCIUM 9.0  --  9.6  --   --  9.5  MG  --  1.3*  --   --   --   --    Recent Labs    11/28/18 1019 12/13/18 0000 01/27/19 0000 03/08/19 0000 04/26/19 0000  AST 13* 14 11* 14 15  ALT 9 7 6* 8 7  ALKPHOS 81  --  76  --  76  BILITOT 0.5 0.3  --   --   --   PROT 7.2 6.9  --   --  6.4  ALBUMIN 3.8  --   --   --  3.8   Recent Labs    11/28/18 1019 01/27/19 0000 03/08/19 0000 04/26/19 0000  WBC 6.6 6.6  --  5.4  NEUTROABS 5.3  --   --   --   HGB 13.1 11.4* 14.1 12.8  HCT 42.7 35* 43 40  MCV 92.4  --   --   --   PLT 256 251 302 233   Lab Results  Component Value Date   TSH 2.21 05/17/2019   Lab Results  Component Value Date   HGBA1C 5.7 (H) 06/07/2018   Lab Results  Component Value Date   CHOL 207 (H) 06/07/2018   HDL 68 06/07/2018   LDLCALC 121 (H) 06/07/2018   TRIG 81 06/07/2018   CHOLHDL 3.0 06/07/2018    Significant Diagnostic Results in last 30 days:  No results found.  Assessment/Plan Prediabetes Will update Foot exam, Hgb a1c, CBC/diff, CMP/eGFR, will delay eye exam due to [place] pandemic.   Hypertension Blood pressure is controlled, continue Losartan, Metoprolol.   COPD (chronic obstructive pulmonary disease) (HCC) Chronic cough. Desires self administer HFA prn.   Generalized convulsive epilepsy ([place]) Stable, continue Lamictal, Zonegran  Tobacco abuse Down to 1pk/2-3 days, encourage smoking cessation.      Family/ staff  Communication:plan of care reviewed with the patient and charge nurse.   Labs/tests ordered:  Hgb a1c, CBC/diff, CMP/eGFR  Time spend 40 minutes.

## 2019-09-20 ENCOUNTER — Encounter: Payer: Self-pay | Admitting: Nurse Practitioner

## 2019-09-20 DIAGNOSIS — Z20828 Contact with and (suspected) exposure to other viral communicable diseases: Secondary | ICD-10-CM | POA: Diagnosis not present

## 2019-09-20 NOTE — Assessment & Plan Note (Signed)
Will update Foot exam, Hgb a1c, CBC/diff, CMP/eGFR, will delay eye exam due to [place] pandemic.

## 2019-09-20 NOTE — Assessment & Plan Note (Signed)
Down to 1pk/2-3 days, encourage smoking cessation.

## 2019-09-20 NOTE — Assessment & Plan Note (Signed)
Stable, continue Lamictal, Zonegran  

## 2019-09-20 NOTE — Assessment & Plan Note (Signed)
Blood pressure is controlled, continue Losartan, Metoprolol.  

## 2019-09-20 NOTE — Assessment & Plan Note (Addendum)
Chronic cough. Desires self administer HFA prn.

## 2019-09-27 DIAGNOSIS — I10 Essential (primary) hypertension: Secondary | ICD-10-CM | POA: Diagnosis not present

## 2019-10-12 DIAGNOSIS — Z20828 Contact with and (suspected) exposure to other viral communicable diseases: Secondary | ICD-10-CM | POA: Diagnosis not present

## 2019-10-19 DIAGNOSIS — Z20828 Contact with and (suspected) exposure to other viral communicable diseases: Secondary | ICD-10-CM | POA: Diagnosis not present

## 2019-10-26 DIAGNOSIS — Z20828 Contact with and (suspected) exposure to other viral communicable diseases: Secondary | ICD-10-CM | POA: Diagnosis not present

## 2019-11-02 DIAGNOSIS — Z20828 Contact with and (suspected) exposure to other viral communicable diseases: Secondary | ICD-10-CM | POA: Diagnosis not present

## 2019-11-09 DIAGNOSIS — Z20828 Contact with and (suspected) exposure to other viral communicable diseases: Secondary | ICD-10-CM | POA: Diagnosis not present

## 2019-11-16 DIAGNOSIS — Z20828 Contact with and (suspected) exposure to other viral communicable diseases: Secondary | ICD-10-CM | POA: Diagnosis not present

## 2019-12-20 DIAGNOSIS — I10 Essential (primary) hypertension: Secondary | ICD-10-CM | POA: Diagnosis not present

## 2019-12-20 DIAGNOSIS — E118 Type 2 diabetes mellitus with unspecified complications: Secondary | ICD-10-CM | POA: Diagnosis not present

## 2019-12-27 ENCOUNTER — Non-Acute Institutional Stay: Payer: Medicare Other | Admitting: Internal Medicine

## 2019-12-27 ENCOUNTER — Encounter: Payer: Self-pay | Admitting: Internal Medicine

## 2019-12-27 DIAGNOSIS — R7303 Prediabetes: Secondary | ICD-10-CM

## 2019-12-27 DIAGNOSIS — G40309 Generalized idiopathic epilepsy and epileptic syndromes, not intractable, without status epilepticus: Secondary | ICD-10-CM

## 2019-12-27 DIAGNOSIS — R634 Abnormal weight loss: Secondary | ICD-10-CM | POA: Diagnosis not present

## 2019-12-27 DIAGNOSIS — R197 Diarrhea, unspecified: Secondary | ICD-10-CM

## 2019-12-27 DIAGNOSIS — Z720 Tobacco use: Secondary | ICD-10-CM

## 2019-12-27 DIAGNOSIS — I10 Essential (primary) hypertension: Secondary | ICD-10-CM

## 2019-12-27 DIAGNOSIS — J449 Chronic obstructive pulmonary disease, unspecified: Secondary | ICD-10-CM | POA: Diagnosis not present

## 2019-12-27 DIAGNOSIS — K219 Gastro-esophageal reflux disease without esophagitis: Secondary | ICD-10-CM | POA: Diagnosis not present

## 2019-12-27 NOTE — Progress Notes (Signed)
Location:  [facility] Room Number: [id number] Place of Service:  ALF [phone])  Provider:   Code Status:  Goals of Care:  Advanced Directives 06/21/2019  Does Patient Have a Medical Advance Directive? Yes  Type of Advance Directive Living will  Does patient want to make changes to medical advance directive? No - Patient declined  Copy of [place] in Chart? Yes - validated most recent copy scanned in chart (See row information)  Pre-existing out of facility DNR order (yellow form or pink MOST form) Yellow form placed in chart (order not valid for inpatient use)     Chief Complaint  Patient presents with  . Medical Management of Chronic Issues    Routine Follow-up    HPI: Patient is a 86 y.o. female seen today for medical management of chronic diseases.   Patient is resident of Assisted Living. Patient has h/o COPD still Smokes, Epilepsy ho Status Epilepticus, Hypertension,Hyperlipidemia, H/o Colon Cancer s/p Resection and Chemo, and h/o Chronic Diarrhea, H/o Melanoma in 03/12 s/p Wide excision  Diarrhea No mOre is the issue Weight loss She is underweight but her weight is stabilized. Never followed with Dr Paulita Fujita COPD Continues to be smoke H/O Seizures Has not seen the Neurology recently GERD C/O Reflux like symptoms  Is stable in the facility Continues to smoke' Walks with no cane  No falls Cognition Good Is c/o Depression Past Medical History:  Diagnosis Date  . Alcohol abuse 11/11/2010  . Alcohol abuse, unspecified   . Anxiety   . Atrial fibrillation ([place]) 11/11/2010  . Bone infection of left hand ([place]) 12/22/2008   secondary to injury  . Cancer ([place])    colon cancer-hx of chemo  . Chronic airway obstruction, not elsewhere classified 10/01/2011  . Confusion   . COPD (chronic obstructive pulmonary disease) ([place])   . Dizziness and giddiness 04/24/2005  . Edema 12/03/2011  . Embolism - blood clot 1996   left breast  . Generalized  convulsive epilepsy without mention of intractable epilepsy   . GERD (gastroesophageal reflux disease)   . Hemorrhoids   . Hyperlipidemia   . Hypertension   . Insomnia, unspecified 11/11/2010  . Major depressive disorder, single episode, unspecified 11/11/2010  . Malignant neoplasm of stomach, unspecified site 11/11/2010  . Melanoma ([place])    metastatic  . Peripheral vascular disease ([place])   . Seizures ([place])   . Symptomatic menopausal or female climacteric states 11/11/2010  . Tachycardia, unspecified   . Tobacco use disorder   . Urgency of urination     Past Surgical History:  Procedure Laterality Date  . ABDOMINAL HYSTERECTOMY  1963  . BLADDER REPAIR    . breast biopsies     x2 on left, x 1 on right  . c-sections  1957, 1959, 1961   x3  . COLON RESECTION  1995  . COLONOSCOPY  10/21/2012   Dr. Paulita Fujita diverticulosis, sessile polyps biopsy   . heart ablation  1997  . HEMORRHOID SURGERY    . laser surgery to right eye  10/04/2007  . left femoral artery stent  05/07/2004  . melanoma removed from left groin  09/2004  . melanoma removed from left leg  09/06/2004  . orif left humerus fracture  03/27/2005  . porta cath placement  1995  . porta cath removal  1996  . removal of tumor in left knee  11/15/2010  . skin graft to left leg  12/05/2004  . VOCAL CORD INJECTION  1996, 1997    Allergies  Allergen Reactions  . Aspirin Nausea And Vomiting  . Effexor [Venlafaxine Hcl]     Seizure  . Keflex [Cephalexin]   . Levaquin [Levofloxacin]   . Venlafaxine Other (See Comments)    seizures  . Penicillins Rash    Has patient had a PCN reaction causing immediate rash, facial/tongue/throat swelling, SOB or lightheadedness with hypotension: Yes Has patient had a PCN reaction causing severe rash involving mucus membranes or skin necrosis: No Has patient had a PCN reaction that required hospitalization: No Has patient had a PCN reaction occurring within the last 10 years: No If all of the above  answers are "NO", then may proceed with Cephalosporin use.   Azucena Fallen [Azithromycin] Rash    Outpatient Encounter Medications as of 12/27/2019  Medication Sig  . ipratropium (ATROVENT HFA) 17 MCG/ACT inhaler Inhale 2 puffs into the lungs every 6 (six) hours as needed for wheezing.  . lactose free nutrition (BOOST) LIQD Take 237 mLs by mouth daily.  Marland Kitchen LAMICTAL 150 MG tablet Take 1 tablet (150 mg total) by mouth every morning.  Marland Kitchen LAMICTAL 200 MG tablet Take 1 tablet (200 mg total) by mouth daily.  Marland Kitchen loperamide (IMODIUM A-D) 2 MG tablet Take 2 mg by mouth daily as needed for diarrhea or loose stools. Administer (2) tabs po at onset of diarrhea then (1) tab after each diarrheal episode. Less than (8) tabs in 24hrs.  . losartan (COZAAR) 100 MG tablet TAKE ONE TABLET BY MOUTH DAILY **MUST CALL MD FOR APPOINTMENT  . metoprolol succinate (TOPROL-XL) 50 MG 24 hr tablet Take 1 tablet (50 mg total) by mouth daily. Take with or immediately following a meal.  . zonisamide (ZONEGRAN) 100 MG capsule Take one tablet  in the morning  And 2 tablets in the evening.   No facility-administered encounter medications on file as of 12/27/2019.    Review of Systems:  Review of Systems  Constitutional: Positive for appetite change.  HENT: Negative.   Respiratory: Positive for cough.   Cardiovascular: Negative.   Gastrointestinal: Negative.   Genitourinary: Negative.   Musculoskeletal: Positive for arthralgias and myalgias.  Skin: Negative.   Neurological: Negative.   Psychiatric/Behavioral: Positive for dysphoric mood.    Health Maintenance  Topic Date Due  . FOOT EXAM  Never done  . OPHTHALMOLOGY EXAM  Never done  . HEMOGLOBIN A1C  12/06/2018  . TETANUS/TDAP  05/05/2029  . INFLUENZA VACCINE  Completed  . DEXA SCAN  Completed  . PNA vac Low Risk Adult  Completed    Physical Exam: Vitals:   12/27/19 1052  BP: 140/70  Pulse: 65  Resp: 17  Temp: (!) 97.5 F (36.4 C)  SpO2: 90%  Weight: 92  lb 6.4 oz (41.9 kg)  Height: 5\' 4"  (1.626 m)   Body mass index is 15.86 kg/m. Physical Exam  Labs reviewed: Basic Metabolic Panel: Recent Labs    01/27/19 0000 03/08/19 0000 04/26/19 0000 05/17/19 0000  NA 141 139 139  --   K 4.3 4.2 4.1  --   CL  --   --  105  --   CO2  --   --  28  --   BUN 26* 17 18  --   CREATININE 1.2* 1.3* 1.4*  --   CALCIUM  --   --  9.5  --   TSH  --   --   --  2.21   Liver Function Tests: Recent Labs  01/27/19 0000 03/08/19 0000 04/26/19 0000  AST 11* 14 15  ALT 6* 8 7  ALKPHOS 76  --  76  PROT  --   --  6.4  ALBUMIN  --   --  3.8   No results for input(s): LIPASE, AMYLASE in the last 8760 hours. No results for input(s): AMMONIA in the last 8760 hours. CBC: Recent Labs    01/27/19 0000 03/08/19 0000 04/26/19 0000  WBC 6.6  --  5.4  HGB 11.4* 14.1 12.8  HCT 35* 43 40  PLT 251 302 233   Lipid Panel: No results for input(s): CHOL, HDL, LDLCALC, TRIG, CHOLHDL, LDLDIRECT in the last 8760 hours. Lab Results  Component Value Date   HGBA1C 5.7 (H) 06/07/2018    Procedures since last visit: No results found.  Assessment/Plan Prediabetes A1C 5.7 in 3/21 Will order for Eye Exam  Essential hypertension On Cozaar and Metoprolol BP stable Chronic obstructive pulmonary disease, unspecified COPD type ([place]) Continues to smoke On Atrovent Generalized convulsive epilepsy ([place]) Need to Follow with Neurology Lamictal and Zonesomide levels written Tobacco abuse Continues to Smoke Diarrhea, unspecified type Resolved Does not want to see GI for any Follow up Loss of weight Weight is stable right now Gastroesophageal reflux disease without esophagitis Wants to try Nexium Depression Says she stays Depressed but does not want any Meds DEXA scan Will start her on Calcium and Vit d   Labs/tests ordered:  BMP,CBC, Hepatic Panel, Lamitorigine level, Zonesimide level Next appt:  Visit date not found

## 2019-12-29 DIAGNOSIS — I10 Essential (primary) hypertension: Secondary | ICD-10-CM | POA: Diagnosis not present

## 2019-12-29 LAB — HEPATIC FUNCTION PANEL
ALT: 9 (ref 7–35)
AST: 14 (ref 13–35)
Alkaline Phosphatase: 82 (ref 25–125)
Bilirubin, Direct: 0.1 (ref 0.01–0.4)
Bilirubin, Total: 0.3

## 2019-12-29 LAB — COMPREHENSIVE METABOLIC PANEL
Albumin: 3.4 — AB (ref 3.5–5.0)
Calcium: 8.9 (ref 8.7–10.7)
GFR calc Af Amer: 58
GFR calc non Af Amer: 50
Globulin: 2.6

## 2019-12-29 LAB — BASIC METABOLIC PANEL
BUN: 15 (ref 4–21)
CO2: 25 — AB (ref 13–22)
Chloride: 108 (ref 99–108)
Creatinine: 1 (ref 0.5–1.1)
Glucose: 95
Potassium: 4.3 (ref 3.4–5.3)
Sodium: 140 (ref 137–147)

## 2019-12-29 LAB — CBC: RBC: 4.51 (ref 3.87–5.11)

## 2019-12-29 LAB — CBC AND DIFFERENTIAL
HCT: 41 (ref 36–46)
Hemoglobin: 13.3 (ref 12.0–16.0)
Platelets: 244 (ref 150–399)
WBC: 7.1

## 2020-01-11 ENCOUNTER — Telehealth: Payer: Self-pay | Admitting: Neurology

## 2020-01-11 NOTE — Telephone Encounter (Signed)
Pt has called asking that her LAMICTAL 125 MG tablet AND  lamoTRIgine (LAMICTAL) 200 MG tablet be faxed to the [facility] @1 [phone] 90 days refillable twice., when faxing please use pt's Patient AY:[phone]  Pt said by sending the fax to [facility] she will get the medication for free.  Pt can be reached at [phone]

## 2020-01-12 MED ORDER — LAMICTAL 200 MG PO TABS: 200.0000 mg | ORAL_TABLET | Freq: Every day | ORAL | 3 refills | Status: DC

## 2020-01-12 MED ORDER — LAMICTAL 150 MG PO TABS: 150.0000 mg | ORAL_TABLET | ORAL | 2 refills | Status: DC

## 2020-01-12 NOTE — Telephone Encounter (Signed)
Relayed to pt that prescriptions faxed to [facility] and clarified her dosing of 150mg  po daily 200mg  po bedtime. I verbalized understanding.

## 2020-01-12 NOTE — Addendum Note (Signed)
Addended by: Brandon Melnick on: 01/12/2020 09:52 AM   Modules accepted: Orders

## 2020-01-12 NOTE — Telephone Encounter (Signed)
Fax confirmation received for lamictal 150 and 200mg  tablets to [facility].

## 2020-01-26 ENCOUNTER — Other Ambulatory Visit: Payer: Self-pay | Admitting: Nurse Practitioner

## 2020-01-27 ENCOUNTER — Other Ambulatory Visit: Payer: Self-pay | Admitting: Nurse Practitioner

## 2020-01-31 DIAGNOSIS — H0015 Chalazion left lower eyelid: Secondary | ICD-10-CM | POA: Diagnosis not present

## 2020-01-31 DIAGNOSIS — H524 Presbyopia: Secondary | ICD-10-CM | POA: Diagnosis not present

## 2020-01-31 DIAGNOSIS — H02055 Trichiasis without entropian left lower eyelid: Secondary | ICD-10-CM | POA: Diagnosis not present

## 2020-02-07 ENCOUNTER — Other Ambulatory Visit: Payer: Self-pay | Admitting: Internal Medicine

## 2020-02-09 ENCOUNTER — Other Ambulatory Visit: Payer: Self-pay

## 2020-02-09 ENCOUNTER — Encounter: Payer: Self-pay | Admitting: Neurology

## 2020-02-09 ENCOUNTER — Other Ambulatory Visit: Payer: Self-pay | Admitting: Nurse Practitioner

## 2020-02-09 ENCOUNTER — Ambulatory Visit (INDEPENDENT_AMBULATORY_CARE_PROVIDER_SITE_OTHER): Payer: Medicare Other | Admitting: Neurology

## 2020-02-09 VITALS — BP 118/84 | HR 88 | Temp 97.0°F | Ht 64.0 in | Wt 95.0 lb

## 2020-02-09 DIAGNOSIS — R269 Unspecified abnormalities of gait and mobility: Secondary | ICD-10-CM | POA: Diagnosis not present

## 2020-02-09 DIAGNOSIS — G40909 Epilepsy, unspecified, not intractable, without status epilepticus: Secondary | ICD-10-CM

## 2020-02-09 MED ORDER — LAMOTRIGINE 200 MG PO TBDP: 200.0000 mg | ORAL_TABLET | Freq: Every evening | ORAL | 11 refills | Status: AC

## 2020-02-09 MED ORDER — ZONISAMIDE 100 MG PO CAPS: ORAL_CAPSULE | ORAL | 11 refills | Status: DC

## 2020-02-09 MED ORDER — LAMOTRIGINE 150 MG PO TABS: 150.0000 mg | ORAL_TABLET | Freq: Every morning | ORAL | 11 refills | Status: AC
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
## 2020-02-13 ENCOUNTER — Telehealth: Payer: Self-pay | Admitting: Neurology

## 2020-02-13 NOTE — Telephone Encounter (Signed)
[facility] called to advise if pts prescription lamoTRIgine (LAMICTAL) 150 MG tablet-------- lamoTRIgine 200 MG TBDP Could be e-prescribed to [facility], [place] [address]   Advised prescription was sent and confirmed received but pharmacy states they do not have prescription

## 2020-02-13 NOTE — Telephone Encounter (Signed)
I spoke to the April at [facility] who confirmed the prescriptions were picked up on 02/09/20. I returned the call to [facility] and spoke to [place] (caregiver). She reports the problem has been resolved and the patient has her medications.

## 2020-02-22 ENCOUNTER — Encounter: Payer: Self-pay | Admitting: Nurse Practitioner

## 2020-02-22 ENCOUNTER — Other Ambulatory Visit: Payer: Self-pay | Admitting: Nurse Practitioner

## 2020-02-22 ENCOUNTER — Non-Acute Institutional Stay: Payer: Medicare Other | Admitting: Nurse Practitioner

## 2020-02-22 ENCOUNTER — Telehealth: Payer: Self-pay | Admitting: Neurology

## 2020-02-22 DIAGNOSIS — I10 Essential (primary) hypertension: Secondary | ICD-10-CM

## 2020-02-22 DIAGNOSIS — K219 Gastro-esophageal reflux disease without esophagitis: Secondary | ICD-10-CM | POA: Diagnosis not present

## 2020-02-22 DIAGNOSIS — J449 Chronic obstructive pulmonary disease, unspecified: Secondary | ICD-10-CM

## 2020-02-22 DIAGNOSIS — N1832 Chronic kidney disease, stage 3b: Secondary | ICD-10-CM

## 2020-02-22 DIAGNOSIS — R2990 Unspecified symptoms and signs involving the nervous system: Secondary | ICD-10-CM | POA: Insufficient documentation

## 2020-02-22 DIAGNOSIS — Z87898 Personal history of other specified conditions: Secondary | ICD-10-CM

## 2020-02-22 DIAGNOSIS — G40909 Epilepsy, unspecified, not intractable, without status epilepticus: Secondary | ICD-10-CM

## 2020-02-22 LAB — BASIC METABOLIC PANEL
BUN: 22 — AB (ref 4–21)
CO2: 27 — AB (ref 13–22)
Chloride: 107 (ref 99–108)
Creatinine: 1.7 — AB (ref 0.5–1.1)
Glucose: 156
Potassium: 4.7 (ref 3.4–5.3)
Sodium: 141 (ref 137–147)

## 2020-02-22 LAB — CBC AND DIFFERENTIAL
HCT: 42 (ref 36–46)
Hemoglobin: 13.5 (ref 12.0–16.0)
Neutrophils Absolute: 5285
Platelets: 264 (ref 150–399)
WBC: 6.9

## 2020-02-22 LAB — COMPREHENSIVE METABOLIC PANEL
Albumin: 4.1 (ref 3.5–5.0)
Calcium: 9.5 (ref 8.7–10.7)
Globulin: 2.5

## 2020-02-22 LAB — HEPATIC FUNCTION PANEL
ALT: 10 (ref 7–35)
AST: 15 (ref 13–35)
Alkaline Phosphatase: 89 (ref 25–125)
Bilirubin, Total: 0.4

## 2020-02-22 LAB — CBC: RBC: 4.67 (ref 3.87–5.11)

## 2020-02-22 NOTE — Assessment & Plan Note (Signed)
Blood pressure is controlled, continue Losartan, Metoprolol.  

## 2020-02-22 NOTE — Assessment & Plan Note (Signed)
Stable, continue Esomeprazole.  

## 2020-02-22 NOTE — Telephone Encounter (Signed)
Rep with [facility] at [place] called to advise pt has been having difficulty with getting dressed and increased drooling. States they also ordered a CT of her head and the soonest was June 11th for pt to have ct done

## 2020-02-22 NOTE — Assessment & Plan Note (Signed)
Will have CT head to evaluate further, Neurology's advice was pending, update CBC/diff, CMP/eGFR stat. Observe.

## 2020-02-22 NOTE — Assessment & Plan Note (Signed)
Continue prn Atrovent HFA, encourage smoking cessation.

## 2020-02-22 NOTE — Telephone Encounter (Signed)
I have spoken with Helene Kelp who is a nurse involved with this patient's care ([facility]). The patient has been evaluated by Dr. Lyndel Safe and a CT head has been ordered. Per vo by Dr. Krista Blue, she would like lamotrigine and zonisamide levels checked. I provided this verbal order to [place]. It was followed up by a faxed request to her attention at [phone]. We also requested the results be shared with Dr. Krista Blue after Dr. Lyndel Safe gets them back. I provided fax [phone].

## 2020-02-22 NOTE — Assessment & Plan Note (Signed)
F/u neurology, continue Lamotrigine, Zonegran.

## 2020-02-22 NOTE — Progress Notes (Addendum)
Location:   [facility] Room Number: 937-T Place of Service:  ALF (13) Provider:  Takuya Lariccia, NP   Patient Care Team: Virgie Dad, MD as PCP - General (Internal Medicine) Monroe Qin X, NP as Nurse Practitioner (Internal Medicine)  Extended Emergency Contact Information Primary Emergency Contact: Powers,Kristine Address: [address], [place] Phone: [phone] Relation: Daughter  Code Status:  DNR Goals of care: Advanced Directive information Advanced Directives 02/22/2020  Does Patient Have a Medical Advance Directive? Yes  Type of Paramedic of [place];Out of facility DNR (pink MOST or yellow form);Living will  Does patient want to make changes to medical advance directive? No - Patient declined  Copy of [place] in Chart? Yes - validated most recent copy scanned in chart (See row information)  Pre-existing out of facility DNR order (yellow form or pink MOST form) -     Chief Complaint  Patient presents with  . Acute Visit    Left Finger Numbness    HPI:  Pt is a 86 y.o. female seen today for an acute visit for drooling, trouble of word finding, left hand has difficulty buttoning shirt, brushing teeth even if muscle strength is 5/5, tongue is protruding to the left, slightly left facial weakness noted, duration is uncertain, but at least over 24 hours. The patient denied headache, change of vision, chest pain/pressure, or palpitation, denied left finger numbness or tingling.   Hx of GERD, stable, on Esomeprazole 62m qd. COPD, active smoker, prn Atrovent HFA. HTN, blood pressure is controlled on Losartan 1077mqd, Metoprolol 5036md.   Hx of seizure, saw Neurology 02/09/20, Lamotrigine 150/200, Zonegran 100/200   Past Medical History:  Diagnosis Date  . Alcohol abuse 11/11/2010  . Alcohol abuse, unspecified   . Anxiety   . Atrial fibrillation  ([facility]/13/2012  . Bone infection of left hand ([facility]/26/2010   secondary to injury  . Cancer ([place]  colon cancer-hx of chemo  . Chronic airway obstruction, not elsewhere classified 10/01/2011  . Confusion   . COPD (chronic obstructive pulmonary disease) ([facility] . Dizziness and giddiness 04/24/2005  . Edema 12/03/2011  . Embolism - blood clot 1996   left breast  . Generalized convulsive epilepsy without mention of intractable epilepsy   . GERD (gastroesophageal reflux disease)   . Hemorrhoids   . Hyperlipidemia   . Hypertension   . Insomnia, unspecified 11/11/2010  . Major depressive disorder, single episode, unspecified 11/11/2010  . Malignant neoplasm of stomach, unspecified site 11/11/2010  . Melanoma ([facility]  metastatic  . Peripheral vascular disease ([facility]CC[place] . Seizures ([facility] . Symptomatic menopausal or female climacteric states 11/11/2010  . Tachycardia, unspecified   . Tobacco use disorder   . Urgency of urination    Past Surgical History:  Procedure Laterality Date  . ABDOMINAL HYSTERECTOMY  1963  . BLADDER REPAIR    . breast biopsies     x2 on left, x 1 on right  . c-sections  1957, 1959, 1961   x3  . COLON RESECTION  1995  . COLONOSCOPY  10/21/2012   Dr. OutPaulita Fujitaverticulosis, sessile polyps biopsy   . heart ablation  1997  . HEMORRHOID SURGERY    . laser surgery to right eye  10/04/2007  . left femoral artery stent  05/07/2004  . melanoma  removed from left groin  09/2004  . melanoma removed from left leg  09/06/2004  . orif left humerus fracture  03/27/2005  . porta cath placement  1995  . porta cath removal  1996  . removal of tumor in left knee  11/15/2010  . skin graft to left leg  12/05/2004  . VOCAL CORD INJECTION  1996, 1997    Allergies  Allergen Reactions  . Aspirin Nausea And Vomiting  . Effexor [Venlafaxine Hcl]     Seizure  . Keflex [Cephalexin]   . Levaquin [Levofloxacin]   . Venlafaxine Other (See Comments)    seizures  . Penicillins Rash    Has  patient had a PCN reaction causing immediate rash, facial/tongue/throat swelling, SOB or lightheadedness with hypotension: Yes Has patient had a PCN reaction causing severe rash involving mucus membranes or skin necrosis: No Has patient had a PCN reaction that required hospitalization: No Has patient had a PCN reaction occurring within the last 10 years: No If all of the above answers are "NO", then may proceed with Cephalosporin use.   . Zithromax [Azithromycin] Rash    Allergies as of 02/22/2020      Reactions   Aspirin Nausea And Vomiting   Effexor [venlafaxine Hcl]    Seizure   Keflex [cephalexin]    Levaquin [levofloxacin]    Venlafaxine Other (See Comments)   seizures   Penicillins Rash   Has patient had a PCN reaction causing immediate rash, facial/tongue/throat swelling, SOB or lightheadedness with hypotension: Yes Has patient had a PCN reaction causing severe rash involving mucus membranes or skin necrosis: No Has patient had a PCN reaction that required hospitalization: No Has patient had a PCN reaction occurring within the last 10 years: No If all of the above answers are "NO", then may proceed with Cephalosporin use.   Zithromax [azithromycin] Rash      Medication List       Accurate as of Feb 22, 2020 11:59 PM. If you have any questions, ask your nurse or doctor.        Calcium 600/Vitamin D3 600-800 MG-UNIT Tabs Generic drug: Calcium Carb-Cholecalciferol Take 1 tablet by mouth daily.   esomeprazole 40 MG capsule Commonly known as: NEXIUM Take 40 mg by mouth daily at 12 noon.   ipratropium 17 MCG/ACT inhaler Commonly known as: ATROVENT HFA Inhale 2 puffs into the lungs every 6 (six) hours as needed for wheezing.   lactose free nutrition Liqd Take 237 mLs by mouth daily as needed.   lamoTRIgine 150 MG tablet Commonly known as: LAMICTAL Take 1 tablet (150 mg total) by mouth in the morning.   lamoTRIgine 200 MG Tbdp Take 1 tablet (200 mg total) by mouth  at bedtime.   loperamide 2 MG tablet Commonly known as: IMODIUM A-D Take 2 mg by mouth as needed for diarrhea or loose stools.   loperamide 2 MG capsule Commonly known as: IMODIUM Take 2 mg by mouth daily.   LOSARTAN POTASSIUM PO Take 100 mg by mouth daily.   metoprolol succinate 50 MG 24 hr tablet Commonly known as: TOPROL-XL Take 1 tablet (50 mg total) by mouth daily. Take with or immediately following a meal.   zonisamide 100 MG capsule Commonly known as: ZONEGRAN Take 100 mg by mouth 2 (two) times daily. What changed: Another medication with the same name was removed. Continue taking this medication, and follow the directions you see here. Changed by: Lucrecia Mcphearson X Chung Chagoya, NP  Review of Systems  Constitutional: Negative for activity change, appetite change and fever.  HENT: Positive for hearing loss. Negative for congestion and voice change.   Eyes: Negative for visual disturbance.  Respiratory: Positive for cough. Negative for shortness of breath and wheezing.   Cardiovascular: Negative for chest pain, palpitations and leg swelling.  Gastrointestinal: Negative for abdominal pain, constipation, diarrhea, nausea and vomiting.  Genitourinary: Negative for difficulty urinating, dysuria and urgency.  Musculoskeletal: Negative for gait problem.  Skin: Negative for color change.  Neurological: Positive for facial asymmetry. Negative for tremors, seizures, syncope, speech difficulty, weakness, light-headedness, numbness and headaches.       Memory lapses.   Psychiatric/Behavioral: Negative for behavioral problems and sleep disturbance. The patient is not nervous/anxious.     Immunization History  Administered Date(s) Administered  . Influenza Whole 06/29/2012, 07/13/2013  . Influenza, High Dose Seasonal PF 07/02/2019  . Influenza-Unspecified 07/17/2014  . Moderna SARS-COVID-2 Vaccination 10/01/2019, 10/29/2019  . Pneumococcal Conjugate-13 02/25/2018  . Tdap 05/05/2019  .  Zoster Recombinat (Shingrix) 05/05/2019   Pertinent  Health Maintenance Due  Topic Date Due  . FOOT EXAM  Never done  . OPHTHALMOLOGY EXAM  Never done  . HEMOGLOBIN A1C  12/06/2018  . INFLUENZA VACCINE  04/29/2020  . DEXA SCAN  Completed  . PNA vac Low Risk Adult  Completed   Fall Risk  05/02/2019 10/06/2017 08/28/2017 06/30/2017 04/24/2017  Falls in the past year? 1 Yes No Yes No  Comment [facility]i Telephone Survey: data to providers prior to load - - - Emmi Telephone Survey: data to providers prior to load  Number falls in past yr: 1 1 - 1 -  Comment Emmi Telephone Survey Actual Response = 1 - - - -  Injury with Fall? 1 Yes - No -  Follow up - - - Falls evaluation completed;Education provided;Falls prevention discussed -   Functional Status Survey:    Vitals:   02/22/20 1350  BP: 122/60  Pulse: 81  Resp: 18  Temp: 97.7 F (36.5 C)  SpO2: 93%  Weight: 93 lb 6.4 oz (42.4 kg)  Height: 5' 4"  (1.626 m)   Body mass index is 16.03 kg/m. Physical Exam Vitals and nursing note reviewed.  Constitutional:      Appearance: Normal appearance.  HENT:     Head: Normocephalic and atraumatic.     Mouth/Throat:     Mouth: Mucous membranes are moist.  Eyes:     Extraocular Movements: Extraocular movements intact.     Conjunctiva/sclera: Conjunctivae normal.     Pupils: Pupils are equal, round, and reactive to light.  Cardiovascular:     Rate and Rhythm: Normal rate and regular rhythm.     Heart sounds: No murmur.  Pulmonary:     Breath sounds: No rales.  Abdominal:     General: Bowel sounds are normal.     Palpations: Abdomen is soft.     Tenderness: There is no abdominal tenderness.  Musculoskeletal:     Cervical back: Normal range of motion and neck supple.     Right lower leg: No edema.     Left lower leg: No edema.  Skin:    General: Skin is warm and dry.     Comments: Left thigh melanoma surgical removal scar.   Neurological:     General: No focal deficit present.     Mental  Status: She is alert and oriented to person, place, and time. Mental status is at baseline.  Cranial Nerves: Cranial nerve deficit present.     Motor: Weakness present.     Coordination: Coordination abnormal.     Gait: Gait normal.     Comments: Mild left facial weakness, tongue protruded to the left, left hand finger to nose is not as good as the right, reported having difficulty buttoning and brushing teeth. Word finding difficulty noted. Muscle strength 5/5 all limbs.   Psychiatric:        Mood and Affect: Mood normal.        Behavior: Behavior normal.        Thought Content: Thought content normal.        Judgment: Judgment normal.     Labs reviewed: Recent Labs    03/08/19 0000 04/26/19 0000 12/29/19 0000  NA 139 139 140  K 4.2 4.1 4.3  CL  --  105 108  CO2  --  28 25*  BUN 17 18 15   CREATININE 1.3* 1.4* 1.0  CALCIUM  --  9.5 8.9   Recent Labs    03/08/19 0000 04/26/19 0000 12/29/19 0000  AST 14 15 14   ALT 8 7 9   ALKPHOS  --  76 82  PROT  --  6.4  --   ALBUMIN  --  3.8 3.4*   Recent Labs    03/08/19 0000 04/26/19 0000 12/29/19 0000  WBC  --  5.4 7.1  HGB 14.1 12.8 13.3  HCT 43 40 41  PLT 302 233 244   Lab Results  Component Value Date   TSH 2.21 05/17/2019   Lab Results  Component Value Date   HGBA1C 5.7 (H) 06/07/2018   Lab Results  Component Value Date   CHOL 207 (H) 06/07/2018   HDL 68 06/07/2018   LDLCALC 121 (H) 06/07/2018   TRIG 81 06/07/2018   CHOLHDL 3.0 06/07/2018    Significant Diagnostic Results in last 30 days:  No results found.  Assessment/Plan Stroke-like symptom Will have CT head to evaluate further, Neurology's advice was pending, update CBC/diff, CMP/eGFR stat. Observe.   Nonintractable epilepsy without status epilepticus ([place]) F/u neurology, continue Lamotrigine, Zonegran.   GERD (gastroesophageal reflux disease) Stable, continue Esomeprazole.   COPD (chronic obstructive pulmonary disease) (HCC) Continue prn  Atrovent HFA, encourage smoking cessation.   Hypertension Blood pressure is controlled, continue Losartan, Metoprolol.   CKD (chronic kidney disease) stage 3, GFR 30-59 ml/min 12/29/19 Na 140, K 4.3, Bun 15, creat 1.02, eGFR 50, lamotrigine 11.9, wbc 7.1, Hgb 13.3, plt 244 02/22/20 Na 141, K 4.7, Bun 22, creat 1.65, eGFR 28, wbc 6.9, Hgb 13.5, plt 164, neutrophil 76.6% Encourage oral fluid intake, f/u BMP one week.     Family/ staff Communication: plan of care reviewed with the patient and charge nurse.   Labs/tests ordered:  CT head w/o CM, CBC/diff, CMP/eGFR  Time spend 40 minutes.

## 2020-02-23 DIAGNOSIS — G40909 Epilepsy, unspecified, not intractable, without status epilepticus: Secondary | ICD-10-CM | POA: Diagnosis not present

## 2020-02-23 NOTE — Assessment & Plan Note (Signed)
12/29/19 Na 140, K 4.3, Bun 15, creat 1.02, eGFR 50, lamotrigine 11.9, wbc 7.1, Hgb 13.3, plt 244 02/22/20 Na 141, K 4.7, Bun 22, creat 1.65, eGFR 28, wbc 6.9, Hgb 13.5, plt 164, neutrophil 76.6% Encourage oral fluid intake, f/u BMP one week.

## 2020-02-28 ENCOUNTER — Other Ambulatory Visit: Payer: Self-pay

## 2020-02-28 ENCOUNTER — Encounter: Payer: Self-pay | Admitting: Neurology

## 2020-02-28 ENCOUNTER — Telehealth: Payer: Self-pay | Admitting: Neurology

## 2020-02-28 ENCOUNTER — Ambulatory Visit (INDEPENDENT_AMBULATORY_CARE_PROVIDER_SITE_OTHER): Payer: Medicare Other | Admitting: Neurology

## 2020-02-28 VITALS — BP 148/87 | HR 85 | Ht 64.0 in | Wt 94.5 lb

## 2020-02-28 DIAGNOSIS — R531 Weakness: Secondary | ICD-10-CM

## 2020-02-28 DIAGNOSIS — G40909 Epilepsy, unspecified, not intractable, without status epilepticus: Secondary | ICD-10-CM

## 2020-02-28 DIAGNOSIS — R200 Anesthesia of skin: Secondary | ICD-10-CM | POA: Diagnosis not present

## 2020-02-28 LAB — BASIC METABOLIC PANEL
BUN: 18 (ref 4–21)
CO2: 29 — AB (ref 13–22)
Chloride: 105 (ref 99–108)
Creatinine: 1.3 — AB (ref 0.5–1.1)
Glucose: 124
Potassium: 4.4 (ref 3.4–5.3)
Sodium: 141 (ref 137–147)

## 2020-02-28 LAB — COMPREHENSIVE METABOLIC PANEL: Calcium: 9.8 (ref 8.7–10.7)
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
## 2020-02-28 NOTE — Telephone Encounter (Signed)
Daughter called and was informed that the order was sent to [facility] and the phone number was given to her. Daughter stated she will call them to schedule appt.

## 2020-02-28 NOTE — Telephone Encounter (Signed)
Medicare/bcbs supp order sent to [facility]. They will reach out to the patient daughter to schedule.

## 2020-02-28 NOTE — Telephone Encounter (Signed)
I returned the call to the patient at the number below. I have tried to reach her twice. It rang repeatedly with no answer or voicemail.

## 2020-02-28 NOTE — Telephone Encounter (Signed)
I have talked with her son Devona Konig [phone], updated him on current information  I also called her daughter also her [place] at [phone], left message  1.  Patient has acute onset of left facial hand numbness weakness on Feb 02, 2020, increased gait difficulty, about 10 pounds weight loss compared to a year ago, concerning about stroke involving right hemisphere, I have ordered MRI of brain, echocardiogram, ultrasound of carotid artery, suggest aspirin 81 mg daily  2.  Complex partial seizure with secondary generalization, she has been on brand-name Lamictal 150/200 mg for a while, also generic Zonegran 100/200 mg, she is struggled to keeping up the paperwork required to get the brand-name Lamictal, I have suggested equipment switch of generic lamotrigine 150/200 even higher dose of lamotrigine 200 mg twice a day, she does not want to switch    Raquel Sarna, please arrange MRI of the brain relatively quickly  Judson Roch, her family wants [place] to call her daughter or son to update her information when she was seen in our office

## 2020-02-28 NOTE — Telephone Encounter (Signed)
Medicare/bcbs supp order sent to [facility]. No auth they will reach out to the patient to schedule.  

## 2020-02-28 NOTE — Telephone Encounter (Signed)
Pt called to advise she is still having left hand numbness and cannot hold anything in her hand and requested a CB to discuss [phone]

## 2020-02-28 NOTE — Telephone Encounter (Signed)
I was able to speak with the patient. She has been having numbness in her left hand for the last week. Dr. Krista Blue is going to evaluate her in the office today.   I also called Helene Kelp (nurse at [facility]). She is going to print the patient's recent labs and send them with her today. The lamotrigine and zonisamide labs were still pending b/c they had to be sent to an outside lab for processing. She will call to check on the status of the results.

## 2020-02-29 NOTE — Telephone Encounter (Signed)
[facility] faxed the ordered lab results:  1) lamotrigine: 13.7 (wnl) 2) zonisamide: 37.0 (wnl)  The printed copy of the results will be sent for scanning once further reviewed by Dr. Krista Blue.

## 2020-02-29 NOTE — Telephone Encounter (Signed)
Noted, thank you

## 2020-03-01 ENCOUNTER — Encounter: Payer: Self-pay | Admitting: Nurse Practitioner

## 2020-03-01 ENCOUNTER — Non-Acute Institutional Stay: Payer: Medicare Other | Admitting: Nurse Practitioner

## 2020-03-01 DIAGNOSIS — F418 Other specified anxiety disorders: Secondary | ICD-10-CM

## 2020-03-01 DIAGNOSIS — I10 Essential (primary) hypertension: Secondary | ICD-10-CM

## 2020-03-01 DIAGNOSIS — R2990 Unspecified symptoms and signs involving the nervous system: Secondary | ICD-10-CM

## 2020-03-01 DIAGNOSIS — R531 Weakness: Secondary | ICD-10-CM | POA: Diagnosis not present

## 2020-03-01 DIAGNOSIS — G40309 Generalized idiopathic epilepsy and epileptic syndromes, not intractable, without status epilepticus: Secondary | ICD-10-CM

## 2020-03-01 NOTE — Assessment & Plan Note (Signed)
The patient refused anxiolytics today requested by her HPOA

## 2020-03-01 NOTE — Assessment & Plan Note (Signed)
02/28/20 Neurology acute onset of left arm weakness, numbness left arm 02/10/20,  pending MRI/CT head, Echocardiogram, ASA, US carotid artery 02/28/20 Na 141, K 4.4, Bun 18, creat 1.32, eGFR 37, lamotrigine 13.7(4.0-18.0), Zonisamide 17(10-40) The patient is working with PT for left sided weakness.  

## 2020-03-01 NOTE — Assessment & Plan Note (Signed)
Stable, f/u Neurology, continue Lamotrigine, Zonisamide.

## 2020-03-01 NOTE — Progress Notes (Signed)
Location:   [facility] Room Number: 802 Place of Service:  ALF (13) Provider: Lennie Odor Fransisca Shawn NP  Virgie Dad, MD  Patient Care Team: Virgie Dad, MD as PCP - General (Internal Medicine) Demareon Coldwell X, NP as Nurse Practitioner (Internal Medicine)  Extended Emergency Contact Information Primary Emergency Contact: Powers,Kristine Address: [address] [place], [place] Phone: [phone] Relation: Daughter  Code Status:  DNR Goals of care: Advanced Directive information Advanced Directives 02/22/2020  Does Patient Have a Medical Advance Directive? Yes  Type of Paramedic of [place];Out of facility DNR (pink MOST or yellow form);Living will  Does patient want to make changes to medical advance directive? No - Patient declined  Copy of [place] in Chart? Yes - validated most recent copy scanned in chart (See row information)  Pre-existing out of facility DNR order (yellow form or pink MOST form) -     Chief Complaint  Patient presents with  . Medical Management of Chronic Issues    HPI:  Pt is a 86 y.o. female seen today for medical management of chronic diseases.    The patient 02/28/20 Neurology acute onset of left arm weakness, numbness left arm 02/10/20,  pending MRI/CT head, ASA started Echocardiogram ,US carotid artery-not sure if pending. 02/28/20 Na 141, K 4.4, Bun 18, creat 1.32, eGFR 37, lamotrigine 13.7(4.0-18.0), Zonisamide 17(10-40). The patient is working with PT for left sided weakness.   Hx of GERD, stable, on Esomeprazole '40mg'$  qd. Seizures, stable, on Lamotrigine, Zonisamide. HTN, blood pressure is controlled, on Losartan '100mg'$  qd, Metoprolol '50mg'$  qd.     Past Medical History:  Diagnosis Date  . Alcohol abuse 11/11/2010  . Alcohol abuse, unspecified   . Anxiety   . Atrial fibrillation ([place]) 11/11/2010  . Bone infection of left hand ([place]) 12/22/2008    secondary to injury  . Cancer ([place])    colon cancer-hx of chemo  . Chronic airway obstruction, not elsewhere classified 10/01/2011  . Confusion   . COPD (chronic obstructive pulmonary disease) ([place])   . Dizziness and giddiness 04/24/2005  . Edema 12/03/2011  . Embolism - blood clot 1996   left breast  . Generalized convulsive epilepsy without mention of intractable epilepsy   . GERD (gastroesophageal reflux disease)   . Hemorrhoids   . Hyperlipidemia   . Hypertension   . Insomnia, unspecified 11/11/2010  . Major depressive disorder, single episode, unspecified 11/11/2010  . Malignant neoplasm of stomach, unspecified site 11/11/2010  . Melanoma ([place])    metastatic  . Peripheral vascular disease ([place])   . Seizures ([place])   . Symptomatic menopausal or female climacteric states 11/11/2010  . Tachycardia, unspecified   . Tobacco use disorder   . Urgency of urination    Past Surgical History:  Procedure Laterality Date  . ABDOMINAL HYSTERECTOMY  1963  . BLADDER REPAIR    . breast biopsies     x2 on left, x 1 on right  . c-sections  1957, 1959, 1961   x3  . COLON RESECTION  1995  . COLONOSCOPY  10/21/2012   Dr. Paulita Fujita diverticulosis, sessile polyps biopsy   . heart ablation  1997  . HEMORRHOID SURGERY    . laser surgery to right eye  10/04/2007  . left femoral artery stent  05/07/2004  . melanoma removed from left groin  09/2004  . melanoma removed from left  leg  09/06/2004  . orif left humerus fracture  03/27/2005  . porta cath placement  1995  . porta cath removal  1996  . removal of tumor in left knee  11/15/2010  . skin graft to left leg  12/05/2004  . VOCAL CORD INJECTION  1996, 1997    Allergies  Allergen Reactions  . Aspirin Nausea And Vomiting  . Effexor [Venlafaxine Hcl]     Seizure  . Keflex [Cephalexin]   . Levaquin [Levofloxacin]   . Venlafaxine Other (See Comments)    seizures  . Penicillins Rash    Has patient had a PCN reaction causing immediate rash,  facial/tongue/throat swelling, SOB or lightheadedness with hypotension: Yes Has patient had a PCN reaction causing severe rash involving mucus membranes or skin necrosis: No Has patient had a PCN reaction that required hospitalization: No Has patient had a PCN reaction occurring within the last 10 years: No If all of the above answers are "NO", then may proceed with Cephalosporin use.   . Zithromax [Azithromycin] Rash    Allergies as of 03/01/2020      Reactions   Aspirin Nausea And Vomiting   Effexor [venlafaxine Hcl]    Seizure   Keflex [cephalexin]    Levaquin [levofloxacin]    Venlafaxine Other (See Comments)   seizures   Penicillins Rash   Has patient had a PCN reaction causing immediate rash, facial/tongue/throat swelling, SOB or lightheadedness with hypotension: Yes Has patient had a PCN reaction causing severe rash involving mucus membranes or skin necrosis: No Has patient had a PCN reaction that required hospitalization: No Has patient had a PCN reaction occurring within the last 10 years: No If all of the above answers are "NO", then may proceed with Cephalosporin use.   Zithromax [azithromycin] Rash      Medication List       Accurate as of March 01, 2020  3:45 PM. If you have any questions, ask your nurse or doctor.        STOP taking these medications   aspirin EC 81 MG tablet Stopped by: Zephaniah Enyeart X Cornisha Zetino, NP     TAKE these medications   Calcium 600/Vitamin D3 600-800 MG-UNIT Tabs Generic drug: Calcium Carb-Cholecalciferol Take 1 tablet by mouth daily.   esomeprazole 40 MG capsule Commonly known as: NEXIUM Take 40 mg by mouth daily at 12 noon.   ipratropium 17 MCG/ACT inhaler Commonly known as: ATROVENT HFA Inhale 2 puffs into the lungs every 6 (six) hours as needed for wheezing.   lactose free nutrition Liqd Take 237 mLs by mouth daily as needed.   lamoTRIgine 150 MG tablet Commonly known as: LAMICTAL Take 1 tablet (150 mg total) by mouth in the morning.    lamoTRIgine 200 MG Tbdp Take 1 tablet (200 mg total) by mouth at bedtime.   loperamide 2 MG tablet Commonly known as: IMODIUM A-D Take 2 mg by mouth as needed for diarrhea or loose stools. What changed: Another medication with the same name was removed. Continue taking this medication, and follow the directions you see here. Changed by: Olamae Ferrara X Brentley Landfair, NP   LOSARTAN POTASSIUM PO Take 100 mg by mouth daily.   metoprolol succinate 50 MG 24 hr tablet Commonly known as: TOPROL-XL Take 1 tablet (50 mg total) by mouth daily. Take with or immediately following a meal.   zonisamide 100 MG capsule Commonly known as: ZONEGRAN Take 200 mg by mouth daily.   zonisamide 100 MG capsule Commonly known as: ZONEGRAN  Take 100 mg by mouth daily.       Review of Systems  Constitutional: Negative for activity change, fever and unexpected weight change.  HENT: Positive for hearing loss. Negative for congestion and voice change.   Eyes: Negative for visual disturbance.  Respiratory: Positive for cough. Negative for shortness of breath and wheezing.   Cardiovascular: Negative for chest pain, palpitations and leg swelling.  Gastrointestinal: Negative for abdominal pain and constipation.  Genitourinary: Negative for difficulty urinating, dysuria and urgency.  Musculoskeletal: Negative for gait problem.  Skin: Negative for color change.  Neurological: Positive for facial asymmetry, weakness and numbness. Negative for tremors, seizures, syncope, speech difficulty, light-headedness and headaches.       Memory lapses. Left facial, arm, leg weakness, left hand numbness.   Psychiatric/Behavioral: Negative for behavioral problems and sleep disturbance. The patient is not nervous/anxious.     Immunization History  Administered Date(s) Administered  . Influenza Whole 06/29/2012, 07/13/2013  . Influenza, High Dose Seasonal PF 07/02/2019  . Influenza-Unspecified 07/17/2014  . Moderna SARS-COVID-2 Vaccination  10/01/2019, 10/29/2019  . Pneumococcal Conjugate-13 02/25/2018  . Tdap 05/05/2019  . Zoster Recombinat (Shingrix) 05/05/2019   Pertinent  Health Maintenance Due  Topic Date Due  . FOOT EXAM  Never done  . OPHTHALMOLOGY EXAM  Never done  . HEMOGLOBIN A1C  12/06/2018  . INFLUENZA VACCINE  04/29/2020  . DEXA SCAN  Completed  . PNA vac Low Risk Adult  Completed   Fall Risk  05/02/2019 10/06/2017 08/28/2017 06/30/2017 04/24/2017  Falls in the past year? 1 Yes No Yes No  Comment [place] Telephone Survey: data to providers prior to load - - - [facility] Telephone Survey: data to providers prior to load  Number falls in past yr: 1 1 - 1 -  Comment [place] Telephone Survey Actual Response = 1 - - - -  Injury with Fall? 1 Yes - No -  Follow up - - - Falls evaluation completed;Education provided;Falls prevention discussed -   Functional Status Survey:    Vitals:   03/01/20 1255  BP: 122/80  Pulse: 73  Resp: 18  Temp: 98.1 F (36.7 C)  SpO2: 94%  Weight: 95 lb 3.2 oz (43.2 kg)  Height: _0  (1.626 m)   Body mass index is 16.34 kg/m. Physical Exam Vitals and nursing note reviewed.  Constitutional:      Appearance: Normal appearance.  HENT:     Head: Normocephalic and atraumatic.     Mouth/Throat:     Mouth: Mucous membranes are moist.  Eyes:     Extraocular Movements: Extraocular movements intact.     Conjunctiva/sclera: Conjunctivae normal.     Pupils: Pupils are equal, round, and reactive to light.  Cardiovascular:     Rate and Rhythm: Normal rate and regular rhythm.     Heart sounds: No murmur.  Pulmonary:     Breath sounds: No rales.  Abdominal:     General: Bowel sounds are normal.     Palpations: Abdomen is soft.     Tenderness: There is no abdominal tenderness.  Musculoskeletal:     Cervical back: Normal range of motion and neck supple.     Right lower leg: No edema.     Left lower leg: No edema.  Skin:    General: Skin is warm and dry.     Comments: Left thigh melanoma  surgical removal scar.   Neurological:     General: No focal deficit present.     Mental Status:  She is alert and oriented to person, place, and time. Mental status is at baseline.     Cranial Nerves: Cranial nerve deficit present.     Motor: Weakness present.     Coordination: Coordination abnormal.     Gait: Gait normal.     Comments: Mild left facial weakness, LUE/LLE  Muscle strength 5/5 all limbs, weaker than the right side limbs.   Psychiatric:        Mood and Affect: Mood normal.        Behavior: Behavior normal.        Thought Content: Thought content normal.        Judgment: Judgment normal.     Labs reviewed: Recent Labs    12/29/19 0000 02/22/20 0000 02/28/20 0000  NA 140 141 141  K 4.3 4.7 4.4  CL 108 107 105  CO2 25* 27* 29*  BUN 15 22* 18  CREATININE 1.0 1.7* 1.3*  CALCIUM 8.9 9.5 9.8   Recent Labs    04/26/19 0000 12/29/19 0000 02/22/20 0000  AST _0 ALT _1 ALKPHOS 76 82 89  PROT 6.4  --   --   ALBUMIN 3.8 3.4* 4.1   Recent Labs    04/26/19 0000 12/29/19 0000 02/22/20 0000  WBC 5.4 7.1 6.9  NEUTROABS  --   --  5,285  HGB 12.8 13.3 13.5  HCT 40 41 42  PLT 233 244 264   Lab Results  Component Value Date   TSH 2.21 05/17/2019   Lab Results  Component Value Date   HGBA1C 5.7 (H) 06/07/2018   Lab Results  Component Value Date   CHOL 207 (H) 06/07/2018   HDL 68 06/07/2018   LDLCALC 121 (H) 06/07/2018   TRIG 81 06/07/2018   CHOLHDL 3.0 06/07/2018    Significant Diagnostic Results in last 30 days:  No results found.  Assessment/Plan  Left-sided weakness 02/28/20 Neurology acute onset of left arm weakness, numbness left arm 02/10/20,  pending MRI/CT head, Echocardiogram, ASA, US carotid artery 02/28/20 Na 141, K 4.4, Bun 18, creat 1.32, eGFR 37, lamotrigine 13.7(4.0-18.0), Zonisamide 17(10-40) The patient is working with PT for left sided weakness.   Generalized convulsive epilepsy ([place]) Stable, f/u Neurology, continue  Lamotrigine, Zonisamide.   Hypertension Blood pressure is controlled, continue Losartan, Metoprolol.   Depression with anxiety The patient refused anxiolytics today requested by her HPOA   Family/ staff Communication: plan of care reviewed with the patient and charge nurse.   Labs/tests ordered: none  Time spend 40 minutes.

## 2020-03-01 NOTE — Assessment & Plan Note (Signed)
Blood pressure is controlled, continue Losartan, Metoprolol.  

## 2020-03-05 ENCOUNTER — Encounter: Payer: Self-pay | Admitting: *Deleted

## 2020-03-06 ENCOUNTER — Ambulatory Visit (HOSPITAL_BASED_OUTPATIENT_CLINIC_OR_DEPARTMENT_OTHER)
Admission: RE | Admit: 2020-03-06 | Discharge: 2020-03-06 | Disposition: A | Discharge status: Home / Self Care | Payer: Medicare Other | Source: Ambulatory Visit | Attending: Neurology | Admitting: Neurology

## 2020-03-06 ENCOUNTER — Telehealth: Payer: Self-pay | Admitting: Neurology

## 2020-03-06 ENCOUNTER — Other Ambulatory Visit: Payer: Self-pay

## 2020-03-06 ENCOUNTER — Ambulatory Visit (HOSPITAL_COMMUNITY)
Admission: RE | Admit: 2020-03-06 | Discharge: 2020-03-06 | Disposition: A | Discharge status: Home / Self Care | Payer: Medicare Other | Source: Ambulatory Visit | Attending: Neurology | Admitting: Neurology

## 2020-03-06 DIAGNOSIS — C7931 Secondary malignant neoplasm of brain: Secondary | ICD-10-CM | POA: Diagnosis not present

## 2020-03-06 DIAGNOSIS — R531 Weakness: Secondary | ICD-10-CM | POA: Insufficient documentation

## 2020-03-06 DIAGNOSIS — R200 Anesthesia of skin: Secondary | ICD-10-CM | POA: Insufficient documentation

## 2020-03-06 NOTE — Telephone Encounter (Signed)
Summary:  Right Carotid: Velocities in the right ICA are consistent with a 1-39%  stenosis.   Left Carotid: Velocities in the left ICA are consistent with a 1-39%  stenosis.   Vertebrals: Bilateral vertebral arteries demonstrate antegrade flow.  Subclavians: Normal flow hemodynamics were seen in bilateral subclavian        arteries.   Please call patient, ultrasound of carotid arteries showed less than 39% stenosis bilaterally, anterograde flow of bilateral vertebral artery.  Continue aspirin 81 mg daily

## 2020-03-06 NOTE — Progress Notes (Signed)
Carotid artery duplex completed. Refer to "CV Proc" under chart review to view preliminary results.  03/06/2020 2:30 PM Kelby Aline., MHA, RVT, RDCS, RDMS

## 2020-03-06 NOTE — Telephone Encounter (Signed)
I spoke to her son Ritchie Garabedian who verbalized understanding of the results below. He will also share this information with his sister.  Dr. Krista Blue has already instructed [facility] to start the patient on enteric coated aspirin 81mg , one tablet daily after meals.   These results will also be faxed to [facility] for the patient's records [phone]).

## 2020-03-07 ENCOUNTER — Inpatient Hospital Stay (HOSPITAL_COMMUNITY)
Admission: EM | Admit: 2020-03-07 | Discharge: 2020-03-13 | DRG: 054 | Disposition: A | Discharge status: Skilled Nursing Facility | Payer: Medicare Other | Source: Skilled Nursing Facility | Attending: Internal Medicine | Admitting: Internal Medicine

## 2020-03-07 ENCOUNTER — Other Ambulatory Visit: Payer: Self-pay

## 2020-03-07 ENCOUNTER — Emergency Department (HOSPITAL_COMMUNITY): Payer: Medicare Other

## 2020-03-07 ENCOUNTER — Ambulatory Visit
Admission: RE | Admit: 2020-03-07 | Discharge: 2020-03-07 | Disposition: A | Discharge status: Home / Self Care | Payer: Medicare Other | Source: Ambulatory Visit | Attending: Neurology | Admitting: Neurology

## 2020-03-07 ENCOUNTER — Encounter (HOSPITAL_COMMUNITY): Payer: Self-pay | Admitting: Emergency Medicine

## 2020-03-07 DIAGNOSIS — G9389 Other specified disorders of brain: Secondary | ICD-10-CM | POA: Diagnosis not present

## 2020-03-07 DIAGNOSIS — Z515 Encounter for palliative care: Secondary | ICD-10-CM | POA: Diagnosis not present

## 2020-03-07 DIAGNOSIS — R29701 NIHSS score 1: Secondary | ICD-10-CM | POA: Diagnosis present

## 2020-03-07 DIAGNOSIS — R630 Anorexia: Secondary | ICD-10-CM | POA: Diagnosis present

## 2020-03-07 DIAGNOSIS — F1010 Alcohol abuse, uncomplicated: Secondary | ICD-10-CM | POA: Diagnosis present

## 2020-03-07 DIAGNOSIS — I619 Nontraumatic intracerebral hemorrhage, unspecified: Secondary | ICD-10-CM | POA: Diagnosis not present

## 2020-03-07 DIAGNOSIS — R911 Solitary pulmonary nodule: Secondary | ICD-10-CM | POA: Diagnosis not present

## 2020-03-07 DIAGNOSIS — R200 Anesthesia of skin: Secondary | ICD-10-CM

## 2020-03-07 DIAGNOSIS — I160 Hypertensive urgency: Secondary | ICD-10-CM | POA: Diagnosis not present

## 2020-03-07 DIAGNOSIS — Z87898 Personal history of other specified conditions: Secondary | ICD-10-CM | POA: Diagnosis not present

## 2020-03-07 DIAGNOSIS — R29810 Facial weakness: Secondary | ICD-10-CM | POA: Diagnosis present

## 2020-03-07 DIAGNOSIS — Z85820 Personal history of malignant melanoma of skin: Secondary | ICD-10-CM | POA: Diagnosis not present

## 2020-03-07 DIAGNOSIS — R531 Weakness: Secondary | ICD-10-CM | POA: Diagnosis not present

## 2020-03-07 DIAGNOSIS — I739 Peripheral vascular disease, unspecified: Secondary | ICD-10-CM | POA: Diagnosis present

## 2020-03-07 DIAGNOSIS — Z881 Allergy status to other antibiotic agents status: Secondary | ICD-10-CM

## 2020-03-07 DIAGNOSIS — Z85028 Personal history of other malignant neoplasm of stomach: Secondary | ICD-10-CM

## 2020-03-07 DIAGNOSIS — Z66 Do not resuscitate: Secondary | ICD-10-CM | POA: Diagnosis present

## 2020-03-07 DIAGNOSIS — G40409 Other generalized epilepsy and epileptic syndromes, not intractable, without status epilepticus: Secondary | ICD-10-CM | POA: Diagnosis present

## 2020-03-07 DIAGNOSIS — Z7982 Long term (current) use of aspirin: Secondary | ICD-10-CM

## 2020-03-07 DIAGNOSIS — R634 Abnormal weight loss: Secondary | ICD-10-CM | POA: Diagnosis present

## 2020-03-07 DIAGNOSIS — E785 Hyperlipidemia, unspecified: Secondary | ICD-10-CM | POA: Diagnosis present

## 2020-03-07 DIAGNOSIS — N1831 Chronic kidney disease, stage 3a: Secondary | ICD-10-CM | POA: Diagnosis present

## 2020-03-07 DIAGNOSIS — I129 Hypertensive chronic kidney disease with stage 1 through stage 4 chronic kidney disease, or unspecified chronic kidney disease: Secondary | ICD-10-CM | POA: Diagnosis present

## 2020-03-07 DIAGNOSIS — G936 Cerebral edema: Secondary | ICD-10-CM | POA: Diagnosis not present

## 2020-03-07 DIAGNOSIS — N200 Calculus of kidney: Secondary | ICD-10-CM | POA: Diagnosis not present

## 2020-03-07 DIAGNOSIS — I480 Paroxysmal atrial fibrillation: Secondary | ICD-10-CM | POA: Diagnosis present

## 2020-03-07 DIAGNOSIS — G4700 Insomnia, unspecified: Secondary | ICD-10-CM | POA: Diagnosis present

## 2020-03-07 DIAGNOSIS — C719 Malignant neoplasm of brain, unspecified: Secondary | ICD-10-CM | POA: Diagnosis not present

## 2020-03-07 DIAGNOSIS — R54 Age-related physical debility: Secondary | ICD-10-CM | POA: Diagnosis present

## 2020-03-07 DIAGNOSIS — I611 Nontraumatic intracerebral hemorrhage in hemisphere, cortical: Secondary | ICD-10-CM | POA: Diagnosis present

## 2020-03-07 DIAGNOSIS — Z9221 Personal history of antineoplastic chemotherapy: Secondary | ICD-10-CM | POA: Diagnosis not present

## 2020-03-07 DIAGNOSIS — F17210 Nicotine dependence, cigarettes, uncomplicated: Secondary | ICD-10-CM | POA: Diagnosis present

## 2020-03-07 DIAGNOSIS — C7931 Secondary malignant neoplasm of brain: Principal | ICD-10-CM | POA: Diagnosis present

## 2020-03-07 DIAGNOSIS — K219 Gastro-esophageal reflux disease without esophagitis: Secondary | ICD-10-CM | POA: Diagnosis present

## 2020-03-07 DIAGNOSIS — Z886 Allergy status to analgesic agent status: Secondary | ICD-10-CM

## 2020-03-07 DIAGNOSIS — J449 Chronic obstructive pulmonary disease, unspecified: Secondary | ICD-10-CM | POA: Diagnosis present

## 2020-03-07 DIAGNOSIS — Z79899 Other long term (current) drug therapy: Secondary | ICD-10-CM

## 2020-03-07 DIAGNOSIS — Z20822 Contact with and (suspected) exposure to covid-19: Secondary | ICD-10-CM | POA: Diagnosis present

## 2020-03-07 DIAGNOSIS — Z85038 Personal history of other malignant neoplasm of large intestine: Secondary | ICD-10-CM | POA: Diagnosis not present

## 2020-03-07 DIAGNOSIS — Z803 Family history of malignant neoplasm of breast: Secondary | ICD-10-CM

## 2020-03-07 DIAGNOSIS — Z90710 Acquired absence of both cervix and uterus: Secondary | ICD-10-CM

## 2020-03-07 DIAGNOSIS — R471 Dysarthria and anarthria: Secondary | ICD-10-CM | POA: Diagnosis present

## 2020-03-07 DIAGNOSIS — D496 Neoplasm of unspecified behavior of brain: Secondary | ICD-10-CM | POA: Diagnosis not present

## 2020-03-07 DIAGNOSIS — F419 Anxiety disorder, unspecified: Secondary | ICD-10-CM | POA: Diagnosis present

## 2020-03-07 DIAGNOSIS — Z888 Allergy status to other drugs, medicaments and biological substances status: Secondary | ICD-10-CM

## 2020-03-07 DIAGNOSIS — C711 Malignant neoplasm of frontal lobe: Secondary | ICD-10-CM | POA: Diagnosis not present

## 2020-03-07 LAB — I-STAT CHEM 8, ED
BUN: 24 mg/dL — ABNORMAL HIGH (ref 8–23)
Calcium, Ion: 1.23 mmol/L (ref 1.15–1.40)
Chloride: 105 mmol/L (ref 98–111)
Creatinine, Ser: 1.4 mg/dL — ABNORMAL HIGH (ref 0.44–1.00)
Glucose, Bld: 103 mg/dL — ABNORMAL HIGH (ref 70–99)
HCT: 43 % (ref 36.0–46.0)
Hemoglobin: 14.6 g/dL (ref 12.0–15.0)
Potassium: 4.1 mmol/L (ref 3.5–5.1)
Sodium: 143 mmol/L (ref 135–145)
TCO2: 29 mmol/L (ref 22–32)

## 2020-03-07 LAB — SARS CORONAVIRUS 2 BY RT PCR (HOSPITAL ORDER, PERFORMED IN ~~LOC~~ HOSPITAL LAB): SARS Coronavirus 2: NEGATIVE

## 2020-03-07 LAB — DIFFERENTIAL
Abs Immature Granulocytes: 0.02 10*3/uL (ref 0.00–0.07)
Basophils Absolute: 0.2 10*3/uL — ABNORMAL HIGH (ref 0.0–0.1)
Basophils Relative: 2 %
Eosinophils Absolute: 0.2 10*3/uL (ref 0.0–0.5)
Eosinophils Relative: 3 %
Immature Granulocytes: 0 %
Lymphocytes Relative: 17 %
Lymphs Abs: 1.4 10*3/uL (ref 0.7–4.0)
Monocytes Absolute: 0.7 10*3/uL (ref 0.1–1.0)
Monocytes Relative: 8 %
Neutro Abs: 5.8 10*3/uL (ref 1.7–7.7)
Neutrophils Relative %: 70 %

## 2020-03-07 LAB — COMPREHENSIVE METABOLIC PANEL
ALT: 16 U/L (ref 0–44)
AST: 20 U/L (ref 15–41)
Albumin: 4 g/dL (ref 3.5–5.0)
Alkaline Phosphatase: 89 U/L (ref 38–126)
Anion gap: 12 (ref 5–15)
BUN: 22 mg/dL (ref 8–23)
CO2: 24 mmol/L (ref 22–32)
Calcium: 9.6 mg/dL (ref 8.9–10.3)
Chloride: 100 mmol/L (ref 98–111)
Creatinine, Ser: 1.38 mg/dL — ABNORMAL HIGH (ref 0.44–1.00)
GFR calc Af Amer: 40 mL/min — ABNORMAL LOW (ref >60)
GFR calc non Af Amer: 35 mL/min — ABNORMAL LOW (ref >60)
Glucose, Bld: 104 mg/dL — ABNORMAL HIGH (ref 70–99)
Potassium: 4 mmol/L (ref 3.5–5.1)
Sodium: 136 mmol/L (ref 135–145)
Total Bilirubin: 0.4 mg/dL (ref 0.3–1.2)
Total Protein: 7.1 g/dL (ref 6.5–8.1)

## 2020-03-07 LAB — PROTIME-INR
INR: 1 (ref 0.8–1.2)
Prothrombin Time: 13.1 seconds (ref 11.4–15.2)

## 2020-03-07 LAB — CBC
HCT: 44.6 % (ref 36.0–46.0)
Hemoglobin: 13.7 g/dL (ref 12.0–15.0)
MCH: 28.5 pg (ref 26.0–34.0)
MCHC: 30.7 g/dL (ref 30.0–36.0)
MCV: 92.7 fL (ref 80.0–100.0)
Platelets: 255 10*3/uL (ref 150–400)
RBC: 4.81 MIL/uL (ref 3.87–5.11)
RDW: 13.5 % (ref 11.5–15.5)
WBC: 8.3 10*3/uL (ref 4.0–10.5)
nRBC: 0 % (ref 0.0–0.2)

## 2020-03-07 LAB — CBG MONITORING, ED: Glucose-Capillary: 92 mg/dL (ref 70–99)

## 2020-03-07 LAB — APTT: aPTT: 29 seconds (ref 24–36)

## 2020-03-07 MED ORDER — ACETAMINOPHEN 160 MG/5ML PO SOLN: 650.0000 mg | ORAL | Status: DC | PRN

## 2020-03-07 MED ORDER — CLEVIDIPINE BUTYRATE 0.5 MG/ML IV EMUL
0.0000 mg/h | INTRAVENOUS | Status: DC
  Administered 2020-03-07: 1 mg/h via INTRAVENOUS
  Filled 2020-03-07 (×2): qty 50

## 2020-03-07 MED ORDER — LABETALOL HCL 5 MG/ML IV SOLN
INTRAVENOUS | Status: AC
  Filled 2020-03-07: qty 4

## 2020-03-07 MED ORDER — STROKE: EARLY STAGES OF RECOVERY BOOK
Freq: Once | Status: AC
  Filled 2020-03-07: qty 1

## 2020-03-07 MED ORDER — LABETALOL HCL 5 MG/ML IV SOLN
10.0000 mg | Freq: Once | INTRAVENOUS | Status: AC
  Administered 2020-03-07: 10 mg via INTRAVENOUS
  Filled 2020-03-07: qty 4

## 2020-03-07 MED ORDER — SENNOSIDES-DOCUSATE SODIUM 8.6-50 MG PO TABS
1.0000 | ORAL_TABLET | Freq: Two times a day (BID) | ORAL | Status: DC
  Administered 2020-03-08 – 2020-03-11 (×4): 1 via ORAL
  Filled 2020-03-07 (×10): qty 1

## 2020-03-07 MED ORDER — LORAZEPAM 2 MG/ML IJ SOLN
0.5000 mg | Freq: Once | INTRAMUSCULAR | Status: AC | PRN
  Administered 2020-03-07: 0.5 mg via INTRAVENOUS
  Filled 2020-03-07: qty 1

## 2020-03-07 MED ORDER — ACETAMINOPHEN 325 MG PO TABS: 650.0000 mg | ORAL_TABLET | ORAL | Status: DC | PRN

## 2020-03-07 MED ORDER — PANTOPRAZOLE SODIUM 40 MG IV SOLR
40.0000 mg | Freq: Every day | INTRAVENOUS | Status: DC
  Administered 2020-03-07: 40 mg via INTRAVENOUS
  Filled 2020-03-07: qty 40

## 2020-03-07 MED ORDER — SODIUM CHLORIDE 0.9% FLUSH
3.0000 mL | Freq: Once | INTRAVENOUS | Status: AC
Start: 2020-03-07 — End: 2020-03-07
  Administered 2020-03-07: 3 mL via INTRAVENOUS

## 2020-03-07 MED ORDER — ACETAMINOPHEN 650 MG RE SUPP: 650.0000 mg | RECTAL | Status: DC | PRN

## 2020-03-07 NOTE — ED Provider Notes (Signed)
[facility] EMERGENCY DEPARTMENT Provider Note   CSN: [id number] Arrival date & time: 03/07/20  1840     History Chief Complaint  Patient presents with  . hand numbness    Jessica [name] is a 86 y.o. female.  The history is provided by the patient, medical records and a relative. No language interpreter was used.   Jessica [name] is a 86 y.o. female who presents to the Emergency Department complaining of abnormal MRI. She presents the emergency department complaining of abnormal MRI obtained earlier today. She has been experiencing a numbness to the left hand for the last couple weeks. She saw her PCP and an MRI of her brain was ordered. He was abnormal and she was told to present to the emergency department. She experiences weakness and difficulty holding things her left hand. She also has change in her speech and difficulty chewing because she has weakness to her left face with drooling. She also states it is difficult for her to walk at times. No reports of pain, vomiting. Symptoms are moderate and began abruptly. She feels like her facial symptoms have been worsening but none of her additional symptoms have worsened. [facility] She is accompanied by her daughter.    Past Medical History:  Diagnosis Date  . Alcohol abuse 11/11/2010  . Alcohol abuse, unspecified   . Anxiety   . Atrial fibrillation ([place]) 11/11/2010  . Bone infection of left hand ([place]) 12/22/2008   secondary to injury  . Cancer ([place])    colon cancer-hx of chemo  . Chronic airway obstruction, not elsewhere classified 10/01/2011  . Confusion   . COPD (chronic obstructive pulmonary disease) ([place])   . Dizziness and giddiness 04/24/2005  . Edema 12/03/2011  . Embolism - blood clot 1996   left breast  . Generalized convulsive epilepsy without mention of intractable epilepsy   . GERD (gastroesophageal reflux disease)   . Hemorrhoids   . Hyperlipidemia   . Hypertension     . Insomnia, unspecified 11/11/2010  . Major depressive disorder, single episode, unspecified 11/11/2010  . Malignant neoplasm of stomach, unspecified site 11/11/2010  . Melanoma ([place])    metastatic  . Peripheral vascular disease ([place])   . Seizures ([place])   . Symptomatic menopausal or female climacteric states 11/11/2010  . Tachycardia, unspecified   . Tobacco use disorder   . Urgency of urination     Patient Active Problem List   Diagnosis Date Noted  . ICH (intracerebral hemorrhage) ([place]) 03/07/2020  . Left sided numbness 02/28/2020  . Left-sided weakness 02/28/2020  . Stroke-like symptom 02/22/2020  . Hypomagnesemia 12/29/2018  . Hypokalemia 12/09/2018  . BPPV (benign paroxysmal positional vertigo) 12/09/2018  . Compression fracture of T9 vertebra ([place]) 09/02/2018  . CKD (chronic kidney disease) stage 3, GFR 30-59 ml/min 02/23/2018  . Need for pneumococcal vaccine 02/23/2018  . Prediabetes 02/23/2018  . Malnutrition ([place]) 01/04/2018  . Hyperglycemia 10/06/2017  . Episode of recurrent major depressive disorder ([place]) 10/06/2017  . Nonintractable epilepsy without status epilepticus ([place]) 07/14/2017  . Anemia 05/28/2017  . Loss of weight 01/18/2015  . Osteoarthritis of finger 02/16/2014  . Peripheral vascular disease ([place])   . Depression with anxiety   . Hyperlipidemia LDL goal <130   . Cancer ([place])   . Confusion   . Alcohol abuse   . Tachycardia   . Generalized convulsive epilepsy ([place])   . GERD (gastroesophageal reflux disease) 02/03/2013  . Personal history  of colon cancer 10/03/2011  . Bronchitis 09/26/2011  . COPD (chronic obstructive pulmonary disease) ([place]) 09/26/2011  . Hypertension 09/26/2011  . Hyperlipemia 09/26/2011  . Tobacco abuse 09/26/2011  . Diarrhea 09/26/2011    Past Surgical History:  Procedure Laterality Date  . ABDOMINAL HYSTERECTOMY  1963  . BLADDER REPAIR    . breast biopsies     x2 on left, x 1 on right  . c-sections  1957, 1959, 1961   x3   . COLON RESECTION  1995  . COLONOSCOPY  10/21/2012   Dr. Paulita Fujita diverticulosis, sessile polyps biopsy   . heart ablation  1997  . HEMORRHOID SURGERY    . laser surgery to right eye  10/04/2007  . left femoral artery stent  05/07/2004  . melanoma removed from left groin  09/2004  . melanoma removed from left leg  09/06/2004  . orif left humerus fracture  03/27/2005  . porta cath placement  1995  . porta cath removal  1996  . removal of tumor in left knee  11/15/2010  . skin graft to left leg  12/05/2004  . [place]     OB History   No obstetric history on file.     Family History  Problem Relation Age of Onset  . Cancer Mother   . Breast cancer Sister   . Cancer Brother     Social History   Tobacco Use  . Smoking status: Current Every Day Smoker    Years: 60.00    Types: Cigarettes  . Smokeless tobacco: Never Used  . Tobacco comment: 1 pack last 3 days-10/29/17  Substance Use Topics  . Alcohol use: No    Comment: quit 02-02-2010  . Drug use: No    Home Medications Prior to Admission medications   Medication Sig Start Date End Date Taking? Authorizing Provider  aspirin EC 81 MG tablet Take 81 mg by mouth daily. Enteric coated tablet after meal to prevent nausea, vomiting or GI upset.   Yes [provider]  Calcium Carb-Cholecalciferol (CALCIUM 600/VITAMIN D3) 600-800 MG-UNIT TABS Take 1 tablet by mouth daily.   Yes [provider]  esomeprazole (NEXIUM) 40 MG capsule Take 40 mg by mouth daily at 12 noon.   Yes [provider]  ipratropium (ATROVENT HFA) 17 MCG/ACT inhaler Inhale 2 puffs into the lungs every 6 (six) hours as needed for wheezing. 01/13/19  Yes Virgie Dad, MD  lamoTRIgine (LAMICTAL) 150 MG tablet Take 1 tablet (150 mg total) by mouth in the morning. 02/09/20  Yes Marcial Pacas, MD  lamoTRIgine 200 MG TBDP Take 1 tablet (200 mg total) by mouth at bedtime. 02/09/20  Yes Marcial Pacas, MD  LOSARTAN POTASSIUM PO Take 100  mg by mouth daily.   Yes [provider]  metoprolol succinate (TOPROL-XL) 50 MG 24 hr tablet Take 1 tablet (50 mg total) by mouth daily. Take with or immediately following a meal. 02/23/18  Yes Pandey, Mahima, MD  zonisamide (ZONEGRAN) 100 MG capsule Take 200 mg by mouth every evening.    Yes [provider]  zonisamide (ZONEGRAN) 100 MG capsule Take 100 mg by mouth in the morning.    Yes [provider]    Allergies    Aspirin, Effexor [venlafaxine hcl], Keflex [cephalexin], Levaquin [levofloxacin], Venlafaxine, Penicillins, and Zithromax [azithromycin]  Review of Systems   Review of Systems  All other systems reviewed and are negative.   Physical Exam Updated Vital Signs BP Marland Kitchen)  137/58   Pulse 73   Temp (!) 97.5 F (36.4 C) (Oral)   Resp 19   SpO2 90%   Physical Exam Vitals and nursing note reviewed.  Constitutional:      Appearance: She is well-developed.  HENT:     Head: Normocephalic and atraumatic.  Cardiovascular:     Rate and Rhythm: Normal rate and regular rhythm.     Heart sounds: No murmur.  Pulmonary:     Effort: Pulmonary effort is normal. No respiratory distress.     Breath sounds: Normal breath sounds.  Abdominal:     Palpations: Abdomen is soft.     Tenderness: There is no abdominal tenderness. There is no guarding or rebound.  Musculoskeletal:        General: No tenderness.  Skin:    General: Skin is warm and dry.  Neurological:     Mental Status: She is alert and oriented to person, place, and time.     Comments: Mild weakness in the left upper extremity. Left-sided facial droop. Visual fields are grossly intact. Five out of five strength and bilateral lower extremities.  Psychiatric:        Behavior: Behavior normal.     ED Results / Procedures / Treatments   Labs (all labs ordered are listed, but only abnormal results are displayed) Labs Reviewed  DIFFERENTIAL - Abnormal; Notable for the following components:       Result Value   Basophils Absolute 0.2 (*)    All other components within normal limits  COMPREHENSIVE METABOLIC PANEL - Abnormal; Notable for the following components:   Glucose, Bld 104 (*)    Creatinine, Ser 1.38 (*)    GFR calc non Af Amer 35 (*)    GFR calc Af Amer 40 (*)    All other components within normal limits  I-STAT CHEM 8, ED - Abnormal; Notable for the following components:   BUN 24 (*)    Creatinine, Ser 1.40 (*)    Glucose, Bld 103 (*)    All other components within normal limits  SARS CORONAVIRUS 2 BY RT PCR (HOSPITAL ORDER, [facility])  MRSA PCR SCREENING  PROTIME-INR  APTT  CBC  CBG MONITORING, ED    EKG None  Radiology MR BRAIN WO CONTRAST  Result Date: 03/07/2020 CLINICAL DATA:  86 year old female with left side deficits, suspected stroke. History of colon, ovarian, and melanoma malignancy reported. This is an outpatient who was scanned after hours, office of Dr. Marcial Pacas was contacted but is closed. EXAM: MRI HEAD WITHOUT CONTRAST TECHNIQUE: Multiplanar, multiecho pulse sequences of the brain and surrounding structures were obtained without intravenous contrast. COMPARISON:  Head CT 11/28/2018.  Brain MRI 05/20/2017. FINDINGS: Brain: Oval intra-axial masslike area of heterogeneous signal measuring about 46 x 35 x 33 mm (AP by transverse by CC) is centered at the right frontal operculum. There are internal areas of mixed hyperintense and hypointense T1/T2 and FLAIR signal. Some of the areas are restricted on diffusion (series 6 image 67) although there is no surrounding regional diffusion restriction. Much of the area is dark on susceptibility weighted imaging consistent with hemorrhage. There is surrounding T2 and FLAIR hyperintensity in a vasogenic edema pattern. There is no intraventricular extension of blood. No definite extra-axial extension of blood. Mass effect on the right lateral ventricle with no significant midline shift at this  time (series 11, image 16). Basilar cisterns remain normal. There is no additional area of similar signal abnormality  in the brain. And on SWI imaging no additional cerebral blood products are identified. No ventriculomegaly. Patchy, mild to moderate for age other nonspecific bilateral cerebral white matter T2 and FLAIR hyperintensity with no definite cortical encephalomalacia. T2 heterogeneity throughout the deep gray matter nuclei is chronic and more resembles perivascular spaces but a degree of chronic lacunar infarcts are also possible. Unchanged since 2018 small chronic infarct in the left cerebellum (series 10, image 8). Cervicomedullary junction and pituitary are within normal limits. Vascular: Major intracranial vascular flow voids are stable since 2018. Skull and upper cervical spine: Negative for age visible cervical spine. Visualized bone marrow signal is within normal limits. Sinuses/Orbits: Postoperative changes to both globes, otherwise negative orbits. Paranasal Visualized paranasal sinuses and mastoids are stable and well pneumatized. Other: Visible internal auditory structures appear normal. Scalp and face soft tissues appear negative. IMPRESSION: 1. Solitary intra-axial hemorrhage versus hemorrhagic mass at the right operculum measuring up to 4.6 cm (volume 27 mL). Regional vasogenic edema and mild mass effect with no midline shift at this time. No extra-axial or intraventricular extension of blood. There is no larger surrounding area of ischemia to strongly suggest a hemorrhagic infarct, and this is indeterminate for a small vessel versus tumor related bleed. Post-contrast MRI images would be valuable, although a repeat MRI without and with contrast once the hematoma has resolved would be most specific. 2. Evidence of chronic small vessel disease otherwise stable since the 2018 MRI. I recommended this patient be seen in an Emergency Department, and her family was agreeable to transport her by  private vehicle from [place] to the nearby [facility] ED. In anticipation of the patient's arrival, salient findings were discussed by telephone with the [facility] ED Charge Nurse on 03/07/2020 at 18:33 . Electronically Signed   By: Genevie Ann M.D.   On: 03/07/2020 18:42   VAS US CAROTID  Result Date: 03/06/2020 Carotid Arterial Duplex Study Indications:       Numbness. Risk Factors:      Hypertension, hyperlipidemia, PAD. Comparison Study:  No prior study Performing Technologist: Maudry Mayhew MHA, RDMS, RVT, RDCS  Examination Guidelines: A complete evaluation includes B-mode imaging, spectral Doppler, color Doppler, and power Doppler as needed of all accessible portions of each vessel. Bilateral testing is considered an integral part of a complete examination. Limited examinations for reoccurring indications may be performed as noted.  Right Carotid Findings: +----------+--------+-------+--------+--------------------------------+--------+           PSV cm/sEDV    StenosisPlaque Description              Comments                   cm/s                                                    +----------+--------+-------+--------+--------------------------------+--------+ CCA Prox  78      9                                                       +----------+--------+-------+--------+--------------------------------+--------+ CCA Distal61      12  smooth, heterogenous and                                                  calcific                                 +----------+--------+-------+--------+--------------------------------+--------+ ICA Prox  59      11             smooth, heterogenous and                                                  calcific                                 +----------+--------+-------+--------+--------------------------------+--------+ ICA Distal76      17                                                       +----------+--------+-------+--------+--------------------------------+--------+ ECA       166     14             heterogenous                             +----------+--------+-------+--------+--------------------------------+--------+ +----------+--------+-------+----------------+-------------------+           PSV cm/sEDV cmsDescribe        Arm Pressure (mmHG) +----------+--------+-------+----------------+-------------------+ [id number]             Multiphasic, WNL                    +----------+--------+-------+----------------+-------------------+ +---------+--------+--+--------+--+---------+ VertebralPSV cm/s77EDV cm/s16Antegrade +---------+--------+--+--------+--+---------+  Left Carotid Findings: +----------+-------+-------+--------+---------------------------------+--------+           PSV    EDV    StenosisPlaque Description               Comments           cm/s   cm/s                                                     +----------+-------+-------+--------+---------------------------------+--------+ CCA Prox  80     12                                                       +----------+-------+-------+--------+---------------------------------+--------+ CCA Distal51     13             smooth and heterogenous                   +----------+-------+-------+--------+---------------------------------+--------+ ICA Prox  55     12  heterogenous, irregular and                                               calcific                                  +----------+-------+-------+--------+---------------------------------+--------+ ICA Distal117    32                                                       +----------+-------+-------+--------+---------------------------------+--------+ ECA       63     3                                                         +----------+-------+-------+--------+---------------------------------+--------+ +----------+--------+--------+----------------+-------------------+           PSV cm/sEDV cm/sDescribe        Arm Pressure (mmHG) +----------+--------+--------+----------------+-------------------+ [id number]              Multiphasic, WNL                    +----------+--------+--------+----------------+-------------------+ +---------+--------+--+--------+-+---------+ VertebralPSV cm/s40EDV cm/s7Antegrade +---------+--------+--+--------+-+---------+   Summary: Right Carotid: Velocities in the right ICA are consistent with a 1-39% stenosis. Left Carotid: Velocities in the left ICA are consistent with a 1-39% stenosis. Vertebrals:  Bilateral vertebral arteries demonstrate antegrade flow. Subclavians: Normal flow hemodynamics were seen in bilateral subclavian              arteries. *See table(s) above for measurements and observations.  Electronically signed by Deitra Mayo MD on 03/06/2020 at 4:05:55 PM.    Final     Procedures Procedures (including critical care time)  Medications Ordered in ED Medications   stroke: mapping our early stages of recovery book (has no administration in time range)  acetaminophen (TYLENOL) tablet 650 mg (has no administration in time range)    Or  acetaminophen (TYLENOL) 160 MG/5ML solution 650 mg (has no administration in time range)    Or  acetaminophen (TYLENOL) suppository 650 mg (has no administration in time range)  senna-docusate (Senokot-S) tablet 1 tablet (1 tablet Oral Refused 03/07/20 2220)  pantoprazole (PROTONIX) injection 40 mg (40 mg Intravenous Given 03/07/20 2220)  clevidipine (CLEVIPREX) infusion 0.5 mg/mL (1 mg/hr Intravenous New Bag/Given 03/07/20 2235)  sodium chloride flush (NS) 0.9 % injection 3 mL (3 mLs Intravenous Given 03/07/20 2047)  LORazepam (ATIVAN) injection 0.5 mg (0.5 mg Intravenous Given 03/07/20 2051)  labetalol (NORMODYNE) injection 10  mg (10 mg Intravenous Given 03/07/20 2047)  labetalol (NORMODYNE) 5 MG/ML injection (  Given 03/07/20 2111)    ED Course  I have reviewed the triage vital signs and the nursing notes.  Pertinent labs & imaging results that were available during my care of the patient were reviewed by me and considered in my medical decision making (see chart for details).    MDM Rules/Calculators/A&P  Patient referred to the emergency department for abnormal MRI. She does have left-sided deficits on examination that is been present for the last two weeks. Discussed with Dr. Lorraine Lax, with neurology he will see the patient in consult. She was treated with labetalol for hypertension. Plan to obtain MRI with contrast to further evaluate her acute disease process.  Final Clinical Impression(s) / ED Diagnoses Final diagnoses:  None    Rx / DC Orders ED Discharge Orders    None       Quintella Reichert, MD 03/08/20 [phone]

## 2020-03-07 NOTE — ED Triage Notes (Addendum)
Pt presents to ED POV. Pt c/o finger numbness 2w ago. Went to [facility] today and was sent here. Pt  Neuro intact currently. C/o of some hand numbness still.

## 2020-03-07 NOTE — ED Notes (Signed)
Dr. Johnney Killian consulted about pt. CT not indicated at this time. MD wants to see pt first.

## 2020-03-07 NOTE — H&P (Signed)
Chief Complaint: Left-sided weakness/numbness  History obtained from: Patient and Chart    HPI:                                                                                                                                       Jessica [name] is a 86 y.o. female with past medical history of colon cancer, metastatic melanoma fibrillation not on anticoagulation, hypertension, alcohol abuse, COPD, seizures with abrupt onset left-sided weakness and numbness around 2 weeks presents to emergency department after MRI brain outpatient revealed a moderate sized right frontoparietal hemorrhage.  Patient states that approximately 2 weeks ago she had abrupt onset numbness of the left side as well as some weakness.  There is possibly facial droop and her symptoms persisted.  She was seen in the neurology clinic stroke work-up initiated including MRI brain.  Patient was started on aspirin 81 mg.  Patient denies symptoms becoming worse over the last few days.  MRI brain was performed today-showed a four-point centimeter hemorrhagic mass in the right frontal lobe with vasogenic edema and mild mass-effect-concerning for primary hemorrhage versus hemorrhagic metastasis.  On arrival to [facility], blood pressures greater than 947 systolic.  Labetalol was given twice with some improvement, however with any increase and therefore patient was started Cleviprex.   Date last known well: Approximately 2 weeks ago tPA Given: No, Hemorrhage NIHSS: 1 Baseline MRS 0  Intracerebral Hemorrhage (ICH) Score  Glascow Coma Score 13-15 0  Age >/= 80 yes +1  ICH volume >/= 80ml  yes +1  IVH yes no 0  Infratentorial origin *no 0 Total:  2   Past Medical History:  Diagnosis Date  . Alcohol abuse 11/11/2010  . Alcohol abuse, unspecified   . Anxiety   . Atrial fibrillation ([place]) 11/11/2010  . Bone infection of left hand ([place]) 12/22/2008   secondary to injury  . Cancer ([place])    colon cancer-hx of chemo  .  Chronic airway obstruction, not elsewhere classified 10/01/2011  . Confusion   . COPD (chronic obstructive pulmonary disease) ([place])   . Dizziness and giddiness 04/24/2005  . Edema 12/03/2011  . Embolism - blood clot 1996   left breast  . Generalized convulsive epilepsy without mention of intractable epilepsy   . GERD (gastroesophageal reflux disease)   . Hemorrhoids   . Hyperlipidemia   . Hypertension   . Insomnia, unspecified 11/11/2010  . Major depressive disorder, single episode, unspecified 11/11/2010  . Malignant neoplasm of stomach, unspecified site 11/11/2010  . Melanoma ([place])    metastatic  . Peripheral vascular disease ([place])   . Seizures ([place])   . Symptomatic menopausal or female climacteric states 11/11/2010  . Tachycardia, unspecified   . Tobacco use disorder   . Urgency of urination     Past Surgical History:  Procedure Laterality Date  . ABDOMINAL HYSTERECTOMY  1963  . BLADDER REPAIR    .  breast biopsies     x2 on left, x 1 on right  . c-sections  1957, 1959, 1961   x3  . COLON RESECTION  1995  . COLONOSCOPY  10/21/2012   Dr. Paulita Fujita diverticulosis, sessile polyps biopsy   . heart ablation  1997  . HEMORRHOID SURGERY    . laser surgery to right eye  10/04/2007  . left femoral artery stent  05/07/2004  . melanoma removed from left groin  09/2004  . melanoma removed from left leg  09/06/2004  . orif left humerus fracture  03/27/2005  . porta cath placement  1995  . porta cath removal  1996  . removal of tumor in left knee  11/15/2010  . skin graft to left leg  12/05/2004  . VOCAL CORD INJECTION  1996, 1997    Family History  Problem Relation Age of Onset  . Cancer Mother   . Breast cancer Sister   . Cancer Brother    Social History:  reports that she has been smoking cigarettes. She has smoked for the past 60.00 years. She has never used smokeless tobacco. She reports that she does not drink alcohol or use drugs.  Allergies:  Allergies  Allergen Reactions  . Aspirin  Nausea And Vomiting  . Effexor [Venlafaxine Hcl]     Seizure  . Keflex [Cephalexin]   . Levaquin [Levofloxacin]   . Venlafaxine Other (See Comments)    seizures  . Penicillins Rash    Has patient had a PCN reaction causing immediate rash, facial/tongue/throat swelling, SOB or lightheadedness with hypotension: Yes Has patient had a PCN reaction causing severe rash involving mucus membranes or skin necrosis: No Has patient had a PCN reaction that required hospitalization: No Has patient had a PCN reaction occurring within the last 10 years: No If all of the above answers are "NO", then may proceed with Cephalosporin use.   Marland Kitchen Zithromax [Azithromycin] Rash    Medications:                                                                                                                        I reviewed home medications   ROS:                                                                                                                                     14 systems reviewed and negative except above  Examination:                                                                                                      General: Appears well-developed  Psych: Affect appropriate to situation Eyes: No scleral injection HENT: No OP obstrucion Head: Normocephalic.  Cardiovascular: Normal rate and regular rhythm.  Respiratory: Effort normal and breath sounds normal to anterior ascultation GI: Soft.  No distension. There is no tenderness.  Skin: WDI    Neurological Examination Mental Status: Alert, oriented, thought content appropriate.  Speech fluent without evidence of aphasia. Able to follow 3 step commands without difficulty.  Mild dysarthria present Cranial Nerves: II: Visual fields grossly normal,  III,IV, VI: ptosis not present, extra-ocular motions intact bilaterally, pupils equal, round, reactive to light and accommodation V,VII: smile symmetric, facial light touch sensation normal  bilaterally VIII: hearing normal bilaterally IX,X: uvula rises symmetrically XI: bilateral shoulder shrug XII: midline tongue extension Motor: Right : Upper extremity   5/5    Left:     Upper extremity   5/5  Lower extremity   5/5     Lower extremity   5/5 Tone and bulk:normal tone throughout; no atrophy noted Sensory: Pinprick and light touch intact throughout, bilaterally, left-sided paresthesia Plantars: Right: downgoing   Left: downgoing Cerebellar: normal finger-to-nose, normal rapid alternating movements and normal heel-to-shin test      Lab Results: Basic Metabolic Panel: Recent Labs  Lab 03/07/20 1921 03/07/20 1930  NA 136 143  K 4.0 4.1  CL 100 105  CO2 24  --   GLUCOSE 104* 103*  BUN 22 24*  CREATININE 1.38* 1.40*  CALCIUM 9.6  --     CBC: Recent Labs  Lab 03/07/20 1921 03/07/20 1930  WBC 8.3  --   NEUTROABS 5.8  --   HGB 13.7 14.6  HCT 44.6 43.0  MCV 92.7  --   PLT 255  --     Coagulation Studies: Recent Labs    03/07/20 1921  LABPROT 13.1  INR 1.0    Imaging: MR BRAIN WO CONTRAST  Result Date: 03/07/2020 CLINICAL DATA:  86 year old female with left side deficits, suspected stroke. History of colon, ovarian, and melanoma malignancy reported. This is an outpatient who was scanned after hours, office of Dr. Marcial Pacas was contacted but is closed. EXAM: MRI HEAD WITHOUT CONTRAST TECHNIQUE: Multiplanar, multiecho pulse sequences of the brain and surrounding structures were obtained without intravenous contrast. COMPARISON:  Head CT 11/28/2018.  Brain MRI 05/20/2017. FINDINGS: Brain: Oval intra-axial masslike area of heterogeneous signal measuring about 46 x 35 x 33 mm (AP by transverse by CC) is centered at the right frontal operculum. There are internal areas of mixed hyperintense and hypointense T1/T2 and FLAIR signal. Some of the areas are restricted on diffusion (series 6 image 67) although there is no surrounding regional diffusion restriction. Much  of the area is dark on susceptibility weighted imaging consistent with hemorrhage. There is surrounding T2 and FLAIR hyperintensity in a vasogenic edema pattern. There is no intraventricular extension of blood. No definite extra-axial extension  of blood. Mass effect on the right lateral ventricle with no significant midline shift at this time (series 11, image 16). Basilar cisterns remain normal. There is no additional area of similar signal abnormality in the brain. And on SWI imaging no additional cerebral blood products are identified. No ventriculomegaly. Patchy, mild to moderate for age other nonspecific bilateral cerebral white matter T2 and FLAIR hyperintensity with no definite cortical encephalomalacia. T2 heterogeneity throughout the deep gray matter nuclei is chronic and more resembles perivascular spaces but a degree of chronic lacunar infarcts are also possible. Unchanged since 2018 small chronic infarct in the left cerebellum (series 10, image 8). Cervicomedullary junction and pituitary are within normal limits. Vascular: Major intracranial vascular flow voids are stable since 2018. Skull and upper cervical spine: Negative for age visible cervical spine. Visualized bone marrow signal is within normal limits. Sinuses/Orbits: Postoperative changes to both globes, otherwise negative orbits. Paranasal Visualized paranasal sinuses and mastoids are stable and well pneumatized. Other: Visible internal auditory structures appear normal. Scalp and face soft tissues appear negative. IMPRESSION: 1. Solitary intra-axial hemorrhage versus hemorrhagic mass at the right operculum measuring up to 4.6 cm (volume 27 mL). Regional vasogenic edema and mild mass effect with no midline shift at this time. No extra-axial or intraventricular extension of blood. There is no larger surrounding area of ischemia to strongly suggest a hemorrhagic infarct, and this is indeterminate for a small vessel versus tumor related bleed.  Post-contrast MRI images would be valuable, although a repeat MRI without and with contrast once the hematoma has resolved would be most specific. 2. Evidence of chronic small vessel disease otherwise stable since the 2018 MRI. I recommended this patient be seen in an Emergency Department, and her family was agreeable to transport her by private vehicle from [place] to the nearby [facility] ED. In anticipation of the patient's arrival, salient findings were discussed by telephone with the [facility] ED Charge Nurse on 03/07/2020 at 18:33 . Electronically Signed   By: Genevie Ann M.D.   On: 03/07/2020 18:42   VAS US CAROTID  Result Date: 03/06/2020 Carotid Arterial Duplex Study Indications:       Numbness. Risk Factors:      Hypertension, hyperlipidemia, PAD. Comparison Study:  No prior study Performing Technologist: Maudry Mayhew MHA, RDMS, RVT, RDCS  Examination Guidelines: A complete evaluation includes B-mode imaging, spectral Doppler, color Doppler, and power Doppler as needed of all accessible portions of each vessel. Bilateral testing is considered an integral part of a complete examination. Limited examinations for reoccurring indications may be performed as noted.  Right Carotid Findings: +----------+--------+-------+--------+--------------------------------+--------+           PSV cm/sEDV    StenosisPlaque Description              Comments                   cm/s                                                    +----------+--------+-------+--------+--------------------------------+--------+ CCA Prox  78      9                                                       +----------+--------+-------+--------+--------------------------------+--------+  CCA Distal61      12             smooth, heterogenous and                                                  calcific                                 +----------+--------+-------+--------+--------------------------------+--------+ ICA  Prox  59      11             smooth, heterogenous and                                                  calcific                                 +----------+--------+-------+--------+--------------------------------+--------+ ICA Distal76      17                                                      +----------+--------+-------+--------+--------------------------------+--------+ ECA       166     14             heterogenous                             +----------+--------+-------+--------+--------------------------------+--------+ +----------+--------+-------+----------------+-------------------+           PSV cm/sEDV cmsDescribe        Arm Pressure (mmHG) +----------+--------+-------+----------------+-------------------+ [id number]             Multiphasic, WNL                    +----------+--------+-------+----------------+-------------------+ +---------+--------+--+--------+--+---------+ VertebralPSV cm/s77EDV cm/s16Antegrade +---------+--------+--+--------+--+---------+  Left Carotid Findings: +----------+-------+-------+--------+---------------------------------+--------+           PSV    EDV    StenosisPlaque Description               Comments           cm/s   cm/s                                                     +----------+-------+-------+--------+---------------------------------+--------+ CCA Prox  80     12                                                       +----------+-------+-------+--------+---------------------------------+--------+ CCA Distal51     13             smooth and heterogenous                   +----------+-------+-------+--------+---------------------------------+--------+  ICA Prox  55     12             heterogenous, irregular and                                               calcific                                  +----------+-------+-------+--------+---------------------------------+--------+  ICA Distal117    32                                                       +----------+-------+-------+--------+---------------------------------+--------+ ECA       63     3                                                        +----------+-------+-------+--------+---------------------------------+--------+ +----------+--------+--------+----------------+-------------------+           PSV cm/sEDV cm/sDescribe        Arm Pressure (mmHG) +----------+--------+--------+----------------+-------------------+ [id number]              Multiphasic, WNL                    +----------+--------+--------+----------------+-------------------+ +---------+--------+--+--------+-+---------+ VertebralPSV cm/s40EDV cm/s7Antegrade +---------+--------+--+--------+-+---------+   Summary: Right Carotid: Velocities in the right ICA are consistent with a 1-39% stenosis. Left Carotid: Velocities in the left ICA are consistent with a 1-39% stenosis. Vertebrals:  Bilateral vertebral arteries demonstrate antegrade flow. Subclavians: Normal flow hemodynamics were seen in bilateral subclavian              arteries. *See table(s) above for measurements and observations.  Electronically signed by Deitra Mayo MD on 03/06/2020 at 4:05:55 PM.    Final      ASSESSMENT AND PLAN   Acute/subacute right frontal hemorrhage vs hemorrhagic mass  Plan: Start Cleviprex goal BP less than 768 systolic No antiplatelets/anticoagulants Repeat MRI brain with contrast to look for underlying mass Consider vascular imaging depending on MRI brain with contrast findings Frequent neurochecks Swallow evaluation PT OT evaluation  Hypertensive emergency Blood pressure goal less than 115 systolic Cleviprex gtt. she  Atrial fibrillation -Not on anticoagulants at home,  -We will monitor heart rate greater than 130 may consider as needed metoprolol  COPD -As needed nebs  Full code DVT prophylaxis: SCD   This  patient is neurologically critically ill due to right frontal hemorrhage.  She is at risk for significant risk of neurological worsening from cerebral edema,  death from brain herniation, heart failure, infection, respiratory failure and seizure. This patient's care requires constant monitoring of vital signs, hemodynamics, respiratory and cardiac monitoring, review of multiple databases, neurological assessment, discussion with family, other specialists and medical decision making of high complexity.  I spent 55 minutes of neurocritical time in the care of this patient.      Arvie Villarruel [facility] Pager Number [phone]

## 2020-03-08 ENCOUNTER — Inpatient Hospital Stay (HOSPITAL_COMMUNITY): Payer: Medicare Other

## 2020-03-08 ENCOUNTER — Ambulatory Visit (HOSPITAL_COMMUNITY): Admission: RE | Admit: 2020-03-08 | Payer: Medicare Other | Source: Ambulatory Visit

## 2020-03-08 ENCOUNTER — Encounter (HOSPITAL_COMMUNITY): Payer: Self-pay

## 2020-03-08 ENCOUNTER — Other Ambulatory Visit: Payer: Self-pay | Admitting: Radiation Therapy

## 2020-03-08 ENCOUNTER — Telehealth: Payer: Self-pay | Admitting: Neurology

## 2020-03-08 LAB — MRSA PCR SCREENING: MRSA by PCR: NEGATIVE

## 2020-03-08 MED ORDER — ZONISAMIDE 100 MG PO CAPS
200.0000 mg | ORAL_CAPSULE | Freq: Every evening | ORAL | Status: DC
  Administered 2020-03-08 – 2020-03-12 (×5): 200 mg via ORAL
  Filled 2020-03-08 (×6): qty 2

## 2020-03-08 MED ORDER — CALCIUM CARBONATE-VITAMIN D 500-200 MG-UNIT PO TABS
1.0000 | ORAL_TABLET | Freq: Every day | ORAL | Status: DC
  Administered 2020-03-08 – 2020-03-13 (×6): 1 via ORAL
  Filled 2020-03-08 (×6): qty 1

## 2020-03-08 MED ORDER — METOPROLOL SUCCINATE ER 50 MG PO TB24
50.0000 mg | ORAL_TABLET | Freq: Every day | ORAL | Status: DC
  Administered 2020-03-08 – 2020-03-13 (×6): 50 mg via ORAL
  Filled 2020-03-08 (×6): qty 1

## 2020-03-08 MED ORDER — GADOBUTROL 1 MMOL/ML IV SOLN
4.5000 mL | Freq: Once | INTRAVENOUS | Status: AC | PRN
  Administered 2020-03-08: 4.5 mL via INTRAVENOUS

## 2020-03-08 MED ORDER — HYDRALAZINE HCL 20 MG/ML IJ SOLN: 20.0000 mg | Freq: Four times a day (QID) | INTRAMUSCULAR | Status: DC | PRN

## 2020-03-08 MED ORDER — ENSURE ENLIVE PO LIQD
237.0000 mL | Freq: Three times a day (TID) | ORAL | Status: DC
  Administered 2020-03-09 – 2020-03-13 (×2): 237 mL via ORAL

## 2020-03-08 MED ORDER — LAMOTRIGINE 150 MG PO TABS
150.0000 mg | ORAL_TABLET | Freq: Every day | ORAL | Status: DC
  Administered 2020-03-09 – 2020-03-13 (×5): 150 mg via ORAL
  Filled 2020-03-08 (×7): qty 1

## 2020-03-08 MED ORDER — ZONISAMIDE 100 MG PO CAPS
100.0000 mg | ORAL_CAPSULE | Freq: Every morning | ORAL | Status: DC
  Administered 2020-03-08 – 2020-03-13 (×6): 100 mg via ORAL
  Filled 2020-03-08 (×6): qty 1

## 2020-03-08 MED ORDER — PANTOPRAZOLE SODIUM 40 MG PO TBEC: 40.0000 mg | DELAYED_RELEASE_TABLET | Freq: Every day | ORAL | Status: DC

## 2020-03-08 MED ORDER — LAMOTRIGINE 200 MG PO TABS
200.0000 mg | ORAL_TABLET | Freq: Every day | ORAL | Status: DC
  Filled 2020-03-08: qty 1

## 2020-03-08 MED ORDER — DEXAMETHASONE SODIUM PHOSPHATE 4 MG/ML IJ SOLN
4.0000 mg | Freq: Four times a day (QID) | INTRAMUSCULAR | Status: DC
  Administered 2020-03-08 – 2020-03-13 (×19): 4 mg via INTRAVENOUS
  Filled 2020-03-08 (×19): qty 1

## 2020-03-08 MED ORDER — IOHEXOL 9 MG/ML PO SOLN
500.0000 mL | ORAL | Status: AC
  Administered 2020-03-08: 500 mL via ORAL
  Administered 2020-03-08: 350 mL via ORAL

## 2020-03-08 MED ORDER — LOSARTAN POTASSIUM 50 MG PO TABS
100.0000 mg | ORAL_TABLET | Freq: Every day | ORAL | Status: DC
  Administered 2020-03-08 – 2020-03-13 (×6): 100 mg via ORAL
  Filled 2020-03-08 (×6): qty 2

## 2020-03-08 MED ORDER — LAMOTRIGINE 200 MG PO TABS
200.0000 mg | ORAL_TABLET | Freq: Every day | ORAL | Status: DC
  Administered 2020-03-08 – 2020-03-12 (×5): 200 mg via ORAL
  Filled 2020-03-08 (×5): qty 1

## 2020-03-08 MED ORDER — PANTOPRAZOLE SODIUM 40 MG IV SOLR
40.0000 mg | Freq: Two times a day (BID) | INTRAVENOUS | Status: DC
  Administered 2020-03-08 – 2020-03-09 (×2): 40 mg via INTRAVENOUS
  Filled 2020-03-08 (×2): qty 40

## 2020-03-08 MED ORDER — IPRATROPIUM BROMIDE 0.02 % IN SOLN: 3.0000 mL | Freq: Four times a day (QID) | RESPIRATORY_TRACT | Status: DC | PRN

## 2020-03-08 MED ORDER — LABETALOL HCL 5 MG/ML IV SOLN
20.0000 mg | INTRAVENOUS | Status: DC | PRN
  Administered 2020-03-08 – 2020-03-11 (×5): 20 mg via INTRAVENOUS
  Filled 2020-03-08 (×5): qty 4

## 2020-03-08 MED ORDER — CHLORHEXIDINE GLUCONATE CLOTH 2 % EX PADS
6.0000 | MEDICATED_PAD | Freq: Every day | CUTANEOUS | Status: DC
  Administered 2020-03-09 – 2020-03-13 (×2): 6 via TOPICAL

## 2020-03-08 MED ORDER — IOHEXOL 300 MG/ML  SOLN
80.0000 mL | Freq: Once | INTRAMUSCULAR | Status: AC | PRN
  Administered 2020-03-08: 80 mL via INTRAVENOUS

## 2020-03-08 NOTE — Progress Notes (Signed)
Reason for Consult:Right frontal hemorrhage Referring Physician: Dr. Leonie Man   HPI: Jessica [name] is an 86 year old female with a past medical history of colon cancer, metastatic melanoma, atrial fibrillation not on anticoagulation, HTN, alcohol abuse, COPD, seizures who began having numbness in her left fingers about two weeks ago. She reported the symptoms persisted and slowly progressed to include her entire left hand, and left side of her lower face. She stated "my tongue was not operating the way it should be and something was going on in my cheek." She further reported difficulty talking and eating as well as drooling. The persistent and worsening symptoms prompted her to present to the neurology clinic where a stroke workup was ensued. The MRI from the stroke workup was remarkable for a moderate sized right frontoparietal hemorrhage. She was then directed to the ED for evaluation. On arrival to the ED, her SBP was noted to be greater than 200 and was given two doses of labetalol and subsequently started on a Cleviprex gtt.    Past Medical History:  Diagnosis Date   Alcohol abuse 11/11/2010   Alcohol abuse, unspecified    Anxiety    Atrial fibrillation ([place]) 11/11/2010   Bone infection of left hand ([place]) 12/22/2008   secondary to injury   Cancer [facility])    colon cancer-hx of chemo   Chronic airway obstruction, not elsewhere classified 10/01/2011   Confusion    COPD (chronic obstructive pulmonary disease) (HCC)    Dizziness and giddiness 04/24/2005   Edema 12/03/2011   Embolism - blood clot 1996   left breast   Generalized convulsive epilepsy without mention of intractable epilepsy    GERD (gastroesophageal reflux disease)    Hemorrhoids    Hyperlipidemia    Hypertension    Insomnia, unspecified 11/11/2010   Major depressive disorder, single episode, unspecified 11/11/2010   Malignant neoplasm of stomach, unspecified site 11/11/2010   Melanoma ([place])    metastatic   Peripheral vascular  disease ([place])    Seizures ([place])    Symptomatic menopausal or female climacteric states 11/11/2010   Tachycardia, unspecified    Tobacco use disorder    Urgency of urination     Past Surgical History:  Procedure Laterality Date   ABDOMINAL HYSTERECTOMY  1963   BLADDER REPAIR     breast biopsies     x2 on left, x 1 on right   c-sections  1957, 1959, 1961   x3   [place]  10/21/2012   Dr. Paulita Fujita diverticulosis, sessile polyps biopsy    heart ablation  1997   HEMORRHOID SURGERY     laser surgery to right eye  10/04/2007   left femoral artery stent  05/07/2004   melanoma removed from left groin  09/2004   melanoma removed from left leg  09/06/2004   orif left humerus fracture  03/27/2005   porta cath placement  1995   porta cath removal  1996   removal of tumor in left knee  11/15/2010   skin graft to left leg  12/05/2004   VOCAL CORD INJECTION  1996, 1997    Family History  Problem Relation Age of Onset   Cancer Mother    Breast cancer Sister    Cancer Brother     Social History:  reports that she has been smoking cigarettes. She has smoked for the past 60.00 years. She has never used smokeless tobacco. She reports that she does not drink alcohol and does not use  drugs.  Allergies:  Allergies  Allergen Reactions   Aspirin Nausea And Vomiting   Effexor [Venlafaxine Hcl]     Seizure   Keflex [Cephalexin]    Levaquin [Levofloxacin]    Venlafaxine Other (See Comments)    seizures   Penicillins Rash    Has patient had a PCN reaction causing immediate rash, facial/tongue/throat swelling, SOB or lightheadedness with hypotension: Yes Has patient had a PCN reaction causing severe rash involving mucus membranes or skin necrosis: No Has patient had a PCN reaction that required hospitalization: No Has patient had a PCN reaction occurring within the last 10 years: No If all of the above answers are "NO", then may proceed with Cephalosporin use.    Zithromax  [Azithromycin] Rash    Medications: I have reviewed the patient's current medications.  Results for orders placed or performed during the hospital encounter of 03/07/20 (from the past 48 hour(s))  Protime-INR     Status: None   Collection Time: 03/07/20  7:21 PM  Result Value Ref Range   Prothrombin Time 13.1 11.4 - 15.2 seconds   INR 1.0 0.8 - 1.2    Comment: (NOTE) INR goal varies based on device and disease states. Performed at [facility], [place] [address] [place], [place] [address]   APTT     Status: None   Collection Time: 03/07/20  7:21 PM  Result Value Ref Range   aPTT 29 24 - 36 seconds    Comment: Performed at [place] [address], [place], [place] [address]  CBC     Status: None   Collection Time: 03/07/20  7:21 PM  Result Value Ref Range   WBC 8.3 4.0 - 10.5 K/uL   RBC 4.81 3.87 - 5.11 MIL/uL   Hemoglobin 13.7 12.0 - 15.0 g/dL   HCT 44.6 36 - 46 %   MCV 92.7 80.0 - 100.0 fL   MCH 28.5 26.0 - 34.0 pg   MCHC 30.7 30.0 - 36.0 g/dL   RDW 13.5 11.5 - 15.5 %   Platelets 255 150 - 400 K/uL   nRBC 0.0 0.0 - 0.2 %    Comment: Performed at [facility], [place] [address] [place], [place] [address]  Differential     Status: Abnormal   Collection Time: 03/07/20  7:21 PM  Result Value Ref Range   Neutrophils Relative % 70 %   Neutro Abs 5.8 1.7 - 7.7 K/uL   Lymphocytes Relative 17 %   Lymphs Abs 1.4 0.7 - 4.0 K/uL   Monocytes Relative 8 %   Monocytes Absolute 0.7 0 - 1 K/uL   Eosinophils Relative 3 %   Eosinophils Absolute 0.2 0 - 0 K/uL   Basophils Relative 2 %   Basophils Absolute 0.2 (H) 0 - 0 K/uL   Immature Granulocytes 0 %   Abs Immature Granulocytes 0.02 0.00 - 0.07 K/uL    Comment: Performed at [place] [address], [place], [place] [address]  Comprehensive metabolic panel     Status: Abnormal   Collection Time: 03/07/20  7:21 PM  Result Value Ref Range   Sodium 136 135 - 145 mmol/L   Potassium 4.0 3.5 - 5.1 mmol/L    Chloride 100 98 - 111 mmol/L   CO2 24 22 - 32 mmol/L   Glucose, Bld 104 (H) 70 - 99 mg/dL    Comment: Glucose reference range applies only to samples taken after fasting for at least  8 hours.   BUN 22 8 - 23 mg/dL   Creatinine, Ser 1.38 (H) 0.44 - 1.00 mg/dL   Calcium 9.6 8.9 - 10.3 mg/dL   Total Protein 7.1 6.5 - 8.1 g/dL   Albumin 4.0 3.5 - 5.0 g/dL   AST 20 15 - 41 U/L   ALT 16 0 - 44 U/L   Alkaline Phosphatase 89 38 - 126 U/L   Total Bilirubin 0.4 0.3 - 1.2 mg/dL   GFR calc non Af Amer 35 (L) >60 mL/min   GFR calc Af Amer 40 (L) >60 mL/min   Anion gap 12 5 - 15    Comment: Performed at [place] [address], [place], [place] [address]  I-stat chem 8, ED     Status: Abnormal   Collection Time: 03/07/20  7:30 PM  Result Value Ref Range   Sodium 143 135 - 145 mmol/L   Potassium 4.1 3.5 - 5.1 mmol/L   Chloride 105 98 - 111 mmol/L   BUN 24 (H) 8 - 23 mg/dL   Creatinine, Ser 1.40 (H) 0.44 - 1.00 mg/dL   Glucose, Bld 103 (H) 70 - 99 mg/dL    Comment: Glucose reference range applies only to samples taken after fasting for at least 8 hours.   Calcium, Ion 1.23 1.15 - 1.40 mmol/L   TCO2 29 22 - 32 mmol/L   Hemoglobin 14.6 12.0 - 15.0 g/dL   HCT 43.0 36 - 46 %  CBG monitoring, ED     Status: None   Collection Time: 03/07/20  7:52 PM  Result Value Ref Range   Glucose-Capillary 92 70 - 99 mg/dL    Comment: Glucose reference range applies only to samples taken after fasting for at least 8 hours.  SARS Coronavirus 2 by RT PCR (hospital order, performed in [facility] hospital lab) Nasopharyngeal Nasopharyngeal Swab     Status: None   Collection Time: 03/07/20  7:55 PM   Specimen: Nasopharyngeal Swab  Result Value Ref Range   SARS Coronavirus 2 NEGATIVE NEGATIVE    Comment: (NOTE) SARS-CoV-2 target nucleic acids are NOT DETECTED. The SARS-CoV-2 RNA is generally detectable in upper and lower respiratory specimens during the acute phase of infection. The  lowest concentration of SARS-CoV-2 viral copies this assay can detect is 250 copies / mL. A negative result does not preclude SARS-CoV-2 infection and should not be used as the sole basis for treatment or other patient management decisions.  A negative result may occur with improper specimen collection / handling, submission of specimen other than nasopharyngeal swab, presence of viral mutation(s) within the areas targeted by this assay, and inadequate number of viral copies (<250 copies / mL). A negative result must be combined with clinical observations, patient history, and epidemiological information. Fact Sheet for Patients:   StrictlyIdeas.no Fact Sheet for Healthcare Providers: BankingDealers.co.za This test is not yet approved or cleared  by the [place] FDA and has been authorized for detection and/or diagnosis of SARS-CoV-2 by FDA under an Emergency Use Authorization (EUA).  This EUA will remain in effect (meaning this test can be used) for the duration of the COVID-19 declaration under Section 564(b)(1) of the Act, 21 U.S.C. section 360bbb-3(b)(1), unless the authorization is terminated or revoked sooner. Performed at [facility], [place] [address], [place], [place] [address]   MRSA PCR Screening     Status: None   Collection Time: 03/07/20 11:31 PM   Specimen: Nasopharyngeal  Result Value Ref  Range   MRSA by PCR NEGATIVE NEGATIVE    Comment:        The GeneXpert MRSA Assay (FDA approved for NASAL specimens only), is one component of a comprehensive MRSA colonization surveillance program. It is not intended to diagnose MRSA infection nor to guide or monitor treatment for MRSA infections. Performed at [facility], [place] [address], [place], [place] [address]     MR BRAIN WO CONTRAST  Result Date: 03/07/2020 CLINICAL DATA:  86 year old female with left side deficits, suspected stroke. History of colon,  ovarian, and melanoma malignancy reported. This is an outpatient who was scanned after hours, office of Dr. Marcial Pacas was contacted but is closed. EXAM: MRI HEAD WITHOUT CONTRAST TECHNIQUE: Multiplanar, multiecho pulse sequences of the brain and surrounding structures were obtained without intravenous contrast. COMPARISON:  Head CT 11/28/2018.  Brain MRI 05/20/2017. FINDINGS: Brain: Oval intra-axial masslike area of heterogeneous signal measuring about 46 x 35 x 33 mm (AP by transverse by CC) is centered at the right frontal operculum. There are internal areas of mixed hyperintense and hypointense T1/T2 and FLAIR signal. Some of the areas are restricted on diffusion (series 6 image 67) although there is no surrounding regional diffusion restriction. Much of the area is dark on susceptibility weighted imaging consistent with hemorrhage. There is surrounding T2 and FLAIR hyperintensity in a vasogenic edema pattern. There is no intraventricular extension of blood. No definite extra-axial extension of blood. Mass effect on the right lateral ventricle with no significant midline shift at this time (series 11, image 16). Basilar cisterns remain normal. There is no additional area of similar signal abnormality in the brain. And on SWI imaging no additional cerebral blood products are identified. No ventriculomegaly. Patchy, mild to moderate for age other nonspecific bilateral cerebral white matter T2 and FLAIR hyperintensity with no definite cortical encephalomalacia. T2 heterogeneity throughout the deep gray matter nuclei is chronic and more resembles perivascular spaces but a degree of chronic lacunar infarcts are also possible. Unchanged since 2018 small chronic infarct in the left cerebellum (series 10, image 8). Cervicomedullary junction and pituitary are within normal limits. Vascular: Major intracranial vascular flow voids are stable since 2018. Skull and upper cervical spine: Negative for age visible cervical  spine. Visualized bone marrow signal is within normal limits. Sinuses/Orbits: Postoperative changes to both globes, otherwise negative orbits. Paranasal Visualized paranasal sinuses and mastoids are stable and well pneumatized. Other: Visible internal auditory structures appear normal. Scalp and face soft tissues appear negative. IMPRESSION: 1. Solitary intra-axial hemorrhage versus hemorrhagic mass at the right operculum measuring up to 4.6 cm (volume 27 mL). Regional vasogenic edema and mild mass effect with no midline shift at this time. No extra-axial or intraventricular extension of blood. There is no larger surrounding area of ischemia to strongly suggest a hemorrhagic infarct, and this is indeterminate for a small vessel versus tumor related bleed. Post-contrast MRI images would be valuable, although a repeat MRI without and with contrast once the hematoma has resolved would be most specific. 2. Evidence of chronic small vessel disease otherwise stable since the 2018 MRI. I recommended this patient be seen in an Emergency Department, and her family was agreeable to transport her by private vehicle from [place] to the nearby [facility] ED. In anticipation of the patient's arrival, salient findings were discussed by telephone with the [facility] ED Charge Nurse on 03/07/2020 at 18:33 . Electronically Signed   By: Genevie Ann M.D.   On: 03/07/2020 18:42  MR BRAIN W CONTRAST  Result Date: 03/08/2020 CLINICAL DATA:  Intracranial mass EXAM: MRI HEAD WITH CONTRAST TECHNIQUE: Multiplanar, multiecho pulse sequences of the brain and surrounding structures were obtained with intravenous contrast. CONTRAST:  4.34m GADAVIST GADOBUTROL 1 MMOL/ML IV SOLN COMPARISON:  Brain MRI without contrast 03/07/2020 FINDINGS: Unchanged size of hemorrhage centered at the right frontoparietal junction. There is an area of nodular contrast enhancement within the posterior aspect of the hemorrhage that measures 1.6 x 1.3 cm (series 7, image  37). No other site of abnormal contrast enhancement. The degree of surrounding edema is unchanged. No midline shift. IMPRESSION: 1. Unchanged size of hemorrhage centered at the right frontoparietal junction. There is an area of nodular contrast enhancement within the posterior aspect of the hemorrhage, likely indicating the presence of tumor. Follow-up study when the hematoma has had time to resolve is suggested. 2. No other contrast-enhancing lesion. Electronically Signed   By: KUlyses JarredM.D.   On: 03/08/2020 03:34   VAS [place]  Result Date: 03/06/2020 Carotid Arterial Duplex Study Indications:       Numbness. Risk Factors:      Hypertension, hyperlipidemia, PAD. Comparison Study:  No prior study Performing Technologist: MMaudry MayhewMHA, RDMS, RVT, RDCS  Examination Guidelines: A complete evaluation includes B-mode imaging, spectral Doppler, color Doppler, and power Doppler as needed of all accessible portions of each vessel. Bilateral testing is considered an integral part of a complete examination. Limited examinations for reoccurring indications may be performed as noted.  Right Carotid Findings: +----------+--------+-------+--------+--------------------------------+--------+           PSV cm/sEDV    StenosisPlaque Description              Comments                   cm/s                                                    +----------+--------+-------+--------+--------------------------------+--------+ CCA Prox  78      9                                                       +----------+--------+-------+--------+--------------------------------+--------+ CCA Distal61      12             smooth, heterogenous and                                                  calcific                                 +----------+--------+-------+--------+--------------------------------+--------+ ICA Prox  59      11             smooth, heterogenous and  calcific                                 +----------+--------+-------+--------+--------------------------------+--------+ ICA Distal76      17                                                      +----------+--------+-------+--------+--------------------------------+--------+ ECA       166     14             heterogenous                             +----------+--------+-------+--------+--------------------------------+--------+ +----------+--------+-------+----------------+-------------------+           PSV cm/sEDV cmsDescribe        Arm Pressure (mmHG) +----------+--------+-------+----------------+-------------------+ [id number]             Multiphasic, WNL                    +----------+--------+-------+----------------+-------------------+ +---------+--------+--+--------+--+---------+ VertebralPSV cm/s77EDV cm/s16Antegrade +---------+--------+--+--------+--+---------+  Left Carotid Findings: +----------+-------+-------+--------+---------------------------------+--------+           PSV    EDV    StenosisPlaque Description               Comments           cm/s   cm/s                                                     +----------+-------+-------+--------+---------------------------------+--------+ CCA Prox  80     12                                                       +----------+-------+-------+--------+---------------------------------+--------+ CCA Distal51     13             smooth and heterogenous                   +----------+-------+-------+--------+---------------------------------+--------+ ICA Prox  55     12             heterogenous, irregular and                                               calcific                                  +----------+-------+-------+--------+---------------------------------+--------+ ICA Distal117    32                                                        +----------+-------+-------+--------+---------------------------------+--------+ ECA  63     3                                                        +----------+-------+-------+--------+---------------------------------+--------+ +----------+--------+--------+----------------+-------------------+           PSV cm/sEDV cm/sDescribe        Arm Pressure (mmHG) +----------+--------+--------+----------------+-------------------+ [id number]              Multiphasic, WNL                    +----------+--------+--------+----------------+-------------------+ +---------+--------+--+--------+-+---------+ VertebralPSV cm/s40EDV cm/s7Antegrade +---------+--------+--+--------+-+---------+   Summary: Right Carotid: Velocities in the right ICA are consistent with a 1-39% stenosis. Left Carotid: Velocities in the left ICA are consistent with a 1-39% stenosis. Vertebrals:  Bilateral vertebral arteries demonstrate antegrade flow. Subclavians: Normal flow hemodynamics were seen in bilateral subclavian              arteries. *See table(s) above for measurements and observations.  Electronically signed by Deitra Mayo MD on 03/06/2020 at 4:05:55 PM.    Final     Review of Systems - Per HPI  Blood pressure (!) 146/110, pulse 65, temperature 98 F (36.7 C), temperature source Oral, resp. rate 14, height 5' 4" (1.626 m), weight 42.1 kg, SpO2 97 %.  Physical Exam: She is A/O X4, conversant, and in good spirits. Decreased sensation to pin prick on left lower face with slight left-sided facial droop. Mild dysarthria and slight expressive aphasia is present. Visual fields grossly normal. Pupils 3, equal, round, reactive to light and accommodation. Hearing normal bilaterally, uvula rises symmetrically, bilateral shoulder shrug present, and tongue protrudes in midline. Mild LUE paresthesia. MAEW with good strength and symmetry. Strength in her BUE and BLE 5/5. No ataxia present.    Assessment/Plan: There is a tumor centered at the right frontoparietal junction with surrounding hemorrhage and vasogenic edema that is highly suggestive of metastases from melanoma. Mild mass effect with slight midline shift and effaced lateral ventricle.  Decadron per neurology. Will do metastatic workup. Plan to have discussion with patient and family this afternoon to discuss treatment options.   I met with patient and have discussed treatment options.  Full treatment would involve preoperative stereotactic radiosurgery followed by craniotomy with tumor resection.  A middle approach would be fractionated radiosurgery without surgery.  The metastatic workup will help [place] know extent of disease and this may help [place] with treatment decisions.  I have discussed patient with Rad Onc and also initiated palliative care evaluation with Wadie Lessen.  I will call daughter, [name], to discuss the patient's situation and treatment options.    Peggyann Shoals, MD 03/08/2020, 12:18 PM

## 2020-03-08 NOTE — Telephone Encounter (Signed)
I spoke to her son, Ritchie Mccullar. He really appreciated the call to check up on his mother. They have a meeting planned to discuss what will be in her best interest. He said they just want her comfortable and in the least amount of pain as possible.

## 2020-03-08 NOTE — Evaluation (Signed)
Speech Language Pathology Evaluation Patient Details Name: Jessica [name] MRN: [id number] DOB: 1932/04/10 Today's Date: 03/08/2020 Time: 9678-9381 SLP Time Calculation (min) (ACUTE ONLY): 9 min  Problem List:  Patient Active Problem List   Diagnosis Date Noted  . ICH (intracerebral hemorrhage) ([place]) 03/07/2020  . Left sided numbness 02/28/2020  . Left-sided weakness 02/28/2020  . Stroke-like symptom 02/22/2020  . Hypomagnesemia 12/29/2018  . Hypokalemia 12/09/2018  . BPPV (benign paroxysmal positional vertigo) 12/09/2018  . Compression fracture of T9 vertebra ([place]) 09/02/2018  . CKD (chronic kidney disease) stage 3, GFR 30-59 ml/min 02/23/2018  . Need for pneumococcal vaccine 02/23/2018  . Prediabetes 02/23/2018  . Malnutrition ([place]) 01/04/2018  . Hyperglycemia 10/06/2017  . Episode of recurrent major depressive disorder ([place]) 10/06/2017  . Nonintractable epilepsy without status epilepticus ([place]) 07/14/2017  . Anemia 05/28/2017  . Loss of weight 01/18/2015  . Osteoarthritis of finger 02/16/2014  . Peripheral vascular disease ([place])   . Depression with anxiety   . Hyperlipidemia LDL goal <130   . Cancer ([place])   . Confusion   . Alcohol abuse   . Tachycardia   . Generalized convulsive epilepsy ([place])   . GERD (gastroesophageal reflux disease) 02/03/2013  . Personal history of colon cancer 10/03/2011  . Bronchitis 09/26/2011  . COPD (chronic obstructive pulmonary disease) ([place]) 09/26/2011  . Hypertension 09/26/2011  . Hyperlipemia 09/26/2011  . Tobacco abuse 09/26/2011  . Diarrhea 09/26/2011   Past Medical History:  Past Medical History:  Diagnosis Date  . Alcohol abuse 11/11/2010  . Alcohol abuse, unspecified   . Anxiety   . Atrial fibrillation ([place]) 11/11/2010  . Bone infection of left hand ([place]) 12/22/2008   secondary to injury  . Cancer ([place])    colon cancer-hx of chemo  . Chronic airway obstruction, not elsewhere classified 10/01/2011  . Confusion   . COPD  (chronic obstructive pulmonary disease) ([place])   . Dizziness and giddiness 04/24/2005  . Edema 12/03/2011  . Embolism - blood clot 1996   left breast  . Generalized convulsive epilepsy without mention of intractable epilepsy   . GERD (gastroesophageal reflux disease)   . Hemorrhoids   . Hyperlipidemia   . Hypertension   . Insomnia, unspecified 11/11/2010  . Major depressive disorder, single episode, unspecified 11/11/2010  . Malignant neoplasm of stomach, unspecified site 11/11/2010  . Melanoma ([place])    metastatic  . Peripheral vascular disease ([place])   . Seizures ([place])   . Symptomatic menopausal or female climacteric states 11/11/2010  . Tachycardia, unspecified   . Tobacco use disorder   . Urgency of urination    Past Surgical History:  Past Surgical History:  Procedure Laterality Date  . ABDOMINAL HYSTERECTOMY  1963  . BLADDER REPAIR    . breast biopsies     x2 on left, x 1 on right  . c-sections  1957, 1959, 1961   x3  . COLON RESECTION  1995  . COLONOSCOPY  10/21/2012   Dr. Paulita [name] diverticulosis, sessile polyps biopsy   . heart ablation  1997  . HEMORRHOID SURGERY    . laser surgery to right eye  10/04/2007  . left femoral artery stent  05/07/2004  . melanoma removed from left groin  09/2004  . melanoma removed from left leg  09/06/2004  . orif left humerus fracture  03/27/2005  . porta cath placement  1995  . porta cath removal  1996  . removal of tumor in left knee  11/15/2010  . skin graft to  left leg  12/05/2004  . VOCAL CORD INJECTION  1996, 1997   HPI:  Jessica [name] is a 86 y.o. female with past medical history of colon cancer, metastatic melanoma fibrillation not on anticoagulation, hypertension, alcohol abuse, COPD, seizures with abrupt onset left-sided weakness and numbness around 2 weeks presents to emergency department after MRI brain outpatient revealed a moderate sized right frontoparietal hemorrhage.   Assessment / Plan / Recommendation Clinical Impression   Pt presents with a mild dysarthria due to CN VII weakness. Her language is fluent and appears intact, but pt reports some difficulty finding words. She communicated very well throughout assessment other than mildly imprecise articulation. Memory, cognition appears in tact with verbal only acitivites. Pt is not sure if she wants f/u with SLP after d/c to address speech. Will f/u for needs.     SLP Assessment  SLP Recommendation/Assessment: Patient needs continued Speech Lanaguage Pathology Services SLP Visit Diagnosis: Dysarthria and anarthria (R47.1)    Follow Up Recommendations       Frequency and Duration min 2x/week  1 week      SLP Evaluation Cognition  Overall Cognitive Status: Within Functional Limits for tasks assessed       Comprehension  Auditory Comprehension Overall Auditory Comprehension: Appears within functional limits for tasks assessed    Expression Verbal Expression Overall Verbal Expression: Appears within functional limits for tasks assessed   Oral / Motor  Oral Motor/Sensory Function Overall Oral Motor/Sensory Function: Mild impairment Facial ROM: Reduced left;Suspected CN VII (facial) dysfunction Facial Symmetry: Abnormal symmetry left;Suspected CN VII (facial) dysfunction Lingual ROM: Within Functional Limits Lingual Symmetry: Within Functional Limits Lingual Strength: Within Functional Limits Lingual Sensation: Within Functional Limits Motor Speech Overall Motor Speech: Impaired Respiration: Within functional limits Phonation: Breathy Resonance: Within functional limits Articulation: Impaired Level of Impairment: Phrase Intelligibility: Intelligible Motor Planning: Witnin functional limits Motor Speech Errors: Aware   GO                   Jessica [place], MA CCC-SLP  Acute Rehabilitation Services Pager [phone] Office [phone]  Jessica [name] 03/08/2020, 1:42 PM

## 2020-03-08 NOTE — Progress Notes (Signed)
OT Cancellation Note  Patient Details Name: Jessica [name] MRN: [id number] DOB: 1932/05/09   Cancelled Treatment:    Reason Eval/Treat Not Completed: Active bedrest order  [place], OT/L   Acute OT Clinical Specialist Acute Rehabilitation Services Pager [phone] Office [phone]  03/08/2020, 9:34 AM

## 2020-03-08 NOTE — Progress Notes (Signed)
STROKE TEAM PROGRESS NOTE   INTERVAL HISTORY I have personally reviewed history of presenting illness with the patient in details, electronic medical records and imaging films in PACS.  She presented with a 2-week history of left-sided hand paresthesias and yesterday noticed some facial droop and weakness and some hand weakness.  MRI scan done as an outpatient showed a hemorrhagic right frontal mass and postcontrast imaging shows no dural enhancement suspicious for underlying tumor with hemorrhage.  She does have prior history of melanoma.  Vitals:   03/08/20 1154 03/08/20 1200 03/08/20 1300 03/08/20 1400  BP: 130/63 (!) 146/110 (!) 163/95 (!) 158/69  Pulse: 71 65 (!) 39 64  Resp:  14 19 17   Temp:      TempSrc:      SpO2:  97% (!) 89% 97%  Weight:      Height:        CBC:  Recent Labs  Lab 03/07/20 1921 03/07/20 1930  WBC 8.3  --   NEUTROABS 5.8  --   HGB 13.7 14.6  HCT 44.6 43.0  MCV 92.7  --   PLT 255  --     Basic Metabolic Panel:  Recent Labs  Lab 03/07/20 1921 03/07/20 1930  NA 136 143  K 4.0 4.1  CL 100 105  CO2 24  --   GLUCOSE 104* 103*  BUN 22 24*  CREATININE 1.38* 1.40*  CALCIUM 9.6  --     IMAGING past 24 hours MR BRAIN WO CONTRAST  Result Date: 03/07/2020 CLINICAL DATA:  86 year old female with left side deficits, suspected stroke. History of colon, ovarian, and melanoma malignancy reported. This is an outpatient who was scanned after hours, office of Dr. Marcial Pacas was contacted but is closed. EXAM: MRI HEAD WITHOUT CONTRAST TECHNIQUE: Multiplanar, multiecho pulse sequences of the brain and surrounding structures were obtained without intravenous contrast. COMPARISON:  Head CT 11/28/2018.  Brain MRI 05/20/2017. FINDINGS: Brain: Oval intra-axial masslike area of heterogeneous signal measuring about 46 x 35 x 33 mm (AP by transverse by CC) is centered at the right frontal operculum. There are internal areas of mixed hyperintense and hypointense T1/T2 and FLAIR  signal. Some of the areas are restricted on diffusion (series 6 image 67) although there is no surrounding regional diffusion restriction. Much of the area is dark on susceptibility weighted imaging consistent with hemorrhage. There is surrounding T2 and FLAIR hyperintensity in a vasogenic edema pattern. There is no intraventricular extension of blood. No definite extra-axial extension of blood. Mass effect on the right lateral ventricle with no significant midline shift at this time (series 11, image 16). Basilar cisterns remain normal. There is no additional area of similar signal abnormality in the brain. And on SWI imaging no additional cerebral blood products are identified. No ventriculomegaly. Patchy, mild to moderate for age other nonspecific bilateral cerebral white matter T2 and FLAIR hyperintensity with no definite cortical encephalomalacia. T2 heterogeneity throughout the deep gray matter nuclei is chronic and more resembles perivascular spaces but a degree of chronic lacunar infarcts are also possible. Unchanged since 2018 small chronic infarct in the left cerebellum (series 10, image 8). Cervicomedullary junction and pituitary are within normal limits. Vascular: Major intracranial vascular flow voids are stable since 2018. Skull and upper cervical spine: Negative for age visible cervical spine. Visualized bone marrow signal is within normal limits. Sinuses/Orbits: Postoperative changes to both globes, otherwise negative orbits. Paranasal Visualized paranasal sinuses and mastoids are stable and well pneumatized. Other: Visible internal auditory  structures appear normal. Scalp and face soft tissues appear negative. IMPRESSION: 1. Solitary intra-axial hemorrhage versus hemorrhagic mass at the right operculum measuring up to 4.6 cm (volume 27 mL). Regional vasogenic edema and mild mass effect with no midline shift at this time. No extra-axial or intraventricular extension of blood. There is no larger  surrounding area of ischemia to strongly suggest a hemorrhagic infarct, and this is indeterminate for a small vessel versus tumor related bleed. Post-contrast MRI images would be valuable, although a repeat MRI without and with contrast once the hematoma has resolved would be most specific. 2. Evidence of chronic small vessel disease otherwise stable since the 2018 MRI. I recommended this patient be seen in an Emergency Department, and her family was agreeable to transport her by private vehicle from [place] to the nearby [facility] ED. In anticipation of the patient's arrival, salient findings were discussed by telephone with the [facility] ED Charge Nurse on 03/07/2020 at 18:33 . Electronically Signed   By: Genevie Ann M.D.   On: 03/07/2020 18:42   MR BRAIN W CONTRAST  Result Date: 03/08/2020 CLINICAL DATA:  Intracranial mass EXAM: MRI HEAD WITH CONTRAST TECHNIQUE: Multiplanar, multiecho pulse sequences of the brain and surrounding structures were obtained with intravenous contrast. CONTRAST:  4.25mL GADAVIST GADOBUTROL 1 MMOL/ML IV SOLN COMPARISON:  Brain MRI without contrast 03/07/2020 FINDINGS: Unchanged size of hemorrhage centered at the right frontoparietal junction. There is an area of nodular contrast enhancement within the posterior aspect of the hemorrhage that measures 1.6 x 1.3 cm (series 7, image 37). No other site of abnormal contrast enhancement. The degree of surrounding edema is unchanged. No midline shift. IMPRESSION: 1. Unchanged size of hemorrhage centered at the right frontoparietal junction. There is an area of nodular contrast enhancement within the posterior aspect of the hemorrhage, likely indicating the presence of tumor. Follow-up study when the hematoma has had time to resolve is suggested. 2. No other contrast-enhancing lesion. Electronically Signed   By: Ulyses Jarred M.D.   On: 03/08/2020 03:34    PHYSICAL EXAM Pleasant frail elderly Caucasian lady not in distress. . Afebrile. Head is  nontraumatic. Neck is supple without bruit.    Cardiac exam no murmur or gallop. Lungs are clear to auscultation. Distal pulses are well felt. Neurological Exam : Awake alert oriented to time place and person.  Mild dysarthria but no aphasia apraxia.  Follows commands well.  Diminished attention registration and recall.  Extraocular movements full range without nystagmus.  Mild left lower facial weakness.  Tongue midline.  Motor system exam shows no upper or lower extremity drift but mild weakness of left grip and intrinsic hand muscles.  Orbits right over left upper extremity.  Mild subjective diminished sensation in the left hand only.  Gait not tested. ASSESSMENT/PLAN [name] is a 86 y.o. female with history of colon cancer, metastatic melanoma, atrial fibrillation not on anticoagulation, hypertension, alcohol abuse, COPD, seizures presenting with 2wk hx of abrupt onset left-sided weakness and numbness. MRI done as an OP shows hemorrhage. Admitted to ICU.    Right frontoparietal brain tumor w/ hemorrhage suspicious for metastasis from melanoma-hemorrhagic infarct from A. fib less likely  MRI w/o solitary intra-axial hemorrhage vs hemorrhagic mas R operculum. Mild mass effect, no shift. Small vessel disease.   MRI w/ unchanged size hemorrhage. Nodular contrast enhancement posterior aspect of hemorrhage likely tumor.   Carotid Doppler  B ICA 1-39% stenosis, VAs antegrade   2D Echo pending   LDL  121  HgbA1c 5.7  SCDs for VTE prophylaxis  aspirin 81 mg daily prior to admission, now on No antithrombotic given hemorrhage   Therapy recommendations:  SLP - ok to be OOB  Disposition:  pending   Neurosurgeon consult to assess mass  Transfer to floor when stable off cleviprex  Atrial Fibrillation  Home anticoagulation:  none , on aspirin 81  Not an AC candidate given hemorrhage   Hypertension  Home meds:  Losartan 100, metoprolol 25  On cleviprex   Increase SBP  goal to 160  Add prn labetalol and hydralazine  Wean cardene  Risk of re-hemorrhage low  Hyperlipidemia  Home meds:  No statin  LDL 121  Consider statin at discharge  Other Stroke Risk Factors  Advanced age  Cigarette smoker, advised to stop smoking  Hx ETOH use, alcohol level <5, advised to drink no more than 1 drink(s) a day  PVD  Other Active Problems  Hx colon cancer   Hx metastatic melanoma   Hx seizures on lamictal, only name brand works per pt  Anxiety  AKI +/- CKD Cre 1.4  Hospital day # 1 I had a long discussion the patient regarding her clinical presentation discussed MRI findings and differential diagnosis and answered questions.  Recommend close neurological monitoring and strict blood pressure control but wean Cleviprex drip and start oral and as needed IV medications.  Add Decadron 4 mg every 6 hourly and Protonix.  DVT and GI prophylaxis.  Neurosurgery consult.    Discussed with Dr.Stern.  I spoke to the patient's daughter who is the health power of attorney and discuss whether further work-up including chest abdomen pelvis to look for melanoma elsewhere would impact patient's decision about plan of care.  She feels that her mother has had cancer 4 times and probably does not want to fight it anymore.  She and her brother are going to speak to her mother tonight and will let me know about that decision in the morning.  Hence will hold off on further testing to look for systemic cancer This patient is critically ill and at significant risk of neurological worsening, death and care requires constant monitoring of vital signs, hemodynamics,respiratory and cardiac monitoring, extensive review of multiple databases, frequent neurological assessment, discussion with family, other specialists and medical decision making of high complexity.I have made any additions or clarifications directly to the above note.This critical care time does not reflect procedure time, or  teaching time or supervisory time of PA/NP/Med Resident etc but could involve care discussion time.  I spent 35 minutes of neurocritical care time  in the care of  this patient.   Antony Contras, MD  To contact Stroke Continuity provider, please refer to [email or web address] After hours, contact General Neurology

## 2020-03-08 NOTE — Progress Notes (Signed)
Patient back from CT. And resting comfortably in bed.

## 2020-03-08 NOTE — Evaluation (Signed)
Physical Therapy Evaluation Patient Details Name: Jessica [name] MRN: [id number] DOB: March 14, 1932 Today's Date: 03/08/2020   History of Present Illness  86 y.o. female with past medical history of colon cancer, metastatic melanoma fibrillation not on anticoagulation, hypertension, alcohol abuse, COPD, seizures with abrupt onset left-sided weakness and numbness around 2 weeks presents to emergency department after MRI brain outpatient revealed a moderate sized right frontoparietal hemorrhage. MRI  - Unchanged size of hemorrhage centered at the right frontoparietaljunction. There is an area of nodular contrast enhancement withinthe posterior aspect of the hemorrhage, likely indicating the presence of tumor..   Clinical Impression  Pt admitted with/for L sided weakness due to tumor hemorrhage.  Pt not presently at baseline and needs as much as light mod assist at times for basic mobility.  Pt currently limited functionally due to the problems listed. ( See problems list.)   Pt will benefit from PT to maximize function and safety in order to get ready for next venue listed below.     Follow Up Recommendations SNF    Equipment Recommendations   (TBA)    Recommendations for Other Services       Precautions / Restrictions Precautions Precautions: Fall Restrictions Weight Bearing Restrictions: No      Mobility  Bed Mobility Overal bed mobility: Modified Independent                Transfers Overall transfer level: Needs assistance   Transfers: Sit to/from Stand Sit to Stand: Min guard            Ambulation/Gait Ambulation/Gait assistance: Min assist Gait Distance (Feet): 40 Feet Assistive device: 1 person hand held assist Gait Pattern/deviations: Step-through pattern Gait velocity: slower Gait velocity interpretation: <1.31 ft/sec, indicative of household ambulator General Gait Details: mildly unsteady overall, drift, mild scissoring  Stairs             Wheelchair Mobility    Modified Rankin (Stroke Patients Only) Modified Rankin (Stroke Patients Only) Pre-Morbid Rankin Score: No symptoms Modified Rankin: Moderate disability     Balance Overall balance assessment: Needs assistance (though not likely to fit criteria)   Sitting balance-Leahy Scale: Good       Standing balance-Leahy Scale: Fair                               Pertinent Vitals/Pain      Home Living Family/patient expects to be discharged to:: Skilled nursing facility     Type of Home: House         Home Equipment: None      Prior Function Level of Independence: Independent         Comments: Lives in [place] at [facility]; walked to dining room until Covid, now has meals delivered     Hand Dominance   Dominant Hand: Right    Extremity/Trunk Assessment        Lower Extremity Assessment Lower Extremity Assessment: LLE deficits/detail LLE Deficits / Details: mildly weak L LE, but functional.    Cervical / Trunk Assessment Cervical / Trunk Assessment: Kyphotic  Communication   Communication: Expressive difficulties (dysarthric)  Cognition Arousal/Alertness: Awake/alert Behavior During Therapy: Impulsive Overall Cognitive Status: Impaired/Different from baseline Area of Impairment: Attention;Safety/judgement;Awareness                   Current Attention Level: Selective     Safety/Judgement: Decreased awareness of deficits;Decreased awareness of safety Awareness: Emergent  General Comments: Pt had difficulty understanding why OT was assessing her if she was going to have "brain surgery to remove the problem and everything would be better". Decreased awareness of amount of difficulty she is having using L hand; Pt educated ot use call bell twhen she wanted to go back to bed - pt getting out of chair unassisted without calling for help      General Comments General comments (skin integrity, edema,  etc.): vss    Exercises     Assessment/Plan    PT Assessment Patient needs continued PT services  PT Problem List Decreased strength;Decreased activity tolerance;Decreased balance;Decreased mobility;Decreased coordination       PT Treatment Interventions Gait training;Functional mobility training;Therapeutic activities;Therapeutic exercise;Balance training;Neuromuscular re-education;Patient/family education;DME instruction    PT Goals (Current goals can be found in the Care Plan section)  Acute Rehab PT Goals Patient Stated Goal: to have brain surgery PT Goal Formulation: With patient Time For Goal Achievement: 03/22/20 Potential to Achieve Goals: Good    Frequency Min 3X/week   Barriers to discharge        Co-evaluation               AM-PAC PT "6 Clicks" Mobility  Outcome Measure Help needed turning from your back to your side while in a flat bed without using bedrails?: None Help needed moving from lying on your back to sitting on the side of a flat bed without using bedrails?: None Help needed moving to and from a bed to a chair (including a wheelchair)?: A Little Help needed standing up from a chair using your arms (e.g., wheelchair or bedside chair)?: A Little Help needed to walk in hospital room?: A Little Help needed climbing 3-5 steps with a railing? : A Lot 6 Click Score: 19    End of Session   Activity Tolerance: Patient tolerated treatment well Patient left: in chair;with call bell/phone within reach;with chair alarm set Nurse Communication: Mobility status PT Visit Diagnosis: Unsteadiness on feet (R26.81);Difficulty in walking, not elsewhere classified (R26.2);Hemiplegia and hemiparesis Hemiplegia - Right/Left: Left Hemiplegia - dominant/non-dominant: Non-dominant Hemiplegia - caused by: Unspecified    Time: 1962-2297 PT Time Calculation (min) (ACUTE ONLY): 27 min   Charges:   PT Evaluation $PT Eval Moderate Complexity: 1 Mod PT  Treatments $Gait Training: 8-22 mins        03/08/2020  Jessica Carne., PT Acute Rehabilitation Services [phone]  (pager) [phone]  (office)  Jessica [name] 03/08/2020, 10:28 PM

## 2020-03-08 NOTE — Progress Notes (Signed)
Pt's Ipad and Ipad charger taken to security.

## 2020-03-08 NOTE — Progress Notes (Addendum)
Occupational Therapy Evaluation Patient Details Name: Jessica [name] MRN: [id number] DOB: 1932-04-05 Today's Date: 03/08/2020    History of Present Illness 86 y.o. female with past medical history of colon cancer, metastatic melanoma fibrillation not on anticoagulation, hypertension, alcohol abuse, COPD, seizures with abrupt onset left-sided weakness and numbness around 2 weeks presents to emergency department after MRI brain outpatient revealed a moderate sized right frontoparietal hemorrhage. MRI  - Unchanged size of hemorrhage centered at the right frontoparietaljunction. There is an area of nodular contrast enhancement withinthe posterior aspect of the hemorrhage, likely indicating the presence of tumor..    Clinical Impression   PTA, pt lived alone at [place] and was independent with ADL and mobility. Pt presents with apparent sensorimotor deficits, impaired balance and decreased insight/awareness into deficits and and how they impact her safety and ability to function. Pt states she will not "need occupational therapy because she is having brain surgery to take out the problem". Recommend rehab at [facility] SNF to maximize functional level of independence. SpO2 85 on RA with ambulation to sink for ADL. 2/4 DOE. Will follow acutely.     Follow Up Recommendations  SNF;Supervision/Assistance - 24 hour    Equipment Recommendations  3 in 1 bedside commode (to use as shower seat)    Recommendations for Other Services       Precautions / Restrictions Precautions Precautions: Fall      Mobility Bed Mobility Overal bed mobility: Modified Independent                Transfers Overall transfer level: Needs assistance   Transfers: Sit to/from Stand Sit to Stand: Min guard              Balance Overall balance assessment: Needs assistance   Sitting balance-Leahy Scale: Good       Standing balance-Leahy Scale: Fair                              ADL either performed or assessed with clinical judgement   ADL Overall ADL's : Needs assistance/impaired Eating/Feeding: Set up;Sitting   Grooming: Minimal assistance   Upper Body Bathing: Minimal assistance;Sitting   Lower Body Bathing: Minimal assistance;Sit to/from stand   Upper Body Dressing : Minimal assistance;Sitting   Lower Body Dressing: Minimal assistance;Sit to/from stand   Toilet Transfer: Minimal assistance;Ambulation   Toileting- Clothing Manipulation and Hygiene: Minimal assistance       Functional mobility during ADLs: Minimal assistance;Cueing for safety       Vision Baseline Vision/History: Wears glasses Wears Glasses: Reading only Vision Assessment?: Yes Eye Alignment: Within Functional Limits Ocular Range of Motion: Within Functional Limits Alignment/Gaze Preference: Within Defined Limits Tracking/Visual Pursuits: Decreased smoothness of horizontal tracking;Decreased smoothness of vertical tracking Saccades: Additional eye shifts occurred during testing;Additional head turns occurred during testing;Decreased speed of saccadic movement Convergence: Within functional limits Visual Fields: No apparent deficits Additional Comments: poor visual attention     Perception Perception Spatial deficits:  (will further assess)   Praxis Praxis Praxis tested?: Deficits Deficits: Limb apraxia    Pertinent Vitals/Pain Pain Assessment: No/denies pain     Hand Dominance Right   Extremity/Trunk Assessment Upper Extremity Assessment Upper Extremity Assessment: LUE deficits/detail LUE Deficits / Details: apparent sensorimotor deficits LUE; decreased in-hand manipulation skills and fine motor tasks; Difficulty maintaining grasp on objects and unaware she has dropped objects; Strength overall functional LUE Sensation: decreased light touch;decreased proprioception LUE  Coordination: decreased fine motor   Lower Extremity  Assessment Lower Extremity Assessment: Defer to PT evaluation   Cervical / Trunk Assessment Cervical / Trunk Assessment: Kyphotic   Communication Communication Communication: Expressive difficulties (dysarthric)   Cognition Arousal/Alertness: Awake/alert Behavior During Therapy: Impulsive Overall Cognitive Status: Impaired/Different from baseline Area of Impairment: Attention;Safety/judgement;Awareness                   Current Attention Level: Selective     Safety/Judgement: Decreased awareness of deficits;Decreased awareness of safety Awareness: Emergent   General Comments: Pt had difficulty understanding why OT was assessing her if she was going to have "brain surgery to remove the problem and everything would be better". Decreased awareness of amount of difficulty she is having using L hand; Pt educated to use call bell when she wanted to go back to bed - pt getting out of chair unassisted without calling for help.  Pt lethargic at beginning of session requiring cues to keep eyes open   General Comments       Exercises     Shoulder Instructions      Home Living Family/patient expects to be discharged to:: Skilled nursing facility     Type of Home: House                       Home Equipment: None      Lives With: Alone    Prior Functioning/Environment Level of Independence: Independent        Comments: Lives in [place] at [facility]; walked to dining room until Covid, now has meals delivered        OT Problem List: Impaired balance (sitting and/or standing);Decreased coordination;Decreased cognition;Decreased safety awareness;Impaired sensation;Impaired UE functional use;Cardiopulmonary status limiting activity;Decreased strength      OT Treatment/Interventions: Self-care/ADL training;Therapeutic exercise;Neuromuscular education;DME and/or AE instruction;Therapeutic activities;Energy conservation;Patient/family education;Balance  training;Cognitive remediation/compensation    OT Goals(Current goals can be found in the care plan section) Acute Rehab OT Goals Patient Stated Goal: to have brain surgery OT Goal Formulation: With patient Time For Goal Achievement: 03/22/20 Potential to Achieve Goals: Good  OT Frequency: Min 2X/week   Barriers to D/C:            Co-evaluation              AM-PAC OT "6 Clicks" Daily Activity     Outcome Measure Help from another person eating meals?: A Little Help from another person taking care of personal grooming?: A Little Help from another person toileting, which includes using toliet, bedpan, or urinal?: A Little Help from another person bathing (including washing, rinsing, drying)?: A Little Help from another person to put on and taking off regular upper body clothing?: A Little Help from another person to put on and taking off regular lower body clothing?: A Little 6 Click Score: 18   End of Session Equipment Utilized During Treatment: Gait belt Nurse Communication: Mobility status  Activity Tolerance: Patient tolerated treatment well Patient left: in chair;with call bell/phone within reach;with chair alarm set  OT Visit Diagnosis: Unsteadiness on feet (R26.81);Other abnormalities of gait and mobility (R26.89);Muscle weakness (generalized) (M62.81);Other symptoms and signs involving cognitive function;Apraxia (R48.2)                Time: 6606-3016 OT Time Calculation (min): 21 min Charges:  OT General Charges $OT Visit: 1 Visit OT Evaluation $OT Eval Moderate Complexity: [place], OT/L   Acute OT Clinical Specialist  Acute Rehabilitation Services Pager [phone] Office [phone]   [facility] 03/08/2020, 5:28 PM

## 2020-03-08 NOTE — Telephone Encounter (Signed)
IMPRESSION: 1. Solitary intra-axial hemorrhage versus hemorrhagic mass at the right operculum measuring up to 4.6 cm (volume 27 mL). Regional vasogenic edema and mild mass effect with no midline shift at this time. No extra-axial or intraventricular extension of blood. There is no larger surrounding area of ischemia to strongly suggest a hemorrhagic infarct, and this is indeterminate for a small vessel versus tumor related bleed. Post-contrast MRI images would be valuable, although a repeat MRI without and with contrast once the hematoma has resolved would be most specific.  2. Evidence of chronic small vessel disease otherwise stable since the 2018 MRI.  I recommended this patient be seen in an Emergency Department, and her family was agreeable to transport her by private vehicle from [place] to the nearby [facility] ED. In anticipation of the patient's arrival, salient findings were discussed by telephone with the [facility] ED Charge Nurse on 03/07/2020 at 18:33 .   Electronically Signed   By: Genevie Ann M.D.   On: 03/07/2020 18:42  Please call her son Devona Konig [phone], updated him on current information  I also called her daughter also her [place] at [phone], left message

## 2020-03-08 NOTE — Progress Notes (Signed)
Initial Nutrition Assessment  DOCUMENTATION CODES:   Underweight, Severe malnutrition in context of chronic illness  INTERVENTION:   - Ensure Enlive po TID, each supplement provides 350 kcal and 20 grams of protein  - Encourage adequate PO intake  NUTRITION DIAGNOSIS:   Severe Malnutrition related to chronic illness (COPD) as evidenced by severe fat depletion, severe muscle depletion.  GOAL:   Patient will meet greater than or equal to 90% of their needs  MONITOR:   PO intake, Supplement acceptance, Labs, Weight trends  REASON FOR ASSESSMENT:   Other (underweight BMI)    ASSESSMENT:   86 year old female who presented to the ED on 6/09 with hand numbness. PMH of colon cancer, metastatic melanoma, HTN, HLD, GERD, EtOH abuse, COPD, seizures.   Discussed pt with RN and during ICU rounds. Pt with right frontal hemorrhage vs hemorrhagic mass.  Spoke with pt at bedside. Pt reports having a poor appetite and states that this is her baseline. Pt states that she lives at a facility and that the food there is not good. Pt reports that she did eat some of her breakfast this morning. Meal completion recorded as 50%.  Pt endorses weight loss but is unsure of timeframe ("it's been a while"). Pt reports her UBW as 120 lbs but is not sure when she last weighed this. Per chart review, weight has fluctuated between 40-43 kg over the last year with no real trend. Pt has experienced some weight loss since 2019 but it is not significant for timeframe.  Pt amenable to consuming oral nutrition supplements during admission. RD to order Ensure Enlive TID.  Meal Completion: 50% x 1 meal  Medications reviewed and include: protonix, senna  Labs reviewed: BUN 24, creatinine 1.40 CBG's: 92  NUTRITION - FOCUSED PHYSICAL EXAM:    Most Recent Value  Orbital Region Severe depletion  Upper Arm Region Severe depletion  Thoracic and Lumbar Region Severe depletion  Buccal Region Severe depletion   Temple Region Severe depletion  Clavicle Bone Region Severe depletion  Clavicle and Acromion Bone Region Severe depletion  Scapular Bone Region Severe depletion  Dorsal Hand Severe depletion  Patellar Region Severe depletion  Anterior Thigh Region Severe depletion  Posterior Calf Region Severe depletion  Edema (RD Assessment) None  Hair Reviewed  Eyes Reviewed  Mouth Reviewed  Skin Reviewed  Nails Reviewed       Diet Order:   Diet Order            Diet regular Room service appropriate? Yes; Fluid consistency: Thin  Diet effective now                 EDUCATION NEEDS:   Education needs have been addressed  Skin:  Skin Assessment: Reviewed RN Assessment  Last BM:  no documented BM  Height:   Ht Readings from Last 1 Encounters:  03/08/20 5\' 4"  (1.626 m)    Weight:   Wt Readings from Last 1 Encounters:  03/08/20 42.1 kg    Ideal Body Weight:  54.5 kg  BMI:  Body mass index is 15.93 kg/m.  Estimated Nutritional Needs:   Kcal:  1300-1500  Protein:  65-80 grams  Fluid:  1.3-1.5 L    Gaynell Face, MS, RD, LDN Inpatient Clinical Dietitian Pager: [phone] Weekend/After Hours: [phone]

## 2020-03-08 NOTE — Progress Notes (Signed)
Assisted tele visit to patient with daughter.  Ebonee Stober M, RN

## 2020-03-08 NOTE — Progress Notes (Signed)
Patient left via wheelchair and with transport to go down to [place]. Ticket to ride printed and given to transport.

## 2020-03-09 ENCOUNTER — Inpatient Hospital Stay: Admission: RE | Admit: 2020-03-09 | Payer: Medicare Other | Source: Ambulatory Visit

## 2020-03-09 MED ORDER — PANTOPRAZOLE SODIUM 40 MG PO TBEC
40.0000 mg | DELAYED_RELEASE_TABLET | Freq: Two times a day (BID) | ORAL | Status: DC
  Administered 2020-03-09 – 2020-03-12 (×3): 40 mg via ORAL
  Filled 2020-03-09 (×7): qty 1

## 2020-03-09 NOTE — Progress Notes (Signed)
Jessica [name] is a 86 y.o. female patient is a transfer, awake, alert - oriented  X 4 - no acute distress noted.  VSS - Blood pressure (!) 144/74, pulse 70, temperature 97.9 F (36.6 C), temperature source Oral, resp. rate 17, height 5\' 4"  (1.626 m), weight 42.1 kg, SpO2 91 %.    IV in place, occlusive dsg intact without redness.  Orientation to room, and floor completed with information packet given to patient/family.  Patient declined safety video at this time.  Admission INP armband ID verified with patient/family, and in place.   SR up x 2, fall assessment complete, with patient and able to verbalize understanding of risk associated with falls, patient verbalized understanding to call nsg before up out of bed.  Call light within reach, patient able to voice, and demonstrate understanding.  Skin, clean-dry- intact without evidence of bruising, or skin tears.   No evidence of skin break down noted on exam.     Will cont to eval and treat per MD orders.  Dorris Carnes, RN 03/09/2020 8:03 PM

## 2020-03-09 NOTE — Progress Notes (Signed)
Assisted tele visit to patient with family member.  Gatsby Chismar M, RN  

## 2020-03-09 NOTE — Progress Notes (Signed)
Subjective: Patient reports "I'm doing good, I still have some drooling in my left mouth". She reports no acute events over night and feels unchanged from yesterday.   Objective: Vital signs in last 24 hours: Temp:  [97.9 F (36.6 C)-98.6 F (37 C)] 98 F (36.7 C) (06/11 0700) Pulse Rate:  [39-86] 73 (06/11 0700) Resp:  [14-26] 17 (06/11 0700) BP: (119-176)/(49-110) 125/62 (06/11 0700) SpO2:  [89 %-97 %] 91 % (06/11 0700)  Intake/Output from previous day: 06/10 0701 - 06/11 0700 In: 1823.3 [P.O.:1820; I.V.:3.3] Out: 400 [Urine:400] Intake/Output this shift: No intake/output data recorded.  She is A/O X4, conversant, and in good spirits. Slight cognitive impairment. Decreased sensation to pin prick on left lower face with slight left-sided facial droop. Mild dysarthria and slight expressive aphasia is present. Visual fields grossly normal. Pupils 3, equal, round, reactive to light and accommodation. Hearing normal bilaterally, uvula rises symmetrically, bilateral shoulder shrug present, and tongue protrudes in midline. Mild LUE paresthesia. MAEW with good strength and symmetry. Strength in her BUE and BLE 5/5. No ataxia present.   Lab Results: Recent Labs    03/07/20 1921 03/07/20 1930  WBC 8.3  --   HGB 13.7 14.6  HCT 44.6 43.0  PLT 255  --    BMET Recent Labs    03/07/20 1921 03/07/20 1930  NA 136 143  K 4.0 4.1  CL 100 105  CO2 24  --   GLUCOSE 104* 103*  BUN 22 24*  CREATININE 1.38* 1.40*  CALCIUM 9.6  --     Studies/Results: MR BRAIN WO CONTRAST  Result Date: 03/07/2020 CLINICAL DATA:  86 year old female with left side deficits, suspected stroke. History of colon, ovarian, and melanoma malignancy reported. This is an outpatient who was scanned after hours, office of Dr. Marcial Pacas was contacted but is closed. EXAM: MRI HEAD WITHOUT CONTRAST TECHNIQUE: Multiplanar, multiecho pulse sequences of the brain and surrounding structures were obtained without intravenous  contrast. COMPARISON:  Head CT 11/28/2018.  Brain MRI 05/20/2017. FINDINGS: Brain: Oval intra-axial masslike area of heterogeneous signal measuring about 46 x 35 x 33 mm (AP by transverse by CC) is centered at the right frontal operculum. There are internal areas of mixed hyperintense and hypointense T1/T2 and FLAIR signal. Some of the areas are restricted on diffusion (series 6 image 67) although there is no surrounding regional diffusion restriction. Much of the area is dark on susceptibility weighted imaging consistent with hemorrhage. There is surrounding T2 and FLAIR hyperintensity in a vasogenic edema pattern. There is no intraventricular extension of blood. No definite extra-axial extension of blood. Mass effect on the right lateral ventricle with no significant midline shift at this time (series 11, image 16). Basilar cisterns remain normal. There is no additional area of similar signal abnormality in the brain. And on SWI imaging no additional cerebral blood products are identified. No ventriculomegaly. Patchy, mild to moderate for age other nonspecific bilateral cerebral white matter T2 and FLAIR hyperintensity with no definite cortical encephalomalacia. T2 heterogeneity throughout the deep gray matter nuclei is chronic and more resembles perivascular spaces but a degree of chronic lacunar infarcts are also possible. Unchanged since 2018 small chronic infarct in the left cerebellum (series 10, image 8). Cervicomedullary junction and pituitary are within normal limits. Vascular: Major intracranial vascular flow voids are stable since 2018. Skull and upper cervical spine: Negative for age visible cervical spine. Visualized bone marrow signal is within normal limits. Sinuses/Orbits: Postoperative changes to both globes, otherwise negative orbits.  Paranasal Visualized paranasal sinuses and mastoids are stable and well pneumatized. Other: Visible internal auditory structures appear normal. Scalp and face soft  tissues appear negative. IMPRESSION: 1. Solitary intra-axial hemorrhage versus hemorrhagic mass at the right operculum measuring up to 4.6 cm (volume 27 mL). Regional vasogenic edema and mild mass effect with no midline shift at this time. No extra-axial or intraventricular extension of blood. There is no larger surrounding area of ischemia to strongly suggest a hemorrhagic infarct, and this is indeterminate for a small vessel versus tumor related bleed. Post-contrast MRI images would be valuable, although a repeat MRI without and with contrast once the hematoma has resolved would be most specific. 2. Evidence of chronic small vessel disease otherwise stable since the 2018 MRI. I recommended this patient be seen in an Emergency Department, and her family was agreeable to transport her by private vehicle from [place] to the nearby [facility] ED. In anticipation of the patient's arrival, salient findings were discussed by telephone with the [facility] ED Charge Nurse on 03/07/2020 at 18:33 . Electronically Signed   By: Genevie Ann M.D.   On: 03/07/2020 18:42   MR BRAIN W CONTRAST  Result Date: 03/08/2020 CLINICAL DATA:  Intracranial mass EXAM: MRI HEAD WITH CONTRAST TECHNIQUE: Multiplanar, multiecho pulse sequences of the brain and surrounding structures were obtained with intravenous contrast. CONTRAST:  4.17m GADAVIST GADOBUTROL 1 MMOL/ML IV SOLN COMPARISON:  Brain MRI without contrast 03/07/2020 FINDINGS: Unchanged size of hemorrhage centered at the right frontoparietal junction. There is an area of nodular contrast enhancement within the posterior aspect of the hemorrhage that measures 1.6 x 1.3 cm (series 7, image 37). No other site of abnormal contrast enhancement. The degree of surrounding edema is unchanged. No midline shift. IMPRESSION: 1. Unchanged size of hemorrhage centered at the right frontoparietal junction. There is an area of nodular contrast enhancement within the posterior aspect of the hemorrhage,  likely indicating the presence of tumor. Follow-up study when the hematoma has had time to resolve is suggested. 2. No other contrast-enhancing lesion. Electronically Signed   By: KUlyses JarredM.D.   On: 03/08/2020 03:34    Assessment/Plan: There is a tumor centered at the right frontoparietal junction with surrounding hemorrhage and vasogenic edema that is highly suggestive of metastases from melanoma. Mild mass effect with slight midline shift and effaced lateral ventricle.  Decadron per neurology. Will do metastatic workup.   I met with patient yesterday and talked with her daughter CAltha Harmabout treatment options.  Full treatment would involve preoperative stereotactic radiosurgery followed by craniotomy with tumor resection.  A middle approach would be fractionated radiosurgery without surgery.  The metastatic workup will help [place]ore[place]now extent of disease and this may help u[place]oreawith treatment decisions.  I have discussed patient with Rad Onc and also initiated palliative care evaluation with MWadie Lessen   Awaiting radiologist interpretation of CT abdomen and pelvis. I will talk to the patient today and call her daughter, CAltha Harm to discuss which treatment option they would like to proceed with.   LOS: 2 days    PVerdis Prime6/07/2020, 7:44 AM

## 2020-03-09 NOTE — Progress Notes (Signed)
I spoke with the patient and her daughter, Jessica [name] and notified them of that the results of the metastatic workup came back negative and reviewed potential treatment options. Her daughter will discuss with her brother, Ritchie, and determine how they would like to proceeded. Her tumor is highly suggestive of metastasis from melanoma and will be discussed at the brain tumor conference on Monday morning. I would recomend fractionated radiosurgery without surgery as her best option.    She is ok to discharge back to [facility] and follow up as an outpatient for radiation treatment.

## 2020-03-09 NOTE — Progress Notes (Signed)
STROKE TEAM PROGRESS NOTE   INTERVAL HISTORY Patient is sitting up in bed.  Her blood pressure is adequately controlled.  Neurologically she is unchanged.  CT scan of the chest abdomen and pelvis shows no other evidence of malignancy.  I spoke to her daughter over the phone who informed me that she and her brother have spoken and they want their mother to make a decision whether she wants further evaluation treatment for what appears to be a solitary brain metastasis.  Therapist have evaluated her and recommend skilled nursing facility with 24-hour supervision  Vitals:   03/09/20 0600 03/09/20 0700 03/09/20 0841 03/09/20 0900  BP: (!) 141/79 125/62 131/63 127/64  Pulse: 77 73 81 78  Resp: 19 17 18    Temp:  98 F (36.7 C)    TempSrc:  Oral    SpO2: 93% 91% 97% 100%  Weight:      Height:        CBC:  Recent Labs  Lab 03/07/20 1921 03/07/20 1930  WBC 8.3  --   NEUTROABS 5.8  --   HGB 13.7 14.6  HCT 44.6 43.0  MCV 92.7  --   PLT 255  --     Basic Metabolic Panel:  Recent Labs  Lab 03/07/20 1921 03/07/20 1930  NA 136 143  K 4.0 4.1  CL 100 105  CO2 24  --   GLUCOSE 104* 103*  BUN 22 24*  CREATININE 1.38* 1.40*  CALCIUM 9.6  --     IMAGING past 24 hours CT CHEST W CONTRAST  Result Date: 03/09/2020 CLINICAL DATA:  Hemorrhagic brain mass. Staging. Per previous radiology reports, history of melanoma and carcinoid tumor. EXAM: CT CHEST, ABDOMEN, AND PELVIS WITH CONTRAST TECHNIQUE: Multidetector CT imaging of the chest, abdomen and pelvis was performed following the standard protocol during bolus administration of intravenous contrast. CONTRAST:  25mL OMNIPAQUE IOHEXOL 300 MG/ML  SOLN COMPARISON:  PET-CT 10/31/2010.  Chest CTA 10/10/2011. FINDINGS: CT CHEST FINDINGS Cardiovascular: Extensive atherosclerosis of the aorta, great vessels and coronary arteries. No acute vascular findings are identified. There are calcifications of the aortic valve. The heart size is normal. There is  no pericardial effusion. Mediastinum/Nodes: There are no enlarged mediastinal, hilar or axillary lymph nodes. Small mediastinal and hilar lymph nodes are stable, some calcified. The thyroid gland, esophagus and trachea demonstrate no significant findings. Lungs/Pleura: No pleural effusion or pneumothorax. Moderate centrilobular emphysema with stable biapical scarring and scattered subpleural reticulation. There is a calcified right upper lobe granuloma. The aeration of the lung bases has improved compared with the prior study. No suspicious pulmonary nodules. Musculoskeletal/Chest wall: No chest wall mass or suspicious osseous findings. Inferior endplate compression deformity at T9 is new compared with prior chest CTA, but does not appear acute. CT ABDOMEN AND PELVIS FINDINGS Hepatobiliary: The liver is normal in density without suspicious focal abnormality. No evidence of gallstones, gallbladder wall thickening or biliary dilatation. Pancreas: Unremarkable. No pancreatic ductal dilatation or surrounding inflammatory changes. Spleen: Normal in size without suspicious focal abnormality. Scattered small calcified granulomas are noted. Adrenals/Urinary Tract: Mild thickening of the left adrenal gland appears similar to previous PET-CT. The right adrenal gland appears normal. Bilateral renal vascular calcifications and probable nephrolithiasis. No evidence of ureteral calculus, hydronephrosis or renal mass. The bladder appears normal. Stomach/Bowel: No evidence of bowel wall thickening, distention or surrounding inflammatory change. Moderate stool throughout the colon. No bowel lesions identified. Vascular/Lymphatic: There are no enlarged abdominal or pelvic lymph nodes. There is extensive  aortic and branch vessel atherosclerosis with mild dilatation of the abdominal aorta to 2.3 cm. No focal aneurysm or acute vascular findings. The portal, superior mesenteric and splenic veins are patent. Reproductive: Hysterectomy.   No adnexal mass. Other: No evidence of abdominal wall mass or hernia. No ascites. Musculoskeletal: No acute or significant osseous findings. Interval superior endplate compression deformity at L2 does not appear acute. The T9 and L2 compression deformities are visible on chest radiographs 11/28/2018. IMPRESSION: 1. No evidence of metastatic disease within the chest, abdomen or pelvis. No primary malignancy identified. 2. Sequela of prior granulomatous disease. 3. Chronic T9 and L2 compression deformities. 4. Aortic Atherosclerosis (ICD10-I70.0) and Emphysema (ICD10-J43.9). Electronically Signed   By: Richardean Sale M.D.   On: 03/09/2020 08:06   CT ABDOMEN PELVIS W CONTRAST  Result Date: 03/09/2020 CLINICAL DATA:  Hemorrhagic brain mass. Staging. Per previous radiology reports, history of melanoma and carcinoid tumor. EXAM: CT CHEST, ABDOMEN, AND PELVIS WITH CONTRAST TECHNIQUE: Multidetector CT imaging of the chest, abdomen and pelvis was performed following the standard protocol during bolus administration of intravenous contrast. CONTRAST:  83mL OMNIPAQUE IOHEXOL 300 MG/ML  SOLN COMPARISON:  PET-CT 10/31/2010.  Chest CTA 10/10/2011. FINDINGS: CT CHEST FINDINGS Cardiovascular: Extensive atherosclerosis of the aorta, great vessels and coronary arteries. No acute vascular findings are identified. There are calcifications of the aortic valve. The heart size is normal. There is no pericardial effusion. Mediastinum/Nodes: There are no enlarged mediastinal, hilar or axillary lymph nodes. Small mediastinal and hilar lymph nodes are stable, some calcified. The thyroid gland, esophagus and trachea demonstrate no significant findings. Lungs/Pleura: No pleural effusion or pneumothorax. Moderate centrilobular emphysema with stable biapical scarring and scattered subpleural reticulation. There is a calcified right upper lobe granuloma. The aeration of the lung bases has improved compared with the prior study. No suspicious  pulmonary nodules. Musculoskeletal/Chest wall: No chest wall mass or suspicious osseous findings. Inferior endplate compression deformity at T9 is new compared with prior chest CTA, but does not appear acute. CT ABDOMEN AND PELVIS FINDINGS Hepatobiliary: The liver is normal in density without suspicious focal abnormality. No evidence of gallstones, gallbladder wall thickening or biliary dilatation. Pancreas: Unremarkable. No pancreatic ductal dilatation or surrounding inflammatory changes. Spleen: Normal in size without suspicious focal abnormality. Scattered small calcified granulomas are noted. Adrenals/Urinary Tract: Mild thickening of the left adrenal gland appears similar to previous PET-CT. The right adrenal gland appears normal. Bilateral renal vascular calcifications and probable nephrolithiasis. No evidence of ureteral calculus, hydronephrosis or renal mass. The bladder appears normal. Stomach/Bowel: No evidence of bowel wall thickening, distention or surrounding inflammatory change. Moderate stool throughout the colon. No bowel lesions identified. Vascular/Lymphatic: There are no enlarged abdominal or pelvic lymph nodes. There is extensive aortic and branch vessel atherosclerosis with mild dilatation of the abdominal aorta to 2.3 cm. No focal aneurysm or acute vascular findings. The portal, superior mesenteric and splenic veins are patent. Reproductive: Hysterectomy.  No adnexal mass. Other: No evidence of abdominal wall mass or hernia. No ascites. Musculoskeletal: No acute or significant osseous findings. Interval superior endplate compression deformity at L2 does not appear acute. The T9 and L2 compression deformities are visible on chest radiographs 11/28/2018. IMPRESSION: 1. No evidence of metastatic disease within the chest, abdomen or pelvis. No primary malignancy identified. 2. Sequela of prior granulomatous disease. 3. Chronic T9 and L2 compression deformities. 4. Aortic Atherosclerosis  (ICD10-I70.0) and Emphysema (ICD10-J43.9). Electronically Signed   By: Richardean Sale M.D.   On: 03/09/2020 08:06  PHYSICAL EXAM   Pleasant frail elderly Caucasian lady not in distress. . Afebrile. Head is nontraumatic. Neck is supple without bruit.    Cardiac exam no murmur or gallop. Lungs are clear to auscultation. Distal pulses are well felt. Neurological Exam : Awake alert oriented to time place and person.  Mild dysarthria but no aphasia apraxia.  Follows commands well.  Diminished attention registration and recall.  Extraocular movements full range without nystagmus.  Mild left lower facial weakness.  Tongue midline.  Motor system exam shows no upper or lower extremity drift but mild weakness of left grip and intrinsic hand muscles.  Orbits right over left upper extremity.  Mild subjective diminished sensation in the left hand only.  Gait not tested. ASSESSMENT/PLAN Ms. ARYN SAFRAN is a 86 y.o. female with history of colon cancer, metastatic melanoma, atrial fibrillation not on anticoagulation, hypertension, alcohol abuse, COPD, seizures presenting with 2wk hx of abrupt onset left-sided weakness and numbness. MRI done as an OP shows hemorrhage. Admitted to ICU.    Right frontoparietal brain tumor w/ hemorrhage suspicious for metastasis from melanoma-hemorrhagic infarct from A. fib less likely  MRI w/o solitary intra-axial hemorrhage vs hemorrhagic mas R operculum. Mild mass effect, no shift. Small vessel disease.   MRI w/ unchanged size hemorrhage. Nodular contrast enhancement posterior aspect of hemorrhage likely tumor.   Carotid Doppler  B ICA 1-39% stenosis, VAs antegrade   2D Echo pending  CT chest/abd/pelvis neg malignancy  LDL 121  HgbA1c 5.7  SCDs for VTE prophylaxis  aspirin 81 mg daily prior to admission, now on No antithrombotic given hemorrhage   Therapy recommendations:  SNF  Disposition:  pending - from [facility] ALF level  Neurosurgeon consulting, managing  - now on decadron and protonix  Transfer to floor when bed available  Ok for d/c from stroke standpoint  Atrial Fibrillation  Home anticoagulation:  none , on aspirin 81  Not an AC candidate given hemorrhage   Hypertension  Home meds:  Losartan 100, metoprolol 25  On cleviprex   Increase SBP goal to 160  Add prn labetalol and hydralazine  Wean cardene  Risk of re-hemorrhage low  Hyperlipidemia  Home meds:  No statin  LDL 121  Other Stroke Risk Factors  Advanced age  Cigarette smoker, advised to stop smoking  Hx ETOH use, alcohol level <5, advised to drink no more than 1 drink(s) a day  PVD  Other Active Problems  Hx colon cancer   Hx metastatic melanoma   Hx seizures on lamictal, only name brand works per pt  Anxiety  AKI +/- CKD Cre 1.4  Hospital day # 2 Patient appears neurologically stable.  Continue Decadron for cytotoxic edema and Protonix for stomach protection.  Mobilize out of bed.  Transfer to neurology floor bed.  Patient wants to pursue treatment for her brain lesion and we discussed with [name] neurosurgery plan for biopsy  or excision followed by radiation which would be appropriate.  Discussed with patient's daughter over the phone and answered questions.  Greater than 50% time during the 35-minute visit was spent in counseling and coordination of care and discussion with care team about her hemorrhagic brain lesion its treatment and evaluation and answering questions   Antony Contras, MD  To contact Stroke Continuity provider, please refer to [email or web address] After hours, contact General Neurology

## 2020-03-10 NOTE — Progress Notes (Signed)
STROKE TEAM PROGRESS NOTE   INTERVAL HISTORY:  She is laying in bed, NAD, oriented to self and month/year, no changes overnight, feels the same today. Per nurse emotionally labile, per report is ambulating, getting daily meds orally swallowing well without cough, still complaining of weakness when holding things with left hand and left hand numbness.   Neurologically she is unchanged.  CT scan of the chest abdomen and pelvis shows no other evidence of malignancy.  Dr. Leonie Man spoke to her daughter over the phone who informed him that she and her brother have spoken and they want their mother to make a decision whether she wants further evaluation treatment for what appears to be a solitary brain metastasis. Patient has expressed wishes to treat and Neurosurgery on board.  Therapist have evaluated her and recommend skilled nursing facility with 24-hour supervision  Vitals:   03/10/20 0841 03/10/20 0858 03/10/20 0900 03/10/20 1238  BP: (!) 161/75 (!) 160/83 (!) 119/55 110/61  Pulse: 70  73 74  Resp: 16   18  Temp: 98 F (36.7 C)   97.9 F (36.6 C)  TempSrc: Oral   Oral  SpO2: 93%   96%  Weight:      Height:        CBC:  Recent Labs  Lab 03/07/20 1921 03/07/20 1930  WBC 8.3  --   NEUTROABS 5.8  --   HGB 13.7 14.6  HCT 44.6 43.0  MCV 92.7  --   PLT 255  --     Basic Metabolic Panel:  Recent Labs  Lab 03/07/20 1921 03/07/20 1930  NA 136 143  K 4.0 4.1  CL 100 105  CO2 24  --   GLUCOSE 104* 103*  BUN 22 24*  CREATININE 1.38* 1.40*  CALCIUM 9.6  --     IMAGING past 24 hours No results found.  PHYSICAL EXAM  Frail elderly Caucasian lady not in distress. Afebrile. Head is nontraumatic. Neck is supple without bruit.    Cardiac exam no murmur or gallop. Lungs are clear to auscultation. Distal pulses are well felt. Neurological Exam : Awake alert oriented to time place and person, month and year.  Mild dysarthria but no aphasia.  Follows commands easily.   Extraocular  movements full range without nystagmus.  Mild left lower facial weakness.  Tongue midline.  Motor system exam shows no upper or lower extremity drift but mild weakness of left grip and intrinsic hand muscles. She lifts her arms overhead and both legs without problem or drift on verbal commands.  Mild subjective diminished sensation in the left hand only.  Gait not tested.  ASSESSMENT/PLAN Ms. KEYIRA MONDESIR is a 86 y.o. female with history of colon cancer, metastatic melanoma, atrial fibrillation not on anticoagulation, hypertension, alcohol abuse, COPD, seizures presenting with 2wk hx of abrupt onset left-sided weakness and numbness. MRI done as an OP shows hemorrhage. Admitted to ICU.    Right frontoparietal brain tumor w/ hemorrhage suspicious for metastasis from melanoma-hemorrhagic infarct from A. fib less likely  MRI w/o solitary intra-axial hemorrhage vs hemorrhagic mas R operculum. Mild mass effect, no shift. Small vessel disease.   MRI w/ unchanged size hemorrhage. Nodular contrast enhancement posterior aspect of hemorrhage likely tumor.   Carotid Doppler  B ICA 1-39% stenosis, VAs antegrade   2D Echo pending  CT chest/abd/pelvis neg malignancy  LDL 121  HgbA1c 5.7  SCDs for VTE prophylaxis  aspirin 81 mg daily prior to admission, now on No antithrombotic given  hemorrhage   Therapy recommendations:  SNF  Disposition:  pending - from [facility] ALF level  Neurosurgeon consulting, managing - now on decadron and protonix  Ok for d/c from stroke standpoint  Atrial Fibrillation  Home anticoagulation:  none , on aspirin 81  Not an AC candidate given hemorrhage   Hypertension  Home meds:  Losartan 100, metoprolol 25  On cleviprex   Increase SBP goal to 160  Add prn labetalol and hydralazine  Weaned cardene  Risk of re-hemorrhage low  Hyperlipidemia  Home meds:  No statin  LDL 121  Other Stroke Risk Factors  Advanced age  Cigarette smoker, advised to  stop smoking  Hx ETOH use, alcohol level <5, advised to drink no more than 1 drink(s) a day  PVD  Other Active Problems  Hx colon cancer   Hx metastatic melanoma   Hx seizures on lamictal, only name brand works per pt  Anxiety  AKI +/- CKD Cre 1.4 - recheck in AM  Code status - DNR  Hospital day # 3    Patient appears neurologically stable.  Continue Decadron for cytotoxic edema and Protonix for stomach protection.  Mobilize out of bed.    Patient wants to pursue treatment for her brain lesion and discussed with [name] neurosurgery plan for biopsy  or excision followed by radiation which would be appropriate.  Dr. Leonie Man also with patient's daughter over the phone and answered questions.   Personally examined patient and images, and have participated in and made any corrections needed to history, physical, neuro exam,assessment and plan as stated above.  I have personally obtained the history, evaluated lab date, reviewed imaging studies and agree with radiology interpretations.    Sarina Ill, MD Stroke Neurology  I spent 25 minutes of face-to-face and non-face-to-face time with patient. This included prechart review, lab review, study review, order entry, electronic health record documentation, patient education on the different diagnostic and therapeutic options, counseling and coordination of care, risks and benefits of management, compliance, or risk factor reduction    To contact Stroke Continuity provider, please refer to [email or web address] After hours, contact General Neurology

## 2020-03-10 NOTE — TOC Initial Note (Addendum)
Transition of Care [facility]) - Initial/Assessment Note    Patient Details  Name: Jessica [name] MRN: [id number] Date of Birth: Dec 09, 1931  [facility] CM/SW Contact:    Trula Ore, House Phone Number: 03/10/2020, 9:56 AM  Clinical Narrative:                  CSW spoke with patients daughter [name] and confirmed plan for patient at time of discharge. The plan is for patient to go back to [place]. Patients daughter is agreeable to short term rehab at ALF.Will need to request another covid tomorrow 6/13.  TOC team will continue to follow.  Expected Discharge Plan: Assisted Living Barriers to Discharge: Continued Medical Work up   Patient Goals and CMS Choice Patient states their goals for this hospitalization and ongoing recovery are:: to go back to ALF CMS Medicare.gov Compare Post Acute Care list provided to:: Patient Represenative (must comment) (Daughter [name]) Choice offered to / list presented to : Adult Children Nurse, mental health)  Expected Discharge Plan and Services Expected Discharge Plan: Assisted Living       Living arrangements for the past 2 months: Assisted Living Facility                                      Prior Living Arrangements/Services Living arrangements for the past 2 months: [place] Lives with:: Self, Facility Resident Patient language and need for interpreter reviewed:: Yes Do you feel safe going back to the place where you live?: Yes      Need for Family Participation in Patient Care: Yes (Comment) Care giver support system in place?: Yes (comment)   Criminal Activity/Legal Involvement Pertinent to Current Situation/Hospitalization: No - Comment as needed  Activities of Daily Living Home Assistive Devices/Equipment: None, Dentures (specify type), Contact lenses, Other (Comment) ADL Screening (condition at time of admission) Patient's cognitive ability adequate to  safely complete daily activities?: Yes Is the patient deaf or have difficulty hearing?: No Does the patient have difficulty seeing, even when wearing glasses/contacts?: No Does the patient have difficulty concentrating, remembering, or making decisions?: No Patient able to express need for assistance with ADLs?: Yes Does the patient have difficulty dressing or bathing?: No Independently performs ADLs?: Yes (appropriate for developmental age) Does the patient have difficulty walking or climbing stairs?: Yes Weakness of Legs: None Weakness of Arms/Hands: Left  Permission Sought/Granted Permission sought to share information with : Case Manager, Family Supports, Customer service manager                Emotional Assessment       Orientation: : Oriented to Self, Oriented to Place, Oriented to  Time, Oriented to Situation Alcohol / Substance Use: Not Applicable Psych Involvement: No (comment)  Admission diagnosis:  ICH (intracerebral hemorrhage) ([place]) [I61.9] Patient Active Problem List   Diagnosis Date Noted  . ICH (intracerebral hemorrhage) ([place]) 03/07/2020  . Left sided numbness 02/28/2020  . Left-sided weakness 02/28/2020  . Stroke-like symptom 02/22/2020  . Hypomagnesemia 12/29/2018  . Hypokalemia 12/09/2018  . BPPV (benign paroxysmal positional vertigo) 12/09/2018  . Compression fracture of T9 vertebra ([place]) 09/02/2018  . CKD (chronic kidney disease) stage 3, GFR 30-59 ml/min 02/23/2018  . Need for pneumococcal vaccine 02/23/2018  . Prediabetes 02/23/2018  . Protein-calorie malnutrition, severe ([place]) 01/04/2018  . Hyperglycemia 10/06/2017  . Episode of recurrent major depressive disorder (  [place]) 10/06/2017  . Nonintractable epilepsy without status epilepticus ([place]) 07/14/2017  . Anemia 05/28/2017  . Loss of weight 01/18/2015  . Osteoarthritis of finger 02/16/2014  . Peripheral vascular disease ([place])   . Depression with anxiety   . Hyperlipidemia LDL goal <130    . Cancer ([place])   . Confusion   . Alcohol abuse   . Tachycardia   . Generalized convulsive epilepsy ([place])   . GERD (gastroesophageal reflux disease) 02/03/2013  . Personal history of colon cancer 10/03/2011  . Bronchitis 09/26/2011  . COPD (chronic obstructive pulmonary disease) ([place]) 09/26/2011  . Hypertension 09/26/2011  . Hyperlipemia 09/26/2011  . Tobacco abuse 09/26/2011  . Diarrhea 09/26/2011   PCP:  Virgie Dad, MD Pharmacy:   [facility] [address] [place] - [address] [place] [address] Phone: [phone] Fax: [phone]     Social Determinants of Health (SDOH) Interventions    Readmission Risk Interventions No flowsheet data found.

## 2020-03-10 NOTE — NC FL2 (Addendum)
[place] LEVEL OF CARE SCREENING TOOL     IDENTIFICATION  Patient Name: Jessica [name] Birthdate: May 25, 1932 Sex: female Admission Date (Current Location): 03/07/2020  [facility] and [place] Number:  Herbalist and Address:  [facility], [place] [address] [place], [place] [address]      Provider Number: [id number]  Attending Physician Name and Address:  Garvin Fila, MD  Relative Name and Phone Number:       Current Level of Care: Hospital Recommended Level of Care: [place] Prior Approval Number:    Date Approved/Denied: 11/11/10 PASRR Number: [id number]  Discharge Plan: SNF    Current Diagnoses: Patient Active Problem List   Diagnosis Date Noted   ICH (intracerebral hemorrhage) ([place]) 03/07/2020   Left sided numbness 02/28/2020   Left-sided weakness 02/28/2020   Stroke-like symptom 02/22/2020   Hypomagnesemia 12/29/2018   Hypokalemia 12/09/2018   BPPV (benign paroxysmal positional vertigo) 12/09/2018   Compression fracture of T9 vertebra ([place]) 09/02/2018   CKD (chronic kidney disease) stage 3, GFR 30-59 ml/min 02/23/2018   Need for pneumococcal vaccine 02/23/2018   Prediabetes 02/23/2018   Protein-calorie malnutrition, severe ([place]) 01/04/2018   Hyperglycemia 10/06/2017   Episode of recurrent major depressive disorder ([place]) 10/06/2017   Nonintractable epilepsy without status epilepticus ([place]) 07/14/2017   Anemia 05/28/2017   Loss of weight 01/18/2015   Osteoarthritis of finger 02/16/2014   Peripheral vascular disease ([place])    Depression with anxiety    Hyperlipidemia LDL goal <130    Cancer (HCC)    Confusion    Alcohol abuse    Tachycardia    Generalized convulsive epilepsy ([place])    GERD (gastroesophageal reflux disease) 02/03/2013   Personal history of colon cancer 10/03/2011   Bronchitis 09/26/2011   COPD (chronic obstructive pulmonary disease) ([place]) 09/26/2011   Hypertension  09/26/2011   Hyperlipemia 09/26/2011   Tobacco abuse 09/26/2011   Diarrhea 09/26/2011    Orientation RESPIRATION BLADDER Height & Weight     Self, Time, Situation, Place  Normal Continent Weight: 92 lb 13 oz (42.1 kg) Height:  5\' 4"  (162.6 cm)  BEHAVIORAL SYMPTOMS/MOOD NEUROLOGICAL BOWEL NUTRITION STATUS      Continent Diet (see discharge summary)  AMBULATORY STATUS COMMUNICATION OF NEEDS Skin   Limited Assist   Skin abrasions (blisters (dry healed skin graft sites) on her feet)                       Personal Care Assistance Level of Assistance  Bathing, Feeding, Dressing Bathing Assistance: Limited assistance Feeding assistance: Independent Dressing Assistance: Limited assistance     Functional Limitations Info  Hearing, Sight, Speech Sight Info: Adequate Hearing Info: Adequate Speech Info: Adequate    SPECIAL CARE FACTORS FREQUENCY  PT (By licensed PT), OT (By licensed OT)     PT Frequency: 5x week OT Frequency: 5x week            Contractures Contractures Info: Not present    Additional Factors Info  Code Status, Allergies Code Status Info: DNR Allergies Info: Aspirin, Effexor (Venlafaxine Hcl), Keflex (Cephalexin), Levaquin (Levofloxacin), Venlafaxine, Penicillins, Zithromax (Azithromycin)           Current Medications (03/10/2020):  This is the current hospital active medication list Current Facility-Administered Medications  Medication Dose Route Frequency Provider Last Rate Last Admin   acetaminophen (TYLENOL) tablet 650 mg  650 mg Oral Q4H PRN Donzetta Starch, NP  Or   acetaminophen (TYLENOL) 160 MG/5ML solution 650 mg  650 mg Per Tube Q4H PRN Donzetta Starch, NP       Or   acetaminophen (TYLENOL) suppository 650 mg  650 mg Rectal Q4H PRN Donzetta Starch, NP       calcium-vitamin D (OSCAL WITH D) 500-200 MG-UNIT per tablet 1 tablet  1 tablet Oral Daily Donzetta Starch, NP   1 tablet at 03/09/20 0840   Chlorhexidine Gluconate Cloth 2 % PADS 6  each  6 each Topical Daily Donzetta Starch, NP   6 each at 03/09/20 0842   dexamethasone (DECADRON) injection 4 mg  4 mg Intravenous Q6H Burnetta Sabin L, NP   4 mg at 03/10/20 9449   feeding supplement (ENSURE ENLIVE) (ENSURE ENLIVE) liquid 237 mL  237 mL Oral TID BM Donzetta Starch, NP   237 mL at 03/09/20 2008   hydrALAZINE (APRESOLINE) injection 20 mg  20 mg Intravenous Q6H PRN Burnetta Sabin L, NP       ipratropium (ATROVENT) nebulizer solution 0.6 mg  3 mL Nebulization Q6H PRN Donzetta Starch, NP       labetalol (NORMODYNE) injection 20 mg  20 mg Intravenous Q2H PRN Burnetta Sabin L, NP   20 mg at 03/10/20 0900   lamoTRIgine (LAMICTAL) tablet 150 mg  150 mg Oral Daily Burnetta Sabin L, NP   150 mg at 03/09/20 0840   lamoTRIgine (LAMICTAL) tablet 200 mg  200 mg Oral QHS Burnetta Sabin L, NP   200 mg at 03/09/20 2138   losartan (COZAAR) tablet 100 mg  100 mg Oral Daily Burnetta Sabin L, NP   100 mg at 03/09/20 0840   metoprolol succinate (TOPROL-XL) 24 hr tablet 50 mg  50 mg Oral Daily Burnetta Sabin L, NP   50 mg at 03/09/20 0841   pantoprazole (PROTONIX) EC tablet 40 mg  40 mg Oral BID Donzetta Starch, NP   40 mg at 03/09/20 2138   senna-docusate (Senokot-S) tablet 1 tablet  1 tablet Oral BID Donzetta Starch, NP   1 tablet at 03/09/20 0841   zonisamide (ZONEGRAN) capsule 100 mg  100 mg Oral q AM Donzetta Starch, NP   100 mg at 03/10/20 0603   zonisamide (ZONEGRAN) capsule 200 mg  200 mg Oral QPM Donzetta Starch, NP   200 mg at 03/09/20 1853     Discharge Medications: Please see discharge summary for a list of discharge medications.  Relevant Imaging Results:  Relevant Lab Results:   Additional Information [id number]  Alexander Mt, LCSW  I have personally obtained history,examined this patient, reviewed notes, independently viewed imaging studies, participated in medical decision making and plan of care.ROS completed by me personally and pertinent positives fully documented  I have made any  additions or clarifications directly to the above note. Agree with note above. Antony Contras, MD Medical Director [facility] Pager: [phone] 03/12/2020 6:36 AM

## 2020-03-10 NOTE — Progress Notes (Signed)
Pt requested Ipad and charger be collected from security so she could use them. The RN went to security and retrieved ipad and charger and delivered them back to patients daughter who was visiting at the bedside.

## 2020-03-10 NOTE — Progress Notes (Signed)
Patient ID: Jessica [name], female   DOB: [birth year], 86 y.o.   MRN: [id number] Blood pressure (!) 119/55, pulse 73, temperature 98 F (36.7 C), temperature source Oral, resp. rate 16, height 5\' 4"  (1.626 m), weight 42.1 kg, SpO2 93 %. alert and oriented x4 Speech is clear Moving all extremities well Awaiting treatment plan, stable for now

## 2020-03-11 DIAGNOSIS — Z87898 Personal history of other specified conditions: Secondary | ICD-10-CM

## 2020-03-11 DIAGNOSIS — I160 Hypertensive urgency: Secondary | ICD-10-CM

## 2020-03-11 LAB — BASIC METABOLIC PANEL
Anion gap: 6 (ref 5–15)
BUN: 27 mg/dL — ABNORMAL HIGH (ref 8–23)
CO2: 28 mmol/L (ref 22–32)
Calcium: 9.3 mg/dL (ref 8.9–10.3)
Chloride: 107 mmol/L (ref 98–111)
Creatinine, Ser: 1.22 mg/dL — ABNORMAL HIGH (ref 0.44–1.00)
GFR calc Af Amer: 47 mL/min — ABNORMAL LOW (ref >60)
GFR calc non Af Amer: 40 mL/min — ABNORMAL LOW (ref >60)
Glucose, Bld: 157 mg/dL — ABNORMAL HIGH (ref 70–99)
Potassium: 4 mmol/L (ref 3.5–5.1)
Sodium: 141 mmol/L (ref 135–145)

## 2020-03-11 MED ORDER — AMLODIPINE BESYLATE 10 MG PO TABS
10.0000 mg | ORAL_TABLET | Freq: Every day | ORAL | Status: DC
  Administered 2020-03-11 – 2020-03-13 (×3): 10 mg via ORAL
  Filled 2020-03-11 (×3): qty 1

## 2020-03-11 NOTE — Progress Notes (Signed)
Subjective: Patient reports no headaches  Objective: Vital signs in last 24 hours: Temp:  [97.8 F (36.6 C)-98.5 F (36.9 C)] 97.8 F (36.6 C) (06/13 0303) Pulse Rate:  [66-81] 66 (06/13 1302) Resp:  [16-18] 16 (06/13 1302) BP: (133-173)/(56-107) 166/107 (06/13 1302) SpO2:  [90 %-97 %] 97 % (06/13 1302)  Intake/Output from previous day: No intake/output data recorded. Intake/Output this shift: No intake/output data recorded.  NAD. Frail appearing L facial droop, hand weakness, mild dysarthria  Lab Results: No results for input(s): WBC, HGB, HCT, PLT in the last 72 hours. BMET Recent Labs    03/11/20 0330  NA 141  K 4.0  CL 107  CO2 28  GLUCOSE 157*  BUN 27*  CREATININE 1.22*  CALCIUM 9.3    Studies/Results: No results found.  Assessment/Plan: Hemorrhagic met - likely SRS, will discuss at tumor [place] 03/11/2020, 2:14 PM

## 2020-03-11 NOTE — Progress Notes (Addendum)
STROKE TEAM PROGRESS NOTE   INTERVAL HISTORY: Son at bedside.  Patient sitting in bed, no complaints.  She stated that she felt bad this morning but now much better, no headache.  Neurosurgery team Dr. Marcello Moores saw patient this morning and will discuss patient plan in tumor board tomorrow.  Vitals:   03/10/20 1949 03/10/20 2312 03/11/20 0303 03/11/20 0340  BP: (!) 142/67 (!) 155/74 (!) 173/69 (!) 133/56  Pulse: 72 70 66 68  Resp: 17 17 17    Temp: 98.5 F (36.9 C) 98 F (36.7 C) 97.8 F (36.6 C)   TempSrc: Oral Oral Oral   SpO2: 92% 92% 96%   Weight:      Height:        CBC:  Recent Labs  Lab 03/07/20 1921 03/07/20 1930  WBC 8.3  --   NEUTROABS 5.8  --   HGB 13.7 14.6  HCT 44.6 43.0  MCV 92.7  --   PLT 255  --     Basic Metabolic Panel:  Recent Labs  Lab 03/07/20 1921 03/07/20 1921 03/07/20 1930 03/11/20 0330  NA 136   < > 143 141  K 4.0   < > 4.1 4.0  CL 100   < > 105 107  CO2 24  --   --  28  GLUCOSE 104*   < > 103* 157*  BUN 22   < > 24* 27*  CREATININE 1.38*   < > 1.40* 1.22*  CALCIUM 9.6  --   --  9.3   < > = values in this interval not displayed.    IMAGING past 24 hours No results found.  PHYSICAL EXAM  Temp:  [97.8 F (36.6 C)-98.5 F (36.9 C)] 97.8 F (36.6 C) (06/13 0303) Pulse Rate:  [66-72] 66 (06/13 1302) Resp:  [16-17] 16 (06/13 1302) BP: (133-173)/(56-107) 166/107 (06/13 1302) SpO2:  [92 %-97 %] 97 % (06/13 1302)  General - cachectic, well developed, in no apparent distress.  Ophthalmologic - fundi not visualized due to noncooperation.  Cardiovascular - Regular rhythm and rate.  Mental Status -  Level of arousal and orientation to time, place, and person were intact. Language including expression, naming, repetition, comprehension was assessed and found intact.  Cranial Nerves II - XII - II - Visual field intact OU. III, IV, VI - Extraocular movements intact. V - Facial sensation decreased on the left. VII - mild right  nasolabial fold flattening. VIII - Hearing & vestibular intact bilaterally. X - Palate elevates symmetrically. XI - Chin turning & shoulder shrug intact bilaterally. XII - Tongue protrusion intact.  Motor Strength - The patient's strength was normal in all extremities and pronator drift was absent.  Bulk was normal and fasciculations were absent.   Motor Tone - Muscle tone was assessed at the neck and appendages and was normal.  Reflexes - The patient's reflexes were symmetrical in all extremities and she had no pathological reflexes.  Sensory - Light touch, temperature/pinprick were assessed and were decreased on the left, about 60% of the right.    Coordination - The patient had normal movements in the hands grossly intact with no ataxia or dysmetria.  Tremor was absent.  Gait and Station - deferred.   ASSESSMENT/PLAN Jessica [name] is a 86 y.o. female with history of colon cancer, metastatic melanoma, atrial fibrillation not on anticoagulation, hypertension, alcohol abuse, COPD, seizures presenting with 2wk hx of abrupt onset left-sided weakness and numbness. MRI done as an OP shows ICH.  Admitted to ICU.    ICH - Right frontoparietal ICH - concerning for metastasis melanoma - hemorrhagic infarct from A. fib less likely  MRI w/o solitary intra-axial hemorrhage vs hemorrhagic mas R operculum. Mild mass effect, no shift. Small vessel disease.   MRI w/ unchanged size hemorrhage. Nodular contrast enhancement posterior aspect of hemorrhage likely tumor.   Carotid Doppler  B ICA 1-39% stenosis, VAs antegrade   2D Echo pending  CT chest/abd/pelvis neg for malignancy  LDL 121  HgbA1c 5.7  SCDs for VTE prophylaxis  aspirin 81 mg daily prior to admission, now on No antithrombotic given hemorrhage   Therapy recommendations:  SNF  Disposition:  pending - from [facility] ALF level  Brain metastatic tumor  Likely met frmo melanoma  Neurosurgeon on board   [name] plan for  biopsy or excision followed by radiation, but pending tumor board discussion  now on decadron  Atrial Fibrillation  Home anticoagulation:  none , on aspirin 81 PTA  Not an AC candidate given ICH  Currently sinus rhythm  Rate controlled   Hypertension  Home meds:  Losartan 100, metoprolol 25  Off cleviprex now  SBP goal < 160  On prn labetalol and hydralazine  On amlodipine 10 and losartan 100 as well as toprol 50  Hyperlipidemia  Home meds:  No statin  LDL 121  Hold off statin for now due to [place]  Tobacco abuse  Current smoker  Smoking cessation counseling provided  Pt is willing to quit  Other Stroke Risk Factors  Advanced age  Hx ETOH use, alcohol level <5, advised to drink no more than 1 drink(s) a day  PVD  Other Active Problems  Hx of colon cancer   Hx of metastatic melanoma   Hx of seizures on lamictal and zonagran, only name brand works per pt - follow up with Dr. Krista Blue at [facility] - has appointment with NP Olegario Messier on 04/17/20 at [place]  AKI on CKD IIIA, Cre 1.4 -> 1.22   Code status - DNR  Hospital day # 4   [name], MD PhD Stroke Neurology 03/11/2020 4:17 PM   To contact Stroke Continuity provider, please refer to [email or web address] After hours, contact General Neurology

## 2020-03-12 ENCOUNTER — Inpatient Hospital Stay: Payer: Medicare Other | Attending: Radiation Oncology

## 2020-03-12 ENCOUNTER — Other Ambulatory Visit (HOSPITAL_COMMUNITY): Payer: Medicare Other

## 2020-03-12 DIAGNOSIS — R531 Weakness: Secondary | ICD-10-CM

## 2020-03-12 DIAGNOSIS — C719 Malignant neoplasm of brain, unspecified: Secondary | ICD-10-CM

## 2020-03-12 DIAGNOSIS — Z66 Do not resuscitate: Secondary | ICD-10-CM

## 2020-03-12 DIAGNOSIS — Z515 Encounter for palliative care: Secondary | ICD-10-CM

## 2020-03-12 DIAGNOSIS — C711 Malignant neoplasm of frontal lobe: Secondary | ICD-10-CM

## 2020-03-12 NOTE — Consult Note (Addendum)
Consultation Note Date: 03/12/2020   Patient Name: Jessica [name]  DOB: 1931-10-27  MRN: [id number]  Age / Sex: 86 y.o., female  PCP: Virgie Dad, MD Referring Physician: Barb Merino, MD  Reason for Consultation: Establishing goals of care and Psychosocial/spiritual support  HPI/Patient Profile: 86 y.o. female  admitted on 03/07/2020 with history of colon cancer, metastatic melanoma, atrial fibrillation not on anticoagulation, hypertension, COPD, seizures on antiepileptics admitted to neuro ICU with abrupt onset of left-sided weakness and numbness, retrospectively ongoing for about 2 weeks.    In the emergency room she had left upper extremity drift.  MRI of the brain revealed moderate sized right frontoparietal hemorrhage consistent with metasatic tumor possible 2/2 to metastatic melanoma.  Patient reported symptoms for 2 weeks.  Neurosurgery recommended stereotactic radiosurgery/3 fractions.  Pre-radiation planning/ MRI/simulation could be done inpatient with 3 fractions completed as outpatient.  Today she reports a continued physical and functional decline over the past many months,  a  poor appetite and significant unintended weight loss.  Patient faces treatment option decisions, advanced directive decisions and anticipatory care needs.  Clinical Assessment and Goals of Care:  This NP Wadie Lessen reviewed medical records, received report from team, assessed the patient and then meet at the patient's bedside  to discuss diagnosis, prognosis, GOC, EOL wishes disposition and options.   Concept of Palliative Care was introduced as specialized medical care for people and their families living with serious illness.  If focuses on providing relief from the symptoms and stress of a serious illness.  The goal is to improve quality of life for both the patient and the family.  Created space and  opportunity for patient to explore her thoughts and feelings regarding her current medical situation.  She reports that she has had continued physical and functional decline and significant weight loss over the past many months.  A  discussion was had today regarding advanced directives.  Concepts specific to code status, artifical feeding and hydration, continued IV antibiotics and rehospitalization was had.  The difference between a aggressive medical intervention path  and a palliative comfort care path for this patient at this time was had.  Values and goals of care important to patient and family were attempted to be elicited.  Jessica [name] is alert and oriented.  She is pleasant and engages freely with today's conversation.  She verbalizes frustration in "sometimes unable to find the right word, and now this drooling"    She verbalizes an understanding that "time is getting limited" "I am a loner now".  She verbalizes a sense of peace and just "wants to be comfortable and left alone".    Education offered on the difference between an aggressive medical intervention path and a palliative comfort path for this patient at this time in this situation.  We discussed the logistics of stereotactic radiosurgery in 3 fractions as discussed with Dr. Francis Dowse. Education offered regarding MRI and simulation.  Patient adamantly verbalizes "I do not want any of that"   Natural trajectory  and expectations at EOL were discussed.  Questions and concerns addressed.  Patient  encouraged to call with questions or concerns.     PMT will continue to support holistically.     I spoke by phone with both her son and daughter and they support the patient's decisions.  They hope for comfort and dignity for their mother       PATIENT has medical capacity and is making her own decisions.  Both her son and daughter support the patient's decisions.    SUMMARY OF RECOMMENDATIONS    Code Status/Advance  Care Planning:  DNR   No further diagnostics or treatment for intracranial mass.  Discharge to SNF/[facility] with hospice services in place  Focus of care is comfort, quality and dignity   Palliative Prophylaxis:   Aspiration, Bowel Regimen, Delirium Protocol, Frequent Pain Assessment and Oral Care  Additional Recommendations (Limitations, Scope, Preferences):  Full Comfort Care  Psycho-social/Spiritual:   Desire for further Chaplaincy support:no  Additional Recommendations: Education on Hospice  Prognosis:   < 6 months  Discharge Planning:  Family was inquiring about residential inpatient hospice options.  At this time patient's prognosis is likely months, making eligibility at inpatient hospice  unlikely.  Education offered to both patient and her family regarding hospice benefit; residential vs home vs SNF.  Both patient and family tell me that plan is for skilled nursing facility at [place] with Hospice      Primary Diagnoses: Present on Admission:  ICH (intracerebral hemorrhage) ([place])  Malignant brain tumor ([place])   I have reviewed the medical record, interviewed the patient and family, and examined the patient. The following aspects are pertinent.  Past Medical History:  Diagnosis Date   Alcohol abuse 11/11/2010   Alcohol abuse, unspecified    Anxiety    Atrial fibrillation ([place]) 11/11/2010   Bone infection of left hand ([place]) 12/22/2008   secondary to injury   Cancer [facility])    colon cancer-hx of chemo   Chronic airway obstruction, not elsewhere classified 10/01/2011   Confusion    COPD (chronic obstructive pulmonary disease) ([facility])    Dizziness and giddiness 04/24/2005   Edema 12/03/2011   Embolism - blood clot 1996   left breast   Generalized convulsive epilepsy without mention of intractable epilepsy    GERD (gastroesophageal reflux disease)    Hemorrhoids    Hyperlipidemia    Hypertension     Insomnia, unspecified 11/11/2010   Major depressive disorder, single episode, unspecified 11/11/2010   Malignant neoplasm of stomach, unspecified site 11/11/2010   Melanoma ([place])    metastatic   Peripheral vascular disease ([place])    Seizures ([place])    Symptomatic menopausal or female climacteric states 11/11/2010   Tachycardia, unspecified    Tobacco use disorder    Urgency of urination    Social History   Socioeconomic History   Marital status: Widowed    Spouse name: Gwyndolyn Saxon   Number of children: 3   Years of education: college   Highest education level: Not on file  Occupational History   Occupation: Arboriculturist: RETIRED    Comment: retired  Tobacco Use   Smoking status: Current Every Day Smoker    Years: 60.00    Types: Cigarettes   Smokeless tobacco: Never Used   Tobacco comment: 1 pack last 3 days-10/29/17  Vaping Use   Vaping Use: Some days  Substance and Sexual Activity   Alcohol use:  No    Comment: quit 02-02-2010   Drug use: No   Sexual activity: Never  Other Topics Concern   Not on file  Social History Narrative   Patient is retired. Patient lives at [facility].    Education- college education.   Right handed.   Caffeine- some. Two cups daily.   Widowed    Exercise no   Alcohol none   Smokes about half pack daily    Social Determinants of Health   Financial Resource Strain:    Difficulty of Paying Living Expenses:   Food Insecurity:    Worried About Charity fundraiser in the Last Year:    Arboriculturist in the Last Year:   Transportation Needs:    Film/video editor (Medical):    Lack of Transportation (Non-Medical):   Physical Activity:    Days of Exercise per Week:    Minutes of Exercise per Session:   Stress:    Feeling of Stress :   Social Connections:    Frequency of Communication with Friends and Family:    Frequency of Social Gatherings with Friends and Family:     Attends Religious Services:    Active Member of Clubs or Organizations:    Attends Music therapist:    Marital Status:    Family History  Problem Relation Age of Onset   Cancer Mother    Breast cancer Sister    Cancer Brother    Scheduled Meds:  amLODipine  10 mg Oral Daily   calcium-vitamin D  1 tablet Oral Daily   Chlorhexidine Gluconate Cloth  6 each Topical Daily   dexamethasone (DECADRON) injection  4 mg Intravenous Q6H   feeding supplement (ENSURE ENLIVE)  237 mL Oral TID BM   lamoTRIgine  150 mg Oral Daily   lamoTRIgine  200 mg Oral QHS   losartan  100 mg Oral Daily   metoprolol succinate  50 mg Oral Daily   pantoprazole  40 mg Oral BID   senna-docusate  1 tablet Oral BID   zonisamide  100 mg Oral q AM   zonisamide  200 mg Oral QPM   Continuous Infusions: PRN Meds:.acetaminophen **OR** acetaminophen (TYLENOL) oral liquid 160 mg/5 mL **OR** acetaminophen, hydrALAZINE, ipratropium, labetalol Medications Prior to Admission:  Prior to Admission medications   Medication Sig Start Date End Date Taking? Authorizing Provider  aspirin EC 81 MG tablet Take 81 mg by mouth daily. Enteric coated tablet after meal to prevent nausea, vomiting or GI upset.   Yes [provider]  Calcium Carb-Cholecalciferol (CALCIUM 600/VITAMIN D3) 600-800 MG-UNIT TABS Take 1 tablet by mouth daily.   Yes [provider]  esomeprazole (NEXIUM) 40 MG capsule Take 40 mg by mouth daily at 12 noon.   Yes [provider]  ipratropium (ATROVENT HFA) 17 MCG/ACT inhaler Inhale 2 puffs into the lungs every 6 (six) hours as needed for wheezing. 01/13/19  Yes Virgie Dad, MD  lamoTRIgine (LAMICTAL) 150 MG tablet Take 1 tablet (150 mg total) by mouth in the morning. 02/09/20  Yes Marcial Pacas, MD  lamoTRIgine 200 MG TBDP Take 1 tablet (200 mg total) by mouth at bedtime. 02/09/20  Yes Marcial Pacas, MD  LOSARTAN POTASSIUM PO Take 100 mg by mouth daily.   Yes  [provider]  metoprolol succinate (TOPROL-XL) 50 MG 24 hr tablet Take 1 tablet (50 mg total) by mouth daily. Take with or immediately following a meal. 02/23/18  Yes Blanchie Serve, MD  zonisamide (ZONEGRAN) 100 MG capsule Take 200 mg by mouth every evening.    Yes [provider]  zonisamide (ZONEGRAN) 100 MG capsule Take 100 mg by mouth in the morning.    Yes [provider]   Allergies  Allergen Reactions   Aspirin Nausea And Vomiting   Effexor [Venlafaxine Hcl]     Seizure   Keflex [Cephalexin]    Levaquin [Levofloxacin]    Venlafaxine Other (See Comments)    seizures   Penicillins Rash    Has patient had a PCN reaction causing immediate rash, facial/tongue/throat swelling, SOB or lightheadedness with hypotension: Yes Has patient had a PCN reaction causing severe rash involving mucus membranes or skin necrosis: No Has patient had a PCN reaction that required hospitalization: No Has patient had a PCN reaction occurring within the last 10 years: No If all of the above answers are "NO", then may proceed with Cephalosporin use.    Zithromax [Azithromycin] Rash   Review of Systems  HENT: Positive for drooling.   Neurological: Positive for speech difficulty.    Physical Exam Constitutional:      Appearance: She is cachectic.  Cardiovascular:     Rate and Rhythm: Normal rate.  Pulmonary:     Effort: Pulmonary effort is normal.  Skin:    General: Skin is warm and dry.  Neurological:     Mental Status: She is alert.     Vital Signs: BP 140/60 (BP Location: Right Arm)    Pulse 76    Temp 98.1 F (36.7 C) (Oral)    Resp 18    Ht 5\' 4"  (1.626 m)    Wt 42.1 kg    SpO2 95%    BMI 15.93 kg/m  Pain Scale: 0-10   Pain Score: 0-No pain   SpO2: SpO2: 95 % O2 Device:SpO2: 95 % O2 Flow Rate: .O2 Flow Rate (L/min): 2 L/min  IO: Intake/output summary:   Intake/Output Summary (Last 24 hours) at 03/12/2020 1403 Last data filed at 03/12/2020  0835 Gross per 24 hour  Intake 30 ml  Output --  Net 30 ml    LBM: Last BM Date: 03/11/20 Baseline Weight: Weight: 42.1 kg Most recent weight: Weight: 42.1 kg     Palliative Assessment/Data: 50 %   Discussed with Dr Sloan Leiter and Dr Vertell Limber via secure chat and social worker/Liz  Time In: 1315 Time Out: 1430 Time Total: 75 minutes Greater than 50%  of this time was spent counseling and coordinating care related to the above assessment and plan.  Signed by: Wadie Lessen, NP   Please contact Palliative Medicine Team phone at [phone] for questions and concerns.  For individual provider: See Shea Evans

## 2020-03-12 NOTE — Progress Notes (Signed)
Subjective: Patient reports she feels her brain is still a bit scrambled and her comprehension is not at her baseline  Objective: Vital signs in last 24 hours: Temp:  [97.6 F (36.4 C)-99 F (37.2 C)] 98.1 F (36.7 C) (06/14 0732) Pulse Rate:  [66-78] 76 (06/14 0732) Resp:  [16-18] 18 (06/14 0732) BP: (116-166)/(55-107) 140/60 (06/14 0732) SpO2:  [93 %-97 %] 95 % (06/14 0732)  Intake/Output from previous day: No intake/output data recorded. Intake/Output this shift: Total I/O In: 30 [P.O.:30] Out: -   Physical Exam: Facial droop and left drift are stable.  Patient is quick to reply to questions, but states it takes her a while to comprehend speech and complex concepts.  Lab Results: No results for input(s): WBC, HGB, HCT, PLT in the last 72 hours. BMET Recent Labs    03/11/20 0330  NA 141  K 4.0  CL 107  CO2 28  GLUCOSE 157*  BUN 27*  CREATININE 1.22*  CALCIUM 9.3    Studies/Results: No results found.  Assessment/Plan: Patient is stable to slightly improved.  I presented her case to our tumor board this AM.  We recommend stereotactic radiosurgery to intracranial mass lesion in three fractions.  Wadie Lessen from [facility] is coming to speak with patient this morning.  Patient is favoring no treatment at this point, but will decide after discussion with Stanton Kidney and her family.  If she wants treatment, pre-radiation planning can be done as an inpatient with three fractions as an outpatient.    LOS: 5 days    Peggyann Shoals, MD 03/12/2020, 9:01 AM

## 2020-03-12 NOTE — Progress Notes (Signed)
STROKE TEAM PROGRESS NOTE   INTERVAL HISTORY: RN at bedside.  Patient reclining in the bed, awake alert, orientated.  [name] saw her this morning, considered treatment if patient wants to proceed.  Patient would like to discuss with palliative care service before making decision.  Vitals:   03/11/20 1937 03/11/20 2331 03/12/20 0307 03/12/20 0732  BP: (!) 120/56 129/63 126/61 140/60  Pulse: 76 72 77 76  Resp: _0 Temp: 97.6 F (36.4 C) 98.1 F (36.7 C) 98.3 F (36.8 C) 98.1 F (36.7 C)  TempSrc: Oral Oral Oral Oral  SpO2: 93% 94% 94% 95%  Weight:      Height:        CBC:  Recent Labs  Lab 03/07/20 1921 03/07/20 1930  WBC 8.3  --   NEUTROABS 5.8  --   HGB 13.7 14.6  HCT 44.6 43.0  MCV 92.7  --   PLT 255  --     Basic Metabolic Panel:  Recent Labs  Lab 03/07/20 1921 03/07/20 1921 03/07/20 1930 03/11/20 0330  NA 136   < > 143 141  K 4.0   < > 4.1 4.0  CL 100   < > 105 107  CO2 24  --   --  28  GLUCOSE 104*   < > 103* 157*  BUN 22   < > 24* 27*  CREATININE 1.38*   < > 1.40* 1.22*  CALCIUM 9.6  --   --  9.3   < > = values in this interval not displayed.    IMAGING past 24 hours No results found.  PHYSICAL EXAM   Temp:  [97.6 F (36.4 C)-99 F (37.2 C)] 98.1 F (36.7 C) (06/14 0732) Pulse Rate:  [72-78] 76 (06/14 0732) Resp:  [18] 18 (06/14 0732) BP: (116-140)/(55-63) 140/60 (06/14 0732) SpO2:  [93 %-95 %] 95 % (06/14 0732)  General - cachectic, well developed, in no apparent distress.  Ophthalmologic - fundi not visualized due to noncooperation.  Cardiovascular - Regular rhythm and rate.  Mental Status -  Level of arousal and orientation to time, place, and person were intact. Language including expression, naming, repetition, comprehension was assessed and found intact.  Cranial Nerves II - XII - II - Visual field intact OU. III, IV, VI - Extraocular movements intact. V - Facial sensation decreased on the left. VII - mild right  nasolabial fold flattening. VIII - Hearing & vestibular intact bilaterally. X - Palate elevates symmetrically. XI - Chin turning & shoulder shrug intact bilaterally. XII - Tongue protrusion intact.  Motor Strength - The patient's strength was normal in all extremities and pronator drift was absent.  Bulk was normal and fasciculations were absent.   Motor Tone - Muscle tone was assessed at the neck and appendages and was normal.  Reflexes - The patient's reflexes were symmetrical in all extremities and she had no pathological reflexes.  Sensory - Light touch, temperature/pinprick were assessed and were decreased on the left, about 60% of the right.    Coordination - The patient had normal movements in the hands grossly intact with no ataxia or dysmetria.  Tremor was absent.  Gait and Station - deferred.   ASSESSMENT/PLAN Jessica [name] is a 86 y.o. female with history of colon cancer, metastatic melanoma, atrial fibrillation not on anticoagulation, hypertension, alcohol abuse, COPD, seizures presenting with 2wk hx of abrupt onset left-sided weakness and numbness. MRI done as an OP shows ICH. Admitted to ICU.  ICH - Right frontoparietal ICH - concerning for metastasis melanoma - hemorrhagic infarct from A. fib less likely  MRI w/o solitary intra-axial hemorrhage vs hemorrhagic mas R operculum. Mild mass effect, no shift. Small vessel disease.   MRI w/ unchanged size hemorrhage. Nodular contrast enhancement posterior aspect of hemorrhage likely tumor.   Carotid Doppler  B ICA 1-39% stenosis, VAs antegrade   2D Echo pending   CT chest/abd/pelvis neg for malignancy  LDL 121  HgbA1c 5.7  SCDs for VTE prophylaxis  aspirin 81 mg daily prior to admission, now on No antithrombotic given hemorrhage   Therapy recommendations:  S[facility] at [facility]  Disposition:  pending - from [facility] ALF level  Brain metastatic tumor  Likely met from [place] on board Vertell Limber)  Dr.  Vertell Limber met with tumor board - stereotactic radiosurgery to mass in 3 fractions recommended if agree for treatment  on decadron  Palliative met with pt - she opted for no treatment, home with Hospice  Atrial Fibrillation  Home anticoagulation:  none , on aspirin 81 PTA  Not an AC candidate given ICH  Currently sinus rhythm  Rate controlled   Hypertension  Home meds:  Losartan 100, metoprolol 25  Off cleviprex now  SBP goal < 160  On prn labetalol and hydralazine  On amlodipine 10, losartan 100, toprol 50  Hyperlipidemia  Home meds:  No statin  LDL 121  Hold off statin for now due to [place]  Tobacco abuse  Current smoker  Smoking cessation counseling provided  Pt is willing to quit  Other Stroke Risk Factors  Advanced age  Hx ETOH use, alcohol level <5, advised to drink no more than 1 drink(s) a day  PVD  Other Active Problems  Hx of colon cancer   Hx of metastatic melanoma   Hx of seizures on lamictal and zonagran, only name brand works per pt - follow up with Dr. Krista [name] at [facility] - has appointment with NP Jessica [name] on 04/17/20 at [place] [facility]  AKI on CKD IIIA, Cre 1.4 -> 1.22   Hospital day # 5   Neurology will sign off. Please call with questions. Pt will follow up with NP Slack at [facility] on 04/17/20. Thanks for the consult.  Jessica Hawking, MD PhD Stroke Neurology 03/12/2020 1:55 PM   To contact Stroke Continuity provider, please refer to [email or web address] After hours, contact General Neurology

## 2020-03-12 NOTE — Progress Notes (Signed)
PROGRESS NOTE    Jessica [name]  NKN:[id number] DOB: [birth year] DOA: 03/07/2020 PCP: Virgie Dad, MD    Brief Narrative:  68 old female with history of colon cancer, metastatic melanoma, atrial fibrillation not on anticoagulation, hypertension, COPD, seizures on antiepileptics admitted to neuro ICU with abrupt onset of left-sided weakness and numbness, retrospectively ongoing for about 2 weeks.  In the emergency room she had left upper extremity drift.  MRI of the brain revealed moderate sized right frontoparietal hemorrhage.  Patient had symptom onset about 2 weeks.  She was seen at neurology clinic for a stroke work-up and was ordered MRI.  Blood pressures are more than 379 systolic on arrival to ER.  She was started on Cleviprex infusion and admitted to neuro ICU. 6/13-transferred out of neuro ICU.   Assessment & Plan:   Active Problems:   ICH (intracerebral hemorrhage) (HCC)  Intracranial hemorrhage: Right frontoparietal intracranial hemorrhage, consistent with metastatic tumor which is consistent with metastatic melanoma. Clinical findings, about 2 weeks of left upper extremity weakness. MRI of the brain, MRI consistent with solitary intra-axial hemorrhage versus hemorrhagic mass right upper: CT scan of the chest abdomen pelvis, no radiological evidence of malignancy. 2D echocardiogram, pending. Antiplatelet therapy, on aspirin at home, discontinued. LDL 121 Hemoglobin A1c, 5.7. DVT prophylaxis, SCDs. Followed by neurosurgery and neurology.  Neurosurgery planning for possible biopsy/radiation.  Pending decision today. Remains on dexamethasone we will continue.  Paroxysmal atrial fibrillation: Currently sinus rhythm.  Rate controlled.  Not on anticoagulation given above.  Was on aspirin at home.  Hypertension: Blood pressure stable.  On losartan and metoprolol.  History of seizure disorder: Stable.  On Lamictal and Zonegran.  Continued.  AKI on CKD stage IIIa:  Creatinine is stable.   DVT prophylaxis: SCD Code Status: DNR Family Communication: None at bedside Disposition Plan: Status is: Inpatient  Remains inpatient appropriate because:Inpatient level of care appropriate due to severity of illness   Dispo: The patient is from: Home              Anticipated d/c is to: Unknown.              Anticipated d/c date is: 2 days              Patient currently is not medically stable to d/c.  Neurosurgery plan pending.         Consultants:   Neurology  Neurosurgery  Procedures:   None  Antimicrobials:   None   Subjective: Patient seen and examined.  Denies any complaints.  She says she still has some problem comprehension otherwise she is as sharp as she could be.  She shows "no no, I am not having surgery, I may have radiation"  Objective: Vitals:   03/11/20 1937 03/11/20 2331 03/12/20 0307 03/12/20 0732  BP: (!) 120/56 129/63 126/61 140/60  Pulse: 76 72 77 76  Resp: 18 18 18 18   Temp: 97.6 F (36.4 C) 98.1 F (36.7 C) 98.3 F (36.8 C) 98.1 F (36.7 C)  TempSrc: Oral Oral Oral Oral  SpO2: 93% 94% 94% 95%  Weight:      Height:        Intake/Output Summary (Last 24 hours) at 03/12/2020 1140 Last data filed at 03/12/2020 0835 Gross per 24 hour  Intake 30 ml  Output --  Net 30 ml   Filed Weights   03/08/20 0330  Weight: 42.1 kg    Examination:  General exam: Appears calm and comfortable. age-appropriate. Respiratory  system: Clear to auscultation. Respiratory effort normal. Central nervous system: Alert and oriented.  Decreased sensation on the left side of the face. Right nasolabial fold flattening. Decreased sensation on the left upper extremity. Psychiatry: Judgement and insight appear normal. Mood & affect appropriate.     Data Reviewed: I have personally reviewed following labs and imaging studies  CBC: Recent Labs  Lab 03/07/20 1921 03/07/20 1930  WBC 8.3  --   NEUTROABS 5.8  --   HGB 13.7 14.6   HCT 44.6 43.0  MCV 92.7  --   PLT 255  --    Basic Metabolic Panel: Recent Labs  Lab 03/07/20 1921 03/07/20 1930 03/11/20 0330  NA 136 143 141  K 4.0 4.1 4.0  CL 100 105 107  CO2 24  --  28  GLUCOSE 104* 103* 157*  BUN 22 24* 27*  CREATININE 1.38* 1.40* 1.22*  CALCIUM 9.6  --  9.3   GFR: Estimated Creatinine Clearance: 22.4 mL/min (A) (by C-G formula based on SCr of 1.22 mg/dL (H)). Liver Function Tests: Recent Labs  Lab 03/07/20 1921  AST 20  ALT 16  ALKPHOS 89  BILITOT 0.4  PROT 7.1  ALBUMIN 4.0   No results for input(s): LIPASE, AMYLASE in the last 168 hours. No results for input(s): AMMONIA in the last 168 hours. Coagulation Profile: Recent Labs  Lab 03/07/20 1921  INR 1.0   Cardiac Enzymes: No results for input(s): CKTOTAL, CKMB, CKMBINDEX, TROPONINI in the last 168 hours. BNP (last 3 results) No results for input(s): PROBNP in the last 8760 hours. HbA1C: No results for input(s): HGBA1C in the last 72 hours. CBG: Recent Labs  Lab 03/07/20 1952  GLUCAP 92   Lipid Profile: No results for input(s): CHOL, HDL, LDLCALC, TRIG, CHOLHDL, LDLDIRECT in the last 72 hours. Thyroid Function Tests: No results for input(s): TSH, T4TOTAL, FREET4, T3FREE, THYROIDAB in the last 72 hours. Anemia Panel: No results for input(s): VITAMINB12, FOLATE, FERRITIN, TIBC, IRON, RETICCTPCT in the last 72 hours. Sepsis Labs: No results for input(s): PROCALCITON, LATICACIDVEN in the last 168 hours.  Recent Results (from the past 240 hour(s))  SARS Coronavirus 2 by RT PCR (hospital order, performed in [facility] hospital lab) Nasopharyngeal Nasopharyngeal Swab     Status: None   Collection Time: 03/07/20  7:55 PM   Specimen: Nasopharyngeal Swab  Result Value Ref Range Status   SARS Coronavirus 2 NEGATIVE NEGATIVE Final    Comment: (NOTE) SARS-CoV-2 target nucleic acids are NOT DETECTED. The SARS-CoV-2 RNA is generally detectable in upper and lower respiratory specimens  during the acute phase of infection. The lowest concentration of SARS-CoV-2 viral copies this assay can detect is 250 copies / mL. A negative result does not preclude SARS-CoV-2 infection and should not be used as the sole basis for treatment or other patient management decisions.  A negative result may occur with improper specimen collection / handling, submission of specimen other than nasopharyngeal swab, presence of viral mutation(s) within the areas targeted by this assay, and inadequate number of viral copies (<250 copies / mL). A negative result must be combined with clinical observations, patient history, and epidemiological information. Fact Sheet for Patients:   StrictlyIdeas.no Fact Sheet for Healthcare Providers: BankingDealers.co.za This test is not yet approved or cleared  by the [place] FDA and has been authorized for detection and/or diagnosis of SARS-CoV-2 by FDA under an Emergency Use Authorization (EUA).  This EUA will remain in effect (meaning this test can be  used) for the duration of the COVID-19 declaration under Section 564(b)(1) of the Act, 21 U.S.C. section 360bbb-3(b)(1), unless the authorization is terminated or revoked sooner. Performed at [facility], [place] [address], [place], [place] [address]   MRSA PCR Screening     Status: None   Collection Time: 03/07/20 11:31 PM   Specimen: Nasopharyngeal  Result Value Ref Range Status   MRSA by PCR NEGATIVE NEGATIVE Final    Comment:        The GeneXpert MRSA Assay (FDA approved for NASAL specimens only), is one component of a comprehensive MRSA colonization surveillance program. It is not intended to diagnose MRSA infection nor to guide or monitor treatment for MRSA infections. Performed at [facility], [place] [address] [place], [place] [address]          Radiology Studies: No results found.      Scheduled Meds: . amLODipine  10  mg Oral Daily  . calcium-vitamin D  1 tablet Oral Daily  . Chlorhexidine Gluconate Cloth  6 each Topical Daily  . dexamethasone (DECADRON) injection  4 mg Intravenous Q6H  . feeding supplement (ENSURE ENLIVE)  237 mL Oral TID BM  . lamoTRIgine  150 mg Oral Daily  . lamoTRIgine  200 mg Oral QHS  . losartan  100 mg Oral Daily  . metoprolol succinate  50 mg Oral Daily  . pantoprazole  40 mg Oral BID  . senna-docusate  1 tablet Oral BID  . zonisamide  100 mg Oral q AM  . zonisamide  200 mg Oral QPM   Continuous Infusions:   LOS: 5 days    Time spent: 25 minutes    Barb Merino, MD [facility] Pager [phone]

## 2020-03-12 NOTE — Progress Notes (Signed)
Physical Therapy Treatment Patient Details Name: Jessica [name] MRN: [id number] DOB: [birth year] Today's Date: 03/12/2020    History of Present Illness 86 y.o. female with past medical history of colon cancer, metastatic melanoma fibrillation not on anticoagulation, hypertension, alcohol abuse, COPD, seizures with abrupt onset left-sided weakness and numbness around 2 weeks presents to emergency department after MRI brain outpatient revealed a moderate sized right frontoparietal hemorrhage. MRI  - Unchanged size of hemorrhage centered at the right frontoparietaljunction. There is an area of nodular contrast enhancement withinthe posterior aspect of the hemorrhage, likely indicating the presence of tumor..     PT Comments    Pt progressing towards physical therapy goals. She was easily irritated with therapist and did not want to be assisted. Overall mobilized fairly well without an AD in room. Focus of session was mobility to the bathroom and pt unwilling to do much else afterwards. Pt was very interested in taking a shower, and RN present at end of session to offer the pt a shower which seemed to make her happy. Will continue to follow and progress as able per POC.     Follow Up Recommendations  Other (comment);SNF ([facility])     Equipment Recommendations  None recommended by PT    Recommendations for Other Services       Precautions / Restrictions Precautions Precautions: Fall Restrictions Weight Bearing Restrictions: No    Mobility  Bed Mobility Overal bed mobility: Modified Independent                Transfers Overall transfer level: Needs assistance Equipment used: None Transfers: Sit to/from Stand Sit to Stand: Supervision         General transfer comment: No assist required. Pt was able to power-up to full stand from low bed height.  Ambulation/Gait Ambulation/Gait assistance: Supervision Gait Distance (Feet): 25 Feet Assistive device:  None Gait Pattern/deviations: Step-through pattern Gait velocity: Decreased Gait velocity interpretation: <1.8 ft/sec, indicate of risk for recurrent falls General Gait Details: Mildly unsteady at times. I would have felt more comfortable with min guard assist however pt insistent that therapist not assist her.    Stairs             Wheelchair Mobility    Modified Rankin (Stroke Patients Only) Modified Rankin (Stroke Patients Only) Pre-Morbid Rankin Score: No symptoms Modified Rankin: Moderate disability     Balance Overall balance assessment: Needs assistance Sitting-balance support: Feet supported Sitting balance-Leahy Scale: Good       Standing balance-Leahy Scale: Fair Standing balance comment: statically                            Cognition Arousal/Alertness: Awake/alert Behavior During Therapy: Impulsive;WFL for tasks assessed/performed Overall Cognitive Status: Within Functional Limits for tasks assessed Area of Impairment: Attention;Safety/judgement;Awareness                   Current Attention Level: Selective     Safety/Judgement: Decreased awareness of deficits;Decreased awareness of safety Awareness: Emergent          Exercises      General Comments        Pertinent Vitals/Pain Pain Assessment: No/denies pain    Home Living                      Prior Function            PT Goals (current goals can now  be found in the care plan section) Acute Rehab PT Goals Patient Stated Goal: To take a shower PT Goal Formulation: With patient Time For Goal Achievement: 03/22/20 Potential to Achieve Goals: Good Progress towards PT goals: Progressing toward goals    Frequency    Min 3X/week      PT Plan Current plan remains appropriate    Co-evaluation              AM-PAC PT "6 Clicks" Mobility   Outcome Measure  Help needed turning from your back to your side while in a flat bed without using  bedrails?: None Help needed moving from lying on your back to sitting on the side of a flat bed without using bedrails?: None Help needed moving to and from a bed to a chair (including a wheelchair)?: A Little Help needed standing up from a chair using your arms (e.g., wheelchair or bedside chair)?: A Little Help needed to walk in hospital room?: A Little Help needed climbing 3-5 steps with a railing? : A Little 6 Click Score: 20    End of Session Equipment Utilized During Treatment: Gait belt Activity Tolerance: Patient tolerated treatment well Patient left: in chair;with chair alarm set;with nursing/sitter in room Nurse Communication: Mobility status PT Visit Diagnosis: Unsteadiness on feet (R26.81);Other symptoms and signs involving the nervous system (R29.898) Hemiplegia - Right/Left: Left Hemiplegia - dominant/non-dominant: Non-dominant Hemiplegia - caused by: Unspecified     Time: 8768-1157 PT Time Calculation (min) (ACUTE ONLY): 14 min  Charges:  $Gait Training: 8-22 mins                     Jessica [name], PT, DPT Acute Rehabilitation Services Pager: [phone] Office: [phone]    Jessica [name] 03/12/2020, 1:15 PM

## 2020-03-12 NOTE — Progress Notes (Addendum)
Manufacturing engineer [facility])  Referral received for hospice services at [facility].   Contacted daughter and son who would like hospice. Advised they were wanting her to be moved to [place] to be near them and could we assist with that transfer.  Advised family it is contingent upon her stability once she is at [facility].  Family requested [facility] [place] be contacted for residential hospice.  If they do not have a bed, they are agreeable for her to go to [place].  [facility] will continue to follow and assist.  Venia Carbon RN, BSN, CCRN  **PMT note noted from consult yesterday about ineligibility for inpatient hospice.  Will begin preparations for hospice at SNF.

## 2020-03-13 MED ORDER — AMLODIPINE BESYLATE 10 MG PO TABS: 10.0000 mg | ORAL_TABLET | Freq: Every day | ORAL | Status: AC

## 2020-03-13 MED ORDER — DEXAMETHASONE 4 MG PO TABS
ORAL_TABLET | ORAL | 0 refills | Status: DC
Start: 2020-03-13 — End: 2020-04-03

## 2020-03-13 NOTE — TOC Transition Note (Signed)
Transition of Care [facility]) - CM/SW Discharge Note   Patient Details  Name: Jessica [name] MRN: [id number] Date of Birth: [birth year]  [facility] CM/SW Contact:  Jessica Ochs, LCSW Phone Number: 03/13/2020, 12:32 PM   Clinical Narrative:   Nurse to call report to [phone]. Going to [place] [address]    Final next level of care: [place] Barriers to Discharge: Barriers Resolved   Patient Goals and CMS Choice Patient states their goals for this hospitalization and ongoing recovery are:: to go back to ALF CMS Medicare.gov Compare Post Acute Care list provided to:: Patient Represenative (must comment) (Daughter [name]) Choice offered to / list presented to : Adult Children [name])  Discharge Placement              Patient chooses bed at: [facility] Patient to be transferred to facility by: Family car Name of family member notified: Self, son Patient and family notified of of transfer: 03/13/20  Discharge Plan and Services                                     Social Determinants of Health (SDOH) Interventions     Readmission Risk Interventions No flowsheet data found.

## 2020-03-13 NOTE — Discharge Summary (Signed)
Physician Discharge Summary  Jessica [name] IFO:[id number] DOB: 11/18/1931 DOA: 03/07/2020  PCP: Virgie Dad, MD  Admit date: 03/07/2020 Discharge date: 03/13/2020  Admitted From: Skilled nursing facility Disposition: Skilled nursing facility  Recommendations for Outpatient Follow-up:  1. As per hospice plan   Discharge Condition: Stable CODE STATUS: DNR Diet recommendation: Regular diet, low-salt diet  Discharge summary: 86 old female with history of colon cancer, metastatic melanoma, atrial fibrillation not on anticoagulation, hypertension, COPD, seizures on antiepileptics admitted to neuro ICU with abrupt onset of left-sided weakness and numbness, retrospectively ongoing for about 2 weeks.  In the emergency room she had left upper extremity drift.  MRI of the brain revealed moderate sized right frontoparietal hemorrhage.  Patient had symptom onset about 2 weeks.  She was seen at neurology clinic for a stroke work-up and was ordered MRI.  Blood pressures are more than 676 systolic on arrival to ER.  She was started on Cleviprex infusion and admitted to neuro ICU. 6/13-transferred out of neuro ICU.  Intracranial hemorrhage, right frontoparietal intracranial hemorrhage consistent with metastatic brain tumor.  A solitary metastatic melanoma suspected.   Clinical findings, about 2 weeks of left upper extremity weakness. MRI of the brain, MRI consistent with solitary intra-axial hemorrhage versus hemorrhagic mass. CT scan of the chest abdomen pelvis, no radiological evidence of malignancy. 2D echocardiogram, normal. Antiplatelet therapy, on aspirin at home, discontinued. LDL 121, no benefits of a statin.  Limited life expectancy. Hemoglobin A1c, 5.7 Patient was followed by neurology, neurosurgery and palliative care.  Neurosurgery discussed about brain biopsy, radiation therapy however patient opted not to do any treatment rather stay comfortable, stay at skilled nursing facility and  enroll into hospice program.  Care plan and recommendations: To be seen by hospice and enrolled into hospice program at skilled nursing facility.  Stable to go back today. Good blood pressure control, added amlodipine, she already takes losartan and metoprolol. Steroid prophylaxis for intracranial pressure, dexamethasone on tapering doses. History of seizure disorder, currently stable on Lamictal and Zonegran.  She takes generic medications. Patient currently not in any distress, no need for pain medications. Further plan of care as per hospice provider.  Discharge Diagnoses:  Active Problems:   Weakness generalized   ICH (intracerebral hemorrhage) (HCC)   Malignant brain tumor [facility])   Palliative care by specialist   DNR (do not resuscitate)    Discharge Instructions  Discharge Instructions    Diet general   Complete by: As directed    Increase activity slowly   Complete by: As directed      Allergies as of 03/13/2020      Reactions   Aspirin Nausea And Vomiting   Effexor [venlafaxine Hcl]    Seizure   Keflex [cephalexin]    Levaquin [levofloxacin]    Venlafaxine Other (See Comments)   seizures   Penicillins Rash   Has patient had a PCN reaction causing immediate rash, facial/tongue/throat swelling, SOB or lightheadedness with hypotension: Yes Has patient had a PCN reaction causing severe rash involving mucus membranes or skin necrosis: No Has patient had a PCN reaction that required hospitalization: No Has patient had a PCN reaction occurring within the last 10 years: No If all of the above answers are "NO", then may proceed with Cephalosporin use.   Zithromax [azithromycin] Rash      Medication List    STOP taking these medications   aspirin EC 81 MG tablet     TAKE these medications   amLODipine 10  MG tablet Commonly known as: NORVASC Take 1 tablet (10 mg total) by mouth daily. Start taking on: March 14, 2020   Calcium 600/Vitamin D3 600-800 MG-UNIT  Tabs Generic drug: Calcium Carb-Cholecalciferol Take 1 tablet by mouth daily.   dexamethasone 4 MG tablet Commonly known as: DECADRON 1 tabs QID for 3 days 1 tabs TID for 3 days 1 tabs BID for 3 days 1 tab daily for 3 days   esomeprazole 40 MG capsule Commonly known as: NEXIUM Take 40 mg by mouth daily at 12 noon.   ipratropium 17 MCG/ACT inhaler Commonly known as: ATROVENT HFA Inhale 2 puffs into the lungs every 6 (six) hours as needed for wheezing.   lamoTRIgine 150 MG tablet Commonly known as: LAMICTAL Take 1 tablet (150 mg total) by mouth in the morning.   lamoTRIgine 200 MG Tbdp Take 1 tablet (200 mg total) by mouth at bedtime.   LOSARTAN POTASSIUM PO Take 100 mg by mouth daily.   metoprolol succinate 50 MG 24 hr tablet Commonly known as: TOPROL-XL Take 1 tablet (50 mg total) by mouth daily. Take with or immediately following a meal.   zonisamide 100 MG capsule Commonly known as: ZONEGRAN Take 200 mg by mouth every evening.   zonisamide 100 MG capsule Commonly known as: ZONEGRAN Take 100 mg by mouth in the morning.       Contact information for follow-up providers    Suzzanne Cloud, NP. Go on 04/17/2020.   Specialty: Neurology Contact information: [address] [place] [address] [phone]            Contact information for after-discharge care    Destination    [facility] .   Service: Skilled Nursing Contact information: [place] [phone]                 Allergies  Allergen Reactions  . Aspirin Nausea And Vomiting  . Effexor [Venlafaxine Hcl]     Seizure  . Keflex [Cephalexin]   . Levaquin [Levofloxacin]   . Venlafaxine Other (See Comments)    seizures  . Penicillins Rash    Has patient had a PCN reaction causing immediate rash, facial/tongue/throat swelling, SOB or lightheadedness with hypotension: Yes Has patient had a PCN reaction causing severe rash  involving mucus membranes or skin necrosis: No Has patient had a PCN reaction that required hospitalization: No Has patient had a PCN reaction occurring within the last 10 years: No If all of the above answers are "NO", then may proceed with Cephalosporin use.   Azucena Fallen [Azithromycin] Rash    Consultations:  Neurology  Neurosurgery  Palliative medicine   Procedures/Studies: CT CHEST W CONTRAST  Result Date: 03/09/2020 CLINICAL DATA:  Hemorrhagic brain mass. Staging. Per previous radiology reports, history of melanoma and carcinoid tumor. EXAM: CT CHEST, ABDOMEN, AND PELVIS WITH CONTRAST TECHNIQUE: Multidetector CT imaging of the chest, abdomen and pelvis was performed following the standard protocol during bolus administration of intravenous contrast. CONTRAST:  3mL OMNIPAQUE IOHEXOL 300 MG/ML  SOLN COMPARISON:  PET-CT 10/31/2010.  Chest CTA 10/10/2011. FINDINGS: CT CHEST FINDINGS Cardiovascular: Extensive atherosclerosis of the aorta, great vessels and coronary arteries. No acute vascular findings are identified. There are calcifications of the aortic valve. The heart size is normal. There is no pericardial effusion. Mediastinum/Nodes: There are no enlarged mediastinal, hilar or axillary lymph nodes. Small mediastinal and hilar lymph nodes are stable, some calcified. The thyroid gland, esophagus and  trachea demonstrate no significant findings. Lungs/Pleura: No pleural effusion or pneumothorax. Moderate centrilobular emphysema with stable biapical scarring and scattered subpleural reticulation. There is a calcified right upper lobe granuloma. The aeration of the lung bases has improved compared with the prior study. No suspicious pulmonary nodules. Musculoskeletal/Chest wall: No chest wall mass or suspicious osseous findings. Inferior endplate compression deformity at T9 is new compared with prior chest CTA, but does not appear acute. CT ABDOMEN AND PELVIS FINDINGS Hepatobiliary: The liver  is normal in density without suspicious focal abnormality. No evidence of gallstones, gallbladder wall thickening or biliary dilatation. Pancreas: Unremarkable. No pancreatic ductal dilatation or surrounding inflammatory changes. Spleen: Normal in size without suspicious focal abnormality. Scattered small calcified granulomas are noted. Adrenals/Urinary Tract: Mild thickening of the left adrenal gland appears similar to previous PET-CT. The right adrenal gland appears normal. Bilateral renal vascular calcifications and probable nephrolithiasis. No evidence of ureteral calculus, hydronephrosis or renal mass. The bladder appears normal. Stomach/Bowel: No evidence of bowel wall thickening, distention or surrounding inflammatory change. Moderate stool throughout the colon. No bowel lesions identified. Vascular/Lymphatic: There are no enlarged abdominal or pelvic lymph nodes. There is extensive aortic and branch vessel atherosclerosis with mild dilatation of the abdominal aorta to 2.3 cm. No focal aneurysm or acute vascular findings. The portal, superior mesenteric and splenic veins are patent. Reproductive: Hysterectomy.  No adnexal mass. Other: No evidence of abdominal wall mass or hernia. No ascites. Musculoskeletal: No acute or significant osseous findings. Interval superior endplate compression deformity at L2 does not appear acute. The T9 and L2 compression deformities are visible on chest radiographs 11/28/2018. IMPRESSION: 1. No evidence of metastatic disease within the chest, abdomen or pelvis. No primary malignancy identified. 2. Sequela of prior granulomatous disease. 3. Chronic T9 and L2 compression deformities. 4. Aortic Atherosclerosis (ICD10-I70.0) and Emphysema (ICD10-J43.9). Electronically Signed   By: Richardean Sale M.D.   On: 03/09/2020 08:06   MR BRAIN WO CONTRAST  Result Date: 03/07/2020 CLINICAL DATA:  86 year old female with left side deficits, suspected stroke. History of colon, ovarian, and  melanoma malignancy reported. This is an outpatient who was scanned after hours, office of Dr. Marcial Pacas was contacted but is closed. EXAM: MRI HEAD WITHOUT CONTRAST TECHNIQUE: Multiplanar, multiecho pulse sequences of the brain and surrounding structures were obtained without intravenous contrast. COMPARISON:  Head CT 11/28/2018.  Brain MRI 05/20/2017. FINDINGS: Brain: Oval intra-axial masslike area of heterogeneous signal measuring about 46 x 35 x 33 mm (AP by transverse by CC) is centered at the right frontal operculum. There are internal areas of mixed hyperintense and hypointense T1/T2 and FLAIR signal. Some of the areas are restricted on diffusion (series 6 image 67) although there is no surrounding regional diffusion restriction. Much of the area is dark on susceptibility weighted imaging consistent with hemorrhage. There is surrounding T2 and FLAIR hyperintensity in a vasogenic edema pattern. There is no intraventricular extension of blood. No definite extra-axial extension of blood. Mass effect on the right lateral ventricle with no significant midline shift at this time (series 11, image 16). Basilar cisterns remain normal. There is no additional area of similar signal abnormality in the brain. And on SWI imaging no additional cerebral blood products are identified. No ventriculomegaly. Patchy, mild to moderate for age other nonspecific bilateral cerebral white matter T2 and FLAIR hyperintensity with no definite cortical encephalomalacia. T2 heterogeneity throughout the deep gray matter nuclei is chronic and more resembles perivascular spaces but a degree of chronic lacunar infarcts are  also possible. Unchanged since 2018 small chronic infarct in the left cerebellum (series 10, image 8). Cervicomedullary junction and pituitary are within normal limits. Vascular: Major intracranial vascular flow voids are stable since 2018. Skull and upper cervical spine: Negative for age visible cervical spine. Visualized  bone marrow signal is within normal limits. Sinuses/Orbits: Postoperative changes to both globes, otherwise negative orbits. Paranasal Visualized paranasal sinuses and mastoids are stable and well pneumatized. Other: Visible internal auditory structures appear normal. Scalp and face soft tissues appear negative. IMPRESSION: 1. Solitary intra-axial hemorrhage versus hemorrhagic mass at the right operculum measuring up to 4.6 cm (volume 27 mL). Regional vasogenic edema and mild mass effect with no midline shift at this time. No extra-axial or intraventricular extension of blood. There is no larger surrounding area of ischemia to strongly suggest a hemorrhagic infarct, and this is indeterminate for a small vessel versus tumor related bleed. Post-contrast MRI images would be valuable, although a repeat MRI without and with contrast once the hematoma has resolved would be most specific. 2. Evidence of chronic small vessel disease otherwise stable since the 2018 MRI. I recommended this patient be seen in an Emergency Department, and her family was agreeable to transport her by private vehicle from [place] to the nearby [facility] ED. In anticipation of the patient's arrival, salient findings were discussed by telephone with the [facility] ED Charge Nurse on 03/07/2020 at 18:33 . Electronically Signed   By: Genevie Ann M.D.   On: 03/07/2020 18:42   MR BRAIN W CONTRAST  Result Date: 03/08/2020 CLINICAL DATA:  Intracranial mass EXAM: MRI HEAD WITH CONTRAST TECHNIQUE: Multiplanar, multiecho pulse sequences of the brain and surrounding structures were obtained with intravenous contrast. CONTRAST:  4.17mL GADAVIST GADOBUTROL 1 MMOL/ML IV SOLN COMPARISON:  Brain MRI without contrast 03/07/2020 FINDINGS: Unchanged size of hemorrhage centered at the right frontoparietal junction. There is an area of nodular contrast enhancement within the posterior aspect of the hemorrhage that measures 1.6 x 1.3 cm (series 7, image 37). No other site  of abnormal contrast enhancement. The degree of surrounding edema is unchanged. No midline shift. IMPRESSION: 1. Unchanged size of hemorrhage centered at the right frontoparietal junction. There is an area of nodular contrast enhancement within the posterior aspect of the hemorrhage, likely indicating the presence of tumor. Follow-up study when the hematoma has had time to resolve is suggested. 2. No other contrast-enhancing lesion. Electronically Signed   By: Ulyses Jarred M.D.   On: 03/08/2020 03:34   CT ABDOMEN PELVIS W CONTRAST  Result Date: 03/09/2020 CLINICAL DATA:  Hemorrhagic brain mass. Staging. Per previous radiology reports, history of melanoma and carcinoid tumor. EXAM: CT CHEST, ABDOMEN, AND PELVIS WITH CONTRAST TECHNIQUE: Multidetector CT imaging of the chest, abdomen and pelvis was performed following the standard protocol during bolus administration of intravenous contrast. CONTRAST:  64mL OMNIPAQUE IOHEXOL 300 MG/ML  SOLN COMPARISON:  PET-CT 10/31/2010.  Chest CTA 10/10/2011. FINDINGS: CT CHEST FINDINGS Cardiovascular: Extensive atherosclerosis of the aorta, great vessels and coronary arteries. No acute vascular findings are identified. There are calcifications of the aortic valve. The heart size is normal. There is no pericardial effusion. Mediastinum/Nodes: There are no enlarged mediastinal, hilar or axillary lymph nodes. Small mediastinal and hilar lymph nodes are stable, some calcified. The thyroid gland, esophagus and trachea demonstrate no significant findings. Lungs/Pleura: No pleural effusion or pneumothorax. Moderate centrilobular emphysema with stable biapical scarring and scattered subpleural reticulation. There is a calcified right upper lobe granuloma. The aeration of the  lung bases has improved compared with the prior study. No suspicious pulmonary nodules. Musculoskeletal/Chest wall: No chest wall mass or suspicious osseous findings. Inferior endplate compression deformity at T9  is new compared with prior chest CTA, but does not appear acute. CT ABDOMEN AND PELVIS FINDINGS Hepatobiliary: The liver is normal in density without suspicious focal abnormality. No evidence of gallstones, gallbladder wall thickening or biliary dilatation. Pancreas: Unremarkable. No pancreatic ductal dilatation or surrounding inflammatory changes. Spleen: Normal in size without suspicious focal abnormality. Scattered small calcified granulomas are noted. Adrenals/Urinary Tract: Mild thickening of the left adrenal gland appears similar to previous PET-CT. The right adrenal gland appears normal. Bilateral renal vascular calcifications and probable nephrolithiasis. No evidence of ureteral calculus, hydronephrosis or renal mass. The bladder appears normal. Stomach/Bowel: No evidence of bowel wall thickening, distention or surrounding inflammatory change. Moderate stool throughout the colon. No bowel lesions identified. Vascular/Lymphatic: There are no enlarged abdominal or pelvic lymph nodes. There is extensive aortic and branch vessel atherosclerosis with mild dilatation of the abdominal aorta to 2.3 cm. No focal aneurysm or acute vascular findings. The portal, superior mesenteric and splenic veins are patent. Reproductive: Hysterectomy.  No adnexal mass. Other: No evidence of abdominal wall mass or hernia. No ascites. Musculoskeletal: No acute or significant osseous findings. Interval superior endplate compression deformity at L2 does not appear acute. The T9 and L2 compression deformities are visible on chest radiographs 11/28/2018. IMPRESSION: 1. No evidence of metastatic disease within the chest, abdomen or pelvis. No primary malignancy identified. 2. Sequela of prior granulomatous disease. 3. Chronic T9 and L2 compression deformities. 4. Aortic Atherosclerosis (ICD10-I70.0) and Emphysema (ICD10-J43.9). Electronically Signed   By: Richardean Sale M.D.   On: 03/09/2020 08:06   VAS US CAROTID  Result Date:  03/06/2020 Carotid Arterial Duplex Study Indications:       Numbness. Risk Factors:      Hypertension, hyperlipidemia, PAD. Comparison Study:  No prior study Performing Technologist: Maudry Mayhew MHA, RDMS, RVT, RDCS  Examination Guidelines: A complete evaluation includes B-mode imaging, spectral Doppler, color Doppler, and power Doppler as needed of all accessible portions of each vessel. Bilateral testing is considered an integral part of a complete examination. Limited examinations for reoccurring indications may be performed as noted.  Right Carotid Findings: +----------+--------+-------+--------+--------------------------------+--------+           PSV cm/sEDV    StenosisPlaque Description              Comments                   cm/s                                                    +----------+--------+-------+--------+--------------------------------+--------+ CCA Prox  78      9                                                       +----------+--------+-------+--------+--------------------------------+--------+ CCA Distal61      12             smooth, heterogenous and  calcific                                 +----------+--------+-------+--------+--------------------------------+--------+ ICA Prox  59      11             smooth, heterogenous and                                                  calcific                                 +----------+--------+-------+--------+--------------------------------+--------+ ICA Distal76      17                                                      +----------+--------+-------+--------+--------------------------------+--------+ ECA       166     14             heterogenous                             +----------+--------+-------+--------+--------------------------------+--------+ +----------+--------+-------+----------------+-------------------+            PSV cm/sEDV cmsDescribe        Arm Pressure (mmHG) +----------+--------+-------+----------------+-------------------+ [id number]             Multiphasic, WNL                    +----------+--------+-------+----------------+-------------------+ +---------+--------+--+--------+--+---------+ VertebralPSV cm/s77EDV cm/s16Antegrade +---------+--------+--+--------+--+---------+  Left Carotid Findings: +----------+-------+-------+--------+---------------------------------+--------+           PSV    EDV    StenosisPlaque Description               Comments           cm/s   cm/s                                                     +----------+-------+-------+--------+---------------------------------+--------+ CCA Prox  80     12                                                       +----------+-------+-------+--------+---------------------------------+--------+ CCA Distal51     13             smooth and heterogenous                   +----------+-------+-------+--------+---------------------------------+--------+ ICA Prox  55     12             heterogenous, irregular and  calcific                                  +----------+-------+-------+--------+---------------------------------+--------+ ICA Distal117    32                                                       +----------+-------+-------+--------+---------------------------------+--------+ ECA       63     3                                                        +----------+-------+-------+--------+---------------------------------+--------+ +----------+--------+--------+----------------+-------------------+           PSV cm/sEDV cm/sDescribe        Arm Pressure (mmHG) +----------+--------+--------+----------------+-------------------+ [id number]              Multiphasic, WNL                     +----------+--------+--------+----------------+-------------------+ +---------+--------+--+--------+-+---------+ VertebralPSV cm/s40EDV cm/s7Antegrade +---------+--------+--+--------+-+---------+   Summary: Right Carotid: Velocities in the right ICA are consistent with a 1-39% stenosis. Left Carotid: Velocities in the left ICA are consistent with a 1-39% stenosis. Vertebrals:  Bilateral vertebral arteries demonstrate antegrade flow. Subclavians: Normal flow hemodynamics were seen in bilateral subclavian              arteries. *See table(s) above for measurements and observations.  Electronically signed by Deitra Mayo MD on 03/06/2020 at 4:05:55 PM.    Final    (Echo, Carotid, EGD, Colonoscopy, ERCP)    Subjective: Patient seen and examined.  No overnight events.  Son at the bedside.  She is ready to be discharged, she stated that she can walk out of the hospital.  Very excited to go back to friend's home.   Discharge Exam: Vitals:   03/13/20 0340 03/13/20 0742  BP: 131/76 (!) 138/59  Pulse: 75 62  Resp: 20 18  Temp: 98.3 F (36.8 C) 98.1 F (36.7 C)  SpO2: 99% 98%   Vitals:   03/12/20 1957 03/12/20 2319 03/13/20 0340 03/13/20 0742  BP: 139/66 133/65 131/76 (!) 138/59  Pulse: 77 71 75 62  Resp: 18 18 20 18   Temp: 97.8 F (36.6 C) 98 F (36.7 C) 98.3 F (36.8 C) 98.1 F (36.7 C)  TempSrc: Oral Oral Oral Oral  SpO2: 96% 98% 99% 98%  Weight:      Height:        General: Pt is alert, awake, not in acute distress Cardiovascular: RRR, S1/S2 +, no rubs, no gallops Respiratory: CTA bilaterally, no wheezing, no rhonchi Abdominal: Soft, NT, ND, bowel sounds + Extremities: no edema, no cyanosis Cranial nerve, mild facial droop. Left upper extremity hemiparesis.    The results of significant diagnostics from this hospitalization (including imaging, microbiology, ancillary and laboratory) are listed below for reference.     Microbiology: Recent Results (from the past  240 hour(s))  SARS Coronavirus 2 by RT PCR (hospital order, performed in [facility] hospital lab) Nasopharyngeal Nasopharyngeal Swab     Status: None   Collection Time: 03/07/20  7:55 PM   Specimen: Nasopharyngeal Swab  Result  Value Ref Range Status   SARS Coronavirus 2 NEGATIVE NEGATIVE Final    Comment: (NOTE) SARS-CoV-2 target nucleic acids are NOT DETECTED. The SARS-CoV-2 RNA is generally detectable in upper and lower respiratory specimens during the acute phase of infection. The lowest concentration of SARS-CoV-2 viral copies this assay can detect is 250 copies / mL. A negative result does not preclude SARS-CoV-2 infection and should not be used as the sole basis for treatment or other patient management decisions.  A negative result may occur with improper specimen collection / handling, submission of specimen other than nasopharyngeal swab, presence of viral mutation(s) within the areas targeted by this assay, and inadequate number of viral copies (<250 copies / mL). A negative result must be combined with clinical observations, patient history, and epidemiological information. Fact Sheet for Patients:   StrictlyIdeas.no Fact Sheet for Healthcare Providers: BankingDealers.co.za This test is not yet approved or cleared  by the [place] FDA and has been authorized for detection and/or diagnosis of SARS-CoV-2 by FDA under an Emergency Use Authorization (EUA).  This EUA will remain in effect (meaning this test can be used) for the duration of the COVID-19 declaration under Section 564(b)(1) of the Act, 21 U.S.C. section 360bbb-3(b)(1), unless the authorization is terminated or revoked sooner. Performed at [facility], [place] [address], [place], [place] [address]   MRSA PCR Screening     Status: None   Collection Time: 03/07/20 11:31 PM   Specimen: Nasopharyngeal  Result Value Ref Range Status   MRSA by PCR NEGATIVE  NEGATIVE Final    Comment:        The GeneXpert MRSA Assay (FDA approved for NASAL specimens only), is one component of a comprehensive MRSA colonization surveillance program. It is not intended to diagnose MRSA infection nor to guide or monitor treatment for MRSA infections. Performed at [facility], [place] [address], [place], [place] [address]      Labs: BNP (last 3 results) No results for input(s): BNP in the last 8760 hours. Basic Metabolic Panel: Recent Labs  Lab 03/07/20 1921 03/07/20 1930 03/11/20 0330  NA 136 143 141  K 4.0 4.1 4.0  CL 100 105 107  CO2 24  --  28  GLUCOSE 104* 103* 157*  BUN 22 24* 27*  CREATININE 1.38* 1.40* 1.22*  CALCIUM 9.6  --  9.3   Liver Function Tests: Recent Labs  Lab 03/07/20 1921  AST 20  ALT 16  ALKPHOS 89  BILITOT 0.4  PROT 7.1  ALBUMIN 4.0   No results for input(s): LIPASE, AMYLASE in the last 168 hours. No results for input(s): AMMONIA in the last 168 hours. CBC: Recent Labs  Lab 03/07/20 1921 03/07/20 1930  WBC 8.3  --   NEUTROABS 5.8  --   HGB 13.7 14.6  HCT 44.6 43.0  MCV 92.7  --   PLT 255  --    Cardiac Enzymes: No results for input(s): CKTOTAL, CKMB, CKMBINDEX, TROPONINI in the last 168 hours. BNP: Invalid input(s): POCBNP CBG: Recent Labs  Lab 03/07/20 1952  GLUCAP 92   D-Dimer No results for input(s): DDIMER in the last 72 hours. Hgb A1c No results for input(s): HGBA1C in the last 72 hours. Lipid Profile No results for input(s): CHOL, HDL, LDLCALC, TRIG, CHOLHDL, LDLDIRECT in the last 72 hours. Thyroid function studies No results for input(s): TSH, T4TOTAL, T3FREE, THYROIDAB in the last 72 hours.  Invalid input(s): FREET3 Anemia work up No results for input(s):  VITAMINB12, FOLATE, FERRITIN, TIBC, IRON, RETICCTPCT in the last 72 hours. Urinalysis    Component Value Date/Time   COLORURINE YELLOW 11/28/2018 1019   APPEARANCEUR HAZY (A) 11/28/2018 1019   LABSPEC 1.020 11/28/2018 1019    PHURINE 6.0 11/28/2018 1019   GLUCOSEU NEGATIVE 11/28/2018 1019   HGBUR NEGATIVE 11/28/2018 1019   BILIRUBINUR NEGATIVE 11/28/2018 1019   KETONESUR NEGATIVE 11/28/2018 1019   PROTEINUR 100 (A) 11/28/2018 1019   UROBILINOGEN 1.0 11/25/2011 2313   NITRITE NEGATIVE 11/28/2018 1019   LEUKOCYTESUR TRACE (A) 11/28/2018 1019   Sepsis Labs Invalid input(s): PROCALCITONIN,  WBC,  LACTICIDVEN Microbiology Recent Results (from the past 240 hour(s))  SARS Coronavirus 2 by RT PCR (hospital order, performed in [place] [facility] lab) Nasopharyngeal Nasopharyngeal Swab     Status: None   Collection Time: 03/07/20  7:55 PM   Specimen: Nasopharyngeal Swab  Result Value Ref Range Status   SARS Coronavirus 2 NEGATIVE NEGATIVE Final    Comment: (NOTE) SARS-CoV-2 target nucleic acids are NOT DETECTED. The SARS-CoV-2 RNA is generally detectable in upper and lower respiratory specimens during the acute phase of infection. The lowest concentration of SARS-CoV-2 viral copies this assay can detect is 250 copies / mL. A negative result does not preclude SARS-CoV-2 infection and should not be used as the sole basis for treatment or other patient management decisions.  A negative result may occur with improper specimen collection / handling, submission of specimen other than nasopharyngeal swab, presence of viral mutation(s) within the areas targeted by this assay, and inadequate number of viral copies (<250 copies / mL). A negative result must be combined with clinical observations, patient history, and epidemiological information. Fact Sheet for Patients:   StrictlyIdeas.no Fact Sheet for Healthcare Providers: BankingDealers.co.za This test is not yet approved or cleared  by the [place] FDA and has been authorized for detection and/or diagnosis of SARS-CoV-2 by FDA under an Emergency Use Authorization (EUA).  This EUA will remain in effect (meaning  this test can be used) for the duration of the COVID-19 declaration under Section 564(b)(1) of the Act, 21 U.S.C. section 360bbb-3(b)(1), unless the authorization is terminated or revoked sooner. Performed at [facility], [place] [address] [place], [place] [address]   MRSA PCR Screening     Status: None   Collection Time: 03/07/20 11:31 PM   Specimen: Nasopharyngeal  Result Value Ref Range Status   MRSA by PCR NEGATIVE NEGATIVE Final    Comment:        The GeneXpert MRSA Assay (FDA approved for NASAL specimens only), is one component of a comprehensive MRSA colonization surveillance program. It is not intended to diagnose MRSA infection nor to guide or monitor treatment for MRSA infections. Performed at [facility], [place] [address] [place], [place] [address]      Time coordinating discharge:  35 minutes  SIGNED:   Barb Merino, MD  [facility] 03/13/2020, 11:34 AM

## 2020-03-14 ENCOUNTER — Telehealth: Payer: Self-pay

## 2020-03-14 ENCOUNTER — Encounter: Payer: Self-pay | Admitting: Nurse Practitioner

## 2020-03-14 ENCOUNTER — Non-Acute Institutional Stay (SKILLED_NURSING_FACILITY): Payer: Medicare Other | Admitting: Nurse Practitioner

## 2020-03-14 DIAGNOSIS — R200 Anesthesia of skin: Secondary | ICD-10-CM

## 2020-03-14 DIAGNOSIS — F418 Other specified anxiety disorders: Secondary | ICD-10-CM

## 2020-03-14 DIAGNOSIS — G40309 Generalized idiopathic epilepsy and epileptic syndromes, not intractable, without status epilepticus: Secondary | ICD-10-CM | POA: Diagnosis not present

## 2020-03-14 DIAGNOSIS — J449 Chronic obstructive pulmonary disease, unspecified: Secondary | ICD-10-CM

## 2020-03-14 DIAGNOSIS — N1832 Chronic kidney disease, stage 3b: Secondary | ICD-10-CM

## 2020-03-14 DIAGNOSIS — I10 Essential (primary) hypertension: Secondary | ICD-10-CM

## 2020-03-14 DIAGNOSIS — C711 Malignant neoplasm of frontal lobe: Secondary | ICD-10-CM

## 2020-03-14 DIAGNOSIS — K219 Gastro-esophageal reflux disease without esophagitis: Secondary | ICD-10-CM

## 2020-03-14 NOTE — Assessment & Plan Note (Signed)
Stable, continue Atrovent HFA prn

## 2020-03-14 NOTE — Assessment & Plan Note (Signed)
Stable, continue Esomeprazole.  

## 2020-03-14 NOTE — Assessment & Plan Note (Signed)
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  The patient has left upper extremity weakness, left finger numbness, left facial weakness MRI of the brain, MRI consistent with solitary intra-axial hemorrhage versus hemorrhagic mass. CT scan of the chest abdomen pelvis, no radiological evidence of malignancy. Patient was followed by neurology, neurosurgery and palliative care.  Neurosurgery discussed about brain biopsy, radiation therapy however patient opted out of any treatment rather stay comfortable Hospice service at [facility]

## 2020-03-14 NOTE — Assessment & Plan Note (Signed)
Long standing issue, refuses anxiolytics/antidepressants which caused her active seizures in the past per patient.

## 2020-03-14 NOTE — Assessment & Plan Note (Signed)
Stable, continue Lamictal, Zonegran

## 2020-03-14 NOTE — Assessment & Plan Note (Signed)
Left side weakness, left finger numbness

## 2020-03-14 NOTE — Telephone Encounter (Signed)
Roderic Ovens with [facility] called regarding a Hospice referral for Jessica [name] and wanting to know if you will be following her for her care. Please advise.

## 2020-03-14 NOTE — Progress Notes (Signed)
Location:   [place] Room Number: 29 Place of Service:  SNF (31) Provider: Marlana Latus NP    Patient Care Team: Virgie Dad, MD as PCP - General (Internal Medicine) Latisia Hilaire X, NP as Nurse Practitioner (Internal Medicine)  Extended Emergency Contact Information Primary Emergency Contact: Powers,Kristine Address: [address] [place], [place] Phone: [phone] Relation: Daughter Secondary Emergency Contact: Racheal Patches, [place] [employer] Phone: [phone] Relation: Son  Code Status: DNR Goals of care: Advanced Directive information Advanced Directives 03/15/2020  Does Patient Have a Medical Advance Directive? Yes  Type of Advance Directive Out of facility DNR (pink MOST or yellow form);Living will;Healthcare Power of Attorney  Does patient want to make changes to medical advance directive? No - Patient declined  Copy of [place] in Chart? Yes - validated most recent copy scanned in chart (See row information)  Would patient like information on creating a medical advance directive? -  Pre-existing out of facility DNR order (yellow form or pink MOST form) -     Chief Complaint  Patient presents with   Acute Visit    Medication Review    HPI:  Pt is a 86 y.o. female seen today for an acute visit for medication review  The patient was hospitalized 03/07/20-03/13/20 right frontoparietal intracranial hemorrhage consistent with metastatic brain tumor.  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  The patient has left upper extremity weakness, left finger numbness, left facial weakness MRI of the brain, MRI consistent with solitary intra-axial hemorrhage versus hemorrhagic mass. CT scan of the chest abdomen pelvis, no radiological evidence of malignancy. 2D  echocardiogram, normal. Antiplatelet therapy, discontinued ASA. Patient was followed by neurology, neurosurgery and palliative care.  Neurosurgery discussed about brain biopsy, radiation therapy however patient opted out of any treatment rather stay comfortable, stay at skilled nursing facility and enroll into hospice program.  Hx of colon cancer, metastatic melanoma, Afib, COPD, on Atrovent HFA prn 16hr,  seizure, stable, on Lamictal 150mg  qd, 200mg  qd,  zonegran 100mg  bid. HTN, on Amlodipine 10mg  qd, Losartan 100mg  qd,  Metoprolol 50mg  qd.  GERD, stable, on Esomeprazole 40mg  qd.   Past Medical History:  Diagnosis Date   Alcohol abuse 11/11/2010   Alcohol abuse, unspecified    Anxiety    Atrial fibrillation ([place]) 11/11/2010   Bone infection of left hand ([place]) 12/22/2008   secondary to injury   Cancer [facility])    colon cancer-hx of chemo   Chronic airway obstruction, not elsewhere classified 10/01/2011   Confusion    COPD (chronic obstructive pulmonary disease) (HCC)    Dizziness and giddiness 04/24/2005   Edema 12/03/2011   Embolism - blood clot 1996   left breast   Generalized convulsive epilepsy without mention of intractable epilepsy    GERD (gastroesophageal reflux disease)    Hemorrhoids    Hyperlipidemia    Hypertension    Insomnia, unspecified 11/11/2010   Major depressive disorder, single episode, unspecified 11/11/2010   Malignant neoplasm of stomach, unspecified site 11/11/2010   Melanoma [facility])    metastatic   Peripheral vascular disease ([place])    Seizures ([place])    Symptomatic menopausal or female climacteric states 11/11/2010   Tachycardia, unspecified    Tobacco use disorder    Urgency  of urination    Past Surgical History:  Procedure Laterality Date   ABDOMINAL HYSTERECTOMY  1963   BLADDER REPAIR     breast biopsies     x2 on left, x 1 on right   c-sections  1957, 1959, 1961   x3   [place]   COLONOSCOPY  10/21/2012   Dr.  Paulita Fujita diverticulosis, sessile polyps biopsy    heart ablation  [facility] surgery to right eye  10/04/2007   left femoral artery stent  05/07/2004   melanoma removed from left groin  09/2004   melanoma removed from left leg  09/06/2004   orif left humerus fracture  03/27/2005   porta cath placement  1995   porta cath removal  1996   removal of tumor in left knee  11/15/2010   skin graft to left leg  12/05/2004   VOCAL CORD INJECTION  1996, 1997    Allergies  Allergen Reactions   Aspirin Nausea And Vomiting   Effexor [Venlafaxine Hcl]     Seizure   Keflex [Cephalexin]    Levaquin [Levofloxacin]    Venlafaxine Other (See Comments)    seizures   Penicillins Rash    Has patient had a PCN reaction causing immediate rash, facial/tongue/throat swelling, SOB or lightheadedness with hypotension: Yes Has patient had a PCN reaction causing severe rash involving mucus membranes or skin necrosis: No Has patient had a PCN reaction that required hospitalization: No Has patient had a PCN reaction occurring within the last 10 years: No If all of the above answers are "NO", then may proceed with Cephalosporin use.    Zithromax [Azithromycin] Rash    Allergies as of 03/14/2020      Reactions   Aspirin Nausea And Vomiting   Effexor [venlafaxine Hcl]    Seizure   Keflex [cephalexin]    Levaquin [levofloxacin]    Venlafaxine Other (See Comments)   seizures   Penicillins Rash   Has patient had a PCN reaction causing immediate rash, facial/tongue/throat swelling, SOB or lightheadedness with hypotension: Yes Has patient had a PCN reaction causing severe rash involving mucus membranes or skin necrosis: No Has patient had a PCN reaction that required hospitalization: No Has patient had a PCN reaction occurring within the last 10 years: No If all of the above answers are "NO", then may proceed with Cephalosporin use.   Zithromax [azithromycin] Rash        Medication List       Accurate as of March 14, 2020 11:59 PM. If you have any questions, ask your nurse or doctor.        amLODipine 10 MG tablet Commonly known as: NORVASC Take 1 tablet (10 mg total) by mouth daily.   Calcium 600/Vitamin D3 600-800 MG-UNIT Tabs Generic drug: Calcium Carb-Cholecalciferol Take 1 tablet by mouth daily.   dexamethasone 4 MG tablet Commonly known as: DECADRON 1 tabs QID for 3 days 1 tabs TID for 3 days 1 tabs BID for 3 days 1 tab daily for 3 days   esomeprazole 40 MG capsule Commonly known as: NEXIUM Take 40 mg by mouth daily at 12 noon.   ipratropium 17 MCG/ACT inhaler Commonly known as: ATROVENT HFA Inhale 2 puffs into the lungs every 6 (six) hours as needed for wheezing.   lamoTRIgine 150 MG tablet Commonly known as: LAMICTAL Take 1 tablet (150 mg total) by mouth in the morning.   lamoTRIgine 200 MG Tbdp  Take 1 tablet (200 mg total) by mouth at bedtime.   LOSARTAN POTASSIUM PO Take 100 mg by mouth daily.   metoprolol succinate 50 MG 24 hr tablet Commonly known as: TOPROL-XL Take 1 tablet (50 mg total) by mouth daily. Take with or immediately following a meal.   zonisamide 100 MG capsule Commonly known as: ZONEGRAN Take 200 mg by mouth every evening.   zonisamide 100 MG capsule Commonly known as: ZONEGRAN Take 100 mg by mouth in the morning.       Review of Systems  Constitutional: Negative for appetite change, fatigue, fever and unexpected weight change.  HENT: Positive for hearing loss. Negative for congestion and voice change.   Eyes: Negative for visual disturbance.  Respiratory: Positive for cough. Negative for shortness of breath and wheezing.   Cardiovascular: Negative for chest pain, palpitations and leg swelling.  Gastrointestinal: Negative for abdominal pain, constipation, diarrhea, nausea and vomiting.  Genitourinary: Negative for difficulty urinating, dysuria and urgency.  Musculoskeletal: Negative for gait  problem.  Skin: Negative for color change.  Neurological: Positive for facial asymmetry, weakness and numbness. Negative for tremors, seizures, syncope, speech difficulty, light-headedness and headaches.       Memory lapses. Left facial, arm, leg weakness, left hand numbness.   Psychiatric/Behavioral: Negative for behavioral problems and sleep disturbance. The patient is not nervous/anxious.        The patient seems more relaxed     Immunization History  Administered Date(s) Administered   Influenza Whole 06/29/2012, 07/13/2013   Influenza, High Dose Seasonal PF 07/02/2019   Influenza-Unspecified 07/17/2014   Moderna SARS-COVID-2 Vaccination 10/01/2019, 10/29/2019   Pneumococcal Conjugate-13 02/25/2018   Tdap 05/05/2019   Zoster Recombinat (Shingrix) 05/05/2019   Pertinent  Health Maintenance Due  Topic Date Due   FOOT EXAM  Never done   OPHTHALMOLOGY EXAM  Never done   HEMOGLOBIN A1C  12/06/2018   INFLUENZA VACCINE  04/29/2020   DEXA SCAN  Completed   PNA vac Low Risk Adult  Completed   Fall Risk  05/02/2019 10/06/2017 08/28/2017 06/30/2017 04/24/2017  Falls in the past year? 1 Yes No Yes No  Comment Emmi Telephone Survey: data to providers prior to load - - - Emmi Telephone Survey: data to providers prior to load  Number falls in past yr: 1 1 - 1 -  Comment Emmi Telephone Survey Actual Response = 1 - - - -  Injury with Fall? 1 Yes - No -  Follow up - - - Falls evaluation completed;Education provided;Falls prevention discussed -   Functional Status Survey:    Vitals:   03/14/20 1409  BP: (!) 174/90  Pulse: 78  Resp: 20  Temp: 97.8 F (36.6 C)  SpO2: 96%  Weight: 92 lb (41.7 kg)  Height: 5\' 4"  (1.626 m)   Body mass index is 15.79 kg/m. Physical Exam Vitals and nursing note reviewed.  Constitutional:      General: She is not in acute distress.    Appearance: Normal appearance. She is not ill-appearing, toxic-appearing or diaphoretic.  HENT:     Head:  Normocephalic and atraumatic.     Mouth/Throat:     Mouth: Mucous membranes are moist.  Eyes:     Extraocular Movements: Extraocular movements intact.     Conjunctiva/sclera: Conjunctivae normal.     Pupils: Pupils are equal, round, and reactive to light.  Cardiovascular:     Rate and Rhythm: Normal rate and regular rhythm.     Heart sounds: No murmur heard.  Pulmonary:     Breath sounds: No rales.  Abdominal:     General: Bowel sounds are normal.     Palpations: Abdomen is soft.     Tenderness: There is no abdominal tenderness.  Musculoskeletal:     Cervical back: Normal range of motion and neck supple.     Right lower leg: No edema.     Left lower leg: No edema.  Skin:    General: Skin is warm and dry.     Comments: Left thigh melanoma surgical removal scar.   Neurological:     General: No focal deficit present.     Mental Status: She is alert and oriented to person, place, and time. Mental status is at baseline.     Cranial Nerves: Cranial nerve deficit present.     Motor: Weakness present.     Coordination: Coordination abnormal.     Gait: Gait normal.     Comments: Mild left facial weakness, LUE/LLE  Muscle strength 5/5 all limbs, weaker than the right side limbs.   Psychiatric:        Mood and Affect: Mood normal.        Behavior: Behavior normal.        Thought Content: Thought content normal.        Judgment: Judgment normal.     Labs reviewed: Recent Labs    02/28/20 0000 02/28/20 0000 03/07/20 1921 03/07/20 1930 03/11/20 0330  NA 141  --  136 143 141  K 4.4   < > 4.0 4.1 4.0  CL 105   < > 100 105 107  CO2 29*  --  24  --  28  GLUCOSE  --   --  104* 103* 157*  BUN 18  --  22 24* 27*  CREATININE 1.3*  --  1.38* 1.40* 1.22*  CALCIUM 9.8  --  9.6  --  9.3   < > = values in this interval not displayed.   Recent Labs    04/26/19 0000 04/26/19 0000 12/29/19 0000 02/22/20 0000 03/07/20 1921  AST 15   < > 14 15 20   ALT 7   < > 9 10 16   ALKPHOS 76    < > 82 89 89  BILITOT  --   --   --   --  0.4  PROT 6.4  --   --   --  7.1  ALBUMIN 3.8  --  3.4* 4.1 4.0   < > = values in this interval not displayed.   Recent Labs    12/29/19 0000 12/29/19 0000 02/22/20 0000 03/07/20 1921 03/07/20 1930  WBC 7.1  --  6.9 8.3  --   NEUTROABS  --   --  5,285 5.8  --   HGB 13.3   < > 13.5 13.7 14.6  HCT 41   < > 42 44.6 43.0  MCV  --   --   --  92.7  --   PLT 244  --  264 255  --    < > = values in this interval not displayed.   Lab Results  Component Value Date   TSH 2.21 05/17/2019   Lab Results  Component Value Date   HGBA1C 5.7 (H) 06/07/2018   Lab Results  Component Value Date   CHOL 207 (H) 06/07/2018   HDL 68 06/07/2018   LDLCALC 121 (H) 06/07/2018   TRIG 81 06/07/2018   CHOLHDL 3.0 06/07/2018    Significant Diagnostic Results  in last 30 days:  CT CHEST W CONTRAST  Result Date: 03/09/2020 CLINICAL DATA:  Hemorrhagic brain mass. Staging. Per previous radiology reports, history of melanoma and carcinoid tumor. EXAM: CT CHEST, ABDOMEN, AND PELVIS WITH CONTRAST TECHNIQUE: Multidetector CT imaging of the chest, abdomen and pelvis was performed following the standard protocol during bolus administration of intravenous contrast. CONTRAST:  68mL OMNIPAQUE IOHEXOL 300 MG/ML  SOLN COMPARISON:  PET-CT 10/31/2010.  Chest CTA 10/10/2011. FINDINGS: CT CHEST FINDINGS Cardiovascular: Extensive atherosclerosis of the aorta, great vessels and coronary arteries. No acute vascular findings are identified. There are calcifications of the aortic valve. The heart size is normal. There is no pericardial effusion. Mediastinum/Nodes: There are no enlarged mediastinal, hilar or axillary lymph nodes. Small mediastinal and hilar lymph nodes are stable, some calcified. The thyroid gland, esophagus and trachea demonstrate no significant findings. Lungs/Pleura: No pleural effusion or pneumothorax. Moderate centrilobular emphysema with stable biapical scarring and  scattered subpleural reticulation. There is a calcified right upper lobe granuloma. The aeration of the lung bases has improved compared with the prior study. No suspicious pulmonary nodules. Musculoskeletal/Chest wall: No chest wall mass or suspicious osseous findings. Inferior endplate compression deformity at T9 is new compared with prior chest CTA, but does not appear acute. CT ABDOMEN AND PELVIS FINDINGS Hepatobiliary: The liver is normal in density without suspicious focal abnormality. No evidence of gallstones, gallbladder wall thickening or biliary dilatation. Pancreas: Unremarkable. No pancreatic ductal dilatation or surrounding inflammatory changes. Spleen: Normal in size without suspicious focal abnormality. Scattered small calcified granulomas are noted. Adrenals/Urinary Tract: Mild thickening of the left adrenal gland appears similar to previous PET-CT. The right adrenal gland appears normal. Bilateral renal vascular calcifications and probable nephrolithiasis. No evidence of ureteral calculus, hydronephrosis or renal mass. The bladder appears normal. Stomach/Bowel: No evidence of bowel wall thickening, distention or surrounding inflammatory change. Moderate stool throughout the colon. No bowel lesions identified. Vascular/Lymphatic: There are no enlarged abdominal or pelvic lymph nodes. There is extensive aortic and branch vessel atherosclerosis with mild dilatation of the abdominal aorta to 2.3 cm. No focal aneurysm or acute vascular findings. The portal, superior mesenteric and splenic veins are patent. Reproductive: Hysterectomy.  No adnexal mass. Other: No evidence of abdominal wall mass or hernia. No ascites. Musculoskeletal: No acute or significant osseous findings. Interval superior endplate compression deformity at L2 does not appear acute. The T9 and L2 compression deformities are visible on chest radiographs 11/28/2018. IMPRESSION: 1. No evidence of metastatic disease within the chest,  abdomen or pelvis. No primary malignancy identified. 2. Sequela of prior granulomatous disease. 3. Chronic T9 and L2 compression deformities. 4. Aortic Atherosclerosis (ICD10-I70.0) and Emphysema (ICD10-J43.9). Electronically Signed   By: Richardean Sale M.D.   On: 03/09/2020 08:06   MR BRAIN WO CONTRAST  Result Date: 03/07/2020 CLINICAL DATA:  86 year old female with left side deficits, suspected stroke. History of colon, ovarian, and melanoma malignancy reported. This is an outpatient who was scanned after hours, office of Dr. Marcial Pacas was contacted but is closed. EXAM: MRI HEAD WITHOUT CONTRAST TECHNIQUE: Multiplanar, multiecho pulse sequences of the brain and surrounding structures were obtained without intravenous contrast. COMPARISON:  Head CT 11/28/2018.  Brain MRI 05/20/2017. FINDINGS: Brain: Oval intra-axial masslike area of heterogeneous signal measuring about 46 x 35 x 33 mm (AP by transverse by CC) is centered at the right frontal operculum. There are internal areas of mixed hyperintense and hypointense T1/T2 and FLAIR signal. Some of the areas are restricted on diffusion (series  6 image 67) although there is no surrounding regional diffusion restriction. Much of the area is dark on susceptibility weighted imaging consistent with hemorrhage. There is surrounding T2 and FLAIR hyperintensity in a vasogenic edema pattern. There is no intraventricular extension of blood. No definite extra-axial extension of blood. Mass effect on the right lateral ventricle with no significant midline shift at this time (series 11, image 16). Basilar cisterns remain normal. There is no additional area of similar signal abnormality in the brain. And on SWI imaging no additional cerebral blood products are identified. No ventriculomegaly. Patchy, mild to moderate for age other nonspecific bilateral cerebral white matter T2 and FLAIR hyperintensity with no definite cortical encephalomalacia. T2 heterogeneity throughout the  deep gray matter nuclei is chronic and more resembles perivascular spaces but a degree of chronic lacunar infarcts are also possible. Unchanged since 2018 small chronic infarct in the left cerebellum (series 10, image 8). Cervicomedullary junction and pituitary are within normal limits. Vascular: Major intracranial vascular flow voids are stable since 2018. Skull and upper cervical spine: Negative for age visible cervical spine. Visualized bone marrow signal is within normal limits. Sinuses/Orbits: Postoperative changes to both globes, otherwise negative orbits. Paranasal Visualized paranasal sinuses and mastoids are stable and well pneumatized. Other: Visible internal auditory structures appear normal. Scalp and face soft tissues appear negative. IMPRESSION: 1. Solitary intra-axial hemorrhage versus hemorrhagic mass at the right operculum measuring up to 4.6 cm (volume 27 mL). Regional vasogenic edema and mild mass effect with no midline shift at this time. No extra-axial or intraventricular extension of blood. There is no larger surrounding area of ischemia to strongly suggest a hemorrhagic infarct, and this is indeterminate for a small vessel versus tumor related bleed. Post-contrast MRI images would be valuable, although a repeat MRI without and with contrast once the hematoma has resolved would be most specific. 2. Evidence of chronic small vessel disease otherwise stable since the 2018 MRI. I recommended this patient be seen in an Emergency Department, and her family was agreeable to transport her by private vehicle from [place] to the nearby [facility] ED. In anticipation of the patient's arrival, salient findings were discussed by telephone with the [facility] ED Charge Nurse on 03/07/2020 at 18:33 . Electronically Signed   By: Genevie Ann M.D.   On: 03/07/2020 18:42   MR BRAIN W CONTRAST  Result Date: 03/08/2020 CLINICAL DATA:  Intracranial mass EXAM: MRI HEAD WITH CONTRAST TECHNIQUE: Multiplanar, multiecho  pulse sequences of the brain and surrounding structures were obtained with intravenous contrast. CONTRAST:  4.32mL GADAVIST GADOBUTROL 1 MMOL/ML IV SOLN COMPARISON:  Brain MRI without contrast 03/07/2020 FINDINGS: Unchanged size of hemorrhage centered at the right frontoparietal junction. There is an area of nodular contrast enhancement within the posterior aspect of the hemorrhage that measures 1.6 x 1.3 cm (series 7, image 37). No other site of abnormal contrast enhancement. The degree of surrounding edema is unchanged. No midline shift. IMPRESSION: 1. Unchanged size of hemorrhage centered at the right frontoparietal junction. There is an area of nodular contrast enhancement within the posterior aspect of the hemorrhage, likely indicating the presence of tumor. Follow-up study when the hematoma has had time to resolve is suggested. 2. No other contrast-enhancing lesion. Electronically Signed   By: Ulyses Jarred M.D.   On: 03/08/2020 03:34   CT ABDOMEN PELVIS W CONTRAST  Result Date: 03/09/2020 CLINICAL DATA:  Hemorrhagic brain mass. Staging. Per previous radiology reports, history of melanoma and carcinoid tumor. EXAM: CT CHEST, ABDOMEN,  AND PELVIS WITH CONTRAST TECHNIQUE: Multidetector CT imaging of the chest, abdomen and pelvis was performed following the standard protocol during bolus administration of intravenous contrast. CONTRAST:  69mL OMNIPAQUE IOHEXOL 300 MG/ML  SOLN COMPARISON:  PET-CT 10/31/2010.  Chest CTA 10/10/2011. FINDINGS: CT CHEST FINDINGS Cardiovascular: Extensive atherosclerosis of the aorta, great vessels and coronary arteries. No acute vascular findings are identified. There are calcifications of the aortic valve. The heart size is normal. There is no pericardial effusion. Mediastinum/Nodes: There are no enlarged mediastinal, hilar or axillary lymph nodes. Small mediastinal and hilar lymph nodes are stable, some calcified. The thyroid gland, esophagus and trachea demonstrate no significant  findings. Lungs/Pleura: No pleural effusion or pneumothorax. Moderate centrilobular emphysema with stable biapical scarring and scattered subpleural reticulation. There is a calcified right upper lobe granuloma. The aeration of the lung bases has improved compared with the prior study. No suspicious pulmonary nodules. Musculoskeletal/Chest wall: No chest wall mass or suspicious osseous findings. Inferior endplate compression deformity at T9 is new compared with prior chest CTA, but does not appear acute. CT ABDOMEN AND PELVIS FINDINGS Hepatobiliary: The liver is normal in density without suspicious focal abnormality. No evidence of gallstones, gallbladder wall thickening or biliary dilatation. Pancreas: Unremarkable. No pancreatic ductal dilatation or surrounding inflammatory changes. Spleen: Normal in size without suspicious focal abnormality. Scattered small calcified granulomas are noted. Adrenals/Urinary Tract: Mild thickening of the left adrenal gland appears similar to previous PET-CT. The right adrenal gland appears normal. Bilateral renal vascular calcifications and probable nephrolithiasis. No evidence of ureteral calculus, hydronephrosis or renal mass. The bladder appears normal. Stomach/Bowel: No evidence of bowel wall thickening, distention or surrounding inflammatory change. Moderate stool throughout the colon. No bowel lesions identified. Vascular/Lymphatic: There are no enlarged abdominal or pelvic lymph nodes. There is extensive aortic and branch vessel atherosclerosis with mild dilatation of the abdominal aorta to 2.3 cm. No focal aneurysm or acute vascular findings. The portal, superior mesenteric and splenic veins are patent. Reproductive: Hysterectomy.  No adnexal mass. Other: No evidence of abdominal wall mass or hernia. No ascites. Musculoskeletal: No acute or significant osseous findings. Interval superior endplate compression deformity at L2 does not appear acute. The T9 and L2 compression  deformities are visible on chest radiographs 11/28/2018. IMPRESSION: 1. No evidence of metastatic disease within the chest, abdomen or pelvis. No primary malignancy identified. 2. Sequela of prior granulomatous disease. 3. Chronic T9 and L2 compression deformities. 4. Aortic Atherosclerosis (ICD10-I70.0) and Emphysema (ICD10-J43.9). Electronically Signed   By: Richardean Sale M.D.   On: 03/09/2020 08:06   VAS US CAROTID  Result Date: 03/06/2020 Carotid Arterial Duplex Study Indications:       Numbness. Risk Factors:      Hypertension, hyperlipidemia, PAD. Comparison Study:  No prior study Performing Technologist: Maudry Mayhew MHA, RDMS, RVT, RDCS  Examination Guidelines: A complete evaluation includes B-mode imaging, spectral Doppler, color Doppler, and power Doppler as needed of all accessible portions of each vessel. Bilateral testing is considered an integral part of a complete examination. Limited examinations for reoccurring indications may be performed as noted.  Right Carotid Findings: +----------+--------+-------+--------+--------------------------------+--------+             PSV cm/s EDV     Stenosis Plaque Description               Comments                       cm/s                                                        +----------+--------+-------+--------+--------------------------------+--------+  CCA Prox   78       9                                                           +----------+--------+-------+--------+--------------------------------+--------+  CCA Distal 61       12               smooth, heterogenous and                                                         calcific                                   +----------+--------+-------+--------+--------------------------------+--------+  ICA Prox   59       11               smooth, heterogenous and                                                         calcific                                    +----------+--------+-------+--------+--------------------------------+--------+  ICA Distal 76       17                                                          +----------+--------+-------+--------+--------------------------------+--------+  ECA        166      14               heterogenous                               +----------+--------+-------+--------+--------------------------------+--------+ +----------+--------+-------+----------------+-------------------+             PSV cm/s EDV cms Describe         Arm Pressure (mmHG)  +----------+--------+-------+----------------+-------------------+  Subclavian 99               Multiphasic, WNL                      +----------+--------+-------+----------------+-------------------+ +---------+--------+--+--------+--+---------+  Vertebral PSV cm/s 77 EDV cm/s 16 Antegrade  +---------+--------+--+--------+--+---------+  Left Carotid Findings: +----------+-------+-------+--------+---------------------------------+--------+             PSV     EDV     Stenosis Plaque Description                Comments              cm/s    cm/s                                                         +----------+-------+-------+--------+---------------------------------+--------+  CCA Prox   80      12                                                           +----------+-------+-------+--------+---------------------------------+--------+  CCA Distal 51      13               smooth and heterogenous                     +----------+-------+-------+--------+---------------------------------+--------+  ICA Prox   55      12               heterogenous, irregular and                                                      calcific                                    +----------+-------+-------+--------+---------------------------------+--------+  ICA Distal 117     32                                                            +----------+-------+-------+--------+---------------------------------+--------+  ECA        63      3                                                            +----------+-------+-------+--------+---------------------------------+--------+ +----------+--------+--------+----------------+-------------------+             PSV cm/s EDV cm/s Describe         Arm Pressure (mmHG)  +----------+--------+--------+----------------+-------------------+  Subclavian 65                Multiphasic, WNL                      +----------+--------+--------+----------------+-------------------+ +---------+--------+--+--------+-+---------+  Vertebral PSV cm/s 40 EDV cm/s 7 Antegrade  +---------+--------+--+--------+-+---------+   Summary: Right Carotid: Velocities in the right ICA are consistent with a 1-39% stenosis. Left Carotid: Velocities in the left ICA are consistent with a 1-39% stenosis. Vertebrals:  Bilateral vertebral arteries demonstrate antegrade flow. Subclavians: Normal flow hemodynamics were seen in bilateral subclavian              arteries. *See table(s) above for measurements and observations.  Electronically signed by Deitra Mayo MD on 03/06/2020 at 4:05:55 PM.    Final     Assessment/Plan: Hypertension Blood pressure is not well controlled, the patient denied change of vision, headache, nausea, voting, left sided weakness remains no change,  continue Amlodipine, Losartan, Metoprolol for now.   COPD (chronic obstructive pulmonary disease) (HCC) Stable, continue Atrovent HFA prn  GERD (gastroesophageal  reflux disease) Stable, continue Esomeprazole.   Generalized convulsive epilepsy ([place]) Stable, continue Lamictal, Zonegran   Malignant brain tumor ([place]) right frontoparietal intracranial hemorrhage consistent with metastatic brain tumor.  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  The patient has left upper  extremity weakness, left finger numbness, left facial weakness MRI of the brain, MRI consistent with solitary intra-axial hemorrhage versus hemorrhagic mass. CT scan of the chest abdomen pelvis, no radiological evidence of malignancy. Patient was followed by neurology, neurosurgery and palliative care.  Neurosurgery discussed about brain biopsy, radiation therapy however patient opted out of any treatment rather stay comfortable Hospice service at [facility]   Depression with anxiety Long standing issue, refuses anxiolytics/antidepressants which caused her active seizures in the past per patient.   Left sided numbness Left side weakness, left finger numbness  CKD (chronic kidney disease) stage 3, GFR 30-59 ml/min Creat 1.22 03/11/20    Family/ staff Communication: plan of care reviewed with the patient and charge nurse.   Labs/tests ordered:  None  Time spend 35 minutes.

## 2020-03-14 NOTE — Assessment & Plan Note (Addendum)
Blood pressure is not well controlled, the patient denied change of vision, headache, nausea, voting, left sided weakness remains no change,  continue Amlodipine, Losartan, Metoprolol for now.

## 2020-03-14 NOTE — Telephone Encounter (Signed)
Yes [facility] follows with Hospice. She will stay under my Care

## 2020-03-14 NOTE — Telephone Encounter (Signed)
Spoke with Roderic Ovens and told her that Dr. Lyndel Safe will be following patient.

## 2020-03-14 NOTE — Assessment & Plan Note (Signed)
Creat 1.22 03/11/20

## 2020-03-15 ENCOUNTER — Non-Acute Institutional Stay (SKILLED_NURSING_FACILITY): Payer: Medicare Other | Admitting: Internal Medicine

## 2020-03-15 ENCOUNTER — Encounter: Payer: Self-pay | Admitting: Internal Medicine

## 2020-03-15 DIAGNOSIS — G40309 Generalized idiopathic epilepsy and epileptic syndromes, not intractable, without status epilepticus: Secondary | ICD-10-CM | POA: Diagnosis not present

## 2020-03-15 DIAGNOSIS — I10 Essential (primary) hypertension: Secondary | ICD-10-CM

## 2020-03-15 DIAGNOSIS — C711 Malignant neoplasm of frontal lobe: Secondary | ICD-10-CM

## 2020-03-15 DIAGNOSIS — J449 Chronic obstructive pulmonary disease, unspecified: Secondary | ICD-10-CM

## 2020-03-15 NOTE — Progress Notes (Signed)
Provider:  Veleta Miners, MD Location:   [place] Room Number: 13 Place of Service:  SNF (31)  PCP: Virgie Dad, MD Patient Care Team: Virgie Dad, MD as PCP - General (Internal Medicine) Mast, Man X, NP as Nurse Practitioner (Internal Medicine)  Extended Emergency Contact Information Primary Emergency Contact: Powers,Kristine Address: [address], [place] Phone: [phone] Relation: Daughter Secondary Emergency Contact: Racheal Patches, [place] [employer] Phone: [phone] Relation: Son  Code Status: DNR Goals of Care: Advanced Directive information Advanced Directives 03/15/2020  Does Patient Have a Medical Advance Directive? Yes  Type of Advance Directive Out of facility DNR (pink MOST or yellow form);Living will;Healthcare Power of Attorney  Does patient want to make changes to medical advance directive? No - Patient declined  Copy of [place] in Chart? Yes - validated most recent copy scanned in chart (See row information)  Would patient like information on creating a medical advance directive? -  Pre-existing out of facility DNR order (yellow form or pink MOST form) -      Chief Complaint  Patient presents with  . New Admit To SNF    New Admission     HPI: Patient is a 86 y.o. female seen today for admission to SNF for Hospice care  Patient was admitted in the hospital from 6/9-6/15 for right frontoparietal intracranial hemorrhage consistent with metastatic brain tumor possibility due to melanoma.  Patient has a history of colon cancer, metastatic melanoma, atrial fibrillation, hypertension, COPD, seizures on antiepileptics and smoking.  Was getting work-up for her left-sided weakness drooling and numbness.  Neurology ordered MRI which showed right frontoparietal hemorrhagic mass Has CT scan of chest abdomen and  pelvis were negative for any malignancy.  Her 2D echo was normal.  Neurosurgery was consulted.  They wanted further work-up with brain biopsy and radiation therapy.  But patient refused.  She said that she wanted to be comfortable.  She was transferred back to friend home Patient is now enrolled in hospice She continues to drool from her left side.  Having difficulty talking.  Is walking with the cane cannot use the walker due to left hand weakness.   She has started smoking again and facility is working with her to give her safe place to smoke.  Today her main complaint was diarrhea.  She wanted to know if she can use Imodium as needed.  No pain on cramps in abdomen Denies any problem with pain. Is complaining of cough.  But no shortness of breath or chest pain or fever  Past Medical History:  Diagnosis Date  . Alcohol abuse 11/11/2010  . Alcohol abuse, unspecified   . Anxiety   . Atrial fibrillation ([place]) 11/11/2010  . Bone infection of left hand ([place]) 12/22/2008   secondary to injury  . Cancer ([place])    colon cancer-hx of chemo  . Chronic airway obstruction, not elsewhere classified 10/01/2011  . Confusion   . COPD (chronic obstructive pulmonary disease) ([place])   . Dizziness and giddiness 04/24/2005  . Edema 12/03/2011  . Embolism - blood clot 1996   left breast  . Generalized convulsive epilepsy without mention of intractable epilepsy   . GERD (gastroesophageal reflux disease)   . Hemorrhoids   . Hyperlipidemia   . Hypertension   . Insomnia, unspecified  11/11/2010  . Major depressive disorder, single episode, unspecified 11/11/2010  . Malignant neoplasm of stomach, unspecified site 11/11/2010  . Melanoma ([place])    metastatic  . Peripheral vascular disease ([place])   . Seizures ([place])   . Symptomatic menopausal or female climacteric states 11/11/2010  . Tachycardia, unspecified   . Tobacco use disorder   . Urgency of urination    Past Surgical History:  Procedure Laterality Date  .  ABDOMINAL HYSTERECTOMY  1963  . BLADDER REPAIR    . breast biopsies     x2 on left, x 1 on right  . c-sections  1957, 1959, 1961   x3  . COLON RESECTION  1995  . COLONOSCOPY  10/21/2012   Dr. Paulita Fujita diverticulosis, sessile polyps biopsy   . heart ablation  1997  . HEMORRHOID SURGERY    . laser surgery to right eye  10/04/2007  . left femoral artery stent  05/07/2004  . melanoma removed from left groin  09/2004  . melanoma removed from left leg  09/06/2004  . orif left humerus fracture  03/27/2005  . porta cath placement  1995  . porta cath removal  1996  . removal of tumor in left knee  11/15/2010  . skin graft to left leg  12/05/2004  . [place]    reports that she has been smoking cigarettes. She has smoked for the past 60.00 years. She has never used smokeless tobacco. She reports that she does not drink alcohol and does not use drugs. Social History   Socioeconomic History  . Marital status: Widowed    Spouse name: Gwyndolyn Saxon  . Number of children: 3  . Years of education: college  . Highest education level: Not on file  Occupational History  . Occupation: Arboriculturist: RETIRED    Comment: retired  Tobacco Use  . Smoking status: Current Every Day Smoker    Years: 60.00    Types: Cigarettes  . Smokeless tobacco: Never Used  . Tobacco comment: 1 pack last 3 days-10/29/17  Vaping Use  . Vaping Use: Some days  Substance and Sexual Activity  . Alcohol use: No    Comment: quit 02-02-2010  . Drug use: No  . Sexual activity: Never  Other Topics Concern  . Not on file  Social History Narrative   Patient is retired. Patient lives at [facility] [address] alone.    Education- college education.   Right handed.   Caffeine- some. Two cups daily.   Widowed    Exercise no   Alcohol none   Smokes about half pack daily    Social Determinants of Health   Financial Resource Strain:   . Difficulty of Paying Living Expenses:   Food Insecurity:   .  Worried About Charity fundraiser in the Last Year:   . Arboriculturist in the Last Year:   Transportation Needs:   . Film/video editor (Medical):   Marland Kitchen Lack of Transportation (Non-Medical):   Physical Activity:   . Days of Exercise per Week:   . Minutes of Exercise per Session:   Stress:   . Feeling of Stress :   Social Connections:   . Frequency of Communication with Friends and Family:   . Frequency of Social Gatherings with Friends and Family:   . Attends Religious Services:   . Active Member of Clubs or Organizations:   . Attends Archivist Meetings:   Marland Kitchen Marital  Status:   Intimate Partner Violence:   . Fear of Current or Ex-Partner:   . Emotionally Abused:   Marland Kitchen Physically Abused:   . Sexually Abused:     Functional Status Survey:    Family History  Problem Relation Age of Onset  . Cancer Mother   . Breast cancer Sister   . Cancer Brother     Health Maintenance  Topic Date Due  . FOOT EXAM  Never done  . OPHTHALMOLOGY EXAM  Never done  . HEMOGLOBIN A1C  12/06/2018  . INFLUENZA VACCINE  04/29/2020  . TETANUS/TDAP  05/05/2029  . DEXA SCAN  Completed  . COVID-19 Vaccine  Completed  . PNA vac Low Risk Adult  Completed    Allergies  Allergen Reactions  . Aspirin Nausea And Vomiting  . Effexor [Venlafaxine Hcl]     Seizure  . Keflex [Cephalexin]   . Levaquin [Levofloxacin]   . Venlafaxine Other (See Comments)    seizures  . Penicillins Rash    Has patient had a PCN reaction causing immediate rash, facial/tongue/throat swelling, SOB or lightheadedness with hypotension: Yes Has patient had a PCN reaction causing severe rash involving mucus membranes or skin necrosis: No Has patient had a PCN reaction that required hospitalization: No Has patient had a PCN reaction occurring within the last 10 years: No If all of the above answers are "NO", then may proceed with Cephalosporin use.   . Zithromax [Azithromycin] Rash    Allergies as of 03/15/2020        Reactions   Aspirin Nausea And Vomiting   Effexor [venlafaxine Hcl]    Seizure   Keflex [cephalexin]    Levaquin [levofloxacin]    Venlafaxine Other (See Comments)   seizures   Penicillins Rash   Has patient had a PCN reaction causing immediate rash, facial/tongue/throat swelling, SOB or lightheadedness with hypotension: Yes Has patient had a PCN reaction causing severe rash involving mucus membranes or skin necrosis: No Has patient had a PCN reaction that required hospitalization: No Has patient had a PCN reaction occurring within the last 10 years: No If all of the above answers are "NO", then may proceed with Cephalosporin use.   Zithromax [azithromycin] Rash      Medication List       Accurate as of March 15, 2020 10:02 AM. If you have any questions, ask your nurse or doctor.        amLODipine 10 MG tablet Commonly known as: NORVASC Take 1 tablet (10 mg total) by mouth daily.   Calcium 600/Vitamin D3 600-800 MG-UNIT Tabs Generic drug: Calcium Carb-Cholecalciferol Take 1 tablet by mouth daily.   dexamethasone 4 MG tablet Commonly known as: DECADRON 1 tabs QID for 3 days 1 tabs TID for 3 days 1 tabs BID for 3 days 1 tab daily for 3 days   esomeprazole 40 MG capsule Commonly known as: NEXIUM Take 40 mg by mouth daily at 12 noon.   ipratropium 17 MCG/ACT inhaler Commonly known as: ATROVENT HFA Inhale 2 puffs into the lungs every 6 (six) hours as needed for wheezing.   lamoTRIgine 150 MG tablet Commonly known as: LAMICTAL Take 1 tablet (150 mg total) by mouth in the morning.   lamoTRIgine 200 MG Tbdp Take 1 tablet (200 mg total) by mouth at bedtime.   LOSARTAN POTASSIUM PO Take 100 mg by mouth daily.   metoprolol succinate 50 MG 24 hr tablet Commonly known as: TOPROL-XL Take 1 tablet (50 mg total) by mouth  daily. Take with or immediately following a meal.   zonisamide 100 MG capsule Commonly known as: ZONEGRAN Take 200 mg by mouth every evening.    zonisamide 100 MG capsule Commonly known as: ZONEGRAN Take 100 mg by mouth in the morning.       Review of Systems  Constitutional: Positive for activity change and appetite change.  HENT: Positive for voice change.   Respiratory: Positive for cough and shortness of breath.   Cardiovascular: Negative.   Gastrointestinal: Positive for diarrhea.  Genitourinary: Negative.   Musculoskeletal: Positive for gait problem.  Skin: Negative.   Neurological: Positive for weakness.  Psychiatric/Behavioral: Negative.   All other systems reviewed and are negative.   Vitals:   03/15/20 0957  BP: 120/60  Pulse: 90  Resp: 16  Temp: 97.6 F (36.4 C)  SpO2: 97%  Weight: 92 lb 3.2 oz (41.8 kg)  Height: _0  (1.626 m)   Body mass index is 15.83 kg/m. Physical Exam Vitals reviewed.  Constitutional:      Comments: Very thin and frail  HENT:     Head: Normocephalic.     Nose: Nose normal.     Mouth/Throat:     Mouth: Mucous membranes are moist.     Pharynx: Oropharynx is clear.     Comments: Continues to drool from one side Eyes:     Pupils: Pupils are equal, round, and reactive to light.  Cardiovascular:     Rate and Rhythm: Normal rate and regular rhythm.     Pulses: Normal pulses.  Pulmonary:     Effort: Pulmonary effort is normal. No respiratory distress.     Breath sounds: Normal breath sounds. No wheezing or rales.  Abdominal:     General: Abdomen is flat. Bowel sounds are normal.     Palpations: Abdomen is soft.  Musculoskeletal:        General: No swelling.     Cervical back: Neck supple.  Skin:    General: Skin is warm.  Neurological:     Mental Status: She is alert and oriented to person, place, and time.     Comments: Left upper extremity weakness  Psychiatric:        Mood and Affect: Mood normal.     Labs reviewed: Basic Metabolic Panel: Recent Labs    02/28/20 0000 02/28/20 0000 03/07/20 1921 03/07/20 1930 03/11/20 0330  NA 141  --  136 143 141  K  4.4   < > 4.0 4.1 4.0  CL 105   < > 100 105 107  CO2 29*  --  24  --  28  GLUCOSE  --   --  104* 103* 157*  BUN 18  --  22 24* 27*  CREATININE 1.3*  --  1.38* 1.40* 1.22*  CALCIUM 9.8  --  9.6  --  9.3   < > = values in this interval not displayed.   Liver Function Tests: Recent Labs    04/26/19 0000 04/26/19 0000 12/29/19 0000 02/22/20 0000 03/07/20 1921  AST 15   < > _1 ALT 7   < > _2 ALKPHOS 76   < > 82 89 89  BILITOT  --   --   --   --  0.4  PROT 6.4  --   --   --  7.1  ALBUMIN 3.8  --  3.4* 4.1 4.0   < > = values in this interval not displayed.  No results for input(s): LIPASE, AMYLASE in the last 8760 hours. No results for input(s): AMMONIA in the last 8760 hours. CBC: Recent Labs    12/29/19 0000 12/29/19 0000 02/22/20 0000 03/07/20 1921 03/07/20 1930  WBC 7.1  --  6.9 8.3  --   NEUTROABS  --   --  5,285 5.8  --   HGB 13.3   < > 13.5 13.7 14.6  HCT 41   < > 42 44.6 43.0  MCV  --   --   --  92.7  --   PLT 244  --  264 255  --    < > = values in this interval not displayed.   Cardiac Enzymes: No results for input(s): CKTOTAL, CKMB, CKMBINDEX, TROPONINI in the last 8760 hours. BNP: Invalid input(s): POCBNP Lab Results  Component Value Date   HGBA1C 5.7 (H) 06/07/2018   Lab Results  Component Value Date   TSH 2.21 05/17/2019   Lab Results  Component Value Date   VITAMINB12 238 05/20/2017   Lab Results  Component Value Date   FOLATE 14.3 10/03/2011   No results found for: IRON, TIBC, FERRITIN  Imaging and Procedures obtained prior to SNF admission: CT CHEST W CONTRAST  Result Date: 03/09/2020 CLINICAL DATA:  Hemorrhagic brain mass. Staging. Per previous radiology reports, history of melanoma and carcinoid tumor. EXAM: CT CHEST, ABDOMEN, AND PELVIS WITH CONTRAST TECHNIQUE: Multidetector CT imaging of the chest, abdomen and pelvis was performed following the standard protocol during bolus administration of intravenous contrast.  CONTRAST:  79m OMNIPAQUE IOHEXOL 300 MG/ML  SOLN COMPARISON:  PET-CT 10/31/2010.  Chest CTA 10/10/2011. FINDINGS: CT CHEST FINDINGS Cardiovascular: Extensive atherosclerosis of the aorta, great vessels and coronary arteries. No acute vascular findings are identified. There are calcifications of the aortic valve. The heart size is normal. There is no pericardial effusion. Mediastinum/Nodes: There are no enlarged mediastinal, hilar or axillary lymph nodes. Small mediastinal and hilar lymph nodes are stable, some calcified. The thyroid gland, esophagus and trachea demonstrate no significant findings. Lungs/Pleura: No pleural effusion or pneumothorax. Moderate centrilobular emphysema with stable biapical scarring and scattered subpleural reticulation. There is a calcified right upper lobe granuloma. The aeration of the lung bases has improved compared with the prior study. No suspicious pulmonary nodules. Musculoskeletal/Chest wall: No chest wall mass or suspicious osseous findings. Inferior endplate compression deformity at T9 is new compared with prior chest CTA, but does not appear acute. CT ABDOMEN AND PELVIS FINDINGS Hepatobiliary: The liver is normal in density without suspicious focal abnormality. No evidence of gallstones, gallbladder wall thickening or biliary dilatation. Pancreas: Unremarkable. No pancreatic ductal dilatation or surrounding inflammatory changes. Spleen: Normal in size without suspicious focal abnormality. Scattered small calcified granulomas are noted. Adrenals/Urinary Tract: Mild thickening of the left adrenal gland appears similar to previous PET-CT. The right adrenal gland appears normal. Bilateral renal vascular calcifications and probable nephrolithiasis. No evidence of ureteral calculus, hydronephrosis or renal mass. The bladder appears normal. Stomach/Bowel: No evidence of bowel wall thickening, distention or surrounding inflammatory change. Moderate stool throughout the colon. No  bowel lesions identified. Vascular/Lymphatic: There are no enlarged abdominal or pelvic lymph nodes. There is extensive aortic and branch vessel atherosclerosis with mild dilatation of the abdominal aorta to 2.3 cm. No focal aneurysm or acute vascular findings. The portal, superior mesenteric and splenic veins are patent. Reproductive: Hysterectomy.  No adnexal mass. Other: No evidence of abdominal wall mass or hernia. No ascites. Musculoskeletal: No acute or significant osseous findings.  Interval superior endplate compression deformity at L2 does not appear acute. The T9 and L2 compression deformities are visible on chest radiographs 11/28/2018. IMPRESSION: 1. No evidence of metastatic disease within the chest, abdomen or pelvis. No primary malignancy identified. 2. Sequela of prior granulomatous disease. 3. Chronic T9 and L2 compression deformities. 4. Aortic Atherosclerosis (ICD10-I70.0) and Emphysema (ICD10-J43.9). Electronically Signed   By: Richardean Sale M.D.   On: 03/09/2020 08:06   MR BRAIN WO CONTRAST  Result Date: 03/07/2020 CLINICAL DATA:  86 year old female with left side deficits, suspected stroke. History of colon, ovarian, and melanoma malignancy reported. This is an outpatient who was scanned after hours, office of Dr. Marcial Pacas was contacted but is closed. EXAM: MRI HEAD WITHOUT CONTRAST TECHNIQUE: Multiplanar, multiecho pulse sequences of the brain and surrounding structures were obtained without intravenous contrast. COMPARISON:  Head CT 11/28/2018.  Brain MRI 05/20/2017. FINDINGS: Brain: Oval intra-axial masslike area of heterogeneous signal measuring about 46 x 35 x 33 mm (AP by transverse by CC) is centered at the right frontal operculum. There are internal areas of mixed hyperintense and hypointense T1/T2 and FLAIR signal. Some of the areas are restricted on diffusion (series 6 image 67) although there is no surrounding regional diffusion restriction. Much of the area is dark on  susceptibility weighted imaging consistent with hemorrhage. There is surrounding T2 and FLAIR hyperintensity in a vasogenic edema pattern. There is no intraventricular extension of blood. No definite extra-axial extension of blood. Mass effect on the right lateral ventricle with no significant midline shift at this time (series 11, image 16). Basilar cisterns remain normal. There is no additional area of similar signal abnormality in the brain. And on SWI imaging no additional cerebral blood products are identified. No ventriculomegaly. Patchy, mild to moderate for age other nonspecific bilateral cerebral white matter T2 and FLAIR hyperintensity with no definite cortical encephalomalacia. T2 heterogeneity throughout the deep gray matter nuclei is chronic and more resembles perivascular spaces but a degree of chronic lacunar infarcts are also possible. Unchanged since 2018 small chronic infarct in the left cerebellum (series 10, image 8). Cervicomedullary junction and pituitary are within normal limits. Vascular: Major intracranial vascular flow voids are stable since 2018. Skull and upper cervical spine: Negative for age visible cervical spine. Visualized bone marrow signal is within normal limits. Sinuses/Orbits: Postoperative changes to both globes, otherwise negative orbits. Paranasal Visualized paranasal sinuses and mastoids are stable and well pneumatized. Other: Visible internal auditory structures appear normal. Scalp and face soft tissues appear negative. IMPRESSION: 1. Solitary intra-axial hemorrhage versus hemorrhagic mass at the right operculum measuring up to 4.6 cm (volume 27 mL). Regional vasogenic edema and mild mass effect with no midline shift at this time. No extra-axial or intraventricular extension of blood. There is no larger surrounding area of ischemia to strongly suggest a hemorrhagic infarct, and this is indeterminate for a small vessel versus tumor related bleed. Post-contrast MRI images  would be valuable, although a repeat MRI without and with contrast once the hematoma has resolved would be most specific. 2. Evidence of chronic small vessel disease otherwise stable since the 2018 MRI. I recommended this patient be seen in an Emergency Department, and her family was agreeable to transport her by private vehicle from [place] to the nearby [facility] ED. In anticipation of the patient's arrival, salient findings were discussed by telephone with the [facility] ED Charge Nurse on 03/07/2020 at 18:33 . Electronically Signed   By: Genevie Ann M.D.   On:  03/07/2020 18:42   MR BRAIN W CONTRAST  Result Date: 03/08/2020 CLINICAL DATA:  Intracranial mass EXAM: MRI HEAD WITH CONTRAST TECHNIQUE: Multiplanar, multiecho pulse sequences of the brain and surrounding structures were obtained with intravenous contrast. CONTRAST:  4.57m GADAVIST GADOBUTROL 1 MMOL/ML IV SOLN COMPARISON:  Brain MRI without contrast 03/07/2020 FINDINGS: Unchanged size of hemorrhage centered at the right frontoparietal junction. There is an area of nodular contrast enhancement within the posterior aspect of the hemorrhage that measures 1.6 x 1.3 cm (series 7, image 37). No other site of abnormal contrast enhancement. The degree of surrounding edema is unchanged. No midline shift. IMPRESSION: 1. Unchanged size of hemorrhage centered at the right frontoparietal junction. There is an area of nodular contrast enhancement within the posterior aspect of the hemorrhage, likely indicating the presence of tumor. Follow-up study when the hematoma has had time to resolve is suggested. 2. No other contrast-enhancing lesion. Electronically Signed   By: KUlyses JarredM.D.   On: 03/08/2020 03:34   CT ABDOMEN PELVIS W CONTRAST  Result Date: 03/09/2020 CLINICAL DATA:  Hemorrhagic brain mass. Staging. Per previous radiology reports, history of melanoma and carcinoid tumor. EXAM: CT CHEST, ABDOMEN, AND PELVIS WITH CONTRAST TECHNIQUE: Multidetector CT imaging  of the chest, abdomen and pelvis was performed following the standard protocol during bolus administration of intravenous contrast. CONTRAST:  832mOMNIPAQUE IOHEXOL 300 MG/ML  SOLN COMPARISON:  PET-CT 10/31/2010.  Chest CTA 10/10/2011. FINDINGS: CT CHEST FINDINGS Cardiovascular: Extensive atherosclerosis of the aorta, great vessels and coronary arteries. No acute vascular findings are identified. There are calcifications of the aortic valve. The heart size is normal. There is no pericardial effusion. Mediastinum/Nodes: There are no enlarged mediastinal, hilar or axillary lymph nodes. Small mediastinal and hilar lymph nodes are stable, some calcified. The thyroid gland, esophagus and trachea demonstrate no significant findings. Lungs/Pleura: No pleural effusion or pneumothorax. Moderate centrilobular emphysema with stable biapical scarring and scattered subpleural reticulation. There is a calcified right upper lobe granuloma. The aeration of the lung bases has improved compared with the prior study. No suspicious pulmonary nodules. Musculoskeletal/Chest wall: No chest wall mass or suspicious osseous findings. Inferior endplate compression deformity at T9 is new compared with prior chest CTA, but does not appear acute. CT ABDOMEN AND PELVIS FINDINGS Hepatobiliary: The liver is normal in density without suspicious focal abnormality. No evidence of gallstones, gallbladder wall thickening or biliary dilatation. Pancreas: Unremarkable. No pancreatic ductal dilatation or surrounding inflammatory changes. Spleen: Normal in size without suspicious focal abnormality. Scattered small calcified granulomas are noted. Adrenals/Urinary Tract: Mild thickening of the left adrenal gland appears similar to previous PET-CT. The right adrenal gland appears normal. Bilateral renal vascular calcifications and probable nephrolithiasis. No evidence of ureteral calculus, hydronephrosis or renal mass. The bladder appears normal.  Stomach/Bowel: No evidence of bowel wall thickening, distention or surrounding inflammatory change. Moderate stool throughout the colon. No bowel lesions identified. Vascular/Lymphatic: There are no enlarged abdominal or pelvic lymph nodes. There is extensive aortic and branch vessel atherosclerosis with mild dilatation of the abdominal aorta to 2.3 cm. No focal aneurysm or acute vascular findings. The portal, superior mesenteric and splenic veins are patent. Reproductive: Hysterectomy.  No adnexal mass. Other: No evidence of abdominal wall mass or hernia. No ascites. Musculoskeletal: No acute or significant osseous findings. Interval superior endplate compression deformity at L2 does not appear acute. The T9 and L2 compression deformities are visible on chest radiographs 11/28/2018. IMPRESSION: 1. No evidence of metastatic disease within the chest, abdomen  or pelvis. No primary malignancy identified. 2. Sequela of prior granulomatous disease. 3. Chronic T9 and L2 compression deformities. 4. Aortic Atherosclerosis (ICD10-I70.0) and Emphysema (ICD10-J43.9). Electronically Signed   By: Richardean Sale M.D.   On: 03/09/2020 08:06    Assessment/Plan Chronic obstructive pulmonary disease,  Patient has cough but her lungs are clear no wheezing She has started smoking again We will treat her symptomatically She is not going to agree for any aggressive measures  Essential hypertension Blood pressure controlled on amlodipine Coreg and Cozaar Amlodipine is a new medication  added  Malignant neoplasm of frontal lobe [facility]) Patient does not want any aggressive measures. Is enrolled in hospice now Continue supportive care  Generalized convulsive epilepsy ([place]) On antiepileptic drugs Diarrhea Does have a history of diarrhea Has refused to give stool sample before.  Would not pursue it unless it gets worse.  Will continue Imodium as needed    Family/ staff Communication:   Labs/tests ordered: Total  time spent in this patient care encounter was  _45  minutes; greater than 50% of the visit spent counseling patient and staff, reviewing records , Labs and coordinating care for problems addressed at this encounter.

## 2020-03-28 ENCOUNTER — Other Ambulatory Visit: Payer: Medicare Other

## 2020-04-03 ENCOUNTER — Non-Acute Institutional Stay (SKILLED_NURSING_FACILITY): Payer: Medicare Other | Admitting: Internal Medicine

## 2020-04-03 ENCOUNTER — Encounter: Payer: Self-pay | Admitting: Internal Medicine

## 2020-04-03 DIAGNOSIS — I10 Essential (primary) hypertension: Secondary | ICD-10-CM | POA: Diagnosis not present

## 2020-04-03 DIAGNOSIS — G40309 Generalized idiopathic epilepsy and epileptic syndromes, not intractable, without status epilepticus: Secondary | ICD-10-CM

## 2020-04-03 DIAGNOSIS — J449 Chronic obstructive pulmonary disease, unspecified: Secondary | ICD-10-CM | POA: Diagnosis not present

## 2020-04-03 DIAGNOSIS — C711 Malignant neoplasm of frontal lobe: Secondary | ICD-10-CM

## 2020-04-03 NOTE — Progress Notes (Signed)
Location: [facility] Room Number: 29-A Place of Service:  SNF [phone])  Provider: Veleta Miners, MD  Code Status: DNR Goals of Care:  Advanced Directives 04/03/2020  Does Patient Have a Medical Advance Directive? Yes  Type of Advance Directive Out of facility DNR (pink MOST or yellow form)  Does patient want to make changes to medical advance directive? No - Patient declined  Copy of [facility] in Chart? -  Would patient like information on creating a medical advance directive? -  Pre-existing out of facility DNR order (yellow form or pink MOST form) Yellow form placed in chart (order not valid for inpatient use)     Chief Complaint  Patient presents with  . Acute Visit    Patient is seen for change in status    HPI: Patient is a 86 y.o. female seen today for an acute visit for change in status Patient was admitted in the hospital from 6/9-6/15 for right frontoparietal intracranial hemorrhage consistent with metastatic brain tumor possibility due to melanoma.  Patient has a history of colon cancer, metastatic melanoma, atrial fibrillation, hypertension, COPD, seizures on antiepileptics and smoking.  After  Patient is enrolled in hospice. Over the weekend she has become more weak.  Unable to feed herself and is having nurses help her with her ADLs including changing her diaper.  This is all new for the patient.  She is also coughing more and is short of breath. Hospice nurse has started patient on Ativan and morphine She was still alert today but had more slurred speech.  Her son was in the room.  She said she does not want nebulizes with the facemask on her face.  But otherwise she did not have any complaints.   Past Medical History:  Diagnosis Date  . Alcohol abuse 11/11/2010  . Alcohol abuse, unspecified   . Anxiety   . Atrial fibrillation ([place]) 11/11/2010  . Bone infection of left hand ([place]) 12/22/2008   secondary to injury  . Cancer  ([place])    colon cancer-hx of chemo  . Chronic airway obstruction, not elsewhere classified 10/01/2011  . Confusion   . COPD (chronic obstructive pulmonary disease) ([place])   . Dizziness and giddiness 04/24/2005  . Edema 12/03/2011  . Embolism - blood clot 1996   left breast  . Generalized convulsive epilepsy without mention of intractable epilepsy   . GERD (gastroesophageal reflux disease)   . Hemorrhoids   . Hyperlipidemia   . Hypertension   . Insomnia, unspecified 11/11/2010  . Major depressive disorder, single episode, unspecified 11/11/2010  . Malignant neoplasm of stomach, unspecified site 11/11/2010  . Melanoma ([place])    metastatic  . Peripheral vascular disease ([place])   . Seizures ([place])   . Symptomatic menopausal or female climacteric states 11/11/2010  . Tachycardia, unspecified   . Tobacco use disorder   . Urgency of urination     Past Surgical History:  Procedure Laterality Date  . ABDOMINAL HYSTERECTOMY  1963  . BLADDER REPAIR    . breast biopsies     x2 on left, x 1 on right  . c-sections  1957, 1959, 1961   x3  . COLON RESECTION  1995  . COLONOSCOPY  10/21/2012   Dr. Paulita Fujita diverticulosis, sessile polyps biopsy   . heart ablation  1997  . HEMORRHOID SURGERY    . laser surgery to right eye  10/04/2007  . left femoral artery stent  05/07/2004  . melanoma removed  from left groin  09/2004  . melanoma removed from left leg  09/06/2004  . orif left humerus fracture  03/27/2005  . porta cath placement  1995  . porta cath removal  1996  . removal of tumor in left knee  11/15/2010  . skin graft to left leg  12/05/2004  . VOCAL CORD INJECTION  1996, 1997    Allergies  Allergen Reactions  . Aspirin Nausea And Vomiting  . Effexor [Venlafaxine Hcl]     Seizure  . Keflex [Cephalexin]   . Levaquin [Levofloxacin]   . Venlafaxine Other (See Comments)    seizures  . Penicillins Rash    Has patient had a PCN reaction causing immediate rash, facial/tongue/throat swelling, SOB or  lightheadedness with hypotension: Yes Has patient had a PCN reaction causing severe rash involving mucus membranes or skin necrosis: No Has patient had a PCN reaction that required hospitalization: No Has patient had a PCN reaction occurring within the last 10 years: No If all of the above answers are "NO", then may proceed with Cephalosporin use.   Azucena Fallen [Azithromycin] Rash    Outpatient Encounter Medications as of 04/03/2020  Medication Sig  . amLODipine (NORVASC) 10 MG tablet Take 1 tablet (10 mg total) by mouth daily.  Marland Kitchen esomeprazole (NEXIUM) 40 MG capsule Take 40 mg by mouth daily at 12 noon.  Marland Kitchen guaiFENesin (MUCINEX) 600 MG 12 hr tablet Take 600 mg by mouth 2 (two) times daily as needed.  Marland Kitchen ipratropium (ATROVENT HFA) 17 MCG/ACT inhaler Inhale 2 puffs into the lungs every 6 (six) hours as needed for wheezing.  Marland Kitchen ipratropium-albuterol (DUONEB) 0.5-2.5 (3) MG/3ML SOLN Take 3 mLs by nebulization every 6 (six) hours as needed.  . lamoTRIgine (LAMICTAL) 150 MG tablet Take 1 tablet (150 mg total) by mouth in the morning.  . lamoTRIgine 200 MG TBDP Take 1 tablet (200 mg total) by mouth at bedtime.  Marland Kitchen loperamide (IMODIUM A-D) 2 MG tablet Take 2 mg by mouth every 6 (six) hours as needed for diarrhea or loose stools.  Marland Kitchen LORazepam (ATIVAN) 0.5 MG tablet Take 0.5 mg by mouth every 4 (four) hours as needed for anxiety.  Marland Kitchen losartan (COZAAR) 100 MG tablet Take 100 mg by mouth daily.  . metoprolol succinate (TOPROL-XL) 50 MG 24 hr tablet Take 1 tablet (50 mg total) by mouth daily. Take with or immediately following a meal.  . Morphine Sulfate (MORPHINE CONCENTRATE) 10 mg / 0.5 ml concentrated solution Take 20 mg by mouth every 4 (four) hours as needed for severe pain.  Marland Kitchen zonisamide (ZONEGRAN) 100 MG capsule Take 200 mg by mouth every evening.   . zonisamide (ZONEGRAN) 100 MG capsule Take 100 mg by mouth in the morning.   . [DISCONTINUED] Calcium Carb-Cholecalciferol (CALCIUM 600/VITAMIN D3) 600-800  MG-UNIT TABS Take 1 tablet by mouth daily.  . [DISCONTINUED] dexamethasone (DECADRON) 4 MG tablet 1 tabs QID for 3 days 1 tabs TID for 3 days 1 tabs BID for 3 days 1 tab daily for 3 days  . [DISCONTINUED] LOSARTAN POTASSIUM PO Take 100 mg by mouth daily.   No facility-administered encounter medications on file as of 04/03/2020.    Review of Systems:  Review of Systems  Constitutional: Positive for activity change and appetite change.  HENT: Negative.   Respiratory: Positive for cough and shortness of breath.   Cardiovascular: Negative.   Gastrointestinal: Negative.   Genitourinary: Negative.   Musculoskeletal: Positive for gait problem.  Skin: Negative.   Neurological:  Positive for facial asymmetry, speech difficulty and weakness.  Psychiatric/Behavioral: Negative.     Health Maintenance  Topic Date Due  . FOOT EXAM  Never done  . OPHTHALMOLOGY EXAM  Never done  . HEMOGLOBIN A1C  12/06/2018  . INFLUENZA VACCINE  04/29/2020  . TETANUS/TDAP  05/05/2029  . DEXA SCAN  Completed  . COVID-19 Vaccine  Completed  . PNA vac Low Risk Adult  Completed    Physical Exam: Vitals:   04/03/20 1603  BP: 120/62  Pulse: 85  Resp: 20  Temp: (!) 97.5 F (36.4 C)  TempSrc: Oral  SpO2: 93%  Weight: 89 lb 3.2 oz (40.5 kg)  Height: 5\' 4"  (1.626 m)   Body mass index is 15.31 kg/m. Physical Exam Vitals reviewed.  HENT:     Head: Normocephalic.     Nose: Nose normal.     Mouth/Throat:     Mouth: Mucous membranes are moist.     Pharynx: Oropharynx is clear.  Eyes:     Pupils: Pupils are equal, round, and reactive to light.  Cardiovascular:     Rate and Rhythm: Normal rate.     Pulses: Normal pulses.  Pulmonary:     Effort: Pulmonary effort is normal.     Breath sounds: No rales.  Abdominal:     General: Abdomen is flat. Bowel sounds are normal.     Palpations: Abdomen is soft.  Musculoskeletal:        General: No swelling.     Cervical back: Neck supple.  Skin:    General:  Skin is warm.  Neurological:     Mental Status: She is alert and oriented to person, place, and time.     Comments: Speech is slurred and she is drooling.  Psychiatric:        Mood and Affect: Mood normal.     Labs reviewed: Basic Metabolic Panel: Recent Labs    05/17/19 0000 12/29/19 0000 02/28/20 0000 02/28/20 0000 03/07/20 1921 03/07/20 1930 03/11/20 0330  NA  --    < > 141  --  136 143 141  K  --    < > 4.4   < > 4.0 4.1 4.0  CL  --    < > 105   < > 100 105 107  CO2  --    < > 29*  --  24  --  28  GLUCOSE  --   --   --   --  104* 103* 157*  BUN  --    < > 18  --  22 24* 27*  CREATININE  --    < > 1.3*  --  1.38* 1.40* 1.22*  CALCIUM  --    < > 9.8  --  9.6  --  9.3  TSH 2.21  --   --   --   --   --   --    < > = values in this interval not displayed.   Liver Function Tests: Recent Labs    04/26/19 0000 04/26/19 0000 12/29/19 0000 02/22/20 0000 03/07/20 1921  AST 15   < > 14 15 20   ALT 7   < > 9 10 16   ALKPHOS 76   < > 82 89 89  BILITOT  --   --   --   --  0.4  PROT 6.4  --   --   --  7.1  ALBUMIN 3.8  --  3.4* 4.1 4.0   < > =  values in this interval not displayed.   No results for input(s): LIPASE, AMYLASE in the last 8760 hours. No results for input(s): AMMONIA in the last 8760 hours. CBC: Recent Labs    12/29/19 0000 12/29/19 0000 02/22/20 0000 03/07/20 1921 03/07/20 1930  WBC 7.1  --  6.9 8.3  --   NEUTROABS  --   --  5,285 5.8  --   HGB 13.3   < > 13.5 13.7 14.6  HCT 41   < > 42 44.6 43.0  MCV  --   --   --  92.7  --   PLT 244  --  264 255  --    < > = values in this interval not displayed.   Lipid Panel: No results for input(s): CHOL, HDL, LDLCALC, TRIG, CHOLHDL, LDLDIRECT in the last 8760 hours. Lab Results  Component Value Date   HGBA1C 5.7 (H) 06/07/2018    Procedures since last visit: CT CHEST W CONTRAST  Result Date: 03/09/2020 CLINICAL DATA:  Hemorrhagic brain mass. Staging. Per previous radiology reports, history of melanoma  and carcinoid tumor. EXAM: CT CHEST, ABDOMEN, AND PELVIS WITH CONTRAST TECHNIQUE: Multidetector CT imaging of the chest, abdomen and pelvis was performed following the standard protocol during bolus administration of intravenous contrast. CONTRAST:  29mL OMNIPAQUE IOHEXOL 300 MG/ML  SOLN COMPARISON:  PET-CT 10/31/2010.  Chest CTA 10/10/2011. FINDINGS: CT CHEST FINDINGS Cardiovascular: Extensive atherosclerosis of the aorta, great vessels and coronary arteries. No acute vascular findings are identified. There are calcifications of the aortic valve. The heart size is normal. There is no pericardial effusion. Mediastinum/Nodes: There are no enlarged mediastinal, hilar or axillary lymph nodes. Small mediastinal and hilar lymph nodes are stable, some calcified. The thyroid gland, esophagus and trachea demonstrate no significant findings. Lungs/Pleura: No pleural effusion or pneumothorax. Moderate centrilobular emphysema with stable biapical scarring and scattered subpleural reticulation. There is a calcified right upper lobe granuloma. The aeration of the lung bases has improved compared with the prior study. No suspicious pulmonary nodules. Musculoskeletal/Chest wall: No chest wall mass or suspicious osseous findings. Inferior endplate compression deformity at T9 is new compared with prior chest CTA, but does not appear acute. CT ABDOMEN AND PELVIS FINDINGS Hepatobiliary: The liver is normal in density without suspicious focal abnormality. No evidence of gallstones, gallbladder wall thickening or biliary dilatation. Pancreas: Unremarkable. No pancreatic ductal dilatation or surrounding inflammatory changes. Spleen: Normal in size without suspicious focal abnormality. Scattered small calcified granulomas are noted. Adrenals/Urinary Tract: Mild thickening of the left adrenal gland appears similar to previous PET-CT. The right adrenal gland appears normal. Bilateral renal vascular calcifications and probable  nephrolithiasis. No evidence of ureteral calculus, hydronephrosis or renal mass. The bladder appears normal. Stomach/Bowel: No evidence of bowel wall thickening, distention or surrounding inflammatory change. Moderate stool throughout the colon. No bowel lesions identified. Vascular/Lymphatic: There are no enlarged abdominal or pelvic lymph nodes. There is extensive aortic and branch vessel atherosclerosis with mild dilatation of the abdominal aorta to 2.3 cm. No focal aneurysm or acute vascular findings. The portal, superior mesenteric and splenic veins are patent. Reproductive: Hysterectomy.  No adnexal mass. Other: No evidence of abdominal wall mass or hernia. No ascites. Musculoskeletal: No acute or significant osseous findings. Interval superior endplate compression deformity at L2 does not appear acute. The T9 and L2 compression deformities are visible on chest radiographs 11/28/2018. IMPRESSION: 1. No evidence of metastatic disease within the chest, abdomen or pelvis. No primary malignancy identified. 2. Sequela of prior  granulomatous disease. 3. Chronic T9 and L2 compression deformities. 4. Aortic Atherosclerosis (ICD10-I70.0) and Emphysema (ICD10-J43.9). Electronically Signed   By: Richardean Sale M.D.   On: 03/09/2020 08:06   MR BRAIN WO CONTRAST  Result Date: 03/07/2020 CLINICAL DATA:  86 year old female with left side deficits, suspected stroke. History of colon, ovarian, and melanoma malignancy reported. This is an outpatient who was scanned after hours, office of Dr. Marcial Pacas was contacted but is closed. EXAM: MRI HEAD WITHOUT CONTRAST TECHNIQUE: Multiplanar, multiecho pulse sequences of the brain and surrounding structures were obtained without intravenous contrast. COMPARISON:  Head CT 11/28/2018.  Brain MRI 05/20/2017. FINDINGS: Brain: Oval intra-axial masslike area of heterogeneous signal measuring about 46 x 35 x 33 mm (AP by transverse by CC) is centered at the right frontal operculum. There  are internal areas of mixed hyperintense and hypointense T1/T2 and FLAIR signal. Some of the areas are restricted on diffusion (series 6 image 67) although there is no surrounding regional diffusion restriction. Much of the area is dark on susceptibility weighted imaging consistent with hemorrhage. There is surrounding T2 and FLAIR hyperintensity in a vasogenic edema pattern. There is no intraventricular extension of blood. No definite extra-axial extension of blood. Mass effect on the right lateral ventricle with no significant midline shift at this time (series 11, image 16). Basilar cisterns remain normal. There is no additional area of similar signal abnormality in the brain. And on SWI imaging no additional cerebral blood products are identified. No ventriculomegaly. Patchy, mild to moderate for age other nonspecific bilateral cerebral white matter T2 and FLAIR hyperintensity with no definite cortical encephalomalacia. T2 heterogeneity throughout the deep gray matter nuclei is chronic and more resembles perivascular spaces but a degree of chronic lacunar infarcts are also possible. Unchanged since 2018 small chronic infarct in the left cerebellum (series 10, image 8). Cervicomedullary junction and pituitary are within normal limits. Vascular: Major intracranial vascular flow voids are stable since 2018. Skull and upper cervical spine: Negative for age visible cervical spine. Visualized bone marrow signal is within normal limits. Sinuses/Orbits: Postoperative changes to both globes, otherwise negative orbits. Paranasal Visualized paranasal sinuses and mastoids are stable and well pneumatized. Other: Visible internal auditory structures appear normal. Scalp and face soft tissues appear negative. IMPRESSION: 1. Solitary intra-axial hemorrhage versus hemorrhagic mass at the right operculum measuring up to 4.6 cm (volume 27 mL). Regional vasogenic edema and mild mass effect with no midline shift at this time. No  extra-axial or intraventricular extension of blood. There is no larger surrounding area of ischemia to strongly suggest a hemorrhagic infarct, and this is indeterminate for a small vessel versus tumor related bleed. Post-contrast MRI images would be valuable, although a repeat MRI without and with contrast once the hematoma has resolved would be most specific. 2. Evidence of chronic small vessel disease otherwise stable since the 2018 MRI. I recommended this patient be seen in an Emergency Department, and her family was agreeable to transport her by private vehicle from [place] to the nearby [facility] ED. In anticipation of the patient's arrival, salient findings were discussed by telephone with the [facility] ED Charge Nurse on 03/07/2020 at 18:33 . Electronically Signed   By: Genevie Ann M.D.   On: 03/07/2020 18:42   MR BRAIN W CONTRAST  Result Date: 03/08/2020 CLINICAL DATA:  Intracranial mass EXAM: MRI HEAD WITH CONTRAST TECHNIQUE: Multiplanar, multiecho pulse sequences of the brain and surrounding structures were obtained with intravenous contrast. CONTRAST:  4.70mL GADAVIST GADOBUTROL 1  MMOL/ML IV SOLN COMPARISON:  Brain MRI without contrast 03/07/2020 FINDINGS: Unchanged size of hemorrhage centered at the right frontoparietal junction. There is an area of nodular contrast enhancement within the posterior aspect of the hemorrhage that measures 1.6 x 1.3 cm (series 7, image 37). No other site of abnormal contrast enhancement. The degree of surrounding edema is unchanged. No midline shift. IMPRESSION: 1. Unchanged size of hemorrhage centered at the right frontoparietal junction. There is an area of nodular contrast enhancement within the posterior aspect of the hemorrhage, likely indicating the presence of tumor. Follow-up study when the hematoma has had time to resolve is suggested. 2. No other contrast-enhancing lesion. Electronically Signed   By: Ulyses Jarred M.D.   On: 03/08/2020 03:34   CT ABDOMEN PELVIS W  CONTRAST  Result Date: 03/09/2020 CLINICAL DATA:  Hemorrhagic brain mass. Staging. Per previous radiology reports, history of melanoma and carcinoid tumor. EXAM: CT CHEST, ABDOMEN, AND PELVIS WITH CONTRAST TECHNIQUE: Multidetector CT imaging of the chest, abdomen and pelvis was performed following the standard protocol during bolus administration of intravenous contrast. CONTRAST:  15mL OMNIPAQUE IOHEXOL 300 MG/ML  SOLN COMPARISON:  PET-CT 10/31/2010.  Chest CTA 10/10/2011. FINDINGS: CT CHEST FINDINGS Cardiovascular: Extensive atherosclerosis of the aorta, great vessels and coronary arteries. No acute vascular findings are identified. There are calcifications of the aortic valve. The heart size is normal. There is no pericardial effusion. Mediastinum/Nodes: There are no enlarged mediastinal, hilar or axillary lymph nodes. Small mediastinal and hilar lymph nodes are stable, some calcified. The thyroid gland, esophagus and trachea demonstrate no significant findings. Lungs/Pleura: No pleural effusion or pneumothorax. Moderate centrilobular emphysema with stable biapical scarring and scattered subpleural reticulation. There is a calcified right upper lobe granuloma. The aeration of the lung bases has improved compared with the prior study. No suspicious pulmonary nodules. Musculoskeletal/Chest wall: No chest wall mass or suspicious osseous findings. Inferior endplate compression deformity at T9 is new compared with prior chest CTA, but does not appear acute. CT ABDOMEN AND PELVIS FINDINGS Hepatobiliary: The liver is normal in density without suspicious focal abnormality. No evidence of gallstones, gallbladder wall thickening or biliary dilatation. Pancreas: Unremarkable. No pancreatic ductal dilatation or surrounding inflammatory changes. Spleen: Normal in size without suspicious focal abnormality. Scattered small calcified granulomas are noted. Adrenals/Urinary Tract: Mild thickening of the left adrenal gland  appears similar to previous PET-CT. The right adrenal gland appears normal. Bilateral renal vascular calcifications and probable nephrolithiasis. No evidence of ureteral calculus, hydronephrosis or renal mass. The bladder appears normal. Stomach/Bowel: No evidence of bowel wall thickening, distention or surrounding inflammatory change. Moderate stool throughout the colon. No bowel lesions identified. Vascular/Lymphatic: There are no enlarged abdominal or pelvic lymph nodes. There is extensive aortic and branch vessel atherosclerosis with mild dilatation of the abdominal aorta to 2.3 cm. No focal aneurysm or acute vascular findings. The portal, superior mesenteric and splenic veins are patent. Reproductive: Hysterectomy.  No adnexal mass. Other: No evidence of abdominal wall mass or hernia. No ascites. Musculoskeletal: No acute or significant osseous findings. Interval superior endplate compression deformity at L2 does not appear acute. The T9 and L2 compression deformities are visible on chest radiographs 11/28/2018. IMPRESSION: 1. No evidence of metastatic disease within the chest, abdomen or pelvis. No primary malignancy identified. 2. Sequela of prior granulomatous disease. 3. Chronic T9 and L2 compression deformities. 4. Aortic Atherosclerosis (ICD10-I70.0) and Emphysema (ICD10-J43.9). Electronically Signed   By: Richardean Sale M.D.   On: 03/09/2020 08:06   VAS US  CAROTID  Result Date: 03/06/2020 Carotid Arterial Duplex Study Indications:       Numbness. Risk Factors:      Hypertension, hyperlipidemia, PAD. Comparison Study:  No prior study Performing Technologist: Maudry Mayhew MHA, RDMS, RVT, RDCS  Examination Guidelines: A complete evaluation includes B-mode imaging, spectral Doppler, color Doppler, and power Doppler as needed of all accessible portions of each vessel. Bilateral testing is considered an integral part of a complete examination. Limited examinations for reoccurring indications may be  performed as noted.  Right Carotid Findings: +----------+--------+-------+--------+--------------------------------+--------+           PSV cm/sEDV    StenosisPlaque Description              Comments                   cm/s                                                    +----------+--------+-------+--------+--------------------------------+--------+ CCA Prox  78      9                                                       +----------+--------+-------+--------+--------------------------------+--------+ CCA Distal61      12             smooth, heterogenous and                                                  calcific                                 +----------+--------+-------+--------+--------------------------------+--------+ ICA Prox  59      11             smooth, heterogenous and                                                  calcific                                 +----------+--------+-------+--------+--------------------------------+--------+ ICA Distal76      17                                                      +----------+--------+-------+--------+--------------------------------+--------+ ECA       166     14             heterogenous                             +----------+--------+-------+--------+--------------------------------+--------+ +----------+--------+-------+----------------+-------------------+  PSV cm/sEDV cmsDescribe        Arm Pressure (mmHG) +----------+--------+-------+----------------+-------------------+ [id number]             Multiphasic, WNL                    +----------+--------+-------+----------------+-------------------+ +---------+--------+--+--------+--+---------+ VertebralPSV cm/s77EDV cm/s16Antegrade +---------+--------+--+--------+--+---------+  Left Carotid Findings: +----------+-------+-------+--------+---------------------------------+--------+           PSV    EDV     StenosisPlaque Description               Comments           cm/s   cm/s                                                     +----------+-------+-------+--------+---------------------------------+--------+ CCA Prox  80     12                                                       +----------+-------+-------+--------+---------------------------------+--------+ CCA Distal51     13             smooth and heterogenous                   +----------+-------+-------+--------+---------------------------------+--------+ ICA Prox  55     12             heterogenous, irregular and                                               calcific                                  +----------+-------+-------+--------+---------------------------------+--------+ ICA Distal117    32                                                       +----------+-------+-------+--------+---------------------------------+--------+ ECA       65     5                                                        +----------+-------+-------+--------+---------------------------------+--------+ +----------+--------+--------+----------------+-------------------+           PSV cm/sEDV cm/sDescribe        Arm Pressure (mmHG) +----------+--------+--------+----------------+-------------------+ [id number]              Multiphasic, WNL                    +----------+--------+--------+----------------+-------------------+ +---------+--------+--+--------+-+---------+ VertebralPSV cm/s40EDV cm/s7Antegrade +---------+--------+--+--------+-+---------+   Summary: Right Carotid: Velocities in the right ICA are consistent with a 1-39% stenosis. Left Carotid: Velocities in the left ICA are consistent with a 1-39% stenosis. Vertebrals:  Bilateral  vertebral arteries demonstrate antegrade flow. Subclavians: Normal flow hemodynamics were seen in bilateral subclavian              arteries. *See table(s) above for  measurements and observations.  Electronically signed by Deitra Mayo MD on 03/06/2020 at 4:05:55 PM.    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 Discussed with the son in the room   Labs/tests ordered:  * No order type specified * Next appt:  Visit date not found

## 2020-04-17 ENCOUNTER — Ambulatory Visit: Payer: Medicare Other | Admitting: Neurology

## 2020-04-29 DEATH — deceased

## 2020-08-13 ENCOUNTER — Ambulatory Visit: Payer: Medicare Other | Admitting: Neurology
